# Patient Record
Sex: Female | Born: 1945 | Race: Black or African American | Hispanic: No | Smoking: Never smoker
Health system: Southern US, Community
[De-identification: ages and names within clinical notes are randomized; demographics above are authoritative.]

## PROBLEM LIST (undated history)

## (undated) DIAGNOSIS — J40 Bronchitis, not specified as acute or chronic: Secondary | ICD-10-CM

## (undated) DIAGNOSIS — J329 Chronic sinusitis, unspecified: Secondary | ICD-10-CM

## (undated) DIAGNOSIS — T7840XA Allergy, unspecified, initial encounter: Secondary | ICD-10-CM

## (undated) DIAGNOSIS — R51 Headache: Secondary | ICD-10-CM

## (undated) DIAGNOSIS — M152 Bouchard's nodes (with arthropathy): Secondary | ICD-10-CM

## (undated) DIAGNOSIS — J449 Chronic obstructive pulmonary disease, unspecified: Secondary | ICD-10-CM

## (undated) DIAGNOSIS — E785 Hyperlipidemia, unspecified: Secondary | ICD-10-CM

## (undated) DIAGNOSIS — M1710 Unilateral primary osteoarthritis, unspecified knee: Secondary | ICD-10-CM

## (undated) DIAGNOSIS — H269 Unspecified cataract: Secondary | ICD-10-CM

## (undated) DIAGNOSIS — H409 Unspecified glaucoma: Secondary | ICD-10-CM

## (undated) DIAGNOSIS — G709 Myoneural disorder, unspecified: Secondary | ICD-10-CM

## (undated) DIAGNOSIS — F419 Anxiety disorder, unspecified: Secondary | ICD-10-CM

## (undated) DIAGNOSIS — K589 Irritable bowel syndrome without diarrhea: Secondary | ICD-10-CM

## (undated) DIAGNOSIS — F32A Depression, unspecified: Secondary | ICD-10-CM

## (undated) DIAGNOSIS — K219 Gastro-esophageal reflux disease without esophagitis: Secondary | ICD-10-CM

## (undated) DIAGNOSIS — F329 Major depressive disorder, single episode, unspecified: Secondary | ICD-10-CM

## (undated) DIAGNOSIS — I10 Essential (primary) hypertension: Secondary | ICD-10-CM

## (undated) DIAGNOSIS — M179 Osteoarthritis of knee, unspecified: Secondary | ICD-10-CM

## (undated) DIAGNOSIS — M47817 Spondylosis without myelopathy or radiculopathy, lumbosacral region: Secondary | ICD-10-CM

## (undated) HISTORY — DX: Spondylosis without myelopathy or radiculopathy, lumbosacral region: M47.817

## (undated) HISTORY — DX: Unspecified cataract: H26.9

## (undated) HISTORY — DX: Osteoarthritis of knee, unspecified: M17.9

## (undated) HISTORY — DX: Major depressive disorder, single episode, unspecified: F32.9

## (undated) HISTORY — DX: Unspecified glaucoma: H40.9

## (undated) HISTORY — DX: Anxiety disorder, unspecified: F41.9

## (undated) HISTORY — DX: Essential (primary) hypertension: I10

## (undated) HISTORY — DX: Irritable bowel syndrome, unspecified: K58.9

## (undated) HISTORY — DX: Myoneural disorder, unspecified: G70.9

## (undated) HISTORY — DX: Depression, unspecified: F32.A

## (undated) HISTORY — DX: Bouchard's nodes (with arthropathy): M15.2

## (undated) HISTORY — PX: TONSILLECTOMY: SHX5217

## (undated) HISTORY — DX: Chronic obstructive pulmonary disease, unspecified: J44.9

## (undated) HISTORY — DX: Chronic sinusitis, unspecified: J32.9

## (undated) HISTORY — PX: CATARACT EXTRACTION: SUR2

## (undated) HISTORY — DX: Allergy, unspecified, initial encounter: T78.40XA

## (undated) HISTORY — DX: Headache: R51

## (undated) HISTORY — DX: Unilateral primary osteoarthritis, unspecified knee: M17.10

## (undated) HISTORY — DX: Gastro-esophageal reflux disease without esophagitis: K21.9

## (undated) HISTORY — DX: Hyperlipidemia, unspecified: E78.5

## (undated) HISTORY — PX: ORIF ANKLE FRACTURE: SHX5408

## (undated) HISTORY — DX: Bronchitis, not specified as acute or chronic: J40

---

## 1975-05-14 HISTORY — PX: TUBAL LIGATION: SHX77

## 1996-05-13 HISTORY — PX: BREAST EXCISIONAL BIOPSY: SUR124

## 1998-05-13 HISTORY — PX: SEPTOPLASTY: SHX2393

## 1999-05-02 ENCOUNTER — Emergency Department (HOSPITAL_COMMUNITY): Admission: EM | Admit: 1999-05-02 | Discharge: 1999-05-02 | Payer: Self-pay | Admitting: Emergency Medicine

## 2000-03-06 ENCOUNTER — Encounter: Payer: Self-pay | Admitting: Internal Medicine

## 2000-03-06 ENCOUNTER — Encounter: Admission: RE | Admit: 2000-03-06 | Discharge: 2000-03-06 | Payer: Self-pay | Admitting: Internal Medicine

## 2000-12-31 ENCOUNTER — Other Ambulatory Visit: Admission: RE | Admit: 2000-12-31 | Discharge: 2000-12-31 | Payer: Self-pay | Admitting: Obstetrics and Gynecology

## 2001-03-09 ENCOUNTER — Encounter: Admission: RE | Admit: 2001-03-09 | Discharge: 2001-03-09 | Payer: Self-pay | Admitting: Internal Medicine

## 2001-03-09 ENCOUNTER — Encounter: Payer: Self-pay | Admitting: Internal Medicine

## 2002-01-01 ENCOUNTER — Other Ambulatory Visit: Admission: RE | Admit: 2002-01-01 | Discharge: 2002-01-01 | Payer: Self-pay | Admitting: Obstetrics and Gynecology

## 2002-03-10 ENCOUNTER — Encounter: Admission: RE | Admit: 2002-03-10 | Discharge: 2002-03-10 | Payer: Self-pay | Admitting: Internal Medicine

## 2002-03-10 ENCOUNTER — Encounter: Payer: Self-pay | Admitting: Internal Medicine

## 2002-05-14 LAB — HM COLONOSCOPY

## 2003-01-05 ENCOUNTER — Other Ambulatory Visit: Admission: RE | Admit: 2003-01-05 | Discharge: 2003-01-05 | Payer: Self-pay | Admitting: Obstetrics and Gynecology

## 2003-03-11 ENCOUNTER — Encounter: Admission: RE | Admit: 2003-03-11 | Discharge: 2003-03-11 | Payer: Self-pay | Admitting: Obstetrics and Gynecology

## 2003-05-14 HISTORY — PX: OTHER SURGICAL HISTORY: SHX169

## 2003-09-12 ENCOUNTER — Ambulatory Visit (HOSPITAL_COMMUNITY): Admission: RE | Admit: 2003-09-12 | Discharge: 2003-09-12 | Payer: Self-pay | Admitting: Allergy

## 2004-02-15 ENCOUNTER — Other Ambulatory Visit: Admission: RE | Admit: 2004-02-15 | Discharge: 2004-02-15 | Payer: Self-pay | Admitting: Obstetrics and Gynecology

## 2004-03-14 ENCOUNTER — Ambulatory Visit: Payer: Self-pay | Admitting: Psychology

## 2004-03-26 ENCOUNTER — Encounter: Admission: RE | Admit: 2004-03-26 | Discharge: 2004-03-26 | Payer: Self-pay | Admitting: Obstetrics and Gynecology

## 2004-03-28 ENCOUNTER — Ambulatory Visit: Payer: Self-pay | Admitting: Psychology

## 2004-04-25 ENCOUNTER — Ambulatory Visit: Payer: Self-pay | Admitting: Psychology

## 2004-05-08 ENCOUNTER — Ambulatory Visit: Payer: Self-pay | Admitting: Internal Medicine

## 2004-05-15 ENCOUNTER — Ambulatory Visit: Payer: Self-pay | Admitting: Internal Medicine

## 2004-05-21 ENCOUNTER — Ambulatory Visit: Payer: Self-pay

## 2004-05-21 ENCOUNTER — Encounter: Payer: Self-pay | Admitting: Internal Medicine

## 2004-05-22 ENCOUNTER — Ambulatory Visit: Payer: Self-pay | Admitting: Internal Medicine

## 2004-05-23 ENCOUNTER — Ambulatory Visit: Payer: Self-pay | Admitting: Psychology

## 2004-06-05 ENCOUNTER — Ambulatory Visit: Payer: Self-pay | Admitting: Internal Medicine

## 2004-07-06 ENCOUNTER — Ambulatory Visit: Payer: Self-pay | Admitting: Internal Medicine

## 2004-11-06 ENCOUNTER — Ambulatory Visit: Payer: Self-pay | Admitting: Psychology

## 2004-11-06 ENCOUNTER — Ambulatory Visit: Payer: Self-pay | Admitting: Internal Medicine

## 2004-12-19 ENCOUNTER — Ambulatory Visit: Payer: Self-pay | Admitting: Psychology

## 2005-05-14 ENCOUNTER — Ambulatory Visit: Payer: Self-pay | Admitting: Internal Medicine

## 2005-05-17 ENCOUNTER — Other Ambulatory Visit: Admission: RE | Admit: 2005-05-17 | Discharge: 2005-05-17 | Payer: Self-pay | Admitting: Internal Medicine

## 2005-05-17 ENCOUNTER — Encounter: Payer: Self-pay | Admitting: Internal Medicine

## 2005-05-17 ENCOUNTER — Ambulatory Visit: Payer: Self-pay | Admitting: Internal Medicine

## 2005-05-27 ENCOUNTER — Ambulatory Visit: Payer: Self-pay | Admitting: Internal Medicine

## 2005-06-04 ENCOUNTER — Ambulatory Visit: Payer: Self-pay | Admitting: Internal Medicine

## 2005-08-23 ENCOUNTER — Encounter: Admission: RE | Admit: 2005-08-23 | Discharge: 2005-08-23 | Payer: Self-pay | Admitting: Internal Medicine

## 2005-10-08 ENCOUNTER — Ambulatory Visit: Payer: Self-pay | Admitting: Internal Medicine

## 2006-01-10 ENCOUNTER — Ambulatory Visit: Payer: Self-pay | Admitting: Internal Medicine

## 2006-02-10 ENCOUNTER — Encounter: Payer: Self-pay | Admitting: Internal Medicine

## 2006-02-10 LAB — CONVERTED CEMR LAB

## 2006-02-26 ENCOUNTER — Other Ambulatory Visit: Admission: RE | Admit: 2006-02-26 | Discharge: 2006-02-26 | Payer: Self-pay | Admitting: Obstetrics and Gynecology

## 2006-05-12 ENCOUNTER — Ambulatory Visit: Payer: Self-pay | Admitting: Internal Medicine

## 2006-05-12 LAB — CONVERTED CEMR LAB
ALT: 20 units/L (ref 0–40)
AST: 20 units/L (ref 0–37)
Albumin: 3.4 g/dL — ABNORMAL LOW (ref 3.5–5.2)
Alkaline Phosphatase: 60 units/L (ref 39–117)
BUN: 8 mg/dL (ref 6–23)
Bacteria, U Microscopic: NEGATIVE /hpf
Basophils Absolute: 0 10*3/uL (ref 0.0–0.1)
Basophils Relative: 0.4 % (ref 0.0–1.0)
Bilirubin Urine: NEGATIVE
CO2: 28 meq/L (ref 19–32)
Calcium: 8.7 mg/dL (ref 8.4–10.5)
Chloride: 109 meq/L (ref 96–112)
Chol/HDL Ratio, serum: 3.9
Cholesterol: 181 mg/dL (ref 0–200)
Creatinine, Ser: 1.2 mg/dL (ref 0.4–1.2)
Crystals: NEGATIVE
Eosinophil percent: 3.5 % (ref 0.0–5.0)
GFR calc non Af Amer: 49 mL/min
Glomerular Filtration Rate, Af Am: 59 mL/min/{1.73_m2}
Glucose, Bld: 96 mg/dL (ref 70–99)
HCT: 38.1 % (ref 36.0–46.0)
HDL: 46 mg/dL (ref >39.0)
Hemoglobin: 12 g/dL (ref 12.0–15.0)
Ketones, ur: NEGATIVE mg/dL
LDL Cholesterol: 124 mg/dL — ABNORMAL HIGH (ref 0–99)
Lymphocytes Relative: 41.1 % (ref 12.0–46.0)
MCHC: 31.6 g/dL (ref 30.0–36.0)
MCV: 75.6 fL — ABNORMAL LOW (ref 78.0–100.0)
Monocytes Absolute: 0.5 10*3/uL (ref 0.2–0.7)
Monocytes Relative: 12.2 % — ABNORMAL HIGH (ref 3.0–11.0)
Mucus, UA: NEGATIVE
Neutro Abs: 1.9 10*3/uL (ref 1.4–7.7)
Neutrophils Relative %: 42.8 % — ABNORMAL LOW (ref 43.0–77.0)
Nitrite: NEGATIVE
Platelets: 201 10*3/uL (ref 150–400)
Potassium: 3.9 meq/L (ref 3.5–5.1)
RBC / HPF: NONE SEEN
RBC: 5.04 M/uL (ref 3.87–5.11)
RDW: 14.8 % — ABNORMAL HIGH (ref 11.5–14.6)
Sodium: 141 meq/L (ref 135–145)
Specific Gravity, Urine: 1.015 (ref 1.000–1.03)
TSH: 2.36 microintl units/mL (ref 0.35–5.50)
Total Bilirubin: 0.6 mg/dL (ref 0.3–1.2)
Total Protein, Urine: NEGATIVE mg/dL
Total Protein: 6.7 g/dL (ref 6.0–8.3)
Triglyceride fasting, serum: 55 mg/dL (ref 0–149)
Urine Glucose: NEGATIVE mg/dL
Urobilinogen, UA: 0.2 (ref 0.0–1.0)
VLDL: 11 mg/dL (ref 0–40)
WBC: 4.4 10*3/uL — ABNORMAL LOW (ref 4.5–10.5)
pH: 6.5 (ref 5.0–8.0)

## 2006-05-19 ENCOUNTER — Ambulatory Visit: Payer: Self-pay | Admitting: Internal Medicine

## 2006-06-30 ENCOUNTER — Ambulatory Visit: Payer: Self-pay | Admitting: Psychology

## 2006-07-23 ENCOUNTER — Ambulatory Visit: Payer: Self-pay | Admitting: Psychology

## 2006-09-18 ENCOUNTER — Ambulatory Visit: Payer: Self-pay | Admitting: Psychology

## 2006-11-03 ENCOUNTER — Ambulatory Visit: Payer: Self-pay | Admitting: Internal Medicine

## 2006-11-03 LAB — CONVERTED CEMR LAB
BUN: 11 mg/dL (ref 6–23)
Basophils Absolute: 0 10*3/uL (ref 0.0–0.1)
Basophils Relative: 0.4 % (ref 0.0–1.0)
CO2: 29 meq/L (ref 19–32)
Calcium: 9.3 mg/dL (ref 8.4–10.5)
Chloride: 111 meq/L (ref 96–112)
Cholesterol: 192 mg/dL (ref 0–200)
Creatinine, Ser: 1.1 mg/dL (ref 0.4–1.2)
Eosinophils Absolute: 0.1 10*3/uL (ref 0.0–0.6)
Eosinophils Relative: 2.2 % (ref 0.0–5.0)
GFR calc Af Amer: 65 mL/min
GFR calc non Af Amer: 54 mL/min
Glucose, Bld: 89 mg/dL (ref 70–99)
HCT: 39.4 % (ref 36.0–46.0)
HDL: 49.4 mg/dL (ref >39.0)
Hemoglobin: 12.6 g/dL (ref 12.0–15.0)
LDL Cholesterol: 126 mg/dL — ABNORMAL HIGH (ref 0–99)
Lymphocytes Relative: 28.9 % (ref 12.0–46.0)
MCHC: 32 g/dL (ref 30.0–36.0)
MCV: 73.4 fL — ABNORMAL LOW (ref 78.0–100.0)
Monocytes Absolute: 0.5 10*3/uL (ref 0.2–0.7)
Monocytes Relative: 9.8 % (ref 3.0–11.0)
Neutro Abs: 3.2 10*3/uL (ref 1.4–7.7)
Neutrophils Relative %: 58.7 % (ref 43.0–77.0)
Platelets: 242 10*3/uL (ref 150–400)
Potassium: 4.1 meq/L (ref 3.5–5.1)
RBC: 5.37 M/uL — ABNORMAL HIGH (ref 3.87–5.11)
RDW: 14.1 % (ref 11.5–14.6)
Sodium: 143 meq/L (ref 135–145)
Total CHOL/HDL Ratio: 3.9
Triglycerides: 83 mg/dL (ref 0–149)
VLDL: 17 mg/dL (ref 0–40)
WBC: 5.3 10*3/uL (ref 4.5–10.5)

## 2006-11-05 ENCOUNTER — Ambulatory Visit: Payer: Self-pay | Admitting: Psychology

## 2006-12-01 ENCOUNTER — Encounter: Payer: Self-pay | Admitting: Internal Medicine

## 2006-12-01 DIAGNOSIS — R519 Headache, unspecified: Secondary | ICD-10-CM | POA: Insufficient documentation

## 2006-12-01 DIAGNOSIS — M19049 Primary osteoarthritis, unspecified hand: Secondary | ICD-10-CM | POA: Insufficient documentation

## 2006-12-01 DIAGNOSIS — J309 Allergic rhinitis, unspecified: Secondary | ICD-10-CM | POA: Insufficient documentation

## 2006-12-01 DIAGNOSIS — M1990 Unspecified osteoarthritis, unspecified site: Secondary | ICD-10-CM | POA: Insufficient documentation

## 2006-12-01 DIAGNOSIS — K589 Irritable bowel syndrome without diarrhea: Secondary | ICD-10-CM | POA: Insufficient documentation

## 2006-12-01 DIAGNOSIS — J329 Chronic sinusitis, unspecified: Secondary | ICD-10-CM | POA: Insufficient documentation

## 2006-12-01 DIAGNOSIS — M19079 Primary osteoarthritis, unspecified ankle and foot: Secondary | ICD-10-CM | POA: Insufficient documentation

## 2006-12-01 DIAGNOSIS — R51 Headache: Secondary | ICD-10-CM | POA: Insufficient documentation

## 2006-12-01 DIAGNOSIS — I10 Essential (primary) hypertension: Secondary | ICD-10-CM | POA: Insufficient documentation

## 2006-12-24 ENCOUNTER — Encounter: Admission: RE | Admit: 2006-12-24 | Discharge: 2006-12-24 | Payer: Self-pay | Admitting: Internal Medicine

## 2007-02-16 ENCOUNTER — Encounter: Payer: Self-pay | Admitting: Internal Medicine

## 2007-02-16 ENCOUNTER — Ambulatory Visit: Payer: Self-pay | Admitting: Internal Medicine

## 2007-04-22 ENCOUNTER — Ambulatory Visit: Payer: Self-pay | Admitting: Internal Medicine

## 2007-04-24 ENCOUNTER — Telehealth: Payer: Self-pay | Admitting: Internal Medicine

## 2007-07-16 ENCOUNTER — Ambulatory Visit: Payer: Self-pay | Admitting: Internal Medicine

## 2007-07-16 LAB — CONVERTED CEMR LAB
ALT: 25 units/L (ref 0–35)
AST: 26 units/L (ref 0–37)
Albumin: 3.7 g/dL (ref 3.5–5.2)
Alkaline Phosphatase: 72 units/L (ref 39–117)
BUN: 14 mg/dL (ref 6–23)
Basophils Absolute: 0 10*3/uL (ref 0.0–0.1)
Basophils Relative: 0.8 % (ref 0.0–1.0)
Bilirubin Urine: NEGATIVE
Bilirubin, Direct: 0.1 mg/dL (ref 0.0–0.3)
CO2: 27 meq/L (ref 19–32)
Calcium: 9 mg/dL (ref 8.4–10.5)
Chloride: 108 meq/L (ref 96–112)
Cholesterol: 182 mg/dL (ref 0–200)
Creatinine, Ser: 1.3 mg/dL — ABNORMAL HIGH (ref 0.4–1.2)
Eosinophils Absolute: 0.2 10*3/uL (ref 0.0–0.6)
Eosinophils Relative: 4 % (ref 0.0–5.0)
GFR calc Af Amer: 54 mL/min
GFR calc non Af Amer: 44 mL/min
Glucose, Bld: 98 mg/dL (ref 70–99)
HCT: 40.6 % (ref 36.0–46.0)
HDL: 57.7 mg/dL (ref >39.0)
Hemoglobin, Urine: NEGATIVE
Hemoglobin: 12.6 g/dL (ref 12.0–15.0)
Ketones, ur: NEGATIVE mg/dL
LDL Cholesterol: 110 mg/dL — ABNORMAL HIGH (ref 0–99)
Leukocytes, UA: NEGATIVE
Lymphocytes Relative: 28.4 % (ref 12.0–46.0)
MCHC: 31 g/dL (ref 30.0–36.0)
MCV: 73.6 fL — ABNORMAL LOW (ref 78.0–100.0)
Monocytes Absolute: 0.6 10*3/uL (ref 0.2–0.7)
Monocytes Relative: 11.7 % — ABNORMAL HIGH (ref 3.0–11.0)
Neutro Abs: 2.6 10*3/uL (ref 1.4–7.7)
Neutrophils Relative %: 55.1 % (ref 43.0–77.0)
Nitrite: NEGATIVE
Platelets: 217 10*3/uL (ref 150–400)
Potassium: 3.9 meq/L (ref 3.5–5.1)
RBC: 5.52 M/uL — ABNORMAL HIGH (ref 3.87–5.11)
RDW: 14.6 % (ref 11.5–14.6)
Sodium: 142 meq/L (ref 135–145)
Specific Gravity, Urine: 1.01 (ref 1.000–1.03)
TSH: 1.8 microintl units/mL (ref 0.35–5.50)
Total Bilirubin: 0.7 mg/dL (ref 0.3–1.2)
Total CHOL/HDL Ratio: 3.2
Total Protein, Urine: NEGATIVE mg/dL
Total Protein: 6.9 g/dL (ref 6.0–8.3)
Triglycerides: 72 mg/dL (ref 0–149)
Urine Glucose: NEGATIVE mg/dL
Urobilinogen, UA: 0.2 (ref 0.0–1.0)
VLDL: 14 mg/dL (ref 0–40)
WBC: 4.8 10*3/uL (ref 4.5–10.5)
pH: 6 (ref 5.0–8.0)

## 2007-07-23 ENCOUNTER — Encounter: Payer: Self-pay | Admitting: Internal Medicine

## 2007-07-23 ENCOUNTER — Other Ambulatory Visit: Admission: RE | Admit: 2007-07-23 | Discharge: 2007-07-23 | Payer: Self-pay | Admitting: Internal Medicine

## 2007-07-23 ENCOUNTER — Ambulatory Visit: Payer: Self-pay | Admitting: Internal Medicine

## 2007-07-29 ENCOUNTER — Encounter: Payer: Self-pay | Admitting: Internal Medicine

## 2007-10-26 ENCOUNTER — Telehealth: Payer: Self-pay | Admitting: Internal Medicine

## 2007-10-27 ENCOUNTER — Ambulatory Visit: Payer: Self-pay | Admitting: Internal Medicine

## 2007-10-27 DIAGNOSIS — J209 Acute bronchitis, unspecified: Secondary | ICD-10-CM | POA: Insufficient documentation

## 2007-10-27 DIAGNOSIS — J45909 Unspecified asthma, uncomplicated: Secondary | ICD-10-CM | POA: Insufficient documentation

## 2007-12-03 ENCOUNTER — Ambulatory Visit: Payer: Self-pay | Admitting: Internal Medicine

## 2007-12-04 DIAGNOSIS — F411 Generalized anxiety disorder: Secondary | ICD-10-CM | POA: Insufficient documentation

## 2007-12-10 ENCOUNTER — Ambulatory Visit: Payer: Self-pay | Admitting: Pulmonary Disease

## 2007-12-10 DIAGNOSIS — K219 Gastro-esophageal reflux disease without esophagitis: Secondary | ICD-10-CM | POA: Insufficient documentation

## 2007-12-25 ENCOUNTER — Encounter: Admission: RE | Admit: 2007-12-25 | Discharge: 2007-12-25 | Payer: Self-pay | Admitting: Internal Medicine

## 2007-12-29 ENCOUNTER — Encounter: Payer: Self-pay | Admitting: Internal Medicine

## 2008-01-04 ENCOUNTER — Encounter: Admission: RE | Admit: 2008-01-04 | Discharge: 2008-01-04 | Payer: Self-pay | Admitting: Gastroenterology

## 2008-01-04 ENCOUNTER — Encounter: Payer: Self-pay | Admitting: Internal Medicine

## 2008-01-27 ENCOUNTER — Encounter: Payer: Self-pay | Admitting: Internal Medicine

## 2008-02-23 ENCOUNTER — Ambulatory Visit: Payer: Self-pay | Admitting: Internal Medicine

## 2008-04-14 ENCOUNTER — Telehealth: Payer: Self-pay | Admitting: Internal Medicine

## 2008-07-18 ENCOUNTER — Ambulatory Visit: Payer: Self-pay | Admitting: Internal Medicine

## 2008-07-18 LAB — CONVERTED CEMR LAB
ALT: 23 units/L (ref 0–35)
AST: 30 units/L (ref 0–37)
Albumin: 3.8 g/dL (ref 3.5–5.2)
Alkaline Phosphatase: 68 units/L (ref 39–117)
BUN: 10 mg/dL (ref 6–23)
Bacteria, UA: NEGATIVE
Basophils Absolute: 0 10*3/uL (ref 0.0–0.1)
Basophils Relative: 0.9 % (ref 0.0–3.0)
Bilirubin Urine: NEGATIVE
Bilirubin, Direct: 0.1 mg/dL (ref 0.0–0.3)
CO2: 29 meq/L (ref 19–32)
Calcium: 9 mg/dL (ref 8.4–10.5)
Chloride: 107 meq/L (ref 96–112)
Cholesterol: 191 mg/dL (ref 0–200)
Creatinine, Ser: 1.1 mg/dL (ref 0.4–1.2)
Crystals: NEGATIVE
Eosinophils Absolute: 0.1 10*3/uL (ref 0.0–0.7)
Eosinophils Relative: 3.2 % (ref 0.0–5.0)
GFR calc Af Amer: 65 mL/min
GFR calc non Af Amer: 53 mL/min
Glucose, Bld: 84 mg/dL (ref 70–99)
HCT: 38.8 % (ref 36.0–46.0)
HDL: 49 mg/dL (ref >39.0)
Hemoglobin, Urine: NEGATIVE
Hemoglobin: 12.7 g/dL (ref 12.0–15.0)
Ketones, ur: NEGATIVE mg/dL
LDL Cholesterol: 130 mg/dL — ABNORMAL HIGH (ref 0–99)
Lymphocytes Relative: 43.5 % (ref 12.0–46.0)
MCHC: 32.7 g/dL (ref 30.0–36.0)
MCV: 73.1 fL — ABNORMAL LOW (ref 78.0–100.0)
Monocytes Absolute: 0.5 10*3/uL (ref 0.1–1.0)
Monocytes Relative: 11.5 % (ref 3.0–12.0)
Mucus, UA: NEGATIVE
Neutro Abs: 1.8 10*3/uL (ref 1.4–7.7)
Neutrophils Relative %: 40.9 % — ABNORMAL LOW (ref 43.0–77.0)
Nitrite: NEGATIVE
Platelets: 203 10*3/uL (ref 150–400)
Potassium: 4 meq/L (ref 3.5–5.1)
RBC: 5.31 M/uL — ABNORMAL HIGH (ref 3.87–5.11)
RDW: 14 % (ref 11.5–14.6)
Sodium: 143 meq/L (ref 135–145)
Specific Gravity, Urine: 1.02 (ref 1.000–1.035)
TSH: 1.54 microintl units/mL (ref 0.35–5.50)
Total Bilirubin: 0.8 mg/dL (ref 0.3–1.2)
Total CHOL/HDL Ratio: 3.9
Total Protein, Urine: NEGATIVE mg/dL
Total Protein: 7 g/dL (ref 6.0–8.3)
Triglycerides: 59 mg/dL (ref 0–149)
Urine Glucose: NEGATIVE mg/dL
Urobilinogen, UA: 0.2 (ref 0.0–1.0)
VLDL: 12 mg/dL (ref 0–40)
WBC: 4.3 10*3/uL — ABNORMAL LOW (ref 4.5–10.5)
pH: 6 (ref 5.0–8.0)

## 2008-07-25 ENCOUNTER — Ambulatory Visit: Payer: Self-pay | Admitting: Internal Medicine

## 2008-07-25 ENCOUNTER — Other Ambulatory Visit: Admission: RE | Admit: 2008-07-25 | Discharge: 2008-07-25 | Payer: Self-pay | Admitting: Internal Medicine

## 2008-07-25 ENCOUNTER — Encounter: Payer: Self-pay | Admitting: Internal Medicine

## 2008-07-25 LAB — HM PAP SMEAR

## 2008-08-01 ENCOUNTER — Telehealth: Payer: Self-pay | Admitting: Internal Medicine

## 2008-09-26 ENCOUNTER — Encounter: Payer: Self-pay | Admitting: Internal Medicine

## 2008-10-14 ENCOUNTER — Telehealth: Payer: Self-pay | Admitting: Internal Medicine

## 2008-11-03 ENCOUNTER — Ambulatory Visit: Payer: Self-pay | Admitting: Internal Medicine

## 2008-11-24 ENCOUNTER — Encounter: Admission: RE | Admit: 2008-11-24 | Discharge: 2008-11-24 | Payer: Self-pay | Admitting: Internal Medicine

## 2008-11-28 ENCOUNTER — Telehealth: Payer: Self-pay | Admitting: Internal Medicine

## 2008-11-30 ENCOUNTER — Ambulatory Visit: Payer: Self-pay | Admitting: Internal Medicine

## 2008-11-30 DIAGNOSIS — T887XXA Unspecified adverse effect of drug or medicament, initial encounter: Secondary | ICD-10-CM | POA: Insufficient documentation

## 2008-12-01 ENCOUNTER — Telehealth: Payer: Self-pay | Admitting: Internal Medicine

## 2009-01-04 ENCOUNTER — Telehealth: Payer: Self-pay | Admitting: Internal Medicine

## 2009-01-10 ENCOUNTER — Ambulatory Visit: Payer: Self-pay | Admitting: Internal Medicine

## 2009-01-26 ENCOUNTER — Ambulatory Visit: Payer: Self-pay | Admitting: Internal Medicine

## 2009-03-06 ENCOUNTER — Ambulatory Visit: Payer: Self-pay | Admitting: Internal Medicine

## 2009-04-12 ENCOUNTER — Encounter: Payer: Self-pay | Admitting: Internal Medicine

## 2009-04-24 ENCOUNTER — Telehealth: Payer: Self-pay | Admitting: Internal Medicine

## 2009-04-26 ENCOUNTER — Telehealth: Payer: Self-pay | Admitting: Internal Medicine

## 2009-07-19 ENCOUNTER — Ambulatory Visit: Payer: Self-pay | Admitting: Psychology

## 2009-07-19 ENCOUNTER — Ambulatory Visit: Payer: Self-pay | Admitting: Internal Medicine

## 2009-07-23 LAB — CONVERTED CEMR LAB
ALT: 19 units/L (ref 0–35)
AST: 25 units/L (ref 0–37)
Albumin: 3.9 g/dL (ref 3.5–5.2)
Alkaline Phosphatase: 71 units/L (ref 39–117)
BUN: 9 mg/dL (ref 6–23)
Basophils Absolute: 0 10*3/uL (ref 0.0–0.1)
Basophils Relative: 0.6 % (ref 0.0–3.0)
Bilirubin Urine: NEGATIVE
Bilirubin, Direct: 0.1 mg/dL (ref 0.0–0.3)
CO2: 29 meq/L (ref 19–32)
Calcium: 9.2 mg/dL (ref 8.4–10.5)
Chloride: 110 meq/L (ref 96–112)
Cholesterol: 226 mg/dL — ABNORMAL HIGH (ref 0–200)
Creatinine, Ser: 1.1 mg/dL (ref 0.4–1.2)
Direct LDL: 150.8 mg/dL
Eosinophils Absolute: 0.1 10*3/uL (ref 0.0–0.7)
Eosinophils Relative: 3.1 % (ref 0.0–5.0)
GFR calc non Af Amer: 64.4 mL/min (ref >60)
Glucose, Bld: 92 mg/dL (ref 70–99)
HCT: 39.2 % (ref 36.0–46.0)
HDL: 59.5 mg/dL (ref >39.00)
Hemoglobin, Urine: NEGATIVE
Hemoglobin: 12.1 g/dL (ref 12.0–15.0)
Ketones, ur: NEGATIVE mg/dL
Lymphocytes Relative: 37.7 % (ref 12.0–46.0)
Lymphs Abs: 1.5 10*3/uL (ref 0.7–4.0)
MCHC: 30.9 g/dL (ref 30.0–36.0)
MCV: 75.3 fL — ABNORMAL LOW (ref 78.0–100.0)
Monocytes Absolute: 0.5 10*3/uL (ref 0.1–1.0)
Monocytes Relative: 11.9 % (ref 3.0–12.0)
Neutro Abs: 1.9 10*3/uL (ref 1.4–7.7)
Neutrophils Relative %: 46.7 % (ref 43.0–77.0)
Nitrite: NEGATIVE
Platelets: 207 10*3/uL (ref 150.0–400.0)
Potassium: 3.8 meq/L (ref 3.5–5.1)
RBC: 5.2 M/uL — ABNORMAL HIGH (ref 3.87–5.11)
RDW: 14.9 % — ABNORMAL HIGH (ref 11.5–14.6)
Sodium: 143 meq/L (ref 135–145)
Specific Gravity, Urine: 1.015 (ref 1.000–1.030)
TSH: 2.49 microintl units/mL (ref 0.35–5.50)
Total Bilirubin: 0.5 mg/dL (ref 0.3–1.2)
Total CHOL/HDL Ratio: 4
Total Protein, Urine: NEGATIVE mg/dL
Total Protein: 7.6 g/dL (ref 6.0–8.3)
Triglycerides: 94 mg/dL (ref 0.0–149.0)
Urine Glucose: NEGATIVE mg/dL
Urobilinogen, UA: 0.2 (ref 0.0–1.0)
VLDL: 18.8 mg/dL (ref 0.0–40.0)
WBC: 4 10*3/uL — ABNORMAL LOW (ref 4.5–10.5)
pH: 6 (ref 5.0–8.0)

## 2009-07-26 ENCOUNTER — Ambulatory Visit: Payer: Self-pay | Admitting: Internal Medicine

## 2009-07-27 ENCOUNTER — Telehealth (INDEPENDENT_AMBULATORY_CARE_PROVIDER_SITE_OTHER): Payer: Self-pay | Admitting: *Deleted

## 2009-07-28 ENCOUNTER — Telehealth: Payer: Self-pay | Admitting: Internal Medicine

## 2009-08-14 ENCOUNTER — Telehealth: Payer: Self-pay | Admitting: Internal Medicine

## 2009-08-15 ENCOUNTER — Encounter: Payer: Self-pay | Admitting: Internal Medicine

## 2009-08-15 ENCOUNTER — Ambulatory Visit: Payer: Self-pay | Admitting: Internal Medicine

## 2009-09-25 ENCOUNTER — Encounter: Payer: Self-pay | Admitting: Internal Medicine

## 2009-09-27 ENCOUNTER — Encounter: Payer: Self-pay | Admitting: Internal Medicine

## 2009-09-27 ENCOUNTER — Telehealth: Payer: Self-pay | Admitting: Internal Medicine

## 2009-10-11 ENCOUNTER — Encounter: Payer: Self-pay | Admitting: Internal Medicine

## 2009-11-27 ENCOUNTER — Encounter: Admission: RE | Admit: 2009-11-27 | Discharge: 2009-11-27 | Payer: Self-pay | Admitting: Internal Medicine

## 2009-11-27 LAB — HM MAMMOGRAPHY: HM Mammogram: NEGATIVE

## 2010-01-02 ENCOUNTER — Telehealth: Payer: Self-pay | Admitting: Internal Medicine

## 2010-01-16 ENCOUNTER — Ambulatory Visit: Payer: Self-pay | Admitting: Internal Medicine

## 2010-01-16 LAB — CONVERTED CEMR LAB
Cholesterol: 206 mg/dL — ABNORMAL HIGH (ref 0–200)
Direct LDL: 145.7 mg/dL
HDL: 50.1 mg/dL (ref >39.00)
Total CHOL/HDL Ratio: 4
Triglycerides: 57 mg/dL (ref 0.0–149.0)
VLDL: 11.4 mg/dL (ref 0.0–40.0)

## 2010-01-19 ENCOUNTER — Encounter (INDEPENDENT_AMBULATORY_CARE_PROVIDER_SITE_OTHER): Payer: Self-pay | Admitting: *Deleted

## 2010-01-19 ENCOUNTER — Ambulatory Visit: Payer: Self-pay | Admitting: Internal Medicine

## 2010-01-19 DIAGNOSIS — E789 Disorder of lipoprotein metabolism, unspecified: Secondary | ICD-10-CM | POA: Insufficient documentation

## 2010-01-25 ENCOUNTER — Ambulatory Visit: Payer: Self-pay | Admitting: Internal Medicine

## 2010-01-30 ENCOUNTER — Telehealth: Payer: Self-pay | Admitting: Internal Medicine

## 2010-02-08 ENCOUNTER — Ambulatory Visit: Payer: Self-pay | Admitting: Internal Medicine

## 2010-02-09 ENCOUNTER — Telehealth: Payer: Self-pay | Admitting: Internal Medicine

## 2010-02-22 ENCOUNTER — Encounter (INDEPENDENT_AMBULATORY_CARE_PROVIDER_SITE_OTHER): Payer: Self-pay | Admitting: *Deleted

## 2010-02-23 ENCOUNTER — Ambulatory Visit: Payer: Self-pay | Admitting: Internal Medicine

## 2010-02-28 ENCOUNTER — Ambulatory Visit: Payer: Self-pay | Admitting: Internal Medicine

## 2010-05-21 ENCOUNTER — Telehealth: Payer: Self-pay | Admitting: Internal Medicine

## 2010-06-10 LAB — CONVERTED CEMR LAB

## 2010-06-12 NOTE — Assessment & Plan Note (Signed)
Summary: bp check per md-lb  Nurse Visit   Vital Signs:  Patient profile:   65 year old female BP sitting:   148 / 80  (left arm) Cuff size:   regular CC: Nurse visit for BP check/ ab   Allergies: 1)  ! Bactrim 2)  ! Enalapril Maleate

## 2010-06-12 NOTE — Progress Notes (Signed)
  Phone Note Refill Request Message from:  Fax from Pharmacy on February 09, 2010 10:37 AM  Refills Requested: Medication #1:  ALPRAZOLAM 0.5 MG  TABS 1 by mouth three times a day prn   Last Refilled: 12/01/2008 Fax from [facility] [address], Please Advise refill  Initial call taken by: Ami Bullins CMA,  February 09, 2010 10:38 AM  Follow-up for Phone Call        ok to refill with 5 add'l Follow-up by: Jacques Navy MD,  February 09, 2010 1:01 PM    Prescriptions: ALPRAZOLAM 0.5 MG  TABS (ALPRAZOLAM) 1 by mouth three times a day prn  #90 x 5   Entered by:   Ami Bullins CMA   Authorized by:   Jacques Navy MD   Signed by:   Bill Salinas CMA on 02/09/2010   Method used:   Telephoned to ...       [facility] (retail)       [address]       [place], [place]  [address]       Ph: [phone]       Fax: [phone]   RxID:   [id number]

## 2010-06-12 NOTE — Progress Notes (Signed)
Summary: PA--Omeprazole  Phone Note From Pharmacy   Summary of Call: PA request--Omeprazole. Done via phone and approved x1 year. Initial call taken by: Lucious Groves,  Sep 27, 2009 3:24 PM

## 2010-06-12 NOTE — Assessment & Plan Note (Signed)
Summary: CPX//  JT  $50   Vital Signs:  Patient Profile:   65 Years Old Female Weight:      190 pounds Temp:     98.6 degrees F oral Pulse rate:   68 / minute BP sitting:   146 / 82  (left arm) Cuff size:   large  Vitals Entered By: Zackery Barefoot CMA (July 23, 2007 10:37 AM)                 PCP:  Norins  Chief Complaint:  CPX/PAP.  History of Present Illness: having a seasonal allergy flare. Thought she had a sinus infection, went to [facility] and was tx'd with amoxicillin. She has seen Dr. Corinda Gubler recently for bronchitis treated with Avelox for 10 days. About a week later went to see Dr.Sharma, had a neb treated, solumedrol and 1 week of prednisone and 14 days of Avelox.    Updated Prior Medication List: SYMBICORT 160-4.5 MCG/ACT  AERO (BUDESONIDE-FORMOTEROL FUMARATE) 2 puffs two times a day PULMICORT 0.25 MG/2ML  SUSP (BUDESONIDE (INHALATION)) nebulizer as needed XOPENEX 1.25 MG/3ML  NEBU (LEVALBUTEROL HCL) nebulizer as needed DIOVAN 320 MG  TABS (VALSARTAN) 1 once daily ASTEPRO 137 MCG/SPRAY  SOLN (AZELASTINE HCL) AS DIRECTED ZYRTEC ALLERGY 10 MG  TABS (CETIRIZINE HCL) 1  once daily ACCOLATE 20 MG  TABS (ZAFIRLUKAST) 1 once daily NEXIUM 40 MG  CPDR (ESOMEPRAZOLE MAGNESIUM) 1 two times a day FUROSEMIDE 40 MG  TABS (FUROSEMIDE) 1 once daily ZOLOFT 100 MG  TABS (SERTRALINE HCL) 1 1/2 once daily AEROHIST 8-2.5 MG  TB12 (CHLORPHENIRAMINE-METHSCOP) 1 two times a day NASAREL 29 MCG/ACT  SOLN (FLUNISOLIDE) once daily MOTRIN IB 200 MG  TABS (IBUPROFEN) 800 mg as needed XANAX 0.25 MG  TABS (ALPRAZOLAM) 1 as needed LIBRAX 2.5-5 MG  CAPS (CLIDINIUM-CHLORDIAZEPOXIDE) 1 three times a day * IMITREX as needed PROVENTIL HFA 108 (90 BASE) MCG/ACT  AERS (ALBUTEROL SULFATE) as needed * MULTIVITAMIN once daily * IRON daily * VITAMIN C daily * B12 daily MUCINEX 600 MG  TB12 (GUAIFENESIN) Take 1 tablet by mouth once a day KLOR-CON 8 MEQ TBCR (POTASSIUM CHLORIDE) Take 1 tablet by  mouth once a day SPIRIVA HANDIHALER 18 MCG  CAPS (TIOTROPIUM BROMIDE MONOHYDRATE) every morning  Current Allergies: ! BACTRIM  Past Medical History:    Reviewed history from 12/01/2006 and no changes required:       Allergic rhinitis       Headache       Hypertension  Past Surgical History:    Reviewed history from 12/01/2006 and no changes required:       Lumpectomy       Tubal ligation       Tonsillectomy       septoplasty       transnasal laryngoscopy   Family History:    father - deceased @ 89: CAD/MI fatal, HTN    mother- 49; good health    Neg- beast or colon cancer, DM  Social History:    [name] -BS, has had additional course work    married '71- after 29 years husband left, divorce final in '06    1 son- '74, 1 daughter '76; 1 grandchild    work: Technical sales engineer of [place]    Lives alone    Risk Factors:  Alcohol use:  no Exercise:  no Seatbelt use:  100 %  Mammogram History:     Date of Last Mammogram:  12/24/2006    Results:  Normal Bilateral  Review of Systems       The patient complains of fever, hoarseness, dyspnea on exhertion, and prolonged cough.  The patient denies anorexia, weight loss, decreased hearing, chest pain, syncope, peripheral edema, hemoptysis, abdominal pain, severe indigestion/heartburn, muscle weakness, depression, and breast masses.     Physical Exam  General:     alert, well-developed, well-nourished, well-hydrated, appropriate dress, normal appearance, and overweight-appearing.   Head:     normocephalic, atraumatic, no abnormalities observed, and no abnormalities palpated.   Eyes:     vision grossly intact, pupils equal, pupils round, pupils reactive to light, corneas and lenses clear, and no injection.   Ears:     External ear exam shows no significant lesions or deformities.  Otoscopic examination reveals clear canals, tympanic membranes are intact bilaterally without bulging, retraction, inflammation or discharge. Hearing is  grossly normal bilaterally. Mouth:     Oral mucosa and oropharynx without lesions or exudates.  Teeth in good repair. Neck:     supple, full ROM, no masses, no thyromegaly, no JVD, and no HJR.   Breasts:     skin/areolae normal, no masses, no abnormal thickening, no nipple discharge, and no tenderness.   Lungs:     decreased breath sounds, tight, exp wheeze, no intercostal retractions and no accessory muscle use.   Heart:     normal rate, regular rhythm, no murmur, no gallop, no JVD, no HJR, and no lifts.   Abdomen:     Bowel sounds positive,abdomen soft and non-tender without masses, organomegaly or hernias noted. Genitalia:     Pelvic Exam:        External: normal female genitalia without lesions or masses        Vagina: normal without lesions or masses        Cervix: normal without lesions or masses        Adnexa: normal bimanual exam without masses or fullness        Uterus: normal by palpation        Pap smear: performed Msk:     normal ROM, no joint tenderness, no joint swelling, no joint warmth, no redness over joints, no joint deformities, no joint instability, and no crepitation.   Pulses:     R and L carotid,radial,femoral,dorsalis pedis and posterior tibial pulses are full and equal bilaterally Extremities:     No clubbing, cyanosis, edema, or deformity noted with normal full range of motion of all joints.   Neurologic:     alert & oriented X3, cranial nerves II-XII intact, strength normal in all extremities, sensation intact to light touch, sensation intact to pinprick, gait normal, DTRs symmetrical and normal, and toes down bilaterally on Babinski.   Skin:     Intact without suspicious lesions or rashes Cervical Nodes:     No lymphadenopathy noted Axillary Nodes:     No palpable lymphadenopathy Psych:     Cognition and judgment appear intact. Alert and cooperative with normal attention span and concentration. 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 Refill provided.  Problem # 3:  HYPERTENSION (ICD-401.9)  Her updated medication list for this problem includes:    Diovan 320 Mg Tabs (Valsartan) .Marland KitchenMarland KitchenMarland KitchenMarland Kitchen  1 once daily    Furosemide 40 Mg Tabs (Furosemide) .Marland Kitchen... 1 once daily  BP today: 146/82 Prior BP: 155/85 (04/22/2007)  Labs Reviewed: Creat: 1.3 (07/16/2007) Chol: 182 (07/16/2007)   HDL: 57.7 (07/16/2007)   LDL: 110 (07/16/2007)   TG: 72 (07/16/2007)  BP running a little high. Plan: check BP at home. IF continue to be 140 or great will need to adjust medication.   Problem # 4:  HEADACHE (ICD-784.0) Stable. Refill on imitrex proviced.  Her updated medication list for this problem includes:    Motrin Ib 200 Mg Tabs (Ibuprofen) .Marland KitchenMarland KitchenMarland KitchenMarland Kitchen 800 mg as needed   Problem # 5:  ALLERGIC RHINITIS (ICD-477.9) Seems a little tight today. I have asked that she use her peak flow meter at home today and if in the yellow or red zone to see Dr. [place] Callas.  Her updated medication list for this problem includes:    Astepro 137 Mcg/spray Soln (Azelastine hcl) .Marland Kitchen... As directed    Zyrtec Allergy 10 Mg Tabs (Cetirizine hcl) .Marland Kitchen... 1  once daily    Aerohist 8-2.5 Mg Tb12 (Chlorpheniramine-methscop) .Marland Kitchen... 1 two times a day    Nasarel 29 Mcg/act Soln (Flunisolide) ..... Once daily   Problem # 6:  Screening Cervical Cancer (ICD-V76.2) Normal exam today with lab pending.  Problem # 7:  Screening Breast Cancer (ICD-V76.10) aup to date with mammograms. Normal exam today.  Complete Medication List: 1)  Symbicort 160-4.5 Mcg/act Aero (Budesonide-formoterol fumarate) .... 2 puffs two times a day 2)  Pulmicort 0.25 Mg/37ml Susp  (Budesonide (inhalation)) .... Nebulizer as needed 3)  Xopenex 1.25 Mg/68ml Nebu (Levalbuterol hcl) .... Nebulizer as needed 4)  Diovan 320 Mg Tabs (Valsartan) .Marland Kitchen.. 1 once daily 5)  Astepro 137 Mcg/spray Soln (Azelastine hcl) .... As directed 6)  Zyrtec Allergy 10 Mg Tabs (Cetirizine hcl) .Marland Kitchen.. 1  once daily 7)  Accolate 20 Mg Tabs (Zafirlukast) .Marland Kitchen.. 1 once daily 8)  Nexium 40 Mg Cpdr (Esomeprazole magnesium) .Marland Kitchen.. 1 two times a day 9)  Furosemide 40 Mg Tabs (Furosemide) .Marland Kitchen.. 1 once daily 10)  Zoloft 100 Mg Tabs (Sertraline hcl) .Marland Kitchen.. 1 1/2 once daily 11)  Aerohist 8-2.5 Mg Tb12 (Chlorpheniramine-methscop) .Marland Kitchen.. 1 two times a day 12)  Nasarel 29 Mcg/act Soln (Flunisolide) .... Once daily 13)  Motrin Ib 200 Mg Tabs (Ibuprofen) .... 800 mg as needed 14)  Xanax 0.25 Mg Tabs (Alprazolam) .Marland Kitchen.. 1 as needed 15)  Librax 2.5-5 Mg Caps (Clidinium-chlordiazepoxide) .Marland Kitchen.. 1 three times a day 16)  Imitrex  .... As needed 17)  Proventil Hfa 108 (90 Base) Mcg/act Aers (Albuterol sulfate) .... As needed 18)  Multivitamin  .... Once daily 19)  Iron  .... Daily 20)  Vitamin C  .... Daily 21)  B12  .... Daily 22)  Mucinex 600 Mg Tb12 (Guaifenesin) .... Take 1 tablet by mouth once a day 23)  Klor-con 8 Meq Tbcr (Potassium chloride) .... Take 1 tablet by mouth once a day 24)  Spiriva Handihaler 18 Mcg Caps (Tiotropium bromide monohydrate) .... Every morning     Prescriptions: ACCOLATE 20 MG  TABS (ZAFIRLUKAST) 1 once daily  #30 x 12   Entered and Authorized by:   Jacques Navy MD   Signed by:   Jacques Navy MD on 07/23/2007   Method used:   Print then Give to Patient   RxID:   [id number] XANAX 0.25 MG  TABS (ALPRAZOLAM) 1 as needed  #90 x 5   Entered and Authorized by:  Jacques Navy MD   Signed by:   Jacques Navy MD on 07/23/2007   Method used:   Print then Give to Patient   RxID:   [id number] FAOZHYQ as needed  #6 x 12   Entered and Authorized by:   Jacques Navy MD    Signed by:   Jacques Navy MD on 07/23/2007   Method used:   Print then Give to Patient   RxID:   [id number] LIBRAX 2.5-5 MG  CAPS (CLIDINIUM-CHLORDIAZEPOXIDE) 1 three times a day  #90 x 12   Entered and Authorized by:   Jacques Navy MD   Signed by:   Jacques Navy MD on 07/23/2007   Method used:   Print then Give to Patient   RxID:   [id number]  ]

## 2010-06-12 NOTE — Letter (Signed)
   [facility] [address] [place], [place]  [address] Phone: [phone]      Sep 25, 2009   Jo-Ann Mummert [address] [place], [place] [address]  RE:  LAB RESULTS  Dear  Ms. Schnapp,  The following is an interpretation of your most recent lab tests.  Please take note of any instructions provided or changes to medications that have resulted from your lab work.     Bone density study reveals normal bone density. Repeat study in 5 years   Call or e-mail me if you have questions ([email or web address]).   Sincerely Yours,    Jacques Navy MD

## 2010-06-12 NOTE — Consult Note (Signed)
[facility]   Imported By: Lester [place] 02/12/2008 11:29:44  _____________________________________________________________________  External Attachment:    Type:   Image     Comment:   External Document

## 2010-06-12 NOTE — Progress Notes (Signed)
  Phone Note Outgoing Call   Reason for Call: Discuss lab or test results Summary of Call: please call patient. PAP normal.  Thanks Initial call taken by: Jacques Navy MD,  August 01, 2008 8:37 AM  Follow-up for Phone Call        Pt advised and is requesting copy of OV and labs. Okay to send same?  Follow-up by: Zackery Barefoot CMA,  August 01, 2008 2:24 PM

## 2010-06-12 NOTE — Progress Notes (Signed)
  Phone Note Refill Request Message from:  Fax from Pharmacy on April 26, 2009 9:33 AM  Refills Requested: Medication #1:  IBUPROFEN 800 MG TABS Take 1 tablet by mouth once a day as needed   Last Refilled: 04/14/2008 can pt have refill? Please Advise  Initial call taken by: Ami Bullins CMA,  April 26, 2009 9:33 AM  Follow-up for Phone Call        OK for as needed refills Follow-up by: Jacques Navy MD,  April 26, 2009 1:08 PM    Prescriptions: IBUPROFEN 800 MG TABS (IBUPROFEN) Take 1 tablet by mouth once a day as needed  #90 x 0   Entered by:   Ami Bullins CMA   Authorized by:   Jacques Navy MD   Signed by:   Bill Salinas CMA on 04/26/2009   Method used:   Telephoned to ...       [facility] (retail)       [address]       [place], [place]  [address]       Ph: [phone]       Fax: [phone]   RxID:   [id number]

## 2010-06-12 NOTE — Assessment & Plan Note (Signed)
Summary: DISCUSS MEDS/NWS #   Vital Signs:  Patient profile:   65 year old female Height:      66 inches Weight:      194 pounds BMI:     31.43 O2 Sat:      99 % on Room air Temp:     98.1 degrees F oral Pulse rate:   63 / minute BP sitting:   140 / 94  (left arm) Cuff size:   regular  Vitals Entered By: Alysia Penna (February 08, 2010 4:02 PM)  O2 Flow:  Room air CC: pt here to discuss changing Exforge. Had an allergic reaction. /cp sma Comments Pt has stopped taking Exforge, Medroxyprogesterone, and Estradiol.    Primary Care Provider:  Norins  CC:  pt here to discuss changing Exforge. Had an allergic reaction. /cp sma.  History of Present Illness: Patient returns for BP management. she at the last visit was started on Exforge due to poorly controlled BP. She had multiple side effects: nausea, diarrhea, felt cold. BP dropped to 100/63. She stopped medication and she feels better.   Chart reviewed:              3/16          01/26/09          11/11/08           6/24         3/15         12/09/07                                        142/92        138/84           160/94         160/70     160/110     154/90  Prior to last visit patient was controlled on losartan 100mg  and furosemide 40mg   Current Medications (verified): 1)  Symbicort 160-4.5 Mcg/act  Aero (Budesonide-Formoterol Fumarate) .... 2 Puffs Two Times A Day 2)  Xopenex 1.25 Mg/72ml  Nebu (Levalbuterol Hcl) .... Nebulizer As Needed 3)  Exforge 5-160 Mg Tabs (Amlodipine Besylate-Valsartan) .Marland Kitchen.. 1 By Mouth Once Daily -Hold X 5 Days(01/30/2010) 4)  Astepro 137 Mcg/spray  Soln (Azelastine Hcl) .... As Directed 5)  Xyzal 5 Mg Tabs (Levocetirizine Dihydrochloride) .Marland Kitchen.. 1 Tablet Daily 6)  Accolate 20 Mg  Tabs (Zafirlukast) .... As Needed 7)  Omeprazole 40 Mg Cpdr (Omeprazole) .Marland Kitchen.. 1 By Mouth Once Daily 8)  Zoloft 100 Mg  Tabs (Sertraline Hcl) .Marland Kitchen.. 1 1/2 Once Daily 9)  Fluticasone Propionate 50 Mcg/act Susp (Fluticasone  Propionate) .Marland Kitchen.. 1 Spray/nares Once Daily 10)  Ibuprofen 800 Mg Tabs (Ibuprofen) .... Take 1 Tablet By Mouth Once A Day As Needed 11)  Alprazolam 0.5 Mg  Tabs (Alprazolam) .Marland Kitchen.. 1 By Mouth Three Times A Day Prn 12)  Librax 2.5-5 Mg  Caps (Clidinium-Chlordiazepoxide) .Marland Kitchen.. 1 Three Times A Day 13)  Imitrex .... As Needed 14)  Multivitamin .... Once Daily 15)  Iron .... Daily 16)  Vitamin C .... Daily 17)  B12 .... Daily 18)  Mucinex 600 Mg  Tb12 (Guaifenesin) .... Take 1 Tablet By Mouth Once A Day 19)  Medroxyprogesterone Acetate 2.5 Mg  Tabs (Medroxyprogesterone Acetate) .... Take One By Mouth As Needed 20)  Estradiol 1 Mg  Tabs (Estradiol) .... Take One Tablet By Mouth  Daily. 21)  Xalatan 0.005 % Soln (Latanoprost) .Marland Kitchen.. 1 Drop Both Eyes Daily 22)  Klor-Con 8 Meq Cr-Tabs (Potassium Chloride) .Marland Kitchen.. 1 By Mouth Once Daily 23)  Furosemide 40 Mg Tabs (Furosemide) .Marland Kitchen.. 1 By Mouth Once Daily  Allergies (verified): 1)  ! Bactrim 2)  ! Enalapril Maleate  Past History:  Past Medical History: Last updated: 10/27/2007 Allergic rhinitis Headache Hypertension Asthma Recurrent bronchitis  Past Surgical History: Last updated: 07/26/2009 Lumpectomy Tubal ligation Tonsillectomy septoplasty transnasal laryngoscopy ORIF left ankle (done in [place]) '07 PSH reviewed for relevance, FH reviewed for relevance  Review of Systems  The patient denies anorexia, fever, weight loss, weight gain, decreased hearing, chest pain, dyspnea on exertion, prolonged cough, abdominal pain, muscle weakness, depression, and angioedema.    Physical Exam  General:  overweight AA woman in no distress Head:  normocephalic and no abnormalities observed.   Eyes:  pupils equal and pupils round.   Lungs:  normal respiratory effort, normal breath sounds, and no wheezes.   Heart:  normal rate and regular rhythm.   Neurologic:  alert & oriented X3, cranial nerves II-XII intact, and gait normal.     Impression &  Recommendations:  Problem # 1:  HYPERTENSION (ICD-401.9) Patient intolerant of Exforge. She does well with losartan and furosemide. BP is pretty good today.  Plan - resume losartan 100 and continue furosemide 40mg   Her updated medication list for this problem includes:    Losartan Potassium 100 Mg Tabs (Losartan potassium) .Marland Kitchen... 1 by mouth once daily    Furosemide 40 Mg Tabs (Furosemide) .Marland Kitchen... 1 by mouth once daily  Complete Medication List: 1)  Symbicort 160-4.5 Mcg/act Aero (Budesonide-formoterol fumarate) .... 2 puffs two times a day 2)  Xopenex 1.25 Mg/37ml Nebu (Levalbuterol hcl) .... Nebulizer as needed 3)  Losartan Potassium 100 Mg Tabs (Losartan potassium) .Marland Kitchen.. 1 by mouth once daily 4)  Astepro 137 Mcg/spray Soln (Azelastine hcl) .... As directed 5)  Xyzal 5 Mg Tabs (Levocetirizine dihydrochloride) .Marland Kitchen.. 1 tablet daily 6)  Accolate 20 Mg Tabs (Zafirlukast) .... As needed 7)  Omeprazole 40 Mg Cpdr (Omeprazole) .Marland Kitchen.. 1 by mouth once daily 8)  Zoloft 100 Mg Tabs (Sertraline hcl) .Marland Kitchen.. 1 1/2 once daily 9)  Fluticasone Propionate 50 Mcg/act Susp (Fluticasone propionate) .Marland Kitchen.. 1 spray/nares once daily 10)  Ibuprofen 800 Mg Tabs (Ibuprofen) .... Take 1 tablet by mouth once a day as needed 11)  Alprazolam 0.5 Mg Tabs (Alprazolam) .Marland Kitchen.. 1 by mouth three times a day prn 12)  Librax 2.5-5 Mg Caps (Clidinium-chlordiazepoxide) .Marland Kitchen.. 1 three times a day 13)  Imitrex  .... As needed 14)  Multivitamin  .... Once daily 15)  Iron  .... Daily 16)  Vitamin C  .... Daily 17)  B12  .... Daily 18)  Mucinex 600 Mg Tb12 (Guaifenesin) .... Take 1 tablet by mouth once a day 19)  Medroxyprogesterone Acetate 2.5 Mg Tabs (Medroxyprogesterone acetate) .... Take one by mouth as needed 20)  Estradiol 1 Mg Tabs (Estradiol) .... Take one tablet by mouth daily. 21)  Xalatan 0.005 % Soln (Latanoprost) .Marland Kitchen.. 1 drop both eyes daily 22)  Klor-con 8 Meq Cr-tabs (Potassium chloride) .Marland Kitchen.. 1 by mouth once daily 23)   Furosemide 40 Mg Tabs (Furosemide) .Marland Kitchen.. 1 by mouth once daily

## 2010-06-12 NOTE — Assessment & Plan Note (Signed)
Summary: men/flu/cd  Nurse Visit   Allergies: 1)  ! Bactrim 2)  ! Enalapril Maleate  Orders Added: 1)  Admin 1st Vaccine [90471] 2)  Flu Vaccine 59yrs + [[address]56] Flu Vaccine Consent Questions     Do you have a history of severe allergic reactions to this vaccine? no    Any prior history of allergic reactions to egg and/or gelatin? no    Do you have a sensitivity to the preservative Thimersol? no    Do you have a past history of Guillan-Barre Syndrome? no    Do you currently have an acute febrile illness? no    Have you ever had a severe reaction to latex? no    Vaccine information given and explained to patient? yes    Are you currently pregnant? no    Lot Number:[id number]   Exp Date:11/09/2009   Site Given  Left Deltoid IMu

## 2010-06-12 NOTE — Miscellaneous (Signed)
Summary: [facility] Previsit/prep  Clinical Lists Changes  Medications: Added new medication of DULCOLAX 5 MG  TBEC (BISACODYL) Day before procedure take 2 at 3pm and 2 at 8pm. - Signed Added new medication of METOCLOPRAMIDE HCL 10 MG  TABS (METOCLOPRAMIDE HCL) As per prep instructions. - Signed Added new medication of MIRALAX   POWD (POLYETHYLENE GLYCOL 3350) As per prep  instructions. - Signed Rx of DULCOLAX 5 MG  TBEC (BISACODYL) Day before procedure take 2 at 3pm and 2 at 8pm.;  #4 x 0;  Signed;  Entered by: Wyona Almas RN;  Authorized by: Hart Carwin MD;  Method used: Electronically to [facility] [address], [place], [place], [place]  [address], Ph: [phone], Fax: [phone] Rx of METOCLOPRAMIDE HCL 10 MG  TABS (METOCLOPRAMIDE HCL) As per prep instructions.;  #2 x 0;  Signed;  Entered by: Wyona Almas RN;  Authorized by: Hart Carwin MD;  Method used: Electronically to [facility] [address] [place], [place], [place]  [address], Ph: [phone], Fax: [phone] Rx of MIRALAX   POWD (POLYETHYLENE GLYCOL 3350) As per prep  instructions.;  #255gm x 0;  Signed;  Entered by: Wyona Almas RN;  Authorized by: Hart Carwin MD;  Method used: Electronically to [facility] [address] [place], [place], [place]  [address], Ph: [phone], Fax: [phone] Observations: Added new observation of ALLERGY REV: Done (02/23/2010 13:00)    Prescriptions: MIRALAX   POWD (POLYETHYLENE GLYCOL 3350) As per prep  instructions.  #255gm x 0   Entered by:   Wyona Almas RN   Authorized by:   Hart Carwin MD   Signed by:   Wyona Almas RN on 02/23/2010   Method used:   Electronically to        [facility] (retail)       [address]       [place], [place]  [address]       Ph: [phone]       Fax: [phone]   RxID:   [id number] METOCLOPRAMIDE HCL 10 MG  TABS  (METOCLOPRAMIDE HCL) As per prep instructions.  #2 x 0   Entered by:   Wyona Almas RN   Authorized by:   Hart Carwin MD   Signed by:   Wyona Almas RN on 02/23/2010   Method used:   Electronically to        [facility] (retail)       [address]       [place], [place]  [address]       Ph: [phone]       Fax: [phone]   RxID:   [id number] DULCOLAX 5 MG  TBEC (BISACODYL) Day before procedure take 2 at 3pm and 2 at 8pm.  #4 x 0   Entered by:   Wyona Almas RN   Authorized by:   Hart Carwin MD   Signed by:   Wyona Almas RN on 02/23/2010   Method used:   Electronically to        [facility] (retail)       [address]       [place], [place]  [address]       Ph: [phone]       Fax: [phone]   RxID:  [id number]  

## 2010-06-12 NOTE — Progress Notes (Signed)
Summary: RFs  Phone Note Call from Patient   Caller: Patient Summary of Call: Dr. Debby Bud received an email requesting med refills. Initial call taken by: Zackery Barefoot CMA,  April 14, 2008 6:26 PM  Follow-up for Phone Call        Medications needed refills [facility] to [facility] have been sent. Pt also requesting refills on Zoloft and Alprazolam to [facility] but pt did not advised which [facility]. Left vmail for pt to call back with that information. Follow-up by: Zackery Barefoot CMA,  April 14, 2008 6:36 PM  Additional Follow-up for Phone Call Additional follow up Details #1::        Patient returned call, she wants supply of xanax & zoloft to go to [facility]. OK? Additional Follow-up by: Lamar Sprinkles,  April 20, 2008 8:44 AM    Additional Follow-up for Phone Call Additional follow up Details #2::    OK, loosing insurance coverage Follow-up by: Jacques Navy MD,  April 20, 2008 10:07 AM  New/Updated Medications: IBUPROFEN 800 MG TABS (IBUPROFEN) Take 1 tablet by mouth once a day as needed   Prescriptions: ZOLOFT 100 MG  TABS (SERTRALINE HCL) 1 1/2 once daily  #135 x 3   Entered by:   Lamar Sprinkles   Authorized by:   Jacques Navy MD   Signed by:   Lamar Sprinkles on 04/20/2008   Method used:   Telephoned to ...       [facility] [address]* (retail)       [address]       [place], [place]  [address]       Ph: [phone]       Fax: [phone]   RxID:   [id number] ALPRAZOLAM 0.5 MG  TABS (ALPRAZOLAM) 1 by mouth three times a day prn  #270 x 1   Entered by:   Lamar Sprinkles   Authorized by:   Jacques Navy MD   Signed by:   Lamar Sprinkles on 04/20/2008   Method used:   Telephoned to ...       [facility] [address] (retail)       [address]       [place], [place]  [address]       Ph: [phone]       Fax: [phone]   RxID:   [id number] MEDROXYPROGESTERONE ACETATE 2.5 MG  TABS  (MEDROXYPROGESTERONE ACETATE) Take one by mouth as needed  #90 x 3   Entered by:   Zackery Barefoot CMA   Authorized by:   Jacques Navy MD   Signed by:   Zackery Barefoot CMA on 04/14/2008   Method used:   Electronically to        [facility] (retail)       [address]       [place], [place]  [address]       Ph: [phone]       Fax: [phone]   RxID:   [id number] IBUPROFEN 800 MG TABS (IBUPROFEN) Take 1 tablet by mouth once a day as needed  #90 x 3   Entered by:   Zackery Barefoot CMA   Authorized by:   Jacques Navy MD   Signed by:   Zackery Barefoot CMA on 04/14/2008   Method  used:   Electronically to        [facility] (retail)       [address]       [place], [place]  [address]       Ph: [phone]       Fax: [phone]   RxID:   [id number] ESTRADIOL 1 MG  TABS (ESTRADIOL) Take one tablet by mouth daily.  #90 x 3   Entered by:   Zackery Barefoot CMA   Authorized by:   Jacques Navy MD   Signed by:   Zackery Barefoot CMA on 04/14/2008   Method used:   Electronically to        [facility] (retail)       [address]       [place], [place]  [address]       Ph: [phone]       Fax: [phone]   RxID:   [id number] FUROSEMIDE 40 MG  TABS (FUROSEMIDE) 1 once daily  #90 x 3   Entered by:   Zackery Barefoot CMA   Authorized by:   Jacques Navy MD   Signed by:   Zackery Barefoot CMA on 04/14/2008   Method used:   Electronically to        [facility] (retail)       [address]       [place], [place]  [address]       Ph: [phone]       Fax: [phone]   RxID:   [id number] KLOR-CON 8 MEQ TBCR (POTASSIUM CHLORIDE) Take 1 tablet by mouth once a day  #90 x 3   Entered by:   Zackery Barefoot CMA   Authorized by:   Jacques Navy MD   Signed by:   Zackery Barefoot CMA on  04/14/2008   Method used:   Electronically to        [facility] (retail)       [address]       [place], [place]  [address]       Ph: [phone]       Fax: [phone]   RxID:   [id number]

## 2010-06-12 NOTE — Miscellaneous (Signed)
Summary: BONE DENSITY  Clinical Lists Changes  Orders: Added new Test order of T-Bone Densitometry ([address]) - Signed Added new Test order of T-Lumbar Vertebral Assessment (770[address]) - Signed 

## 2010-06-12 NOTE — Progress Notes (Signed)
Summary: rx request  Phone Note Refill Request Message from:  Pharmacy on January 04, 2009 2:26 PM  Refills Requested: Medication #1:  ZOLOFT 100 MG  TABS 1 1/2 once daily   Last Refilled: 12/05/2008   Notes: rx witten 04-20-2008 #135 Pharmacy requesting refill. Last Office Visit w/ Dr. Debby Bud: 11-30-2008. Okay for refills?  [facility] [id number]  Next Appointment Scheduled: No upcomming Initial call taken by: Beola Cord, CMA,  January 04, 2009 2:30 PM  Follow-up for Phone Call        ok to refill as needed  Follow-up by: Jacques Navy MD,  January 04, 2009 2:57 PM    Prescriptions: ZOLOFT 100 MG  TABS (SERTRALINE HCL) 1 1/2 once daily  #135 x 3   Entered by:   Orlan Leavens   Authorized by:   Jacques Navy MD   Signed by:   Orlan Leavens on 01/04/2009   Method used:   Electronically to        [facility] (retail)       [address]       [place], [place]  [address]       Ph: [phone]       Fax: [phone]   RxID:   [id number]

## 2010-06-12 NOTE — Progress Notes (Signed)
Summary: Appt sched for Bone Density 4/5, labs in [facility] for Sept 2011  ---- Converted from flag ---- ---- 07/26/2009 1:51 PM, Jacques Navy MD wrote: Please: 1) schedule for bone density study 2) lipid panel in 6 months 790.6 Thanks ------------------------------

## 2010-06-12 NOTE — Progress Notes (Signed)
Summary: Furosemide Refill  Phone Note From Pharmacy   Caller: [facility] Summary of Call: Rec'd fax refill request for Furosemide/Klor-Con. ?Ok to refill and how many times? Initial call taken by: Lucious Groves,  April 24, 2009 10:20 AM  Follow-up for Phone Call        OK to refill as needed on both furosemide and potassium Follow-up by: Jacques Navy MD,  April 24, 2009 11:26 AM  Additional Follow-up for Phone Call Additional follow up Details #1::        sent to pharmacy. Additional Follow-up by: Lucious Groves,  April 24, 2009 4:28 PM    New/Updated Medications: FUROSEMIDE 40 MG  TABS (FUROSEMIDE) 1 once daily KLOR-CON 8 MEQ TBCR (POTASSIUM CHLORIDE) Take 1 tablet by mouth once a day Prescriptions: KLOR-CON 8 MEQ TBCR (POTASSIUM CHLORIDE) Take 1 tablet by mouth once a day  #30 x 12   Entered by:   Lucious Groves   Authorized by:   Jacques Navy MD   Signed by:   Lucious Groves on 04/24/2009   Method used:   Electronically to        [facility] (retail)       [address]       [place], [place]  [address]       Ph: [phone]       Fax: [phone]   RxID:   [id number] FUROSEMIDE 40 MG  TABS (FUROSEMIDE) 1 once daily  #30 x 12   Entered by:   Lucious Groves   Authorized by:   Jacques Navy MD   Signed by:   Lucious Groves on 04/24/2009   Method used:   Electronically to        [facility] (retail)       [address]       [place], [place]  [address]       Ph: [phone]       Fax: [phone]   RxID:   [id number]

## 2010-06-12 NOTE — Letter (Signed)
Summary: [facility]  [facility]   Imported By: Lester Lititz 10/24/2009 11:49:53  _____________________________________________________________________  External Attachment:    Type:   Image     Comment:   External Document

## 2010-06-12 NOTE — Assessment & Plan Note (Signed)
Summary: CONGESTION-STC  Medications Added MUCINEX 600 MG  TB12 (GUAIFENESIN) Take 1 tablet by mouth once a day        Vital Signs:  Patient Profile:   65 Years Old Female Weight:      185 pounds Temp:     98.2 degrees F oral Pulse rate:   65 / minute BP sitting:   155 / 85  (left arm) Cuff size:   large  Vitals Entered By: Zackery Barefoot CMA (April 22, 2007 4:25 PM)                 PCP:  Halana Deisher  Chief Complaint:  Flu shot/depression/Paperwork.  History of Present Illness: Patient presents for multiple issues. Being asked to redo FMLA.  Current Allergies: ! BACTRIM        Impression & Recommendations:  Problem # 1:  ENCOUNTERS UNSPECIFIED ADMINISTRATIVE PURPOSE (ICD-V68.9) Completed FMLA forms.  Complete Medication List: 1)  Symbicort 160-4.5 Mcg/act Aero (Budesonide-formoterol fumarate) .... 2 puffs two times a day 2)  Pulmicort 0.25 Mg/105ml Susp (Budesonide (inhalation)) .... Nebulizer as needed 3)  Xopenex 1.25 Mg/30ml Nebu (Levalbuterol hcl) .... Nebulizer as needed 4)  Diovan 320 Mg Tabs (Valsartan) .Marland Kitchen.. 1 once daily 5)  Astelin 137 Mcg/spray Soln (Azelastine hcl) .... Once daily 6)  Zyrtec Allergy 10 Mg Tabs (Cetirizine hcl) .Marland Kitchen.. 1 1/2 once daily 7)  Accolate 20 Mg Tabs (Zafirlukast) .Marland Kitchen.. 1 once daily 8)  Nexium 40 Mg Cpdr (Esomeprazole magnesium) .Marland Kitchen.. 1 two times a day 9)  Furosemide 40 Mg Tabs (Furosemide) .Marland Kitchen.. 1 once daily 10)  Zoloft 100 Mg Tabs (Sertraline hcl) .Marland Kitchen.. 1 1/2 once daily 11)  Aerohist 8-2.5 Mg Tb12 (Chlorpheniramine-methscop) .Marland Kitchen.. 1 two times a day 12)  Nasarel 29 Mcg/act Soln (Flunisolide) .... Once daily 13)  Motrin Ib 200 Mg Tabs (Ibuprofen) .... 800 mg as needed 14)  Xanax 0.25 Mg Tabs (Alprazolam) .Marland Kitchen.. 1 as needed 15)  Librax 2.5-5 Mg Caps (Clidinium-chlordiazepoxide) .Marland Kitchen.. 1 three times a day 16)  Imitrex  .... As needed 17)  Femhrt 1/5 1-5 Mg-mcg Tabs (Norethindrone-eth estradiol) .Marland Kitchen.. 1 once daily 18)  Proventil Hfa 108  (90 Base) Mcg/act Aers (Albuterol sulfate) .... As needed 19)  Multivitamin  .... Once daily 20)  Iron  .... Daily 21)  Vitamin C  .... Daily 22)  B12  .... Daily 23)  Potassium  .... Daily 24)  Mucinex 600 Mg Tb12 (Guaifenesin) .... Take 1 tablet by mouth once a day     ]  Appended Document: Flu shot     Clinical Lists Changes  Orders: Added new Service order of Influenza Vaccine NON MCR (13086) - Signed Observations: Added new observation of FLU VAX#1VIS: 12/04/06 version given April 22, 2007. (04/22/2007 17:17) Added new observation of FLU VAXLOT: [id number] (04/22/2007 17:17) Added new observation of FLU VAX EXP: 11/10/2007 (04/22/2007 17:17) Added new observation of FLU VAXBY: Zackery Barefoot CMA (04/22/2007 17:17) Added new observation of FLU VAXRTE: IM (04/22/2007 17:17) Added new observation of FLU VAX DSE: 0.5 ml (04/22/2007 17:17) Added new observation of FLU VAXMFR: Sanofi Pasteur (04/22/2007 17:17) Added new observation of FLU VAX SITE: left deltoid (04/22/2007 17:17) Added new observation of FLU VAX: Fluvax Non-MCR (04/22/2007 17:17)        Influenza Vaccine    Vaccine Type: Fluvax Non-MCR    Site: left deltoid    Mfr: Sanofi Pasteur    Dose: 0.5 ml    Route: IM    Given by: Shanda Bumps  Roxan Hockey CMA    Exp. Date: 11/10/2007    Lot #: [id number]    VIS given: 12/04/06 version given April 22, 2007.  Flu Vaccine Consent Questions    Do you have a history of severe allergic reactions to this vaccine? no    Any prior history of allergic reactions to egg and/or gelatin? no    Do you have a sensitivity to the preservative Thimersol? no    Do you have a past history of Guillan-Barre Syndrome? no    Do you currently have an acute febrile illness? no    Have you ever had a severe reaction to latex? no    Vaccine information given and explained to patient? yes    Are you currently pregnant? no

## 2010-06-12 NOTE — Assessment & Plan Note (Signed)
Summary: BP CHECK/MEN/CD  Nurse Visit   Vital Signs:  Patient profile:   65 year old female BP sitting:   150 / 82  (left arm)  Vitals Entered By: Lamar Sprinkles, CMA (January 10, 2009 2:43 PM)  Allergies: 1)  ! Bactrim 2)  ! Enalapril Maleate  Orders Added: 1)  Est. Patient Level I [[address]] Prescriptions: COZAAR 100 MG TABS (LOSARTAN POTASSIUM) 1 once daily  #30 x 0   Entered by:   Lamar Sprinkles, CMA   Authorized by:   Jacques Navy MD   Signed by:   Lamar Sprinkles, CMA on 01/10/2009   Method used:   Electronically to        [facility] (retail)       [address]       [place], [place]  [address]       Ph: [phone]       Fax: [phone]   RxID:   [id number]   Dr Debby Bud aware of bp. Per Dr, increase cozaar from 50mg  to 100mg , & follow up in 2 wks. Patient informed, rx sent in.

## 2010-06-12 NOTE — Progress Notes (Signed)
Summary: BP MED PROBLEM?   Phone Note Call from Patient Call back at [facility] Phone [phone]   Summary of Call: Patient is requesting a call back regarding BP medication. She c/o h/a, ear pain, belching & feels "super cold". She feels that this is due to bp med. Does pt need office visit for re-eval? BP at home was 103/66 Initial call taken by: Lamar Sprinkles, CMA,  January 30, 2010 9:57 AM  Follow-up for Phone Call        not necessarily medication. Plan - drug holiday from exforge for 5 days to see if symptoms resolve. iF yes - ov to make med change, if no - resume exforge.  Follow-up by: Jacques Navy MD,  January 30, 2010 12:48 PM  Additional Follow-up for Phone Call Additional follow up Details #1::        Pt informed, she will call office back w/update  Additional Follow-up by: Lamar Sprinkles, CMA,  January 30, 2010 5:43 PM    New/Updated Medications: EXFORGE 5-160 MG TABS (AMLODIPINE BESYLATE-VALSARTAN) 1 by mouth once daily -HOLD X 5 days(01/30/2010)

## 2010-06-12 NOTE — Miscellaneous (Signed)
Summary: PAP  Clinical Lists Changes  Observations: Added new observation of PAP SMEAR: Done (07/23/2007 11:12)       Preventive Care Screening  Pap Smear:    Date:  07/23/2007    Results:  Done

## 2010-06-12 NOTE — Letter (Signed)
    [facility] [address] [place], [place]  [address] Phone: [phone]      July 29, 2007   Halli Rothbauer [address] [place], [place] [address]  RE:  LAB RESULTS  Dear  Ms. Juste,  The following is an interpretation of your most recent lab tests.  Please take note of any instructions provided or changes to medications that have resulted from your lab work.  Pap Smear: normal      Call or e-mail me if you have questions ([email or web address])    Sincerely Yours,    Jacques Navy MD

## 2010-06-12 NOTE — Assessment & Plan Note (Signed)
Summary: chills, cough SD   Vital Signs:  Patient Profile:   65 Years Old Female Weight:      194 pounds Temp:     98.5 degrees F oral Pulse rate:   65 / minute BP sitting:   167 / 86  (left arm) Cuff size:   large  Vitals Entered By: Zackery Barefoot CMA (October 27, 2007 9:14 AM)                 PCP:  Debby Bud  Chief Complaint:  Shakes/productive cough/congestion x 2 weeks/taking Avelox qd from Dr. [place] Callas.  History of Present Illness: patient had a URI in February and she had two courses, 7 days and then 14 days of avelox, prescribed by Dr.  Callas. She was also treated for one month with Spiriva. She did respond. This June 7th, sunday, she had recurrent symptoms congestion, low grade fever, cough productive of purulent sputum, shakes, dizziness. Because of her symptoms she had filled another course of Avelox for 14 days, starting June 7th. She did inform Dr. Lyla Son office of her recurrent episode and that she had started Avelox.   Today she is still short of breath, still coughing with gray sputum, light-headed. She reports using albuterol  once a day, symbicort two times a day, asta-pro (an astelin type drug), flucinolone nasal spray. Reports low grade fever at home.    Updated Prior Medication List: SYMBICORT 160-4.5 MCG/ACT  AERO (BUDESONIDE-FORMOTEROL FUMARATE) 2 puffs two times a day PULMICORT 0.25 MG/2ML  SUSP (BUDESONIDE (INHALATION)) nebulizer as needed XOPENEX 1.25 MG/3ML  NEBU (LEVALBUTEROL HCL) nebulizer as needed DIOVAN 320 MG  TABS (VALSARTAN) 1 once daily ASTEPRO 137 MCG/SPRAY  SOLN (AZELASTINE HCL) AS DIRECTED ZYRTEC ALLERGY 10 MG  TABS (CETIRIZINE HCL) 1  once daily ACCOLATE 20 MG  TABS (ZAFIRLUKAST) 1 once daily NEXIUM 40 MG  CPDR (ESOMEPRAZOLE MAGNESIUM) 1 two times a day FUROSEMIDE 40 MG  TABS (FUROSEMIDE) 1 once daily ZOLOFT 100 MG  TABS (SERTRALINE HCL) 1 1/2 once daily FLONASE 50 MCG/ACT  SUSP (FLUTICASONE PROPIONATE) as directed MOTRIN IB 200 MG  TABS  (IBUPROFEN) 800 mg as needed XANAX 0.25 MG  TABS (ALPRAZOLAM) 1 as needed LIBRAX 2.5-5 MG  CAPS (CLIDINIUM-CHLORDIAZEPOXIDE) 1 three times a day * IMITREX as needed PROVENTIL HFA 108 (90 BASE) MCG/ACT  AERS (ALBUTEROL SULFATE) as needed * MULTIVITAMIN once daily * IRON daily * VITAMIN C daily * B12 daily MUCINEX 600 MG  TB12 (GUAIFENESIN) Take 1 tablet by mouth once a day KLOR-CON 8 MEQ TBCR (POTASSIUM CHLORIDE) Take 1 tablet by mouth once a day  Current Allergies: ! BACTRIM  Past Medical History:    Reviewed history from 12/01/2006 and no changes required:       Allergic rhinitis       Headache       Hypertension       Asthma       Recurrent bronchitis  Past Surgical History:    Reviewed history from 12/01/2006 and no changes required:       Lumpectomy       Tubal ligation       Tonsillectomy       septoplasty       transnasal laryngoscopy   Family History:    Reviewed history from 07/23/2007 and no changes required:       father - deceased @ 39: CAD/MI fatal, HTN       mother- 66; good health       Neg-  beast or colon cancer, DM  Social History:    Reviewed history from 07/23/2007 and no changes required:       [name] -BS, has had additional course work       married '71- after 29 years husband left, divorce final in '06       1 son- '74, 1 daughter '76; 1 grandchild       work: Technical sales engineer of [place]       Lives alone     Review of Systems       The patient complains of fever, hoarseness, chest pain, dyspnea on exertion, prolonged cough, headaches, and severe indigestion/heartburn.  The patient denies anorexia, weight loss, weight gain, peripheral edema, abdominal pain, muscle weakness, difficulty walking, abnormal bleeding, and angioedema.     Physical Exam  General:     alert, well-developed, well-nourished, and well-hydrated.   Head:     normocephalic and atraumatic.   Eyes:     pupils equal, pupils round, pupils reactive to light, corneas and lenses  clear, and no injection.   Neck:     No deformities, masses, or tenderness noted. Chest Wall:     no deformities and no tenderness.   Lungs:     forced respirations against her glotis giving upper airway coarse breath sounds. No accessory muscles fo respiration in use, no wheezing, no rhonchi or sounds of consolidation. O2 sat 99% on room air. Heart:     normal rate and regular rhythm.   Extremities:     No clubbing, cyanosis, edema, or deformity noted with normal full range of motion of all joints.   Neurologic:     alert & oriented X3, cranial nerves II-XII intact, strength normal in all extremities, and sensation intact to light touch.      Impression & Recommendations:  Problem # 1:  ACUTE BRONCHITIS (ICD-466.0) Patient with what appears to be partially treated acute bronchitis. O2 sat is great. Exam shows only upper airway sounds with clear lungs. Chest x-ray is negative for infiltrates or effusions, question of bronchitic changes.  Plan: finish avelox, continue present meds, short course of prednisone 10mg  once daily x 5 for upper airway edema. Call for increasing problems.  The following medications were removed from the medication list:    Spiriva Handihaler 18 Mcg Caps (Tiotropium bromide monohydrate) ..... Every morning  Her updated medication list for this problem includes:    Symbicort 160-4.5 Mcg/act Aero (Budesonide-formoterol fumarate) .Marland Kitchen... 2 puffs two times a day    Pulmicort 0.25 Mg/69ml Susp (Budesonide (inhalation)) ..... Nebulizer as needed    Xopenex 1.25 Mg/18ml Nebu (Levalbuterol hcl) ..... Nebulizer as needed    Accolate 20 Mg Tabs (Zafirlukast) .Marland Kitchen... 1 once daily    Proventil Hfa 108 (90 Base) Mcg/act Aers (Albuterol sulfate) .Marland Kitchen... As needed    Mucinex 600 Mg Tb12 (Guaifenesin) .Marland Kitchen... Take 1 tablet by mouth once a day  Orders: T-2 View CXR, Same Day (71020.5TC)   Complete Medication List: 1)  Symbicort 160-4.5 Mcg/act Aero (Budesonide-formoterol fumarate)  .... 2 puffs two times a day 2)  Pulmicort 0.25 Mg/40ml Susp (Budesonide (inhalation)) .... Nebulizer as needed 3)  Xopenex 1.25 Mg/1ml Nebu (Levalbuterol hcl) .... Nebulizer as needed 4)  Diovan 320 Mg Tabs (Valsartan) .Marland Kitchen.. 1 once daily 5)  Astepro 137 Mcg/spray Soln (Azelastine hcl) .... As directed 6)  Zyrtec Allergy 10 Mg Tabs (Cetirizine hcl) .Marland Kitchen.. 1  once daily 7)  Accolate 20 Mg Tabs (Zafirlukast) .Marland Kitchen.. 1 once daily 8)  Nexium 40 Mg Cpdr (Esomeprazole magnesium) .Marland Kitchen.. 1 two times a day 9)  Furosemide 40 Mg Tabs (Furosemide) .Marland Kitchen.. 1 once daily 10)  Zoloft 100 Mg Tabs (Sertraline hcl) .Marland Kitchen.. 1 1/2 once daily 11)  Flonase 50 Mcg/act Susp (Fluticasone propionate) .... As directed 12)  Motrin Ib 200 Mg Tabs (Ibuprofen) .... 800 mg as needed 13)  Xanax 0.25 Mg Tabs (Alprazolam) .Marland Kitchen.. 1 as needed 14)  Librax 2.5-5 Mg Caps (Clidinium-chlordiazepoxide) .Marland Kitchen.. 1 three times a day 15)  Imitrex  .... As needed 16)  Proventil Hfa 108 (90 Base) Mcg/act Aers (Albuterol sulfate) .... As needed 17)  Multivitamin  .... Once daily 18)  Iron  .... Daily 19)  Vitamin C  .... Daily 20)  B12  .... Daily 21)  Mucinex 600 Mg Tb12 (Guaifenesin) .... Take 1 tablet by mouth once a day 22)  Klor-con 8 Meq Tbcr (Potassium chloride) .... Take 1 tablet by mouth once a day 23)  Prednisone 10 Mg Tabs (Prednisone) .Marland Kitchen.. 1 by mouth once daily    Prescriptions: PREDNISONE 10 MG  TABS (PREDNISONE) 1 by mouth once daily  #5 x 0   Entered and Authorized by:   Jacques Navy MD   Signed by:   Jacques Navy MD on 10/27/2007   Method used:   Electronically sent to ...       [facility]       [address]       [place], [place]  [address]       Ph: [phone]       Fax: [phone]   RxID:   [id number]  ]

## 2010-06-12 NOTE — Assessment & Plan Note (Signed)
Summary: PER PT PHYSICAL---$50---STC   Vital Signs:  Patient profile:   65 year old female Height:      67 inches Weight:      202 pounds BMI:     31.75 Temp:     98.8 degrees F oral Pulse rate:   66 / minute BP sitting:   160 / 110  (left arm) Cuff size:   large  Vitals Entered By: Zackery Barefoot CMA (July 25, 2008 1:59 PM)   Primary Care Provider:  Salina Stanfield   History of Present Illness: Interval history: No real changes in medical conditions. Did have a URI several weeks ago and did go to urgent care and was treated with Augmentin, finishing antibiotics about 10 days ago.   Presents today for routine care.   Asthma History    Initial Asthma Severity Rating:    Age range: 12+ years    Symptoms: 0-2 days/week    Nighttime Awakenings: 0-2/month    Interferes w/ normal activity: some limitations    SABA use (not for EIB): >2 days/week but not >1X/day    Exacerbations requiring oral systemic steroids: 0-1/year    Asthma Severity Assessment: Moderate Persistent  Hypertension History:      She denies headache, chest pain, palpitations, dyspnea with exertion, orthopnea, PND, peripheral edema, neurologic problems, and syncope.        Positive major cardiovascular risk factors include female age 34 years old or older and hypertension.  Negative major cardiovascular risk factors include non-tobacco-user status.      Current Medications (verified): 1)  Symbicort 160-4.5 Mcg/act  Aero (Budesonide-Formoterol Fumarate) .... 2 Puffs Two Times A Day 2)  Pulmicort 0.25 Mg/38ml  Susp (Budesonide (Inhalation)) .... Nebulizer As Needed 3)  Xopenex 1.25 Mg/61ml  Nebu (Levalbuterol Hcl) .... Nebulizer As Needed 4)  Diovan 320 Mg  Tabs (Valsartan) .Marland Kitchen.. 1 Once Daily 5)  Astepro 137 Mcg/spray  Soln (Azelastine Hcl) .... As Directed 6)  Zyrtec Allergy 10 Mg  Tabs (Cetirizine Hcl) .Marland Kitchen.. 1  Once Daily 7)  Accolate 20 Mg  Tabs (Zafirlukast) .Marland Kitchen.. 1 Once Daily 8)  Nexium 40 Mg  Cpdr (Esomeprazole  Magnesium) .Marland Kitchen.. 1 Two Times A Day 9)  Furosemide 40 Mg  Tabs (Furosemide) .Marland Kitchen.. 1 Once Daily 10)  Zoloft 100 Mg  Tabs (Sertraline Hcl) .Marland Kitchen.. 1 1/2 Once Daily 11)  Flonase 50 Mcg/act  Susp (Fluticasone Propionate) .... As Directed 12)  Ibuprofen 800 Mg Tabs (Ibuprofen) .... Take 1 Tablet By Mouth Once A Day As Needed 13)  Alprazolam 0.5 Mg  Tabs (Alprazolam) .Marland Kitchen.. 1 By Mouth Three Times A Day Prn 14)  Librax 2.5-5 Mg  Caps (Clidinium-Chlordiazepoxide) .Marland Kitchen.. 1 Three Times A Day 15)  Imitrex .... As Needed 16)  Proventil Hfa 108 (90 Base) Mcg/act  Aers (Albuterol Sulfate) .... As Needed 17)  Multivitamin .... Once Daily 18)  Iron .... Daily 19)  Vitamin C .... Daily 20)  B12 .... Daily 21)  Mucinex 600 Mg  Tb12 (Guaifenesin) .... Take 1 Tablet By Mouth Once A Day 22)  Klor-Con 8 Meq Tbcr (Potassium Chloride) .... Take 1 Tablet By Mouth Once A Day 23)  Medroxyprogesterone Acetate 2.5 Mg  Tabs (Medroxyprogesterone Acetate) .... Take One By Mouth As Needed 24)  Estradiol 1 Mg  Tabs (Estradiol) .... Take One Tablet By Mouth Daily.  Allergies (verified): 1)  ! 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    Lives alone   Review of Systems       The patient complains of fever and dyspnea on exertion.  The patient denies anorexia, weight loss, weight gain, vision loss, decreased hearing, hoarseness, chest pain, peripheral edema, prolonged cough, hemoptysis, abdominal pain, severe indigestion/heartburn, incontinence, muscle weakness, difficulty walking, abnormal bleeding, and breast masses.    Physical Exam  General:  overwight AA female NAD Head:  Normocephalic and atraumatic without obvious abnormalities. No apparent alopecia or balding. Eyes:  No corneal or conjunctival inflammation noted. EOMI. Perrla. Funduscopic exam benign, without hemorrhages, exudates or papilledema. Vision grossly normal. Ears:  External ear exam shows no significant lesions or deformities.  Otoscopic examination reveals clear canals, tympanic membranes are intact bilaterally without bulging, retraction, inflammation or discharge. Hearing is grossly normal bilaterally. Nose:  no external deformity and no external erythema.   Mouth:  Oral mucosa and oropharynx without lesions or exudates.  Teeth in good repair. Neck:  No deformities, masses, or tenderness noted. Chest Wall:  No deformities, masses, or tenderness noted. Breasts:  No mass, nodules, thickening, tenderness, bulging, retraction, inflamation, nipple discharge or skin changes noted.   Lungs:  Normal respiratory effort, chest expands symmetrically. Lungs are clear to auscultation, no crackles or wheezes. Heart:   Normal rate and regular rhythm. S1 and S2 normal without gallop, murmur, click, rub or other extra sounds. Abdomen:  soft, non-tender, normal bowel sounds, no guarding, and no rigidity.   Genitalia:  Pelvic Exam:        External: normal female genitalia without lesions or masses        Vagina: normal without lesions or masses        Cervix: normal without lesions or masses        Adnexa: normal bimanual exam without masses or fullness        Uterus: normal by palpation        Pap smear: performed Msk:  No deformity or scoliosis noted of thoracic or lumbar spine.   Pulses:  R and L carotid,radial,femoral,dorsalis pedis and posterior tibial pulses are full and equal bilaterally Extremities:  No clubbing, cyanosis, edema, or deformity noted with normal full range of motion of all joints.   Neurologic:  alert & oriented X3, cranial nerves II-XII intact, strength normal in all extremities, sensation intact to light touch, sensation intact to pinprick, gait normal, and DTRs symmetrical and normal.   Skin:  Intact without suspicious lesions or rashes Cervical Nodes:  no anterior cervical adenopathy and no posterior cervical adenopathy.   Psych:  Oriented X3, memory intact for recent and remote, normally interactive, and good eye contact.     Impression & Recommendations:  Problem # 1:  G E REFLUX (ICD-530.81) Stable on PPI therapy and occasional librax.  Plan- continue present medications.  Her updated medication list for this problem includes:    Nexium 40 Mg Cpdr (Esomeprazole magnesium) .Marland Kitchen... 1 two times a day    Librax 2.5-5 Mg Caps (Clidinium-chlordiazepoxide) .Marland Kitchen... 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 She does not appear to be suffering a relapse nor need any additional treatment.  Problem # 3:  ASTHMA (ICD-493.90) Presently stable  having made a good recovery from a bout of bronchitis. She is headed into allergy season which usually will lead to an exacerbation of her symptoms.   Plan - she will continue her present medications.  Her updated medication list for this problem includes:    Symbicort 160-4.5 Mcg/act Aero (Budesonide-formoterol fumarate) .Marland Kitchen... 2 puffs two times a day    Pulmicort 0.25 Mg/29ml Susp (Budesonide (inhalation)) ..... Nebulizer as needed    Xopenex 1.25 Mg/75ml Nebu (Levalbuterol hcl) ..... Nebulizer as needed    Accolate 20 Mg Tabs (Zafirlukast) .Marland Kitchen... 1 once daily    Proventil Hfa 108 (90 Base) Mcg/act Aers (Albuterol sulfate) .Marland Kitchen... As needed  Problem # 4:  HYPERTENSION (ICD-401.9)  Her updated medication list for this problem includes:    Diovan 320 Mg Tabs (Valsartan) .Marland Kitchen... 1 once daily    Furosemide 40 Mg Tabs (Furosemide) .Marland Kitchen... 1 once daily  BP today: 160/110 Prior BP: 146/96 (12/10/2007)  10 Yr Risk Heart Disease: 17 %  Labs Reviewed: Creat: 1.1 (07/18/2008) Chol: 191 (07/18/2008)   HDL: 49.0 (07/18/2008)   LDL: 130 (07/18/2008)   TG: 59 (07/18/2008)  Her blood pressure is poorly controlled at today's visit. She is asymptomatic  Plan: she will monitor BP at home and if she is running SBP great than 140 or DBP greater than 90 on a regular basis she will notify me and medications will be adjusted.   Problem # 5:  ALLERGIC RHINITIS (ICD-477.9) She reports that she has stopped allergy shots. She is eating local honey on a regular basis which does seem to help her symptoms. However, with allergy season approaching she does plan to see her allergist for follow-up.  Her updated medication list for this problem includes:    Astepro 137 Mcg/spray Soln (Azelastine hcl) .Marland Kitchen... As directed    Zyrtec Allergy 10 Mg Tabs (Cetirizine hcl) .Marland Kitchen... 1  once daily    Flonase 50 Mcg/act Susp (Fluticasone propionate) .Marland Kitchen... As directed  Problem # 6:  DEGENERATIVE JOINT DISEASE, KNEES, BILATERAL  (ICD-715.96) Diffuse DJD has been stable and not limiting her activities. No medication side effects  from the ibuprofen. Normal renal function.  Her updated medication list for this problem includes:    Ibuprofen 800 Mg Tabs (Ibuprofen) .Marland Kitchen... Take 1 tablet by mouth once a day as needed  Problem # 7:  Preventive Health Care (ICD-V70.0) General physical exam OK except for weight: plan is life-style managment with smart food choices within her food preferences, portion size restriction and an effort to get regular aerobic exercise - if only walking 3-4 days a week. Goal is to loose 1 lb/month consistently for a target of 50lbs of weight loss over 5 years.   Up-to-date with colorectal cancer screening. Normal well woman exam today, Up-to-date with mammography.  In summary: multiple medical problems with suboptimal control of blood pressure and weight both problems to be addressed with life-style and monitoring. she will need to report back in regard to her blood pressure levels at home so that medications may be adjusted if needed.   Complete Medication List: 1)  Symbicort 160-4.5 Mcg/act Aero (Budesonide-formoterol fumarate) .... 2 puffs two times a day 2)  Pulmicort 0.25 Mg/13ml Susp (Budesonide (inhalation)) .... Nebulizer as needed 3)  Xopenex 1.25 Mg/49ml Nebu (Levalbuterol hcl) .... Nebulizer as needed 4)  Diovan 320 Mg Tabs (Valsartan) .Marland Kitchen.. 1 once daily 5)  Astepro 137 Mcg/spray Soln (Azelastine hcl) .... As directed 6)  Zyrtec Allergy 10 Mg Tabs (Cetirizine hcl) .Marland Kitchen.. 1  once daily 7)  Accolate 20 Mg Tabs (Zafirlukast) .Marland Kitchen.. 1 once daily 8)  Nexium 40 Mg Cpdr (Esomeprazole magnesium) .Marland Kitchen.. 1 two times a day 9)  Furosemide 40 Mg Tabs (Furosemide) .Marland Kitchen.. 1 once daily 10)  Zoloft 100 Mg Tabs (Sertraline hcl) .Marland Kitchen.. 1 1/2 once daily 11)  Flonase 50 Mcg/act Susp (Fluticasone propionate) .... As directed 12)  Ibuprofen 800 Mg Tabs (Ibuprofen) .... Take 1 tablet by mouth once a day as needed 13)   Alprazolam 0.5 Mg Tabs (Alprazolam) .Marland Kitchen.. 1 by mouth three times a day prn 14)  Librax 2.5-5 Mg Caps (Clidinium-chlordiazepoxide) .Marland Kitchen.. 1 three times a day 15)  Imitrex  .... As needed 16)  Proventil Hfa 108 (90 Base) Mcg/act Aers (Albuterol sulfate) .... As needed 17)  Multivitamin  .... Once daily 18)  Iron  .... Daily 19)  Vitamin C  .... Daily 20)  B12  .... Daily 21)  Mucinex 600 Mg Tb12 (Guaifenesin) .... Take 1 tablet by mouth once a day 22)  Klor-con 8 Meq Tbcr (Potassium chloride) .... Take 1 tablet by mouth once a day 23)  Medroxyprogesterone Acetate 2.5 Mg Tabs (Medroxyprogesterone acetate) .... Take one by mouth as needed 24)  Estradiol 1 Mg Tabs (Estradiol) .... Take one tablet by mouth daily.  Hypertension Assessment/Plan:      The patient's hypertensive risk group is category B: At least one risk factor (excluding diabetes) with no target organ damage.  Her calculated 10 year risk of coronary heart disease is 17 %.  Today's blood pressure is 160/110.    Patient: Dominique Johnston Note: All result statuses are Final unless otherwise noted.  Tests: (1) LIPID PROFILE (LIPID)   CHOLESTEROL               191 mg/dL                   3-235       ATP III Classification:             < 200       mg/dL      Desireable  200 - 239    mg/dL      Borderline High            > = 240      mg/dL      High   TRIGLYCERIDES             59 mg/dL                    0-454        Normal:  < 150 mg/dL        Borderline High:  150 - 199 mg/dL   HDL                       09.8 mg/dL                  >11.9   VLDL CHOLESTEROL          12 mg/dl                    1-47   LDL CHOLESTEROL      [H]  829 mg/dl                   5-62  CHOL/HDL Ratio: CHD Risk                             3.9 CALC  Tests: (2) URINE DIPTICK (UDIP)   COLOR                     LT YELLOW                   YELLOW   CLARITY                   Clear                       CLEAR   SP.GRAVITY                1.020                        1.000-1.035   URINE pH                  6.0                         5.0 - 8.0   PROTEIN                   Negative                    NEGATIVE   URINE GLUCOSE             NEGATIVE                    NEGATIVE   KETONES                   NEGATIVE                    NEGATIVE   URINE BILIRUBIN           NEGATIVE                    NEGATIVE   BLOOD  NEGATIVE                    NEGATIVE   UROBILINOGEN              0.2 mg/dL                   1.6-1.0   LEUKOCYTE ESTERACE   [A]  Trace                       NEGATIVE     TRANS EPITH RARE ON MICRO   NITRITE                   Negative                    NEGATIVE  Tests: (3) CBC WITH DIFF (CBCD)   WHITE CELL COUNT     [L]  4.3 K/uL                    4.5-10.5   RED CELL COUNT       [H]  5.31 Mil/uL                 3.87-5.11   HEMOGLOBIN                12.7 g/dL                   96.0-45.4   HEMATOCRIT                38.8 %                      36.0-46.0   MCV                  [L]  73.1 fl                     78.0-100.0   MCHC                      32.7 g/dL                   09.8-11.9   RDW                       14.0 %                      11.5-14.6   PLATELET COUNT            203 K/uL                    150-400   NEUTROPHIL %         [L]  40.9 %                      43.0-77.0   LYMPHOCYTE %              43.5 %                      12.0-46.0   MONOCYTE %                11.5 %                      3.0-12.0   EOSINOPHILS %  3.2 %                       0.0-5.0   BASOPHILS %               0.9 %                       0.0-3.0  NEUTROPHILS, ABSOLUTE                             1.8 K/uL                    1.4-7.7   MONOCYTES, ABSOLUTE       0.5 K/uL                    0.1-1.0  EOSINOPHILS, ABSOLUTE                             0.1 K/uL                    0.0-0.7   BASOPHILS, ABSOLUTE       0.0 K/uL                    0.0-0.1  Tests: (4) URINALYSIS (URINE)   URINE WBC                 0-2                          <3/HPF   URINE RBC                 0-2                         <3/HPF   URINE MUCUS               NEGATIVE   URINE EPITH               RARE   RENAL EPITHELIAL          RARE (C)   URINE BACTERIA            NEGATIVE   HYALINE CASTS             NONE   GRANULAR CASTS            NONE   URINE CRYSTALS            NEGATIVE   URINE TRICHOMONAS         NONE   URINE YEAST               NONE  Tests: (5) BASIC METABOLIC PANEL (METABOL)   SODIUM                    143 mEq/L                   135-145   POTASSIUM                 4.0 mEq/L                   3.5-5.1   CHLORIDE                  107 mEq/L  96-112   CARBON DIOXIDE            29 mEq/L                    19-32   GLUCOSE                   84 mg/dL                    16-10   BUN                       10 mg/dL                    9-60   CREATININE                1.1 mg/dL                   4.5-4.0   CALCIUM                   9.0 mg/dL                   9.8-11.9  GFR (AFRICAN AMERICAN)                             65 mL/min  GFR (NON-AFRICAN AMERICAN)                             53 mL/min  Tests: (6) HEPATIC FUNCTION PANEL (HEPATIC)   TOTAL BILIRUBIN           0.8 mg/dL                   1.4-7.8   DIRECT BILIRUBIN          0.1 mg/dL                   2.9-5.6   ALKALINE PHOSPHATASE      68 U/L                      39-117   SGOT (AST)                30 U/L                      0-37   SGPT (ALT)                23 U/L                      0-35   TOTAL PROTEIN             7.0 g/dL                    2.1-3.0   ALBUMIN                   3.8 g/dL                    8.6-5.7  Tests: (7) FastTSH (TSH)   FastTSH                   1.54 uIU/mL                 0.35-5.50

## 2010-06-12 NOTE — Medication Information (Signed)
Summary: Prilosec Approved/[facility]  Prilosec Approved/[facility]   Imported By: Sherian Rein 10/02/2009 08:51:26  _____________________________________________________________________  External Attachment:    Type:   Image     Comment:   External Document

## 2010-06-12 NOTE — Assessment & Plan Note (Signed)
Summary: flu shot/men/cd  Nurse Visit   Allergies: 1)  ! Bactrim 2)  ! Enalapril Maleate  Orders Added: 1)  Admin 1st Vaccine [90471] 2)  Flu Vaccine 29yrs + [[address]] .lbflu   Flu Vaccine Consent Questions     Do you have a history of severe allergic reactions to this vaccine? no    Any prior history of allergic reactions to egg and/or gelatin? no    Do you have a sensitivity to the preservative Thimersol? no    Do you have a past history of Guillan-Barre Syndrome? no    Do you currently have an acute febrile illness? no    Have you ever had a severe reaction to latex? no    Vaccine information given and explained to patient? yes    Are you currently pregnant? no    Lot Number:[id number]   Exp Date:11/10/2010   Site Given  Right Deltoid IM Lanier Prude, [facility])  February 28, 2010 2:38 PM

## 2010-06-12 NOTE — Progress Notes (Signed)
Summary: Zostavax reaction  Phone Note Call from Patient Call back at [facility] Phone [phone]   Summary of Call: Pt had shingles vacc at office visit. C/o raised, itchy, red area, approx 50 cent piece size. She is using hydrocotisone cream and taking benadryl at bedtime. Any further suggestions?  Initial call taken by: Lamar Sprinkles, CMA,  July 28, 2009 11:48 AM  Follow-up for Phone Call        no further suggestions Follow-up by: Jacques Navy MD,  July 28, 2009 1:11 PM  Additional Follow-up for Phone Call Additional follow up Details #1::        Pt informed and advised to call office with any new concerns.  Additional Follow-up by: Lamar Sprinkles, CMA,  July 28, 2009 1:44 PM

## 2010-06-12 NOTE — Consult Note (Signed)
Summary: [facility]  [facility]   Imported By: Maryln Gottron 01/12/2008 15:53:09  _____________________________________________________________________  External Attachment:    Type:   Image     Comment:   External Document

## 2010-06-12 NOTE — Progress Notes (Signed)
Summary: BP MED SIDE EFFECT  Phone Note Call from Patient   Summary of Call: At last office visit pt req generic for Diovan. Pt has been on med x 3 or 4 day. Pt states she has had "every side effect". She says she almost fainted yesterday. Also c/o back ache, scratchy throat  & stomach upset. She will not take anymore of med. Alternative that is covered and generic is cozaar.  Initial call taken by: Lamar Sprinkles,  November 28, 2008 10:55 AM  Follow-up for Phone Call        OK for generic cozaar 50mg  once daily, #30, generic, as needed refils Follow-up by: Jacques Navy MD,  November 28, 2008 1:14 PM  Additional Follow-up for Phone Call Additional follow up Details #1::        left mess to call office back .....................Marland KitchenLamar Sprinkles  November 28, 2008 3:44 PM   Pt informed  Additional Follow-up by: Lamar Sprinkles,  November 29, 2008 8:27 AM    New/Updated Medications: COZAAR 50 MG TABS (LOSARTAN POTASSIUM) 1 p once daily Prescriptions: COZAAR 50 MG TABS (LOSARTAN POTASSIUM) 1 p once daily  #30 x 12   Entered by:   Lamar Sprinkles   Authorized by:   Jacques Navy MD   Signed by:   Lamar Sprinkles on 11/29/2008   Method used:   Electronically to        [facility] (retail)       [address]       [place], [place]  [address]       Ph: [phone]       Fax: [phone]   RxID:   [id number]

## 2010-06-12 NOTE — Letter (Signed)
Summary: [facility] Instructions  [facility]  [address] [place], [place] [address]   Phone: [phone]  Fax: [phone]       Dominique Johnston    11/25/1945    MRN: [id number]       Procedure Day Dorna Bloom:  [name]  03/09/10     Arrival Time: 10:00AM     Procedure Time:  11:00AM     Location of Procedure:                    _ X_  [facility] (4th Floor)  PREPARATION FOR COLONOSCOPY WITH MIRALAX  Starting 5 days prior to your procedure 03/04/10 do not eat nuts, seeds, popcorn, corn, beans, peas,  salads, or any raw vegetables.  Do not take any fiber supplements (e.g. Metamucil, Citrucel, and Benefiber). ____________________________________________________________________________________________________   THE DAY BEFORE YOUR PROCEDURE         DATE: 03/08/10  DAY:  THURSDAY  1   Drink clear liquids the entire day-NO SOLID FOOD  2   Do not drink anything colored red or purple.  Avoid juices with pulp.  No orange juice.  3   Drink at least 64 oz. (8 glasses) of fluid/clear liquids during the day to prevent dehydration and help the prep work efficiently.  CLEAR LIQUIDS INCLUDE: Water Jello Ice Popsicles Tea (sugar ok, no milk/cream) Powdered fruit flavored drinks Coffee (sugar ok, no milk/cream) Gatorade Juice: apple, white grape, white cranberry  Lemonade Clear bullion, consomm, broth Carbonated beverages (any kind) Strained chicken noodle soup Hard Candy  4   Mix the entire bottle of Miralax with 64 oz. of Gatorade/Powerade in the morning and put in the refrigerator to chill.  5   At 3:00 pm take 2 Dulcolax/Bisacodyl tablets.  6   At 4:30 pm take one Reglan/Metoclopramide tablet.  7  Starting at 5:00 pm drink one 8 oz glass of the Miralax mixture every 15-20 minutes until you have finished drinking the entire 64 oz.  You should finish drinking prep around 7:30 or 8:00 pm.  8   If you are nauseated, you may take the 2nd  Reglan/Metoclopramide tablet at 6:30 pm.        9    At 8:00 pm take 2 more DULCOLAX/Bisacodyl tablets.    THE DAY OF YOUR PROCEDURE      DATE:  03/09/10  DAY: [name]  You may drink clear liquids until 9:00AM  (2 HOURS BEFORE PROCEDURE).   MEDICATION INSTRUCTIONS  Unless otherwise instructed, you should take regular prescription medications with a small sip of water as early as possible the morning of your procedure.        OTHER INSTRUCTIONS  You will need a responsible adult at least 65 years of age to accompany you and drive you home.   This person must remain in the waiting room during your procedure.  Wear loose fitting clothing that is easily removed.  Leave jewelry and other valuables at home.  However, you may wish to bring a book to read or an iPod/MP3 player to listen to music as you wait for your procedure to start.  Remove all body piercing jewelry and leave at home.  Total time from sign-in until discharge is approximately 2-3 hours.  You should go home directly after your procedure and rest.  You can resume normal activities the day after your procedure.  The day of your procedure you should not:   Drive   Make legal  decisions   Operate machinery   Drink alcohol   Return to work  You will receive specific instructions about eating, activities and medications before you leave.   The above instructions have been reviewed and explained to me by   Wyona Almas RN  February 23, 2010 1:28 PM     I fully understand and can verbalize these instructions _____________________________ Date _______

## 2010-06-12 NOTE — Progress Notes (Signed)
Summary: Refill request  Phone Note Refill Request Call back at [phone] Message from:  Pharmacy on December 01, 2008 1:58 PM  Refills Requested: Medication #1:  ALPRAZOLAM 0.5 MG  TABS 1 by mouth three times a day prn   Last Refilled: 11/29/2008   Notes: Rx written 04-20-08 [id number] requesting refill. Last Office Visit 11-30-2008. Okay to refill?    Initial call taken by: Beola Cord, CMA,  December 01, 2008 2:00 PM     Appended Document: Refill request Signed by mistake. Will send new request for Doctor.

## 2010-06-12 NOTE — Progress Notes (Signed)
Summary: Refill request  Phone Note Refill Request Message from:  Pharmacy on December 01, 2008 2:04 PM  Refills Requested: Medication #1:  ALPRAZOLAM 0.5 MG  TABS 1 by mouth three times a day prn   Last Refilled: 11/29/2008   Notes: Rx written 04-20-08 [facility] requesting refill. Last Office Visit 11-30-2008. Okay to refill?  Initial call taken by: Beola Cord, CMA,  December 01, 2008 2:05 PM  Follow-up for Phone Call        msg says last refill July 20th. Why new Rx today? if this is a retroactive approval ../. OK Follow-up by: Jacques Navy MD,  December 01, 2008 4:37 PM  Additional Follow-up for Phone Call Additional follow up Details #1::        Spoke with pharm, last filled 07/2008. Gave verbal ok to pharm Additional Follow-up by: Lamar Sprinkles,  December 01, 2008 6:12 PM    Prescriptions: ALPRAZOLAM 0.5 MG  TABS (ALPRAZOLAM) 1 by mouth three times a day prn  #90 x 3   Entered by:   Lamar Sprinkles   Authorized by:   Jacques Navy MD   Signed by:   Lamar Sprinkles on 12/01/2008   Method used:   Telephoned to ...       [facility] (retail)       [address]       [place], [place]  [address]       Ph: [phone]       Fax: [phone]   RxID:   [id number]

## 2010-06-12 NOTE — Assessment & Plan Note (Signed)
Summary: Body aches,headache,asthma/stress/$50/CD   Vital Signs:  Patient Profile:   65 Years Old Female Height:     67 inches Weight:      197 pounds BMI:     30.97 Temp:     97.4 degrees F oral Pulse rate:   73 / minute BP sitting:   164 / 90  (left arm) Cuff size:   regular  Vitals Entered By: Zackery Barefoot CMA (December 03, 2007 4:55 PM)                 PCP:  [facility]  Chief Complaint:  Headaches/anxiety/increased stress/cough.  History of Present Illness: Stressed and depressed. Has been given notice in November her job goes away to [employer]: She is tearful, not sleeping well, frustrated with Headaches and other medical problems. She is taking zoloft and xanax.  She is having more trouble with cough, asthma. She needs a referral to pulmonary.  She has had swallowing difficulty. she has an appointment with Dr. Matthias Hughs.    Updated Prior Medication List: SYMBICORT 160-4.5 MCG/ACT  AERO (BUDESONIDE-FORMOTEROL FUMARATE) 2 puffs two times a day PULMICORT 0.25 MG/2ML  SUSP (BUDESONIDE (INHALATION)) nebulizer as needed XOPENEX 1.25 MG/3ML  NEBU (LEVALBUTEROL HCL) nebulizer as needed DIOVAN 320 MG  TABS (VALSARTAN) 1 once daily ASTEPRO 137 MCG/SPRAY  SOLN (AZELASTINE HCL) AS DIRECTED ZYRTEC ALLERGY 10 MG  TABS (CETIRIZINE HCL) 1  once daily ACCOLATE 20 MG  TABS (ZAFIRLUKAST) 1 once daily NEXIUM 40 MG  CPDR (ESOMEPRAZOLE MAGNESIUM) 1 two times a day FUROSEMIDE 40 MG  TABS (FUROSEMIDE) 1 once daily ZOLOFT 100 MG  TABS (SERTRALINE HCL) 1 1/2 once daily FLONASE 50 MCG/ACT  SUSP (FLUTICASONE PROPIONATE) as directed MOTRIN IB 200 MG  TABS (IBUPROFEN) 800 mg as needed XANAX 0.25 MG  TABS (ALPRAZOLAM) 1 as needed LIBRAX 2.5-5 MG  CAPS (CLIDINIUM-CHLORDIAZEPOXIDE) 1 three times a day * IMITREX as needed PROVENTIL HFA 108 (90 BASE) MCG/ACT  AERS (ALBUTEROL SULFATE) as needed * MULTIVITAMIN once daily * IRON daily * VITAMIN C daily * B12 daily MUCINEX 600 MG  TB12  (GUAIFENESIN) Take 1 tablet by mouth once a day KLOR-CON 8 MEQ TBCR (POTASSIUM CHLORIDE) Take 1 tablet by mouth once a day  Current Allergies: ! BACTRIM     Review of Systems       The patient complains of weight gain, dyspnea on exertion, peripheral edema, prolonged cough, headaches, difficulty walking, and depression.  The patient denies anorexia, fever, decreased hearing, chest pain, syncope, abdominal pain, hematochezia, severe indigestion/heartburn, incontinence, and muscle weakness.     Physical Exam  General:     overweight AA female, emotionally distraught and tearful. Head:     normocephalic and atraumatic.   Lungs:     normal respiratory effort, no intercostal retractions, and no accessory muscle use.  Feint end-expiratory wheeze  in the upper fields. Heart:     normal rate.   Psych:     depressed, tearful, hopeless feeling and frustrated.    Impression & Recommendations:  Problem # 1:  ASTHMA (ICD-493.90) Pat[place] is requesting a pulmonary consult to reassess her condition and medical regimen.  Plan: refer to first available pulmonologist.  Her updated medication list for this problem includes:    Symbicort 160-4.5 Mcg/act Aero (Budesonide-formoterol fumarate) .Marland Kitchen... 2 puffs two times a day    Pulmicort 0.25 Mg/47ml Susp (Budesonide (inhalation)) ..... Nebulizer as needed    Xopenex 1.25 Mg/62ml Nebu (Levalbuterol hcl) ..... Nebulizer as needed  Accolate 20 Mg Tabs (Zafirlukast) .Marland Kitchen... 1 once daily    Proventil Hfa 108 (90 Base) Mcg/act Aers (Albuterol sulfate) .Marland Kitchen... 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  Plan: continue zoloft at present dose. Increase Alprazolam to 0.5 mg three times a day as needed.  Complete Medication List: 1)  Symbicort 160-4.5 Mcg/act  Aero (Budesonide-formoterol fumarate) .... 2 puffs two times a day 2)  Pulmicort 0.25 Mg/19ml Susp (Budesonide (inhalation)) .... Nebulizer as needed 3)  Xopenex 1.25 Mg/58ml Nebu (Levalbuterol hcl) .... Nebulizer as needed 4)  Diovan 320 Mg Tabs (Valsartan) .Marland Kitchen.. 1 once daily 5)  Astepro 137 Mcg/spray Soln (Azelastine hcl) .... As directed 6)  Zyrtec Allergy 10 Mg Tabs (Cetirizine hcl) .Marland Kitchen.. 1  once daily 7)  Accolate 20 Mg Tabs (Zafirlukast) .Marland Kitchen.. 1 once daily 8)  Nexium 40 Mg Cpdr (Esomeprazole magnesium) .Marland Kitchen.. 1 two times a day 9)  Furosemide 40 Mg Tabs (Furosemide) .Marland Kitchen.. 1 once daily 10)  Zoloft 100 Mg Tabs (Sertraline hcl) .Marland Kitchen.. 1 1/2 once daily 11)  Flonase 50 Mcg/act Susp (Fluticasone propionate) .... As directed 12)  Motrin Ib 200 Mg Tabs (Ibuprofen) .... 800 mg as needed 13)  Alprazolam 0.5 Mg Tabs (Alprazolam) .Marland Kitchen.. 1 by mouth three times a day prn 14)  Librax 2.5-5 Mg Caps (Clidinium-chlordiazepoxide) .Marland Kitchen.. 1 three times a day 15)  Imitrex  .... As needed 16)  Proventil Hfa 108 (90 Base) Mcg/act Aers (Albuterol sulfate) .... As needed 17)  Multivitamin  .... Once daily 18)  Iron  .... Daily 19)  Vitamin C  .... Daily 20)  B12  .... Daily 21)  Mucinex 600 Mg Tb12 (Guaifenesin) .... Take 1 tablet by mouth once a day 22)  Klor-con 8 Meq Tbcr (Potassium chloride) .... Take 1 tablet by mouth once a day    Prescriptions: ALPRAZOLAM 0.5 MG  TABS (ALPRAZOLAM) 1 by mouth three times a day prn  #90 x 5   Entered and Authorized by:   Jacques Navy MD   Signed by:   Jacques Navy MD on 12/03/2007   Method used:   Print then Give to Patient   RxID:   [id number]  ]

## 2010-06-12 NOTE — Letter (Signed)
Summary: [facility]  [facility]   Imported By: Lester [place] 11/08/2008 08:49:55  _____________________________________________________________________  External Attachment:    Type:   Image     Comment:   External Document

## 2010-06-12 NOTE — Letter (Signed)
Summary: Pre Visit Letter Revised  [facility]  [address] [place], [place] [address]   Phone: [phone]  Fax: [phone]        01/19/2010 MRN: [id number] Dominique Johnston [address] [place]  [address]             Procedure Date:  Oct 28 at 11am   Welcome to the Gastroenterology Division at [facility].    You are scheduled to see a nurse for your pre-procedure visit on [id number] at 1pm on the 3rd floor at [facility], [address]  We ask that you try to arrive at our office 15 minutes prior to your appointment time to allow for check-in.  Please take a minute to review the attached form.  If you answer "Yes" to one or more of the questions on the first page, we ask that you call the person listed at your earliest opportunity.  If you answer "No" to all of the questions, please complete the rest of the form and bring it to your appointment.    Your nurse visit will consist of discussing your medical and surgical history, your immediate family medical history, and your medications.   If you are unable to list all of your medications on the form, please bring the medication bottles to your appointment and we will list them.  We will need to be aware of both prescribed and over the counter drugs.  We will need to know exact dosage information as well.    Please be prepared to read and sign documents such as consent forms, a financial agreement, and acknowledgement forms.  If necessary, and with your consent, a friend or relative is welcome to sit-in on the nurse visit with you.  Please bring your insurance card so that we may make a copy of it.  If your insurance requires a referral to see a specialist, please bring your referral form from your primary care physician.  No co-pay is required for this nurse visit.     If you cannot keep your appointment, please call [phone] to cancel or reschedule prior to your appointment  date.  This allows [place] the opportunity to schedule an appointment for another patient in need of care.    Thank you for choosing [facility] for your medical needs.  We appreciate the opportunity to care for you.  Please visit [place] at our website  to learn more about our practice.  Sincerely, The Gastroenterology Division

## 2010-06-12 NOTE — Assessment & Plan Note (Signed)
Summary: PHYSICAL  STC   Vital Signs:  Patient profile:   65 year old female Height:      66 inches Weight:      204 pounds BMI:     33.05 O2 Sat:      99 % on Room air Temp:     97.8 degrees F oral Pulse rate:   71 / minute BP sitting:   142 / 92  (left arm) Cuff size:   large  Vitals Entered By: Bill Salinas CMA (July 26, 2009 10:18 AM)  O2 Flow:  Room air CC: pt here for physical, she questions her last tetanus (chart being ordered), pt has never had zostavax (will check insurance for coverage),  she sees Dr Genice Rouge for gyn/ ab  Vision Screening:      Vision Comments: last eye exam was Feb 2011, 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 Dr Eulah Pont  Vision Entered By: Bill Salinas CMA (July 26, 2009 10:22 AM)   Primary Care Provider:  Cherrill Scrima  CC:  pt here for physical, she questions her last tetanus (chart being ordered), pt has never had zostavax (will check insurance for coverage), and she sees Dr Genice Rouge for gyn/ ab.  History of Present Illness: Patient presents for routine medical follow-up. She has recently seen Dr. [place] Callas - she is doing OK. She has been switched Xyzal. She has had continued "migraines." She reports that she has been tired a lot and stays cold a lot.  she has no other c/o other than minor arthiritic pains.   Current Medications (verified): 1)  Symbicort 160-4.5 Mcg/act  Aero (Budesonide-Formoterol Fumarate) .... 2 Puffs Two Times A Day 2)  Xopenex 1.25 Mg/79ml  Nebu (Levalbuterol Hcl) .... Nebulizer As Needed 3)  Cozaar 100 Mg Tabs (Losartan Potassium) .Marland Kitchen.. 1 Once Daily 4)  Astepro 137 Mcg/spray  Soln (Azelastine Hcl) .... As Directed 5)  Xyzal 5 Mg Tabs (Levocetirizine Dihydrochloride) .... As Needed 6)  Accolate 20 Mg  Tabs (Zafirlukast) .Marland Kitchen.. 1 Once Daily 7)  Omeprazole 40 Mg Cpdr (Omeprazole) .Marland Kitchen.. 1 By Mouth Once Daily 8)  Zoloft 100 Mg  Tabs (Sertraline Hcl) .Marland Kitchen.. 1 1/2 Once Daily 9)  Fluticasone Propionate 50 Mcg/act Susp (Fluticasone Propionate) .Marland Kitchen.. 1  Spray/nares Once Daily 10)  Ibuprofen 800 Mg Tabs (Ibuprofen) .... Take 1 Tablet By Mouth Once A Day As Needed 11)  Alprazolam 0.5 Mg  Tabs (Alprazolam) .Marland Kitchen.. 1 By Mouth Three Times A Day Prn 12)  Librax 2.5-5 Mg  Caps (Clidinium-Chlordiazepoxide) .Marland Kitchen.. 1 Three Times A Day 13)  Imitrex .... As Needed 14)  Multivitamin .... Once Daily 15)  Iron .... Daily 16)  Vitamin C .... Daily 17)  B12 .... Daily 18)  Mucinex 600 Mg  Tb12 (Guaifenesin) .... Take 1 Tablet By Mouth Once A Day 19)  Medroxyprogesterone Acetate 2.5 Mg  Tabs (Medroxyprogesterone Acetate) .... Take One By Mouth As Needed 20)  Estradiol 1 Mg  Tabs (Estradiol) .... Take One Tablet By Mouth Daily. 21)  Xalatan 0.005 % Soln (Latanoprost) .Marland Kitchen.. 1 Drop Both Eyes Daily  Allergies (verified): 1)  ! Bactrim 2)  ! 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 Lives alone   Past Surgical History: Lumpectomy Tubal ligation Tonsillectomy septoplasty transnasal laryngoscopy ORIF left ankle (done in [place]) '07  Review of Systems  The patient denies anorexia, fever, weight loss, decreased hearing, hoarseness, chest pain, peripheral edema, prolonged cough, hemoptysis, abdominal pain, melena, hematochezia, muscle weakness, transient blindness, difficulty walking, abnormal bleeding, angioedema, and breast masses.    Physical Exam  General:  Well groomed AA female looking younger than her stated age,  overweight. Head:  normocephalic, atraumatic, and no abnormalities palpated.   Eyes:  vision grossly intact, pupils equal, pupils round, corneas and lenses clear, and no injection.   Ears:  R ear normal, L ear normal, and no external deformities.   Nose:  no external deformity and no external erythema.   Mouth:  extensive partial upper, native dentition lower, no gingival disease, no palatal or buccal lesions, posterior pharynx clear Neck:  supple, full ROM, no thyromegaly, no thyroid nodules or tenderness, and no carotid bruits.   Chest Wall:  no deformities and no tenderness.   Breasts:  No mass, nodules, thickening, tenderness, bulging, retraction, inflamation, nipple discharge or skin changes noted.   Lungs:  Normal respiratory effort, chest expands symmetrically. Lungs are clear to auscultation, no crackles or wheezes. Heart:  Normal rate and regular rhythm. S1 and S2 normal without gallop, murmur, click, rub or other extra sounds. Abdomen:  soft, non-tender, normal bowel sounds, no guarding, no rigidity, and no hepatomegaly.   Genitalia:  deferred Msk:  normal ROM, no joint tenderness, no joint swelling, no joint warmth, and no redness over joints.   Pulses:  2+ radial pulses Extremities:  No clubbing, cyanosis, edema, or deformity noted with normal full range of motion of all joints.   Neurologic:  No cranial nerve deficits noted. Station and gait are normal. Plantar reflexes are down-going bilaterally. DTRs are symmetrical throughout. Sensory, motor and coordinative functions appear intact. Skin:  turgor normal, color normal, no rashes, and no suspicious lesions.   Cervical Nodes:  no anterior cervical adenopathy and no posterior cervical adenopathy.   Axillary Nodes:  no R axillary adenopathy and no L axillary adenopathy.   Psych:  Oriented X3, memory intact for recent and remote, normally interactive, good eye contact, and not anxious appearing.     Impression &  Recommendations:  Problem # 1:  G E REFLUX (ICD-530.81) Stable on her present medications  Her updated medication list for this problem includes:    Omeprazole 40 Mg Cpdr (Omeprazole) .Marland Kitchen... 1 by mouth once daily    Librax 2.5-5 Mg Caps (Clidinium-chlordiazepoxide) .Marland Kitchen... 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 Dellia Cloud. She reports that she is doing OK. Her daughter remains at home but it is going OK   Problem # 3:  ASTHMA (ICD-493.90) Stable on her present medications. She follow routinely with Dr. Carmi Callas  Her updated medication list for this problem includes:    Symbicort 160-4.5 Mcg/act Aero (Budesonide-formoterol fumarate) .Marland Kitchen... 2 puffs two times a day    Xopenex 1.25 Mg/37ml Nebu (Levalbuterol hcl) ..... Nebulizer as needed    Accolate 20 Mg Tabs (Zafirlukast) .Marland Kitchen... 1 once daily  Problem # 4:  HYPERTENSION (ICD-401.9)  Her updated medication list for this problem includes:    Cozaar 100  Mg Tabs (Losartan potassium) .Marland Kitchen... 1 once daily  BP today: 142/92 Prior BP: 138/84 (01/26/2009)    Labs Reviewed: K+: 3.8 (07/19/2009)  Adequate control. she is encouraged to exercise on a regular basis.   Problem # 5:  HEADACHE (ICD-784.0) Patient with HA pain over frontal and maxillary sinus c/w sinus congestion headache.  Plan - OK to take pseudoephedrine 30mg  two times a day for up to three days as needed for sinus congestion HA  Her updated medication list for this problem includes:    Ibuprofen 800 Mg Tabs (Ibuprofen) .Marland Kitchen... Take 1 tablet by mouth once a day as needed  Problem # 6:  Preventive Health Care (ICD-V70.0) Lat PAP '10 -normal history. Recommended q 3 yr exam. Normal breast exam today with normal mammogram in July '10. Last colonoscopy '04-due for follow-up '14. Tetnus and Zostavax today. She had pneumovax in '08.   Lipids reviewed: cholesterol 226, HDL 58.9 and LDL  150.8. Did Framingham cardiac risk calculation - 4% 10 year risk with treatment threshold of LDL > 160. She is referred to [email or web address] gov for information on Low fat diet and she is encouraged to resume an active exercise program. Will have repeat lipid panel in 6 months.  In summary: a very nice woman with multiple medical problems who appears to be stable at this time. she will return as needed.   Complete Medication List: 1)  Symbicort 160-4.5 Mcg/act Aero (Budesonide-formoterol fumarate) .... 2 puffs two times a day 2)  Xopenex 1.25 Mg/75ml Nebu (Levalbuterol hcl) .... Nebulizer as needed 3)  Cozaar 100 Mg Tabs (Losartan potassium) .Marland Kitchen.. 1 once daily 4)  Astepro 137 Mcg/spray Soln (Azelastine hcl) .... As directed 5)  Xyzal 5 Mg Tabs (Levocetirizine dihydrochloride) .... As needed 6)  Accolate 20 Mg Tabs (Zafirlukast) .Marland Kitchen.. 1 once daily 7)  Omeprazole 40 Mg Cpdr (Omeprazole) .Marland Kitchen.. 1 by mouth once daily 8)  Zoloft 100 Mg Tabs (Sertraline hcl) .Marland Kitchen.. 1 1/2 once daily 9)  Fluticasone Propionate 50 Mcg/act Susp (Fluticasone propionate) .Marland Kitchen.. 1 spray/nares once daily 10)  Ibuprofen 800 Mg Tabs (Ibuprofen) .... Take 1 tablet by mouth once a day as needed 11)  Alprazolam 0.5 Mg Tabs (Alprazolam) .Marland Kitchen.. 1 by mouth three times a day prn 12)  Librax 2.5-5 Mg Caps (Clidinium-chlordiazepoxide) .Marland Kitchen.. 1 three times a day 13)  Imitrex  .... As needed 14)  Multivitamin  .... Once daily 15)  Iron  .... Daily 16)  Vitamin C  .... Daily 17)  B12  .... Daily 18)  Mucinex 600 Mg Tb12 (Guaifenesin) .... Take 1 tablet by mouth once a day 19)  Medroxyprogesterone Acetate 2.5 Mg Tabs (Medroxyprogesterone acetate) .... Take one by mouth as needed 20)  Estradiol 1 Mg Tabs (Estradiol) .... Take one tablet by mouth daily. 21)  Xalatan 0.005 % Soln (Latanoprost) .Marland Kitchen.. 1 drop both eyes daily  Other Orders: Zoster (Shingles) Vaccine Live [id number]) Admin 1st Vaccine (60454)   Patient: Dominique Johnston Note: All result  statuses are Final unless otherwise noted.  Tests: (1) Lipid Panel (LIPID)   Cholesterol          [H]  226 mg/dL                   0-981     ATP III Classification            Desirable:  < 200 mg/dL  Borderline High:  200 - 239 mg/dL               High:  > = 240 mg/dL   Triglycerides             94.0 mg/dL                  4.7-829.5     Normal:  <150 mg/dL     Borderline High:  621 - 199 mg/dL   HDL                       30.86 mg/dL                 >57.84   VLDL Cholesterol          18.8 mg/dL                  6.9-62.9  CHO/HDL Ratio:  CHD Risk                             4                    Men          Women     1/2 Average Risk     3.4          3.3     Average Risk          5.0          4.4     2X Average Risk          9.6          7.1     3X Average Risk          15.0          11.0                           Tests: (2) BMP (METABOL)   Sodium                    143 mEq/L                   135-145   Potassium                 3.8 mEq/L                   3.5-5.1   Chloride                  110 mEq/L                   96-112   Carbon Dioxide            29 mEq/L                    19-32   Glucose                   92 mg/dL                    52-84   BUN                       9 mg/dL  6-23   Creatinine                1.1 mg/dL                   1.6-1.0   Calcium                   9.2 mg/dL                   9.6-04.5   GFR                       64.40 mL/min                >60  Tests: (3) CBC Platelet w/Diff (CBCD)   White Cell Count     [L]  4.0 K/uL                    4.5-10.5   Red Cell Count       [H]  5.20 Mil/uL                 3.87-5.11   Hemoglobin                12.1 g/dL                   40.9-81.1   Hematocrit                39.2 %                      36.0-46.0   MCV                  [L]  75.3 fl                     78.0-100.0   MCHC                      30.9 L g/dL                 91.4-78.2   RDW                  [H]  14.9 %                       11.5-14.6   Platelet Count            207.0 K/uL                  150.0-400.0   Neutrophil %              46.7 %                      43.0-77.0   Lymphocyte %              37.7 %                      12.0-46.0   Monocyte %                11.9 %                      3.0-12.0   Eosinophils%              3.1 %  0.0-5.0   Basophils %               0.6 %                       0.0-3.0   Neutrophill Absolute      1.9 K/uL                    1.4-7.7   Lymphocyte Absolute       1.5 K/uL                    0.7-4.0   Monocyte Absolute         0.5 K/uL                    0.1-1.0  Eosinophils, Absolute                             0.1 K/uL                    0.0-0.7   Basophils Absolute        0.0 K/uL                    0.0-0.1  Tests: (4) Hepatic/Liver Function Panel (HEPATIC)   Total Bilirubin           0.5 mg/dL                   0.4-5.4   Direct Bilirubin          0.1 mg/dL                   0.9-8.1   Alkaline Phosphatase      71 U/L                      39-117   AST                       25 U/L                      0-37   ALT                       19 U/L                      0-35   Total Protein             7.6 g/dL                    1.9-1.4   Albumin                   3.9 g/dL                    7.8-2.9  Tests: (5) TSH (TSH)   FastTSH                   2.49 uIU/mL                 0.35-5.50  Tests: (6) UDip w/Micro (URINE)   Color                     LT. YELLOW       RANGE:  Yellow;Lt.  Yellow   Clarity                   CLEAR                       Clear   Specific Gravity          1.015                       1.000 - 1.030   Urine Ph                  6.0                         5.0-8.0   Protein                   NEGATIVE                    Negative   Urine Glucose             NEGATIVE                    Negative   Ketones                   NEGATIVE                    Negative   Urine Bilirubin           NEGATIVE                    Negative   Blood                      NEGATIVE                    Negative   Urobilinogen              0.2                         0.0 - 1.0   Leukocyte Esterace        MODERATE                    Negative   Nitrite                   NEGATIVE                    Negative   Urine WBC                 7-10/hpf                    0-2/hpf   Urine Epith               Rare(0-4/hpf)               Rare(0-4/hpf)   Urine Bacteria            Few(10-50/hpf)              None  Tests: (7) Cholesterol LDL - Direct (DIRLDL)  Cholesterol LDL - Direct                             150.8 mg/dL  Optimal:  <100 mg/dL     Near or Above Optimal:  100-129 mg/dL     Borderline High:  161-096 mg/dL     High:  045-409 mg/dL     Very High:  >811 mg/dL   Preventive Care Screening  Last Pneumovax:    Date:  05/29/2005    Results:  given    Immunizations Administered:  Zostavax # 1:    Vaccine Type: Zostavax    Site: left arm    Mfr: Merck    Dose: 0.5 ml    Route: [place]    Given by: Ami Bullins CMA    Exp. Date: 06/09/2010    Lot #: [id number]    VIS given: 02/22/05 given July 26, 2009.

## 2010-06-12 NOTE — Progress Notes (Signed)
Summary: Directions  Phone Note From Pharmacy   Summary of Call: Pharm is req rx details for nasarel. Found generic in EMR, please give directions. Thanks Initial call taken by: Lamar Sprinkles,  October 14, 2008 11:08 AM  Follow-up for Phone Call        2 sprays per nostril two times a day. Follow-up by: Jacques Navy MD,  October 14, 2008 6:13 PM    New/Updated Medications: FLUNISOLIDE 29 MCG/ACT SOLN (FLUNISOLIDE)  FLUNISOLIDE 29 MCG/ACT SOLN (FLUNISOLIDE) 2 sprays per nostril two times a day   Prescriptions: FLUNISOLIDE 29 MCG/ACT SOLN (FLUNISOLIDE) 2 sprays per nostril two times a day  #1 mth x 6   Entered by:   Lamar Sprinkles   Authorized by:   Jacques Navy MD   Signed by:   Lamar Sprinkles on 10/18/2008   Method used:   Faxed to ...       [facility] (retail)       [address]       [place], [place]  [address]       Ph: [phone]       Fax: [phone]   RxID:   [id number]

## 2010-06-12 NOTE — Assessment & Plan Note (Signed)
   Vital Signs:  Patient Profile:   65 Years Old Female Weight:      189 pounds Temp:     98.7 degrees F oral Pulse rate:   68 / minute BP sitting:   156 / 83  (left arm)                 Current Allergies: ! BACTRIM        Complete Medication List: 1)  Symbicort 160-4.5 Mcg/act Aero (Budesonide-formoterol fumarate) .... 2 puffs two times a day 2)  Pulmicort 0.25 Mg/69ml Susp (Budesonide (inhalation)) .... Nebulizer as needed 3)  Xopenex 1.25 Mg/11ml Nebu (Levalbuterol hcl) .... Nebulizer as needed 4)  Diovan 320 Mg Tabs (Valsartan) .Marland Kitchen.. 1 once daily 5)  Astelin 137 Mcg/spray Soln (Azelastine hcl) .... Once daily 6)  Zyrtec Allergy 10 Mg Tabs (Cetirizine hcl) .Marland Kitchen.. 1 1/2 once daily 7)  Accolate 20 Mg Tabs (Zafirlukast) .Marland Kitchen.. 1 once daily 8)  Nexium 40 Mg Cpdr (Esomeprazole magnesium) .Marland Kitchen.. 1 two times a day 9)  Furosemide 40 Mg Tabs (Furosemide) .Marland Kitchen.. 1 once daily 10)  Zoloft 100 Mg Tabs (Sertraline hcl) .Marland Kitchen.. 1 1/2 once daily 11)  Aerohist 8-2.5 Mg Tb12 (Chlorpheniramine-methscop) .Marland Kitchen.. 1 two times a day 12)  Nasarel 29 Mcg/act Soln (Flunisolide) .... Once daily 13)  Motrin Ib 200 Mg Tabs (Ibuprofen) .... 800 mg as needed 14)  Xanax 0.25 Mg Tabs (Alprazolam) .Marland Kitchen.. 1 as needed 15)  Librax 2.5-5 Mg Caps (Clidinium-chlordiazepoxide) .Marland Kitchen.. 1 three times a day 16)  Imitrex  .... As needed 17)  Femhrt 1/5 1-5 Mg-mcg Tabs (Norethindrone-eth estradiol) .Marland Kitchen.. 1 once daily 18)  Proventil Hfa 108 (90 Base) Mcg/act Aers (Albuterol sulfate) .... As needed 19)  Multivitamin  .... Once daily 20)  Iron  .... Daily 21)  Vitamin C  .... Daily 22)  B12  .... Daily 23)  Potassium  .... Daily     ]

## 2010-06-12 NOTE — Progress Notes (Signed)
  Phone Note Refill Request Message from:  Fax from Pharmacy on August 14, 2009 11:14 AM  Refills Requested: Medication #1:  ALPRAZOLAM 0.5 MG  TABS 1 by mouth three times a day prn   Last Refilled: 12/01/2008 received fax from [facility], please advise refill.  Initial call taken by: Ami Bullins CMA,  August 14, 2009 11:15 AM  Follow-up for Phone Call        ok x 5 Follow-up by: Jacques Navy MD,  August 14, 2009 12:22 PM    Prescriptions: ALPRAZOLAM 0.5 MG  TABS (ALPRAZOLAM) 1 by mouth three times a day prn  #90 x 5   Entered by:   Ami Bullins CMA   Authorized by:   Jacques Navy MD   Signed by:   Bill Salinas CMA on 08/14/2009   Method used:   Telephoned to ...       [facility] (retail)       [address]       [place], [place]  [address]       Ph: [phone]       Fax: [phone]   RxID:   [id number]

## 2010-06-12 NOTE — Assessment & Plan Note (Signed)
Summary: FLU SHOT-MEN--STC  Nurse Visit   Vital Signs:  Patient Profile:   65 Years Old Female Height:     67 inches Temp:     97.8 degrees F oral  Vitals Entered By: Windell Norfolk (February 23, 2008 1:24 PM)                 Prior Medications: SYMBICORT 160-4.5 MCG/ACT  AERO (BUDESONIDE-FORMOTEROL FUMARATE) 2 puffs two times a day PULMICORT 0.25 MG/2ML  SUSP (BUDESONIDE (INHALATION)) nebulizer as needed XOPENEX 1.25 MG/3ML  NEBU (LEVALBUTEROL HCL) nebulizer as needed DIOVAN 320 MG  TABS (VALSARTAN) 1 once daily ASTEPRO 137 MCG/SPRAY  SOLN (AZELASTINE HCL) AS DIRECTED ZYRTEC ALLERGY 10 MG  TABS (CETIRIZINE HCL) 1  once daily ACCOLATE 20 MG  TABS (ZAFIRLUKAST) 1 once daily NEXIUM 40 MG  CPDR (ESOMEPRAZOLE MAGNESIUM) 1 two times a day FUROSEMIDE 40 MG  TABS (FUROSEMIDE) 1 once daily ZOLOFT 100 MG  TABS (SERTRALINE HCL) 1 1/2 once daily FLONASE 50 MCG/ACT  SUSP (FLUTICASONE PROPIONATE) as directed MOTRIN IB 200 MG  TABS (IBUPROFEN) 800 mg as needed ALPRAZOLAM 0.5 MG  TABS (ALPRAZOLAM) 1 by mouth three times a day prn LIBRAX 2.5-5 MG  CAPS (CLIDINIUM-CHLORDIAZEPOXIDE) 1 three times a day IMITREX () as needed PROVENTIL HFA 108 (90 BASE) MCG/ACT  AERS (ALBUTEROL SULFATE) as needed MULTIVITAMIN () once daily IRON () daily VITAMIN C () daily B12 () daily MUCINEX 600 MG  TB12 (GUAIFENESIN) Take 1 tablet by mouth once a day KLOR-CON 8 MEQ TBCR (POTASSIUM CHLORIDE) Take 1 tablet by mouth once a day MEDROXYPROGESTERONE ACETATE 2.5 MG  TABS (MEDROXYPROGESTERONE ACETATE) Take one by mouth as needed ESTRADIOL 1 MG  TABS (ESTRADIOL) Take one tablet by mouth daily. Current Allergies: ! BACTRIM    Orders Added: 1)  Admin 1st Vaccine [90471] 2)  Flu Vaccine 92yrs + [[address]]   Flu Vaccine Consent Questions     Do you have a history of severe allergic reactions to this vaccine? no    Any prior history of allergic reactions to egg and/or gelatin? no    Do you have a sensitivity to the  preservative Thimersol? no    Do you have a past history of Guillan-Barre Syndrome? no    Do you currently have an acute febrile illness? no    Have you ever had a severe reaction to latex? no    Vaccine information given and explained to patient? yes    Are you currently pregnant? no    Lot Number:[id number]   Site Given  Left Deltoid IM] .opcflu

## 2010-06-12 NOTE — Letter (Signed)
Summary: FMLA  FMLA   Imported By: Maryln Gottron 05/12/2007 14:36:42  _____________________________________________________________________  External Attachment:    Type:   Image     Comment:   External Document

## 2010-06-12 NOTE — Letter (Signed)
Summary: OV/[facility]  OV/[facility]   Imported By: Sherian Rein 05/01/2009 08:20:34  _____________________________________________________________________  External Attachment:    Type:   Image     Comment:   External Document

## 2010-06-12 NOTE — Assessment & Plan Note (Signed)
Summary: f/u appt / #/ cd   Vital Signs:  Patient profile:   65 year old female Height:      66 inches Weight:      196 pounds BMI:     31.75 O2 Sat:      98 % on Room air Temp:     97.9 degrees F oral Pulse rate:   74 / minute BP sitting:   160 / 104  (left arm) Cuff size:   regular  Vitals Entered By: Alysia Penna (January 19, 2010 1:15 PM)  O2 Flow:  Room air CC: pt in for follow-up.  Comments pt wants to schedule colonoscopy. brother has had 5 polyps removed recently.    Primary Care Provider:  Norins  CC:  pt in for follow-up. Marland Kitchen  History of Present Illness: Patinet presents to reveiw recent labs. She has been on a diet and has lost 12lb since 2022-08-12!!  She does report that she has been having headaches, a little dizziness. Her BP was 140/92 last visit and today is 160/104  Current Medications (verified): 1)  Symbicort 160-4.5 Mcg/act  Aero (Budesonide-Formoterol Fumarate) .... 2 Puffs Two Times A Day 2)  Xopenex 1.25 Mg/26ml  Nebu (Levalbuterol Hcl) .... Nebulizer As Needed 3)  Cozaar 100 Mg Tabs (Losartan Potassium) .Marland Kitchen.. 1 Once Daily 4)  Astepro 137 Mcg/spray  Soln (Azelastine Hcl) .... As Directed 5)  Xyzal 5 Mg Tabs (Levocetirizine Dihydrochloride) .... As Needed 6)  Accolate 20 Mg  Tabs (Zafirlukast) .Marland Kitchen.. 1 Once Daily 7)  Omeprazole 40 Mg Cpdr (Omeprazole) .Marland Kitchen.. 1 By Mouth Once Daily 8)  Zoloft 100 Mg  Tabs (Sertraline Hcl) .Marland Kitchen.. 1 1/2 Once Daily 9)  Fluticasone Propionate 50 Mcg/act Susp (Fluticasone Propionate) .Marland Kitchen.. 1 Spray/nares Once Daily 10)  Ibuprofen 800 Mg Tabs (Ibuprofen) .... Take 1 Tablet By Mouth Once A Day As Needed 11)  Alprazolam 0.5 Mg  Tabs (Alprazolam) .Marland Kitchen.. 1 By Mouth Three Times A Day Prn 12)  Librax 2.5-5 Mg  Caps (Clidinium-Chlordiazepoxide) .Marland Kitchen.. 1 Three Times A Day 13)  Imitrex .... As Needed 14)  Multivitamin .... Once Daily 15)  Iron .... Daily 16)  Vitamin C .... Daily 17)  B12 .... Daily 18)  Mucinex 600 Mg  Tb12 (Guaifenesin) ....  Take 1 Tablet By Mouth Once A Day 19)  Medroxyprogesterone Acetate 2.5 Mg  Tabs (Medroxyprogesterone Acetate) .... Take One By Mouth As Needed 20)  Estradiol 1 Mg  Tabs (Estradiol) .... Take One Tablet By Mouth Daily. 21)  Xalatan 0.005 % Soln (Latanoprost) .Marland Kitchen.. 1 Drop Both Eyes Daily  Allergies (verified): 1)  ! Bactrim 2)  ! Enalapril Maleate  Past History:  Past Medical History: Last updated: 10/27/2007 Allergic rhinitis Headache Hypertension Asthma Recurrent bronchitis  Past Surgical History: Last updated: 07/26/2009 Lumpectomy Tubal ligation Tonsillectomy septoplasty transnasal laryngoscopy ORIF left ankle (done in [place]) '07  Family History: Last updated: 08/12/07 father - deceased @ 4: CAD/MI fatal, HTN mother- 71; good health Neg- beast or colon cancer, DM  Social History: Last updated: 07/25/2008 [name] [facility], has had additional course work married '71- after 29 years husband left, divorce final in '06 1 son- '74, 1 daughter '76; 1 grandchild 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 Lives alone   Review of Systems       The patient complains of weight gain.  The patient denies anorexia, fever, chest pain, syncope, dyspnea on exertion, prolonged cough, abdominal pain,  severe indigestion/heartburn, incontinence, muscle weakness, difficulty walking, depression, and enlarged lymph nodes.    Physical Exam  General:  overweight AA female well groomed and looking younger than her stated age.  Head:  normocephalic and atraumatic.   Eyes:  pupils equal and pupils round.  C&S clear Neck:  supple.   Lungs:  normal respiratory effort and normal breath sounds.   Heart:  normal rate and regular rhythm.   Neurologic:  alert & oriented X3, cranial nerves II-XII intact, and gait normal.   Skin:  turgor normal and color normal.   Cervical Nodes:  no anterior cervical adenopathy and no posterior cervical adenopathy.     Psych:  Oriented X3, memory intact for recent and remote, normally interactive, and good eye contact.     Impression & Recommendations:  Problem # 1:  ASTHMA (ICD-493.90) Very stable at this time. She will continue to follow with her asthma/allergy specialist  Her updated medication list for this problem includes:    Symbicort 160-4.5 Mcg/act Aero (Budesonide-formoterol fumarate) .Marland Kitchen... 2 puffs two times a day    Xopenex 1.25 Mg/52ml Nebu (Levalbuterol hcl) ..... Nebulizer as needed    Accolate 20 Mg Tabs (Zafirlukast) .Marland Kitchen... 1 once daily  Problem # 2:  HYPERTENSION (ICD-401.9)  Her updated medication list for this problem includes:    Exforge 5-160 Mg Tabs (Amlodipine besylate-valsartan) .Marland Kitchen... 1 by mouth once daily    Furosemide 40 Mg Tabs (Furosemide) .Marland Kitchen... 1 by mouth once daily  BP today: 160/104 Prior BP: 142/92 (07/26/2009)  Prior 10 Yr Risk Heart Disease: 17 % (07/25/2008)  Labs Reviewed: K+: 3.8 (07/19/2009) Creat: : 1.1 (07/19/2009)   Soboptimal control in a setting of multiple cardiac risk factors.  Plan - add amlodipine to her regimen by changing for losartan to Exforge (valsartan + amlodipine)           f/u BP check  Problem # 3:  HYPERLIPIDEMIA (ICD-272.4) Lab with good HDL but elevated LDL at 145+  Plan - life-stle management with no medication at this time.            She will need follow-up lab in 6 months.   Problem # 4:  Preventive Health Care (ICD-V70.0) will refer for routine colonoscopy.   Complete Medication List: 1)  Symbicort 160-4.5 Mcg/act Aero (Budesonide-formoterol fumarate) .... 2 puffs two times a day 2)  Xopenex 1.25 Mg/36ml Nebu (Levalbuterol hcl) .... Nebulizer as needed 3)  Exforge 5-160 Mg Tabs (Amlodipine besylate-valsartan) .Marland Kitchen.. 1 by mouth once daily 4)  Astepro 137 Mcg/spray Soln (Azelastine hcl) .... As directed 5)  Xyzal 5 Mg Tabs (Levocetirizine dihydrochloride) .... As needed 6)  Accolate 20 Mg Tabs (Zafirlukast) .Marland Kitchen.. 1 once  daily 7)  Omeprazole 40 Mg Cpdr (Omeprazole) .Marland Kitchen.. 1 by mouth once daily 8)  Zoloft 100 Mg Tabs (Sertraline hcl) .Marland Kitchen.. 1 1/2 once daily 9)  Fluticasone Propionate 50 Mcg/act Susp (Fluticasone propionate) .Marland Kitchen.. 1 spray/nares once daily 10)  Ibuprofen 800 Mg Tabs (Ibuprofen) .... Take 1 tablet by mouth once a day as needed 11)  Alprazolam 0.5 Mg Tabs (Alprazolam) .Marland Kitchen.. 1 by mouth three times a day prn 12)  Librax 2.5-5 Mg Caps (Clidinium-chlordiazepoxide) .Marland Kitchen.. 1 three times a day 13)  Imitrex  .... As needed 14)  Multivitamin  .... Once daily 15)  Iron  .... Daily 16)  Vitamin C  .... Daily 17)  B12  .... Daily 18)  Mucinex 600 Mg Tb12 (Guaifenesin) .... Take 1 tablet by mouth once a  day 19)  Medroxyprogesterone Acetate 2.5 Mg Tabs (Medroxyprogesterone acetate) .... Take one by mouth as needed 20)  Estradiol 1 Mg Tabs (Estradiol) .... Take one tablet by mouth daily. 21)  Xalatan 0.005 % Soln (Latanoprost) .Marland Kitchen.. 1 drop both eyes daily 22)  Klor-con 8 Meq Cr-tabs (Potassium chloride) .Marland Kitchen.. 1 by mouth once daily 23)  Furosemide 40 Mg Tabs (Furosemide) .Marland Kitchen.. 1 by mouth once daily  Other Orders: Gastroenterology Referral (GI)

## 2010-06-12 NOTE — Assessment & Plan Note (Signed)
Summary: men/flu/cd----rs'd/cd  Nurse Visit   Vital Signs:  Patient profile:   65 year old female Temp:     98.4 degrees F (36.89 degrees C) oral  Vitals Entered By: Orlan Leavens (March 06, 2009 11:25 AM)  Allergies: 1)  ! Bactrim 2)  ! Enalapril Maleate  Orders Added: 1)  Admin 1st Vaccine [90471] 2)  Flu Vaccine 36yrs + [16109] Flu Vaccine Consent Questions     Do you have a history of severe allergic reactions to this vaccine? no    Any prior history of allergic reactions to egg and/or gelatin? no    Do you have a sensitivity to the preservative Thimersol? no    Do you have a past history of Guillan-Barre Syndrome? no    Do you currently have an acute febrile illness? no    Have you ever had a severe reaction to latex? no    Vaccine information given and explained to patient? yes    Are you currently pregnant? no    Lot Number:[id number]   Exp Date:11/09/2009   Site Given  Left Deltoid IM .lbflu

## 2010-06-12 NOTE — Assessment & Plan Note (Signed)
Summary: asthma///kp   Visit Type:  Initial Consult Referred by:  Dr. Debby Bud PCP:  Norins  Chief Complaint:  fatigue, throat swells, hoarse, coughing spells, and (borderline glaucoma).  History of Present Illness: Referred for evaluation of asthma. 65/f  has a long-standing history of severe allergies with frequent URIs & rhino-sinusitis. Trigers include weather changes, esp muggy weather, stress, smoke , infections & exercise.  She has had multiple visits to Dr. Sidney Ace & takes allergy shots.She had been seen at [facility], Department of Otolaryngology, because of her sinus problems.  She did undergo transnasal fiberoptic laryngoscopy with dysphonia.  It was felt the patient had significant reflux and irritation, and her proton pump inhibitor was doubled to Nexium 40 mg b.i.d.   She sounds nasally congested -all the time. Medication history - symbicort seems to work better than advair - addition of pulmicort when symptoms worse has helped. Last oral steroids 6/09 - short course of 10 mg x  5ds for poorly resolving bronchitis. singulair caused all posssible side effects including insomnia & migraines - accolate works much better. Uses xopenex as needed only- about once/ month     Updated Prior Medication List: SYMBICORT 160-4.5 MCG/ACT  AERO (BUDESONIDE-FORMOTEROL FUMARATE) 2 puffs two times a day PULMICORT 0.25 MG/2ML  SUSP (BUDESONIDE (INHALATION)) nebulizer as needed XOPENEX 1.25 MG/3ML  NEBU (LEVALBUTEROL HCL) nebulizer as needed DIOVAN 320 MG  TABS (VALSARTAN) 1 once daily ASTEPRO 137 MCG/SPRAY  SOLN (AZELASTINE HCL) AS DIRECTED ZYRTEC ALLERGY 10 MG  TABS (CETIRIZINE HCL) 1  once daily ACCOLATE 20 MG  TABS (ZAFIRLUKAST) 1 once daily NEXIUM 40 MG  CPDR (ESOMEPRAZOLE MAGNESIUM) 1 two times a day FUROSEMIDE 40 MG  TABS (FUROSEMIDE) 1 once daily ZOLOFT 100 MG  TABS (SERTRALINE HCL) 1 1/2 once daily FLONASE 50 MCG/ACT  SUSP (FLUTICASONE PROPIONATE) as  directed MOTRIN IB 200 MG  TABS (IBUPROFEN) 800 mg as needed ALPRAZOLAM 0.5 MG  TABS (ALPRAZOLAM) 1 by mouth three times a day prn LIBRAX 2.5-5 MG  CAPS (CLIDINIUM-CHLORDIAZEPOXIDE) 1 three times a day * IMITREX as needed PROVENTIL HFA 108 (90 BASE) MCG/ACT  AERS (ALBUTEROL SULFATE) as needed * MULTIVITAMIN once daily * IRON daily * VITAMIN C daily * B12 daily MUCINEX 600 MG  TB12 (GUAIFENESIN) Take 1 tablet by mouth once a day KLOR-CON 8 MEQ TBCR (POTASSIUM CHLORIDE) Take 1 tablet by mouth once a day MEDROXYPROGESTERONE ACETATE 2.5 MG  TABS (MEDROXYPROGESTERONE ACETATE) Take one by mouth as needed ESTRADIOL 1 MG  TABS (ESTRADIOL) Take one tablet by mouth daily.  Current Allergies: ! BACTRIM  Past Medical History:    Reviewed history from 10/27/2007 and no changes required:       Allergic rhinitis       Headache       Hypertension       Asthma       Recurrent bronchitis  Past Surgical History:    Reviewed history from 12/01/2006 and no changes required:       Lumpectomy       Tubal ligation       Tonsillectomy       septoplasty       transnasal laryngoscopy   Family History:    Reviewed history from 07/23/2007 and no changes required:       father - deceased @ 38: CAD/MI fatal, HTN       mother- 63; good health       Neg- beast or colon cancer, DM  Social History:    Reviewed history from 07/23/2007 and no changes required:       [name] -[employer], has had additional course work       married '71- after 29 years husband left, divorce final in '06       1 son- '74, 1 daughter '76; 1 grandchild       work: Technical sales engineer of [place]       Lives alone     Review of Systems  The patient denies anorexia, fever, weight loss, weight gain, vision loss, decreased hearing, hoarseness, chest pain, syncope, dyspnea on exertion, peripheral edema, prolonged cough, headaches, hemoptysis, abdominal pain, melena, hematochezia, severe indigestion/heartburn, hematuria, incontinence, genital sores,  muscle weakness, suspicious skin lesions, transient blindness, difficulty walking, depression, unusual weight change, abnormal bleeding, enlarged lymph nodes, angioedema, breast masses, and testicular masses.     Vital Signs:  Patient Profile:   65 Years Old Female Height:     67 inches Weight:      196.50 pounds O2 Sat:      100 % O2 treatment:    Room Air Temp:     97.9 degrees F oral Pulse rate:   78 / minute BP sitting:   146 / 96  (left arm) Cuff size:   regular  Vitals Entered By: Marinus Maw (December 10, 2007 3:00 PM)             Comments Medications reviewed with patient Marinus Maw  December 10, 2007 3:02 PM  .       Pulmonary Function Test Date: 12/10/2007 Height (in.): 50 Gender: Female  Pre-Spirometry FVC    Value: 2.71 L/min   Pred: 3.43 L/min     % Pred: 79 % FEV1    Value: 2.15 L     Pred: 2.66 L     % Pred: 81 % FEV1/FVC  Value: 0.79 %     Pred: 0.77 %    FEF 25-75  Value: 1.94 L/min   Pred: 2.46 L/min       Problem # 1:  ASTHMA (ICD-493.90) Spirometry normal today . I do not think we need to step up asthma therapy. Her triggers seem to be predominantly rhino-sinusitis & GERD.  I am not sure we can eliminate any of her meds at the present time. I discussed a plan with her , should she develop bronchospasm any time in the future - I think she may very well require orl steroids 1-2 times/ yr - if we pick up symptoms early, hopefully these would be very short courses 5-7 days. I do not think Geoffry Paradise is necessary here. Her updated medication list for this problem includes:    Symbicort 160-4.5 Mcg/act Aero (Budesonide-formoterol fumarate) .Marland Kitchen... 2 puffs two times a day    Pulmicort 0.25 Mg/17ml Susp (Budesonide (inhalation)) ..... Nebulizer as needed    Xopenex 1.25 Mg/74ml Nebu (Levalbuterol hcl) ..... Nebulizer as needed    Accolate 20 Mg Tabs (Zafirlukast) .Marland Kitchen... 1 once daily    Proventil Hfa 108 (90 Base) Mcg/act Aers (Albuterol sulfate) .Marland Kitchen... 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 Her updated medication list for this problem includes:    Astepro 137 Mcg/spray Soln (Azelastine hcl) .Marland Kitchen... As directed    Zyrtec Allergy 10 Mg Tabs (Cetirizine hcl) .Marland Kitchen... 1  once daily    Flonase 50 Mcg/act Susp (Fluticasone  propionate) .Marland Kitchen... As directed    Mucinex 600 Mg Tb12 (Guaifenesin) .Marland Kitchen... Take 1 tablet by mouth once a day   Problem # 3:  G E REFLUX (ICD-530.81)  Her updated medication list for this problem includes:    Nexium 40 Mg Cpdr (Esomeprazole magnesium) .Marland Kitchen... 1 two times a day    Librax 2.5-5 Mg Caps (Clidinium-chlordiazepoxide) .Marland Kitchen... 1 three times a day   Medications Added to Medication List This Visit: 1)  Medroxyprogesterone Acetate 2.5 Mg Tabs (Medroxyprogesterone acetate) .... Take one by mouth as needed 2)  Estradiol 1 Mg Tabs (Estradiol) .... Take one tablet by mouth daily.   Patient Instructions: 1)  Please schedule a follow-up appointment in 4 months. 2)  Call us sooner if you need [place] 3)  Your lung function studies were good today.   ]  SPIROMETRY RESULTS  Date:12/10/2007 height inches: 67 Gender: Female  Parameter  Measured Predicted %Predicted  FVC      2.71        3.43      79  FEV1      2.15        2.66      81  FEV1/FVC      0.79        0.77      103  FEF25-75      1.94        2.46      79  PEF      7.76        402      2  Post-Bronchodilator  Parameter Measured Change from Baseline  FVC               FEV1               FEV1/FVC                FEF25-75                PEF               Pulse Oximetry Pulse: 78 O2 Saturation %: 100

## 2010-06-12 NOTE — Assessment & Plan Note (Signed)
Summary: FU ON MEDS/NWS $50   Vital Signs:  Patient profile:   65 year old female Height:      67 inches Weight:      203 pounds BMI:     31.91 O2 Sat:      99 % on Room air Temp:     99.1 degrees F oral Pulse rate:   74 / minute BP sitting:   160 / 70  (left arm) Cuff size:   large  Vitals Entered By: Beola Cord, CMA (November 03, 2008 1:13 PM)  O2 Flow:  Room air  Primary Care Provider:  [facility]   History of Present Illness: Dominique Johnston yesterday and has a sore neck and back. No LOC. She slipped on a wet floor. NO severe pain. No decreased ROM.  Needs to switch to generic meds.   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 2 Puffs Two Times A Day 2)  Pulmicort 0.25 Mg/63ml  Susp (Budesonide (Inhalation)) .... Nebulizer As Needed 3)  Xopenex 1.25 Mg/5ml  Nebu (Levalbuterol Hcl) .... Nebulizer As Needed 4)  Diovan 320 Mg  Tabs (Valsartan) .Marland Kitchen.. 1 Once Daily 5)  Astepro 137 Mcg/spray  Soln (Azelastine Hcl) .... As Directed 6)  Zyrtec Allergy 10 Mg  Tabs (Cetirizine Hcl) .Marland Kitchen.. 1  Once Daily 7)  Accolate 20 Mg  Tabs (Zafirlukast) .Marland Kitchen.. 1 Once Daily 8)  Nexium 40 Mg  Cpdr (Esomeprazole Magnesium) .Marland Kitchen.. 1 Two Times A Day 9)  Furosemide 40 Mg  Tabs  (Furosemide) .Marland Kitchen.. 1 Once Daily 10)  Zoloft 100 Mg  Tabs (Sertraline Hcl) .Marland Kitchen.. 1 1/2 Once Daily 11)  Flonase 50 Mcg/act  Susp (Fluticasone Propionate) .... As Directed 12)  Ibuprofen 800 Mg Tabs (Ibuprofen) .... Take 1 Tablet By Mouth Once A Day As Needed 13)  Alprazolam 0.5 Mg  Tabs (Alprazolam) .Marland Kitchen.. 1 By Mouth Three Times A Day Prn 14)  Librax 2.5-5 Mg  Caps (Clidinium-Chlordiazepoxide) .Marland Kitchen.. 1 Three Times A Day 15)  Imitrex .... As Needed 16)  Proventil Hfa 108 (90 Base) Mcg/act  Aers (Albuterol Sulfate) .... As Needed 17)  Multivitamin .... Once Daily 18)  Iron .... Daily 19)  Vitamin C .... Daily 20)  B12 .... Daily 21)  Mucinex 600 Mg  Tb12 (Guaifenesin) .... Take 1 Tablet By Mouth Once A Day 22)  Klor-Con 8 Meq Tbcr (Potassium Chloride) .... Take 1 Tablet By Mouth Once A Day 23)  Medroxyprogesterone Acetate 2.5 Mg  Tabs (Medroxyprogesterone Acetate) .... Take One By Mouth As Needed 24)  Estradiol 1 Mg  Tabs (Estradiol) .... Take One Tablet By Mouth Daily. 25)  Flunisolide 29 Mcg/act Soln (Flunisolide) .... 2 Sprays Per Nostril Two Times A Day  Allergies (verified): 1)  ! Bactrim  Family History: Reviewed history from 07/23/2007 and no changes required. father -  deceased @ 54: CAD/MI fatal, HTN mother- 64; good health Neg- beast or colon cancer, DM  Social History: Reviewed history from 07/25/2008 and no changes required. [name] -BS, has had additional course work married '71- after 29 years husband left, divorce final in '06 1 son- '74, 1 daughter '76; 1 grandchild work: [employer]-laid off '10 - currently on benefits and will look for part-time. Lives alone   Review of Systems  The patient denies anorexia, fever, weight loss, weight gain, vision loss, decreased hearing, chest pain, syncope, peripheral edema, headaches, abdominal pain, incontinence, muscle weakness, unusual weight change, and angioedema.    Physical Exam  General:  alert, well-developed, and  well-nourished.   Head:  normocephalic and atraumatic.   Lungs:  no intercostal retractions, normal breath sounds, no crackles, and no wheezes.   Heart:  normal rate, regular rhythm, no murmur, and no HJR.   Msk:  normal ROM, no joint tenderness, and no joint swelling.  No CVAT. MAE to command. Nl SL raise.   Impression & Recommendations:  Problem # 1:  ACUTE BRONCHITIS (ICD-466.0) Stable  Her updated medication list for this problem includes:    Symbicort 160-4.5 Mcg/act Aero (Budesonide-formoterol fumarate) .Marland Kitchen... 2 puffs two times a day    Pulmicort 0.25 Mg/36ml Susp (Budesonide (inhalation)) ..... Nebulizer as needed    Xopenex 1.25 Mg/40ml Nebu (Levalbuterol hcl) ..... Nebulizer as needed    Accolate 20 Mg Tabs (Zafirlukast) .Marland Kitchen... 1 once daily    Proventil Hfa 108 (90 Base) Mcg/act Aers (Albuterol sulfate) .Marland Kitchen... As needed    Mucinex 600 Mg Tb12 (Guaifenesin) .Marland Kitchen... Take 1 tablet by mouth once a day  Problem # 2:  NECK PAIN, ACUTE (ICD-723.1) Normal exam without any evidence of major injury.  Her updated medication list for this problem includes:    Ibuprofen 800 Mg Tabs (Ibuprofen) .Marland Kitchen... Take 1 tablet by mouth once a day as needed  Complete Medication List: 1)  Symbicort 160-4.5 Mcg/act Aero (Budesonide-formoterol fumarate) .... 2 puffs two times a day 2)  Pulmicort 0.25 Mg/11ml Susp (Budesonide (inhalation)) .... Nebulizer as needed 3)  Xopenex 1.25 Mg/70ml Nebu (Levalbuterol hcl) .... Nebulizer as needed 4)  Enalapril Maleate 10 Mg Tabs (Enalapril maleate) .Marland Kitchen.. 1 by mouth once daily 5)  Astepro 137 Mcg/spray Soln (Azelastine hcl) .... As directed 6)  Zyrtec Allergy 10 Mg Tabs (Cetirizine hcl) .Marland Kitchen.. 1  once daily 7)  Accolate 20 Mg Tabs (Zafirlukast) .Marland Kitchen.. 1 once daily 8)  Ranitidine Hcl 150 Mg Tabs (Ranitidine hcl) .Marland Kitchen.. 1 by mouth two times a day 9)  Furosemide 40 Mg Tabs (Furosemide) .Marland Kitchen.. 1 once daily 10)  Zoloft 100 Mg Tabs (Sertraline hcl) .Marland Kitchen.. 1 1/2 once daily 11)  Fluticasone  Propionate 50 Mcg/act Susp (Fluticasone propionate) .Marland Kitchen.. 1 spray/nares once daily 12)  Ibuprofen 800 Mg Tabs (Ibuprofen) .... Take 1 tablet by mouth once a day as needed 13)  Alprazolam 0.5 Mg Tabs (Alprazolam) .Marland Kitchen.. 1 by mouth three times a day prn 14)  Librax 2.5-5 Mg Caps (Clidinium-chlordiazepoxide) .Marland Kitchen.. 1 three times a day 15)  Imitrex  .... As needed 16)  Proventil Hfa 108 (90 Base) Mcg/act Aers (Albuterol sulfate) .... As needed 17)  Multivitamin  .... Once daily 18)  Iron  .... Daily 19)  Vitamin C  .... Daily 20)  B12  .... Daily 21)  Mucinex 600 Mg Tb12 (Guaifenesin) .... Take 1 tablet by mouth once a day 22)  Klor-con 8 Meq Tbcr (Potassium chloride) .... Take 1  tablet by mouth once a day 23)  Medroxyprogesterone Acetate 2.5 Mg Tabs (Medroxyprogesterone acetate) .... Take one by mouth as needed 24)  Estradiol 1 Mg Tabs (Estradiol) .... Take one tablet by mouth daily. Prescriptions: RANITIDINE HCL 150 MG TABS (RANITIDINE HCL) 1 by mouth two times a day  #90 x 3   Entered and Authorized by:   Jacques Navy MD   Signed by:   Jacques Navy MD on 11/03/2008   Method used:   Electronically to        [facility] (retail)       [address]       [place], [place]  [address]       Ph: [phone]       Fax: [phone]   RxID:   [id number] LIBRAX 2.5-5 MG  CAPS (CLIDINIUM-CHLORDIAZEPOXIDE) 1 three times a day  #90 Capsule x 4   Entered and Authorized by:   Jacques Navy MD   Signed by:   Jacques Navy MD on 11/03/2008   Method used:   Electronically to        [facility] (retail)       [address]       [place], [place]  [address]       Ph: [phone]       Fax: [phone]   RxID:   [id number] FLUTICASONE PROPIONATE 50 MCG/ACT SUSP (FLUTICASONE PROPIONATE) 1 spray/nares once daily  #1 bottle x 12   Entered and Authorized by:   Jacques Navy MD   Signed by:   Jacques Navy MD on 11/03/2008   Method used:   Electronically to        [facility] (retail)       [address]       [place], [place]  [address]       Ph: [phone]       Fax: [phone]   RxID:   [id number] ENALAPRIL MALEATE 10 MG TABS (ENALAPRIL MALEATE) 1 by mouth once daily  #90 x 3   Entered and Authorized by:   Jacques Navy MD   Signed by:   Jacques Navy MD on 11/03/2008   Method used:   Electronically to        [facility] (retail)       [address]       [place], [place]  [address]       Ph: [phone]       Fax: [phone]   RxID:   [id number]

## 2010-06-12 NOTE — Progress Notes (Signed)
Summary: Needs apt   Phone Note Call from Patient   Summary of Call: Patient is requesting an apt to see Dr Debby Bud, c/o cough, thinks bronchitis, chills. Pt needs apt soon, possibly tomorrow early am if available. Will wait to hear about am schedule Initial call taken by: Lamar Sprinkles,  October 26, 2007 3:17 PM  Follow-up for Phone Call        coming in tomorrow am Follow-up by: Lamar Sprinkles,  October 26, 2007 3:51 PM

## 2010-06-12 NOTE — Progress Notes (Signed)
Summary: Refill--Librax  Phone Note Refill Request Message from:  Fax from Pharmacy on January 02, 2010 8:32 AM  Refills Requested: Medication #1:  LIBRAX 2.5-5 MG  CAPS 1 three times a day Please advise.  Initial call taken by: Lucious Groves CMA,  January 02, 2010 8:32 AM  Follow-up for Phone Call        ok for refill as needed  Follow-up by: Jacques Navy MD,  January 02, 2010 1:15 PM    Prescriptions: LIBRAX 2.5-5 MG  CAPS (CLIDINIUM-CHLORDIAZEPOXIDE) 1 three times a day  #90 Capsule x 1   Entered by:   Ami Bullins CMA   Authorized by:   Jacques Navy MD   Signed by:   Bill Salinas CMA on 01/03/2010   Method used:   Electronically to        [facility] (retail)       [address]       [place], [place]  [address]       Ph: [phone]       Fax: [phone]   RxID:   [id number]

## 2010-06-12 NOTE — Progress Notes (Signed)
Summary: FMLA update  Medications Added KLOR-CON 8 MEQ TBCR (POTASSIUM CHLORIDE)        Phone Note From Other Clinic   Caller: [employer] [phone] Summary of Call: Please update for pt's FMLA. They need approx number of days per month and duration (max ) Initial call taken by: Lamar Sprinkles,  April 24, 2007 1:46 PM  Follow-up for Phone Call        completed FMLA in detail at Ms. Mccadden's office visit this week. Have the received that form? If it needs further revision then Ms Pester needs to bring the form in and go over it with me. Follow-up by: Jacques Navy MD,  April 24, 2007 5:56 PM  Additional Follow-up for Phone Call Additional follow up Details #1::        Returned call to [facility] and advised per MD. They did receive the FMLA form but have to have a duration. They would like to know if ok to say visits as needed in a 30day period x or a year  Additional Follow-up by: Rock Nephew CMA,  April 28, 2007 10:54 AM    Additional Follow-up for Phone Call Additional follow up Details #2::    I called [employer] today.  Additional Follow-up for Phone Call Additional follow up Details #3:: Details for Additional Follow-up Action Taken: Lf mess for pt Additional Follow-up by: Lamar Sprinkles,  May 11, 2007 10:22 AM  New/Updated Medications: KLOR-CON 8 MEQ TBCR (POTASSIUM CHLORIDE)

## 2010-06-12 NOTE — Assessment & Plan Note (Signed)
Summary: 2 wk f/u from BP check/cd   Vital Signs:  Patient profile:   65 year old female Height:      66 inches Weight:      206.50 pounds O2 Sat:      98 % Temp:     97.1 degrees F oral Pulse rate:   78 / minute BP sitting:   138 / 84  (left arm) Cuff size:   regular CC: 2 wk F/U of BP   Primary Care Provider:  Lakeia Bradshaw  CC:  2 wk F/U of BP.  History of Present Illness: Patient returns for follow-up:   she had been tried on ranitidine which did not control her reflux. She  is doing better on omeprazole 40mg .  Patient also for f/u on BP: Her generic cozaar was increased to 100mg  and she continued on furosemide. Her BP is better.   Allergy flare.  Allergies (verified): 1)  ! Bactrim 2)  ! Enalapril Maleate PMH-FH-SH reviewed for relevance  Review of Systems  The patient denies anorexia, fever, weight loss, weight gain, decreased hearing, chest pain, dyspnea on exertion, peripheral edema, headaches, hemoptysis, melena, hematochezia, muscle weakness, difficulty walking, and abnormal bleeding.    Physical Exam  General:  Well-developed,well-nourished,in no acute distress; alert,appropriate and cooperative throughout examination Head:  normocephalic, atraumatic, and no abnormalities observed.   Ears:  External ear exam shows no significant lesions or deformities.  Otoscopic examination reveals clear canals, tympanic membranes are intact bilaterally without bulging, retraction, inflammation or discharge. Hearing is grossly normal bilaterally. Mouth:  posterior pharynx clear Neck:  supple and full ROM.   Lungs:  normal respiratory effort, no intercostal retractions, no accessory muscle use, normal breath sounds, no crackles, and no wheezes.   Heart:  normal rate and regular rhythm.     Impression & Recommendations:  Problem # 1:  HYPERTENSION (ICD-401.9) Better control on increased Cozaar.  Plan - continue present meds.   Her updated medication list for this problem  includes:    Cozaar 100 Mg Tabs (Losartan potassium) .Marland Kitchen... 1 once daily    Furosemide 40 Mg Tabs (Furosemide) .Marland Kitchen... 1 once daily  Problem # 2:  ALLERGIC RHINITIS (ICD-477.9) Having a flare of allergy symptoms including fatigue.  Plan - per Dr. De Kalb Callas  Her updated medication list for this problem includes:    Astepro 137 Mcg/spray Soln (Azelastine hcl) .Marland Kitchen... As directed    Zyrtec Allergy 10 Mg Tabs (Cetirizine hcl) .Marland Kitchen... 1  once daily    Fluticasone Propionate 50 Mcg/act Susp (Fluticasone propionate) .Marland Kitchen... 1 spray/nares once daily  Complete Medication List: 1)  Symbicort 160-4.5 Mcg/act Aero (Budesonide-formoterol fumarate) .... 2 puffs two times a day 2)  Xopenex 1.25 Mg/24ml Nebu (Levalbuterol hcl) .... Nebulizer as needed 3)  Cozaar 100 Mg Tabs (Losartan potassium) .Marland Kitchen.. 1 once daily 4)  Astepro 137 Mcg/spray Soln (Azelastine hcl) .... As directed 5)  Zyrtec Allergy 10 Mg Tabs (Cetirizine hcl) .Marland Kitchen.. 1  once daily 6)  Accolate 20 Mg Tabs (Zafirlukast) .Marland Kitchen.. 1 once daily 7)  Omeprazole 40 Mg Cpdr (Omeprazole) .Marland Kitchen.. 1 by mouth once daily 8)  Furosemide 40 Mg Tabs (Furosemide) .Marland Kitchen.. 1 once daily 9)  Zoloft 100 Mg Tabs (Sertraline hcl) .Marland Kitchen.. 1 1/2 once daily 10)  Fluticasone Propionate 50 Mcg/act Susp (Fluticasone propionate) .Marland Kitchen.. 1 spray/nares once daily 11)  Ibuprofen 800 Mg Tabs (Ibuprofen) .... Take 1 tablet by mouth once a day as needed 12)  Alprazolam 0.5 Mg Tabs (Alprazolam) .Marland Kitchen.. 1 by mouth  three times a day prn 13)  Librax 2.5-5 Mg Caps (Clidinium-chlordiazepoxide) .Marland Kitchen.. 1 three times a day 14)  Imitrex  .... As needed 15)  Multivitamin  .... Once daily 16)  Iron  .... Daily 17)  Vitamin C  .... Daily 18)  B12  .... Daily 19)  Mucinex 600 Mg Tb12 (Guaifenesin) .... Take 1 tablet by mouth once a day 20)  Klor-con 8 Meq Tbcr (Potassium chloride) .... Take 1 tablet by mouth once a day 21)  Medroxyprogesterone Acetate 2.5 Mg Tabs (Medroxyprogesterone acetate) .... Take one by mouth as  needed 22)  Estradiol 1 Mg Tabs (Estradiol) .... Take one tablet by mouth daily. Prescriptions: OMEPRAZOLE 40 MG CPDR (OMEPRAZOLE) 1 by mouth once daily  #30 x 12   Entered and Authorized by:   Jacques Navy MD   Signed by:   Jacques Navy MD on 01/26/2009   Method used:   Electronically to        [facility] (retail)       [address]       [place], [place]  [address]       Ph: [phone]       Fax: [phone]   RxID:   [id number]

## 2010-06-12 NOTE — Assessment & Plan Note (Signed)
Summary: DR MEN PT -CONGESTION  HEAD ACHE  STC   Vital Signs:  Patient profile:   65 year old female Height:      67 inches Weight:      203 pounds BMI:     31.91 O2 Sat:      97 % on Room air Temp:     98.2 degrees F oral Pulse rate:   76 / minute Pulse rhythm:   regular BP sitting:   160 / 94  (left arm) Cuff size:   large  Vitals Entered By: Rock Nephew CMA (November 30, 2008 8:19 AM)  O2 Flow:  Room air CC: congestion, headache, Hypertension Management   Primary Care Provider:  Norins  CC:  congestion, headache, and Hypertension Management.  History of Present Illness: She returns for f/up. She tried to switch all meds to generics so she took a few doses of Enalapril last week and developed cough and swollen lips. She stopped taking it and symptoms have resolved. She will start Cozaar soon.  Hypertension History:      She denies headache, chest pain, palpitations, dyspnea with exertion, orthopnea, PND, peripheral edema, visual symptoms, neurologic problems, and syncope.        Positive major cardiovascular risk factors include female age 75 years old or older and hypertension.  Negative major cardiovascular risk factors include no history of diabetes or hyperlipidemia, negative family history for ischemic heart disease, and non-tobacco-user status.        Further assessment for target organ damage reveals no history of ASHD, cardiac end-organ damage (CHF/LVH), stroke/TIA, peripheral vascular disease, renal insufficiency, or hypertensive retinopathy.     Current Medications (verified): 1)  Symbicort 160-4.5 Mcg/act  Aero (Budesonide-Formoterol Fumarate) .... 2 Puffs Two Times A Day 2)  Pulmicort 0.25 Mg/65ml  Susp (Budesonide (Inhalation)) .... Nebulizer As Needed 3)  Xopenex 1.25 Mg/41ml  Nebu (Levalbuterol Hcl) .... Nebulizer As Needed 4)  Cozaar 50 Mg Tabs (Losartan Potassium) .Marland Kitchen.. 1 P Once Daily 5)  Astepro 137 Mcg/spray  Soln (Azelastine Hcl) .... As Directed 6)  Zyrtec  Allergy 10 Mg  Tabs (Cetirizine Hcl) .Marland Kitchen.. 1  Once Daily 7)  Accolate 20 Mg  Tabs (Zafirlukast) .Marland Kitchen.. 1 Once Daily 8)  Ranitidine Hcl 150 Mg Tabs (Ranitidine Hcl) .Marland Kitchen.. 1 By Mouth Two Times A Day 9)  Furosemide 40 Mg  Tabs (Furosemide) .Marland Kitchen.. 1 Once Daily 10)  Zoloft 100 Mg  Tabs (Sertraline Hcl) .Marland Kitchen.. 1 1/2 Once Daily 11)  Fluticasone Propionate 50 Mcg/act Susp (Fluticasone Propionate) .Marland Kitchen.. 1 Spray/nares Once Daily 12)  Ibuprofen 800 Mg Tabs (Ibuprofen) .... Take 1 Tablet By Mouth Once A Day As Needed 13)  Alprazolam 0.5 Mg  Tabs (Alprazolam) .Marland Kitchen.. 1 By Mouth Three Times A Day Prn 14)  Librax 2.5-5 Mg  Caps (Clidinium-Chlordiazepoxide) .Marland Kitchen.. 1 Three Times A Day 15)  Imitrex .... As Needed 16)  Proventil Hfa 108 (90 Base) Mcg/act  Aers (Albuterol Sulfate) .... As Needed 17)  Multivitamin .... Once Daily 18)  Iron .... Daily 19)  Vitamin C .... Daily 20)  B12 .... Daily 21)  Mucinex 600 Mg  Tb12 (Guaifenesin) .... Take 1 Tablet By Mouth Once A Day 22)  Klor-Con 8 Meq Tbcr (Potassium Chloride) .... Take 1 Tablet By Mouth Once A Day 23)  Medroxyprogesterone Acetate 2.5 Mg  Tabs (Medroxyprogesterone Acetate) .... Take One By Mouth As Needed 24)  Estradiol 1 Mg  Tabs (Estradiol) .... Take One Tablet By Mouth Daily.  Allergies: 1)  !  Bactrim 2)  ! Enalapril Maleate  Past History:  Past Medical History: Reviewed history from 10/27/2007 and no changes required. Allergic rhinitis Headache Hypertension Asthma Recurrent bronchitis  Past Surgical History: Reviewed history from 12/01/2006 and no changes required. Lumpectomy Tubal ligation Tonsillectomy septoplasty transnasal laryngoscopy  Family History: Reviewed history from 07/23/2007 and no changes required. father - deceased @ 19: CAD/MI fatal, HTN mother- 27; good health Neg- beast or colon cancer, DM  Social History: Reviewed history from 07/25/2008 and no changes required. Dominique Johnston -BS, has had additional course work married  '71- after 29 years husband left, divorce final in '06 1 son- '74, 1 daughter '76; 1 grandchild work: [employer]-laid off '10 - currently on benefits and will look for part-time. Lives alone   Review of Systems  The patient denies anorexia, fever, weight loss, chest pain, hemoptysis, abdominal pain, difficulty walking, depression, enlarged lymph nodes, and angioedema.    Physical Exam  General:  alert, well-developed, well-nourished, well-hydrated, normal appearance, healthy-appearing, cooperative to examination, and good hygiene.   Head:  normocephalic and atraumatic.   Eyes:  vision grossly intact, pupils equal, and pupils round.   Mouth:  Oral mucosa and oropharynx without lesions or exudates.  Teeth in good repair. Neck:  supple, full ROM, no masses, no carotid bruits, and no neck tenderness.   Lungs:  Normal respiratory effort, chest expands symmetrically. Lungs are clear to auscultation, no crackles or wheezes. Heart:  Normal rate and regular rhythm. S1 and S2 normal without gallop, murmur, click, rub or other extra sounds. Abdomen:  Bowel sounds positive,abdomen soft and non-tender without masses, organomegaly or hernias noted. Msk:  No deformity or scoliosis noted of thoracic or lumbar spine.   Pulses:  R and L carotid,radial,femoral,dorsalis pedis and posterior tibial pulses are full and equal bilaterally Extremities:  No clubbing, cyanosis, edema, or deformity noted with normal full range of motion of all joints.   Neurologic:  No cranial nerve deficits noted. Station and gait are normal. Plantar reflexes are down-going bilaterally. DTRs are symmetrical throughout. Sensory, motor and coordinative functions appear intact. Skin:  Intact without suspicious lesions or rashes Psych:  Cognition and judgment appear intact. Alert and cooperative with normal attention span and concentration. No apparent delusions, illusions, hallucinations   Impression & Recommendations:  Problem #  1:  HYPERTENSION (ICD-401.9) Assessment Unchanged  Her updated medication list for this problem includes:    Cozaar 50 Mg Tabs (Losartan potassium) .Marland Kitchen... 1 p once daily    Furosemide 40 Mg Tabs (Furosemide) .Marland Kitchen... 1 once daily  Problem # 2:  ADVERSE DRUG REACTION (ICD-995.20) Assessment: Improved avoid ACEI's  Complete Medication List: 1)  Symbicort 160-4.5 Mcg/act Aero (Budesonide-formoterol fumarate) .... 2 puffs two times a day 2)  Xopenex 1.25 Mg/33ml Nebu (Levalbuterol hcl) .... Nebulizer as needed 3)  Cozaar 50 Mg Tabs (Losartan potassium) .Marland Kitchen.. 1 p once daily 4)  Astepro 137 Mcg/spray Soln (Azelastine hcl) .... As directed 5)  Zyrtec Allergy 10 Mg Tabs (Cetirizine hcl) .Marland Kitchen.. 1  once daily 6)  Accolate 20 Mg Tabs (Zafirlukast) .Marland Kitchen.. 1 once daily 7)  Ranitidine Hcl 150 Mg Tabs (Ranitidine hcl) .Marland Kitchen.. 1 by mouth two times a day 8)  Furosemide 40 Mg Tabs (Furosemide) .Marland Kitchen.. 1 once daily 9)  Zoloft 100 Mg Tabs (Sertraline hcl) .Marland Kitchen.. 1 1/2 once daily 10)  Fluticasone Propionate 50 Mcg/act Susp (Fluticasone propionate) .Marland Kitchen.. 1 spray/nares once daily 11)  Ibuprofen 800 Mg Tabs (Ibuprofen) .... Take 1 tablet by mouth once  a day as needed 12)  Alprazolam 0.5 Mg Tabs (Alprazolam) .Marland Kitchen.. 1 by mouth three times a day prn 13)  Librax 2.5-5 Mg Caps (Clidinium-chlordiazepoxide) .Marland Kitchen.. 1 three times a day 14)  Imitrex  .... As needed 15)  Multivitamin  .... Once daily 16)  Iron  .... Daily 17)  Vitamin C  .... Daily 18)  B12  .... Daily 19)  Mucinex 600 Mg Tb12 (Guaifenesin) .... Take 1 tablet by mouth once a day 20)  Klor-con 8 Meq Tbcr (Potassium chloride) .... Take 1 tablet by mouth once a day 21)  Medroxyprogesterone Acetate 2.5 Mg Tabs (Medroxyprogesterone acetate) .... Take one by mouth as needed 22)  Estradiol 1 Mg Tabs (Estradiol) .... Take one tablet by mouth daily.  Hypertension Assessment/Plan:      The patient's hypertensive risk group is category B: At least one risk factor (excluding  diabetes) with no target organ damage.  Her calculated 10 year risk of coronary heart disease is 17 %.  Today's blood pressure is 160/94.  Her blood pressure goal is < 140/90.  Patient Instructions: 1)  Please schedule a follow-up appointment in 1 month. 2)  It is important that you exercise regularly at least 20 minutes 5 times a week. If you develop chest pain, have severe difficulty breathing, or feel very tired , stop exercising immediately and seek medical attention. 3)  You need to lose weight. Consider a lower calorie diet and regular exercise.  4)  Check your Blood Pressure regularly. If it is above 140/90: you should make an appointment.

## 2010-06-14 NOTE — Progress Notes (Signed)
Summary: RF - librax  Phone Note Refill Request Message from:  Pharmacy  Refills Requested: Medication #1:  LIBRAX 2.5-5 MG  CAPS 1 three times a day Fax from [facility] on [address], Please advise   Initial call taken by: Lamar Sprinkles, CMA,  May 21, 2010 9:07 AM  Follow-up for Phone Call        ok to refill x 5 Follow-up by: Jacques Navy MD,  May 21, 2010 1:09 PM    Prescriptions: LIBRAX 2.5-5 MG  CAPS (CLIDINIUM-CHLORDIAZEPOXIDE) 1 three times a day  #90 Capsule x 5   Entered by:   Lamar Sprinkles, CMA   Authorized by:   Jacques Navy MD   Signed by:   Lamar Sprinkles, CMA on 05/21/2010   Method used:   Telephoned to ...       [facility] [address] (retail)       [address]       , [place]  [address]       Ph: [phone]       Fax: [phone]   RxID:   [id number]

## 2010-08-05 ENCOUNTER — Other Ambulatory Visit: Payer: Self-pay | Admitting: Internal Medicine

## 2010-08-10 ENCOUNTER — Other Ambulatory Visit: Payer: Self-pay | Admitting: Internal Medicine

## 2010-08-24 ENCOUNTER — Telehealth: Payer: Self-pay | Admitting: *Deleted

## 2010-08-24 NOTE — Telephone Encounter (Signed)
Patient requesting refill of imitrex 100mg . It has been many years since pt req rx, please advise

## 2010-08-26 MED ORDER — SUMATRIPTAN SUCCINATE 100 MG PO TABS: 100.0000 mg | ORAL_TABLET | Freq: Once | ORAL | Status: DC | PRN

## 2010-08-26 NOTE — Telephone Encounter (Signed)
1. Ok for imitrex 100mg  one prn migraine. #6 refill x 3 2. Needs to state if she is having recurrent migraines. May need OV- we can offer.

## 2010-08-27 NOTE — Telephone Encounter (Signed)
Pt aware of rx. She will call office if they are recurrent.

## 2010-09-12 ENCOUNTER — Other Ambulatory Visit (INDEPENDENT_AMBULATORY_CARE_PROVIDER_SITE_OTHER): Payer: Managed Care, Other (non HMO) | Admitting: Internal Medicine

## 2010-09-12 ENCOUNTER — Other Ambulatory Visit (INDEPENDENT_AMBULATORY_CARE_PROVIDER_SITE_OTHER): Payer: Managed Care, Other (non HMO)

## 2010-09-12 ENCOUNTER — Other Ambulatory Visit: Payer: Self-pay | Admitting: Internal Medicine

## 2010-09-12 DIAGNOSIS — Z0000 Encounter for general adult medical examination without abnormal findings: Secondary | ICD-10-CM

## 2010-09-12 DIAGNOSIS — Z13220 Encounter for screening for lipoid disorders: Secondary | ICD-10-CM

## 2010-09-12 LAB — CBC WITH DIFFERENTIAL/PLATELET
Basophils Absolute: 0 10*3/uL (ref 0.0–0.1)
Basophils Relative: 0.3 % (ref 0.0–3.0)
Eosinophils Absolute: 0.2 10*3/uL (ref 0.0–0.7)
Eosinophils Relative: 3.8 % (ref 0.0–5.0)
HCT: 37.5 % (ref 36.0–46.0)
Hemoglobin: 12 g/dL (ref 12.0–15.0)
Lymphocytes Relative: 36.6 % (ref 12.0–46.0)
Lymphs Abs: 1.7 10*3/uL (ref 0.7–4.0)
MCHC: 32 g/dL (ref 30.0–36.0)
MCV: 72.7 fl — ABNORMAL LOW (ref 78.0–100.0)
Monocytes Absolute: 0.5 10*3/uL (ref 0.1–1.0)
Monocytes Relative: 11.2 % (ref 3.0–12.0)
Neutro Abs: 2.3 10*3/uL (ref 1.4–7.7)
Neutrophils Relative %: 48.1 % (ref 43.0–77.0)
Platelets: 220 10*3/uL (ref 150.0–400.0)
RBC: 5.16 Mil/uL — ABNORMAL HIGH (ref 3.87–5.11)
RDW: 15.9 % — ABNORMAL HIGH (ref 11.5–14.6)
WBC: 4.8 10*3/uL (ref 4.5–10.5)

## 2010-09-12 LAB — BASIC METABOLIC PANEL
BUN: 17 mg/dL (ref 6–23)
CO2: 27 mEq/L (ref 19–32)
Calcium: 9.3 mg/dL (ref 8.4–10.5)
Chloride: 105 mEq/L (ref 96–112)
Creatinine, Ser: 1 mg/dL (ref 0.4–1.2)
GFR: 73.31 mL/min (ref >60.00)
Glucose, Bld: 101 mg/dL — ABNORMAL HIGH (ref 70–99)
Potassium: 3.8 mEq/L (ref 3.5–5.1)
Sodium: 140 mEq/L (ref 135–145)

## 2010-09-12 LAB — URINALYSIS, ROUTINE W REFLEX MICROSCOPIC
Bilirubin Urine: NEGATIVE
Hgb urine dipstick: NEGATIVE
Ketones, ur: NEGATIVE
Nitrite: NEGATIVE
Specific Gravity, Urine: 1.02 (ref 1.000–1.030)
Total Protein, Urine: NEGATIVE
Urine Glucose: NEGATIVE
Urobilinogen, UA: 0.2 (ref 0.0–1.0)
pH: 6 (ref 5.0–8.0)

## 2010-09-12 LAB — TSH: TSH: 2.27 u[IU]/mL (ref 0.35–5.50)

## 2010-09-12 LAB — LIPID PANEL
Cholesterol: 213 mg/dL — ABNORMAL HIGH (ref 0–200)
HDL: 49.4 mg/dL (ref >39.00)
Total CHOL/HDL Ratio: 4
Triglycerides: 83 mg/dL (ref 0.0–149.0)
VLDL: 16.6 mg/dL (ref 0.0–40.0)

## 2010-09-12 LAB — LDL CHOLESTEROL, DIRECT: Direct LDL: 147 mg/dL

## 2010-09-12 LAB — HEPATIC FUNCTION PANEL
ALT: 21 U/L (ref 0–35)
AST: 25 U/L (ref 0–37)
Albumin: 3.6 g/dL (ref 3.5–5.2)
Alkaline Phosphatase: 69 U/L (ref 39–117)
Bilirubin, Direct: 0 mg/dL (ref 0.0–0.3)
Total Bilirubin: 0.4 mg/dL (ref 0.3–1.2)
Total Protein: 7.2 g/dL (ref 6.0–8.3)

## 2010-09-14 ENCOUNTER — Encounter: Payer: Self-pay | Admitting: Internal Medicine

## 2010-09-17 ENCOUNTER — Ambulatory Visit (INDEPENDENT_AMBULATORY_CARE_PROVIDER_SITE_OTHER): Payer: Managed Care, Other (non HMO) | Admitting: Internal Medicine

## 2010-09-17 ENCOUNTER — Encounter: Payer: Self-pay | Admitting: Internal Medicine

## 2010-09-17 VITALS — BP 156/100 | HR 69 | Temp 98.2°F | Wt 197.0 lb

## 2010-09-17 DIAGNOSIS — Z136 Encounter for screening for cardiovascular disorders: Secondary | ICD-10-CM

## 2010-09-17 DIAGNOSIS — E785 Hyperlipidemia, unspecified: Secondary | ICD-10-CM

## 2010-09-17 DIAGNOSIS — I10 Essential (primary) hypertension: Secondary | ICD-10-CM

## 2010-09-17 DIAGNOSIS — J329 Chronic sinusitis, unspecified: Secondary | ICD-10-CM

## 2010-09-17 MED ORDER — AMOXICILLIN-POT CLAVULANATE ER 1000-62.5 MG PO TB12: 1.0000 | ORAL_TABLET | Freq: Two times a day (BID) | ORAL | Status: AC

## 2010-09-17 MED ORDER — OMEPRAZOLE 40 MG PO CPDR: 40.0000 mg | DELAYED_RELEASE_CAPSULE | Freq: Every day | ORAL | Status: DC

## 2010-09-17 MED ORDER — AMOXICILLIN 875 MG PO TABS: 875.0000 mg | ORAL_TABLET | Freq: Two times a day (BID) | ORAL | Status: DC

## 2010-09-17 NOTE — Patient Instructions (Addendum)
Mild URI - a little redness to TMs and ear canal left. Plan - low dose decongestant - sudafed 30mg  two or three times a day. Continue all your allergy and respiratory medications.  Cholesterol - a mild elevation in LDL at 147 with a goal of 161 or less. Plan - no medication. Recommend regular exercise and a low fat diet.  Chest-pain - reviewed record: had a stress myoview '06 that was normal with an EF of 70%  Psycho-social stress - "what is...is!! Good idea to see Dr. Dellia Cloud.

## 2010-09-17 NOTE — Progress Notes (Signed)
Subjective:    Patient ID: Dominique Johnston, female    DOB: 04-05-1946, 65 y.o.   MRN: [id number]  HPI Dominique Johnston presents for a routine exam. She has been medically stable. Her allergies have continued to be a problem and she has followed with [facility] on a regular basis. She has seen her gynecologist for pelvic/pap and breast exam. She has no acute medical complaints at today's visit. She has been under a lot of social strain with her daughter, who lives with her,  having a baby and that she is very involved in the care of this infant.   Past Medical History  Diagnosis Date  . Allergy     rhinitis  . Headache   . Hypertension   . Asthma   . Bronchitis     recurrent  . Depression   . DJD (degenerative joint disease) of knee   . DJD (degenerative joint disease), lumbosacral   . Bouchard nodes (DJD hand)   . IBS (irritable bowel syndrome)   . Headache   . Hyperlipidemia   . GERD (gastroesophageal reflux disease)   . Sinusitis, chronic    Past Surgical History  Procedure Date  . Tubal ligation   . Breast lumpectomy   . Tonsillectomy   . Septoplasty   . Transnasal laryngoscopy   . Orif ankle fracture '07    Left ankle   Family History  Problem Relation Age of Onset  . Hypertension Father   . Coronary artery disease Father     MI/ Fatal  . Cancer Neg Hx     breast or colon  . Diabetes Neg Hx    History   Social History  . Marital Status: Divorced    Spouse Name: N/A    Number of Children: N/A  . Years of Education: 17   Occupational History  .  [employer]    retired   Social History Main Topics  . Smoking status: Never Smoker   . Smokeless tobacco: Never Used  . Alcohol Use: No  . Drug Use: No  . Sexually Active: Not Currently   Other Topics Concern  . Not on file   Social History Narrative   Dominique Johnston -BS, has had additional course work.  married '71- after 29 years husband left, divorce final in '06.  1 son- '74, 1 daughter '76;  2 grandchildren. 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 Lives in her own home, daughter and grandchild home with her.        Review of Systems Review of Systems  Constitutional:  Negative for fever, chills, activity change and unexpected weight change.  HENT:  Negative for hearing loss, ear pain, congestion, neck stiffness and postnasal drip.   Eyes: Negative for pain, discharge and visual disturbance.  Respiratory: Negative for chest tightness and wheezing.   Cardiovascular: Negative for chest pain and palpitations.       [No decreased exercise tolerance Gastrointestinal: [No change in bowel habit. No bloating or gas. No reflux or indigestion Genitourinary: Negative for urgency, frequency, flank pain and difficulty urinating.  Musculoskeletal: Negative for myalgias, back pain, arthralgias and gait problem.  Neurological: Negative for dizziness, tremors, weakness and headaches.  Hematological: Negative for adenopathy.  Psychiatric/Behavioral: Negative for behavioral problems and dysphoric mood.       Objective:   Physical Exam Overweight AA woman in no distress HEENT - EACs/TMs erythema left EAC with erythema of TMs,  oropharynx with good dentition and no oral lesions. C&S clear, pupils equal, fundiscopic exam normal Chest - no deformity Lungs - Clear to A&P Cor- RRR w/o murmur, rub or gallop Breast exam deferred to gyn Abdomen - BS+, no guarding or rebound Pelvic - deferred to gyn Extremities- MAE, no erythema or synovial thickening around small joints, nl ROM medium and large joints. Neuor - A&O x 3, CN II-XII grossly intact, MS normal, cerebellar - nl gait and station Skin - no suspicious lesions.  Lab Results  Component Value Date   WBC 4.8 09/12/2010   HGB 12.0 09/12/2010   HCT 37.5 09/12/2010   PLT 220.0 09/12/2010   CHOL 213* 09/12/2010   TRIG 83.0 09/12/2010   HDL 49.40 09/12/2010   LDLDIRECT 147.0 09/12/2010   ALT 21 09/12/2010    AST 25 09/12/2010   NA 140 09/12/2010   K 3.8 09/12/2010   CL 105 09/12/2010   CREATININE 1.0 09/12/2010   BUN 17 09/12/2010   CO2 27 09/12/2010   TSH 2.27 09/12/2010          Assessment & Plan:  1. Hyperlipidemia - LDL is mildly elevated. Not at treatment threshold per NCEP ATP III guidelines.  Plan - life-style management with low fat diet, increased effort to exercise.  2. Hypertension - poor control at today's visit, usually better based on chart review.  Plan - continue present medications - needs recheck of BP at home or in the office over the next two weeks. Will need medication modification if BP remains elevated.  3. Allergies - currently stable on a complex medical regimen  4. Psycho-social - a difficult situation with a new grandchild in the home. There is no acute decompensation or need to change medication.  5. Degenerative joint disease - multiple sites: stable.   6. Acute on chronic sinusitis - patient c/o flare of symptoms with mild erythema of TM's  Plan - augmentin XR bid for full course of treatment  7. Health Maintenance - interval history is stable. Physical exam is normal with mild URI. Lab results, other than LDL elevation, are in normal limits. She is current with her gynecologist. Last colonoscopy '04. Immunizations: Tetnus '10; Shingles - '11. She is a candidate for pneumonia vaccine. 12 Lead EKG with no signs of ischemia or injury.   In summary - a pleasant woman with stable medical problems who is current with health maintenance. She will return prn or 1 year.

## 2010-09-18 ENCOUNTER — Encounter: Payer: Self-pay | Admitting: Internal Medicine

## 2010-09-20 ENCOUNTER — Ambulatory Visit (INDEPENDENT_AMBULATORY_CARE_PROVIDER_SITE_OTHER): Payer: 59 | Admitting: Psychology

## 2010-09-20 DIAGNOSIS — F4321 Adjustment disorder with depressed mood: Secondary | ICD-10-CM

## 2010-09-28 NOTE — Assessment & Plan Note (Signed)
[facility]                           PRIMARY CARE OFFICE NOTE   NAME:Dominique Johnston, Dominique Johnston                    MRN:          [id number]  DATE:05/19/2006                            DOB:          1946/03/27    Dominique Johnston is a 65 year old African-American woman, followed for  multiple medical problems, who presents for a general examination at  today's visit.   Patient has a long-standing history of severe allergies with frequent  URIs.  Today she is suffering from sinus trouble secondary to weather  change.  She has had no fevers, sweats or chills, but she does have  significant sinus congestion, postnasal drainage, facial pain, and  discomfort and weakness.   PAST MEDICAL HISTORY/FAMILY HISTORY/ SOCIAL HISTORY:  Well documented in  my note of May 17, 2005 with no significant change.   In the interval since her last full exam, patient has been seen for mole  removal, which was a benign mole.  She has been seen for seasonal  allergies.  Patient has had multiple visits to Dr. Sidney Ace, last  April 07, 2006.  Please see that correspondence.  Patient also had  been seen at [facility], Department of  Otolaryngology, because of her sinus problems.  She did undergo  transnasal fiberoptic laryngoscopy with dysphonia.  It was felt the  patient had significant reflux and irritation, and her proton pump  inhibitor was doubled to Nexium 40 mg b.i.d.   Patient has seen her gynecologist, Dr. Meredeth Ide, most recently  October of 2007 with normal examination.  Last mammogram was August 23, 2005, and was unremarkable.   Patient's last colonoscopy was January of 2004 and was unremarkable,  with the patient scheduled for followup in 10 years.  Her last tetanus  shot was greater than 10 years ago.   PHYSICIAN ROSTER:  As above.   CURRENT MEDICATIONS:  Diovan 320 mg daily, B12 multivitamin, vitamin C,  and iron daily,  potassium 8 mEq daily, Astelin nasal spray daily, Zyrtec  10 mg daily, Accolate 20 mg daily, Nexium 40 mg b.i.d., furosemide 40 mg  daily, Zoloft 150 mg daily, Nasarel spray generic daily, Aerohist  tablets b.i.d. p.r.n., Motrin 800 mg q.a. p.r.n., Xanax 0.25 mg q.6  p.r.n., Librax for IBS p.r.n., Imitrex 100 mg p.r.n. migraine, femhrt 5  mg daily, albuterol HFA p.r.n., Symbicort unspecified dose daily.   REVIEW OF SYSTEMS:  Patient has had no fevers.  She does have sinus  congestion and discomfort.  No ophthalmology or ENT complaints.  Cardiovascular is stable.  Patient with cough, allergy symptoms as  noted.  No GI, GU, musculoskeletal, or dermatologic complaints.   PHYSICAL EXAMINATION:  Temperature is 98.2, blood pressure 148/86, pulse  75, weight 212.  GENERAL APPEARANCE:  This is an overweight, African-American woman in no  acute distress.  HEENT:  EACs and TMs appeared normal.  Patient had tenderness over her  frontal and maxillary sinuses.  Conjunctivae and sclerae were clear.  NECK:  Supple without thyromegaly.  No lymphadenopathy was noted in the  cervical supraclavicular regions.  CHEST:  No CVA tenderness.  LUNGS:  Clear to auscultation and percussion.  BREAST EXAM:  Deferred to gynecology.  CARDIOVASCULAR:  2+ radial pulse, no JVD or carotid bruits.  She had a  quiet precordium with a regular rate and rhythm without murmurs, rubs,  or gallops.  ABDOMEN:  Soft, no guarding or rebound.  No organosplenomegaly was  noted.  PELVIC AND RECTAL EXAMS:  Deferred to gynecology.  EXTREMITIES:  Without cyanosis, clubbing, or edema or deformity.  NEUROLOGIC EXAM:  Grossly nonfocal.   LABORATORY DATA:  Hemoglobin was 12 gm, white count 4,400 with a normal  differential.  Cholesterol 181, triglycerides 55, HDL was 46, LDL 124.  Chemistries were normal with a glucose of 96, creatinine of 1.2, GFR of  59.  TSH normal at 2.36, urinalysis was negative.   ASSESSMENT/PLAN:  1. Allergy.   Patient with severe, chronic allergies followed by Dr.      [place] Callas.  Because of her symptoms today with expiratory wheezing and      cough, she is given a handheld nebulizer treatment with albuterol      2.5 mg.  She is given Depo-Medrol 120 mg IM.  She is instructed to      call Dr. Lyla Son office for an appointment Wednesday for followup.      No indication for antibiotics at this time.  2. Hypertension.  Patient's blood pressure mildly elevated at today's      visit, but within her normal baseline.  Will make no changes in her      medications at this time.  3. Gastrointestinal.  Patient with irritable bowel syndrome, currently      stable.  4. Migraine, stable.  5. Degenerative joint disease.  Patient has no active complaints at      this time and is stable on her Motrin.  6. Health maintenance.  Patient is current and up-to-date as noted      above.   SUMMARY:  A very pleasant woman, primary problem being sinus allergy.  She has asked to return to see me on a p.r.n. basis.     Rosalyn Gess Norins, MD  Electronically Signed    MEN/MedQ  DD: 05/20/2006  DT: 05/20/2006  Job #: [id number]   cc:   Ms. Mindi Curling P. Romine, M.D.  Sidney Ace, M.D. [facility]

## 2010-10-05 ENCOUNTER — Ambulatory Visit (INDEPENDENT_AMBULATORY_CARE_PROVIDER_SITE_OTHER): Payer: 59 | Admitting: Psychology

## 2010-10-05 DIAGNOSIS — F331 Major depressive disorder, recurrent, moderate: Secondary | ICD-10-CM

## 2010-10-19 ENCOUNTER — Ambulatory Visit (INDEPENDENT_AMBULATORY_CARE_PROVIDER_SITE_OTHER): Payer: 59 | Admitting: Psychology

## 2010-10-19 DIAGNOSIS — F4321 Adjustment disorder with depressed mood: Secondary | ICD-10-CM

## 2010-10-22 ENCOUNTER — Telehealth: Payer: Self-pay | Admitting: *Deleted

## 2010-10-22 MED ORDER — OMEPRAZOLE 40 MG PO CPDR: 40.0000 mg | DELAYED_RELEASE_CAPSULE | Freq: Every day | ORAL | Status: DC

## 2010-10-22 NOTE — Telephone Encounter (Signed)
Omeprazole needs PA per pt. Spoke w/pharm and pt. Needs PA on omeprazole. Explained process to pt. She has tried nexium, zantac, tagament and pepcid. Will proceed with PA.  PA INFO: [phone]  ID# [phone]

## 2010-10-22 NOTE — Telephone Encounter (Signed)
PA approved x 1 year. Pt and pharm aware.

## 2010-10-28 ENCOUNTER — Other Ambulatory Visit: Payer: Self-pay | Admitting: Internal Medicine

## 2010-11-02 ENCOUNTER — Ambulatory Visit (INDEPENDENT_AMBULATORY_CARE_PROVIDER_SITE_OTHER): Payer: 59 | Admitting: Psychology

## 2010-11-02 DIAGNOSIS — F4321 Adjustment disorder with depressed mood: Secondary | ICD-10-CM

## 2010-11-03 ENCOUNTER — Other Ambulatory Visit: Payer: Self-pay | Admitting: Internal Medicine

## 2010-11-05 ENCOUNTER — Other Ambulatory Visit: Payer: Self-pay | Admitting: Internal Medicine

## 2010-11-05 DIAGNOSIS — Z1231 Encounter for screening mammogram for malignant neoplasm of breast: Secondary | ICD-10-CM

## 2010-11-05 NOTE — Telephone Encounter (Signed)
Rx Done . 

## 2010-12-03 ENCOUNTER — Ambulatory Visit
Admission: RE | Admit: 2010-12-03 | Discharge: 2010-12-03 | Disposition: A | Discharge status: Home / Self Care | Payer: Managed Care, Other (non HMO) | Source: Ambulatory Visit | Attending: Internal Medicine | Admitting: Internal Medicine

## 2010-12-03 DIAGNOSIS — Z1231 Encounter for screening mammogram for malignant neoplasm of breast: Secondary | ICD-10-CM

## 2010-12-07 ENCOUNTER — Telehealth: Payer: Self-pay | Admitting: *Deleted

## 2010-12-07 NOTE — Telephone Encounter (Signed)
Ok for refill x 5 

## 2010-12-07 NOTE — Telephone Encounter (Signed)
Alprazolam 0.5 mg [last refill 02/09/10 #90x5]

## 2010-12-10 MED ORDER — ALPRAZOLAM 0.5 MG PO TABS: 0.5000 mg | ORAL_TABLET | Freq: Three times a day (TID) | ORAL | Status: DC | PRN

## 2010-12-10 NOTE — Telephone Encounter (Signed)
Called in.

## 2011-01-02 ENCOUNTER — Other Ambulatory Visit: Payer: Self-pay | Admitting: Internal Medicine

## 2011-01-04 ENCOUNTER — Other Ambulatory Visit: Payer: Self-pay | Admitting: Internal Medicine

## 2011-02-01 ENCOUNTER — Other Ambulatory Visit: Payer: Self-pay | Admitting: Internal Medicine

## 2011-02-09 ENCOUNTER — Other Ambulatory Visit: Payer: Self-pay | Admitting: Internal Medicine

## 2011-02-11 NOTE — Telephone Encounter (Signed)
Please Advise refill 

## 2011-02-11 NOTE — Telephone Encounter (Signed)
OK for #90 with prn refills. Sig - 1 q8 prn

## 2011-04-05 ENCOUNTER — Ambulatory Visit (INDEPENDENT_AMBULATORY_CARE_PROVIDER_SITE_OTHER): Payer: Medicare Other

## 2011-04-05 DIAGNOSIS — Z23 Encounter for immunization: Secondary | ICD-10-CM

## 2011-04-18 ENCOUNTER — Telehealth: Payer: Self-pay | Admitting: *Deleted

## 2011-04-18 NOTE — Telephone Encounter (Signed)
Pt states she sent email to you regarding her IBS medication--that Medicare will not cover Rx.

## 2011-04-25 ENCOUNTER — Other Ambulatory Visit: Payer: Self-pay | Admitting: Internal Medicine

## 2011-04-25 NOTE — Telephone Encounter (Signed)
Pt needs an alternative to Librax, her insurance will no longer pay for this. Please Advise

## 2011-04-26 ENCOUNTER — Other Ambulatory Visit: Payer: Self-pay | Admitting: Internal Medicine

## 2011-04-26 NOTE — Telephone Encounter (Signed)
No covered because it is a benzo! Call in bentyl 10 mg 1 pot tid prn IBS symptoms. #90, refill x 5

## 2011-04-29 MED ORDER — DICYCLOMINE HCL 10 MG PO CAPS: 10.0000 mg | ORAL_CAPSULE | Freq: Three times a day (TID) | ORAL | Status: DC | PRN

## 2011-04-29 NOTE — Telephone Encounter (Signed)
Done. Pt informed.

## 2011-05-25 ENCOUNTER — Other Ambulatory Visit: Payer: Self-pay | Admitting: Internal Medicine

## 2011-06-18 ENCOUNTER — Other Ambulatory Visit: Payer: Self-pay | Admitting: *Deleted

## 2011-06-18 NOTE — Telephone Encounter (Signed)
Received fax pt requesting renewal on alprazolam. Last filled 05/25/11. Is this ok?

## 2011-06-19 MED ORDER — ALPRAZOLAM 0.5 MG PO TABS: 0.5000 mg | ORAL_TABLET | Freq: Three times a day (TID) | ORAL | Status: DC | PRN

## 2011-06-19 NOTE — Telephone Encounter (Signed)
Called refill into [facility] spoke with Kim/pharmacist. EPIC updated... 06/19/11@9 :31am/LMB

## 2011-06-20 ENCOUNTER — Other Ambulatory Visit: Payer: Self-pay | Admitting: Internal Medicine

## 2011-07-24 ENCOUNTER — Other Ambulatory Visit: Payer: Self-pay | Admitting: Internal Medicine

## 2011-07-25 ENCOUNTER — Other Ambulatory Visit: Payer: Self-pay | Admitting: Internal Medicine

## 2011-08-30 ENCOUNTER — Other Ambulatory Visit: Payer: Self-pay | Admitting: Internal Medicine

## 2011-09-16 ENCOUNTER — Other Ambulatory Visit: Payer: Self-pay | Admitting: Internal Medicine

## 2011-10-22 ENCOUNTER — Encounter: Payer: Self-pay | Admitting: Internal Medicine

## 2011-10-22 ENCOUNTER — Other Ambulatory Visit (INDEPENDENT_AMBULATORY_CARE_PROVIDER_SITE_OTHER): Payer: Medicare Other

## 2011-10-22 ENCOUNTER — Ambulatory Visit (INDEPENDENT_AMBULATORY_CARE_PROVIDER_SITE_OTHER): Payer: Medicare Other | Admitting: Internal Medicine

## 2011-10-22 VITALS — BP 162/100 | HR 72 | Temp 98.2°F | Resp 16 | Ht 66.0 in | Wt 198.0 lb

## 2011-10-22 DIAGNOSIS — I10 Essential (primary) hypertension: Secondary | ICD-10-CM

## 2011-10-22 DIAGNOSIS — E785 Hyperlipidemia, unspecified: Secondary | ICD-10-CM

## 2011-10-22 LAB — COMPREHENSIVE METABOLIC PANEL
ALT: 15 U/L (ref 0–35)
AST: 22 U/L (ref 0–37)
Albumin: 3.9 g/dL (ref 3.5–5.2)
Alkaline Phosphatase: 79 U/L (ref 39–117)
BUN: 13 mg/dL (ref 6–23)
CO2: 27 mEq/L (ref 19–32)
Calcium: 8.9 mg/dL (ref 8.4–10.5)
Chloride: 105 mEq/L (ref 96–112)
Creatinine, Ser: 1 mg/dL (ref 0.4–1.2)
GFR: 71.38 mL/min (ref >60.00)
Glucose, Bld: 87 mg/dL (ref 70–99)
Potassium: 3.6 mEq/L (ref 3.5–5.1)
Sodium: 141 mEq/L (ref 135–145)
Total Bilirubin: 0.5 mg/dL (ref 0.3–1.2)
Total Protein: 7.3 g/dL (ref 6.0–8.3)

## 2011-10-22 LAB — LIPID PANEL
Cholesterol: 202 mg/dL — ABNORMAL HIGH (ref 0–200)
HDL: 54.3 mg/dL (ref >39.00)
Total CHOL/HDL Ratio: 4
Triglycerides: 92 mg/dL (ref 0.0–149.0)
VLDL: 18.4 mg/dL (ref 0.0–40.0)

## 2011-10-22 LAB — LDL CHOLESTEROL, DIRECT: Direct LDL: 137 mg/dL

## 2011-10-22 MED ORDER — DILTIAZEM HCL ER 120 MG PO CP24: 120.0000 mg | ORAL_CAPSULE | Freq: Every day | ORAL | Status: DC

## 2011-10-22 NOTE — Patient Instructions (Signed)
NOrmal exam - no evidence of ear infection.  Blood pressure is poorly controlled. Plan - continue your present medication. Add Diltia XT 120 mg once a day. Check BP at home or drugstore and call back if SBP not running less than 140 daily.  Labs - had thyroid lab last year and it is unlikely to change in the absence of physical findings of thyroid disease.  Lab - will check metabolic panel, cholesterol since it was a little high last year.

## 2011-10-22 NOTE — Progress Notes (Signed)
  Subjective:    Patient ID: Dominique Johnston, female    DOB: [birth year], 66 y.o.   MRN: [id number]  HPI [name] is here for a welcome to  Medicare wellness examination and management of other chronic and acute problems.  She complains for fullness in the right ear. She also has increased fatigue and malaise.   The risk factors are reflected in the social history.  The roster of all physicians providing medical care to patient - is listed in the Snapshot section of the chart.  Activities of daily living:  The patient is 100% inedpendent in all ADLs: dressing, toileting, feeding as well as independent mobility  Home safety : The patient has smoke detectors in the home. She reports the home is "fall - safe." They wear seatbelts.No firearms at home ( firearms are present in the home, kept in a safe fashion). There is no violence in the home.   There is no risks for hepatitis, STDs or HIV. There is no   history of blood transfusion. They have no travel history to infectious disease endemic areas of the world.  The patient has (has not) seen their dentist in the last six month. They have (not) seen their eye doctor in the last year. They deny (admit to) any hearing difficulty and have not had audiologic testing in the last year.  They do not  have excessive sun exposure. Discussed the need for sun protection: hats, long sleeves and use of sunscreen if there is significant sun exposure.   Diet: the importance of a healthy diet is discussed. They do have a healthy (unhealthy-high fat/fast food) diet.  The patient has a regular exercise program: _______ , ____duration, _____per week.  The benefits of regular aerobic exercise were discussed.  Depression screen: there are no signs or vegative symptoms of depression- irritability, change in appetite, anhedonia, sadness/tearfullness.  Cognitive assessment: the patient manages all their financial and personal affairs and is actively engaged. They  could relate day,date,year and events; recalled 3/3 objects at 3 minutes; performed clock-face test normally.  The following portions of the patient's history were reviewed and updated as appropriate: allergies, current medications, past family history, past medical history,  past surgical history, past social history  and problem list.  Vision, hearing, body mass index were assessed and reviewed.   During the course of the visit the patient was educated and counseled about appropriate screening and preventive services including : fall prevention , diabetes screening, nutrition counseling, colorectal cancer screening, and recommended immunizations.    Review of Systems     Objective:   Physical Exam        Assessment & Plan:

## 2011-10-23 ENCOUNTER — Other Ambulatory Visit: Payer: Self-pay | Admitting: Internal Medicine

## 2011-10-23 NOTE — Telephone Encounter (Signed)
Refill Rx sent to [facility]. Rx lasix.

## 2011-11-04 ENCOUNTER — Other Ambulatory Visit: Payer: Self-pay | Admitting: Internal Medicine

## 2011-11-04 NOTE — Telephone Encounter (Signed)
Zoloft request [last refill 03.13.13 #[id number]]/SLS Please advise.

## 2011-11-13 ENCOUNTER — Telehealth: Payer: Self-pay | Admitting: *Deleted

## 2011-11-13 NOTE — Telephone Encounter (Signed)
Pt had CPX a couple of weeks ago (6/11) and is requesting results of lab work.

## 2011-12-04 ENCOUNTER — Other Ambulatory Visit: Payer: Self-pay | Admitting: Internal Medicine

## 2011-12-04 DIAGNOSIS — Z1231 Encounter for screening mammogram for malignant neoplasm of breast: Secondary | ICD-10-CM

## 2011-12-13 ENCOUNTER — Other Ambulatory Visit: Payer: Self-pay | Admitting: *Deleted

## 2011-12-13 NOTE — Telephone Encounter (Signed)
OK to fill this prescription with additional refills x0 Thank you!  

## 2011-12-13 NOTE — Telephone Encounter (Signed)
Patient request refill on alprazolam. Dr, Debby Bud patient.

## 2011-12-16 NOTE — Telephone Encounter (Signed)
Refill on [facility]raz[facility]am called [facility].

## 2011-12-21 ENCOUNTER — Emergency Department (HOSPITAL_COMMUNITY)
Admission: EM | Admit: 2011-12-21 | Discharge: 2011-12-21 | Disposition: A | Discharge status: Home / Self Care | Payer: Medicare Other | Source: Home / Self Care | Attending: Emergency Medicine | Admitting: Emergency Medicine

## 2011-12-21 ENCOUNTER — Encounter (HOSPITAL_COMMUNITY): Payer: Self-pay | Admitting: *Deleted

## 2011-12-21 DIAGNOSIS — J329 Chronic sinusitis, unspecified: Secondary | ICD-10-CM

## 2011-12-21 DIAGNOSIS — J40 Bronchitis, not specified as acute or chronic: Secondary | ICD-10-CM

## 2011-12-21 MED ORDER — ALBUTEROL SULFATE (5 MG/ML) 0.5% IN NEBU
INHALATION_SOLUTION | RESPIRATORY_TRACT | Status: AC
  Filled 2011-12-21: qty 1

## 2011-12-21 MED ORDER — GUAIFENESIN ER 600 MG PO TB12: 1200.0000 mg | ORAL_TABLET | Freq: Two times a day (BID) | ORAL | Status: DC

## 2011-12-21 MED ORDER — HYDROCODONE-ACETAMINOPHEN 7.5-500 MG/15ML PO SOLN: 5.0000 mL | Freq: Four times a day (QID) | ORAL | Status: AC | PRN

## 2011-12-21 MED ORDER — IPRATROPIUM BROMIDE 0.02 % IN SOLN
0.5000 mg | Freq: Once | RESPIRATORY_TRACT | Status: AC
  Administered 2011-12-21: 0.5 mg via RESPIRATORY_TRACT

## 2011-12-21 MED ORDER — DEXAMETHASONE 4 MG PO TABS: ORAL_TABLET | ORAL | Status: AC

## 2011-12-21 MED ORDER — ALBUTEROL SULFATE (5 MG/ML) 0.5% IN NEBU
5.0000 mg | INHALATION_SOLUTION | Freq: Once | RESPIRATORY_TRACT | Status: AC
  Administered 2011-12-21: 5 mg via RESPIRATORY_TRACT

## 2011-12-21 NOTE — ED Notes (Signed)
Pt with onset of cough Wednesday night - sinus congestion Thursday - facial tenderness - coughing more at night - history of sinus infections - chest feels tight - sore with cough

## 2011-12-21 NOTE — ED Notes (Signed)
Lungs clear pt feels better post breathing treatment

## 2011-12-21 NOTE — ED Provider Notes (Signed)
History     CSN: [id number]  Arrival date & time 12/21/11  1803   First MD Initiated Contact with Patient 12/21/11 1809      Chief Complaint  Patient presents with  . Cough  . Nasal Congestion  . Fever  . Facial Pain    (Consider location/radiation/quality/duration/timing/severity/associated sxs/prior treatment) HPI Comments: Pt with rhinorrhea, postnasal drip, ST, cough productive of yellowish sputum,  sinus pain/pressure nasal congestion x 2 days.  Has her chest feels "tight" and she feels as if her  "asthma is acting up". Unable to sleep at night secondary to coughing. No ear pain,  wheeze, SOB, abd pain, rash, N/V. Slightly decreased appetite but is tolerating po.  States that she's been taking Mucinex with some improvement in the congestion. She has a Neti pot, nasal steroids, but states that she's not tried these for her symptoms. States that she has an albuterol inhaler at home but has not been using this, and that she is compliant with her symbicort. Has history of asthma, seasonal allergies, recurrent sinus infections.  ROS as noted in HPI. All other ROS negative.     Patient is a 66 y.o. female presenting with cough and fever. The history is provided by the patient. No language interpreter was used.  Cough This is a new problem. The current episode started 2 days ago. The problem has not changed since onset.The cough is productive of sputum. There has been no fever. Associated symptoms include headaches, rhinorrhea and sore throat. Pertinent negatives include no chest pain, no chills, no ear pain, no myalgias, no shortness of breath and no wheezing. She has tried decongestants for the symptoms. The treatment provided mild relief. She is not a smoker. Her past medical history is significant for asthma.  Fever Primary symptoms of the febrile illness include fever, headaches and cough. Primary symptoms do not include wheezing, shortness of breath or myalgias.    Past Medical  History  Diagnosis Date  . Allergy     rhinitis  . Headache   . Hypertension   . Asthma   . Bronchitis     recurrent  . Depression   . DJD (degenerative joint disease) of knee   . DJD (degenerative joint disease), lumbosacral   . Bouchard nodes (DJD hand)   . IBS (irritable bowel syndrome)   . Headache   . Hyperlipidemia   . GERD (gastroesophageal reflux disease)   . Sinusitis, chronic     Past Surgical History  Procedure Date  . Tubal ligation   . Breast lumpectomy   . Tonsillectomy   . Septoplasty   . Transnasal laryngoscopy   . Orif ankle fracture '07    Left ankle    Family History  Problem Relation Age of Onset  . Hypertension Father   . Coronary artery disease Father     MI/ Fatal  . Cancer Neg Hx     breast or colon  . Diabetes Neg Hx     History  Substance Use Topics  . Smoking status: Never Smoker   . Smokeless tobacco: Never Used  . Alcohol Use: No    OB History    Grav Para Term Preterm Abortions TAB SAB Ect Mult Living                  Review of Systems  Constitutional: Positive for fever. Negative for chills.  HENT: Positive for sore throat and rhinorrhea. Negative for ear pain.   Respiratory: Positive for cough. Negative  for shortness of breath and wheezing.   Cardiovascular: Negative for chest pain.  Musculoskeletal: Negative for myalgias.  Neurological: Positive for headaches.    Allergies  Enalapril maleate and Sulfamethoxazole w-trimethoprim  Home Medications   Current Outpatient Rx  Name Route Sig Dispense Refill  . ALBUTEROL SULFATE HFA 108 (90 BASE) MCG/ACT IN AERS Inhalation Inhale 2 puffs into the lungs every 6 (six) hours as needed.    . ALPRAZOLAM 0.5 MG PO TABS Oral Take 1 tablet (0.5 mg total) by mouth 3 (three) times daily as needed. 90 tablet 5  . VITAMIN C PO Oral Take 1 tablet by mouth daily.      . BUDESONIDE-FORMOTEROL FUMARATE 160-4.5 MCG/ACT IN AERO Inhalation Inhale 2 puffs into the lungs 2 (two) times daily.       Marland Kitchen CLINDINIUM-CHLORDIAZEPOXIDE 2.5-5 MG PO CAPS  TAKE ONE CAPSULE BY MOUTH THREE TIMES DAILY 90 capsule 5  . B-12 PO Oral Take by mouth daily.      Marland Kitchen DILTIAZEM HCL ER 120 MG PO CP24 Oral Take 1 capsule (120 mg total) by mouth daily. 30 capsule 11  . FLUTICASONE FUROATE 27.5 MCG/SPRAY NA SUSP Nasal 1 spray by Nasal route daily.      . FUROSEMIDE 40 MG PO TABS  TAKE ONE TABLET BY MOUTH ONE TIME DAILY 30 tablet 11  . IBUPROFEN 800 MG PO TABS  TAKE ONE TABLET BY MOUTH DAILY AS NEEDED 90 tablet 0  . IRON PO Oral Take by mouth daily.      Marland Kitchen LATANOPROST 0.005 % OP SOLN Both Eyes Place 1 drop into both eyes daily.      Marland Kitchen LOSARTAN POTASSIUM 100 MG PO TABS  TAKE ONE TABLET BY MOUTH ONE TIME DAILY 30 tablet 11  . THERAPEUTIC MULTIVITAMIN PO TABS Oral Take 1 tablet by mouth daily.      Marland Kitchen OMEPRAZOLE 40 MG PO CPDR Oral Take 1 capsule (40 mg total) by mouth daily. 90 capsule 3    PA APPROVED THRU 10/22/11  . POTASSIUM CHLORIDE ER 8 MEQ PO TBCR  TAKE ONE TABLET BY MOUTH ONE TIME DAILY 30 tablet 11  . SERTRALINE HCL 100 MG PO TABS  TAKE ONE AND ONE-HALF TABLETS BY MOUTH DAILY 45 tablet 3  . ZAFIRLUKAST 20 MG PO TABS Oral Take 20 mg by mouth as needed.      Marland Kitchen DEXAMETHASONE 4 MG PO TABS  4 tabs (16 mg) po at once on day one, 4 tabs (16 mg) po at once on day 2 8 tablet 0  . GUAIFENESIN ER 600 MG PO TB12 Oral Take 2 tablets (1,200 mg total) by mouth 2 (two) times daily. 30 tablet 0  . HYDROCODONE-ACETAMINOPHEN 7.5-500 MG/15ML PO SOLN Oral Take 5 mLs by mouth every 6 (six) hours as needed for pain. 120 mL 0  . LEVALBUTEROL HCL 1.25 MG/3ML IN NEBU Nebulization Take 1 ampule by nebulization as needed.      . SUMATRIPTAN SUCCINATE 100 MG PO TABS Oral Take 1 tablet (100 mg total) by mouth once as needed for migraine. 6 tablet 3    BP 157/86  Pulse 68  Temp 99.2 F (37.3 C) (Oral)  Resp 20  SpO2 98%  Physical Exam  Nursing note and vitals reviewed. Constitutional: She appears well-developed and  well-nourished.  HENT:  Head: Normocephalic and atraumatic.  Right Ear: Tympanic membrane and ear canal normal.  Left Ear: Tympanic membrane and ear canal normal.  Nose: Mucosal edema and rhinorrhea present. No  epistaxis. Right sinus exhibits maxillary sinus tenderness and frontal sinus tenderness. Left sinus exhibits maxillary sinus tenderness and frontal sinus tenderness.  Mouth/Throat: Uvula is midline, oropharynx is clear and moist and mucous membranes are normal. Normal dentition. No oropharyngeal exudate or posterior oropharyngeal erythema.  Eyes: Conjunctivae and EOM are normal.  Neck: Normal range of motion. Neck supple.  Cardiovascular: Normal rate, regular rhythm, normal heart sounds and normal pulses.   Pulmonary/Chest: Effort normal. She has wheezes. She has no rhonchi. She has no rales.       occ wheezing  Abdominal: She exhibits no distension.  Musculoskeletal: Normal range of motion.  Lymphadenopathy:    She has no cervical adenopathy.  Neurological: She is alert. Coordination normal.  Skin: Skin is warm and dry. No rash noted.  Psychiatric: She has a normal mood and affect. Her behavior is normal. Judgment and thought content normal.    ED Course  Procedures (including critical care time)  Labs Reviewed - No data to display No results found.   1. Sinusitis   2. Bronchitis      MDM  Patient given DuoNeb, with significant improvement in air movement. States she feels much better. Patient's presentation is most consistent with URI causing a sinusitis and bronchitis. She is afebrile, nontoxic, no history of fevers, and has symptoms for 2 days. Will have her continue Mucinex, saline nasal irrigation, have her continue her nasal steroid for the sinusitis, and start her on  Regular albuterol,  2 days of steroids, cough syrup for bronchitis. No antibiotics at this time, she is not a smoker. Discussed signs and symptoms that should prompt return to the department. Otherwise,  she'll followup with her primary care physician.  Luiz Blare, MD 12/21/11 2120

## 2011-12-31 ENCOUNTER — Ambulatory Visit
Admission: RE | Admit: 2011-12-31 | Discharge: 2011-12-31 | Disposition: A | Discharge status: Home / Self Care | Payer: Medicare Other | Source: Ambulatory Visit | Attending: Internal Medicine | Admitting: Internal Medicine

## 2011-12-31 DIAGNOSIS — Z1231 Encounter for screening mammogram for malignant neoplasm of breast: Secondary | ICD-10-CM

## 2012-01-14 ENCOUNTER — Telehealth: Payer: Self-pay | Admitting: *Deleted

## 2012-01-14 NOTE — Telephone Encounter (Signed)
Rx reqeust for refill on alprazolam 0.5mg . Filled last month with no refills when you were out of office

## 2012-01-15 ENCOUNTER — Other Ambulatory Visit: Payer: Self-pay | Admitting: *Deleted

## 2012-01-15 MED ORDER — ALPRAZOLAM 0.5 MG PO TABS: 0.5000 mg | ORAL_TABLET | Freq: Three times a day (TID) | ORAL | Status: DC | PRN

## 2012-01-15 NOTE — Telephone Encounter (Signed)
Refill phoned into [facility] for alprazolam 0.5mg 

## 2012-01-15 NOTE — Telephone Encounter (Signed)
Ok for refill with 5 add'l

## 2012-01-18 ENCOUNTER — Emergency Department (INDEPENDENT_AMBULATORY_CARE_PROVIDER_SITE_OTHER): Payer: Medicare Other

## 2012-01-18 ENCOUNTER — Encounter (HOSPITAL_COMMUNITY): Payer: Self-pay | Admitting: Emergency Medicine

## 2012-01-18 ENCOUNTER — Emergency Department (HOSPITAL_COMMUNITY)
Admission: EM | Admit: 2012-01-18 | Discharge: 2012-01-18 | Disposition: A | Discharge status: Home / Self Care | Payer: Medicare Other | Source: Home / Self Care | Attending: Emergency Medicine | Admitting: Emergency Medicine

## 2012-01-18 DIAGNOSIS — J309 Allergic rhinitis, unspecified: Secondary | ICD-10-CM

## 2012-01-18 DIAGNOSIS — R05 Cough: Secondary | ICD-10-CM

## 2012-01-18 DIAGNOSIS — K219 Gastro-esophageal reflux disease without esophagitis: Secondary | ICD-10-CM

## 2012-01-18 DIAGNOSIS — J45909 Unspecified asthma, uncomplicated: Secondary | ICD-10-CM

## 2012-01-18 DIAGNOSIS — R059 Cough, unspecified: Secondary | ICD-10-CM

## 2012-01-18 DIAGNOSIS — R0982 Postnasal drip: Secondary | ICD-10-CM

## 2012-01-18 MED ORDER — AMOXICILLIN-POT CLAVULANATE 875-125 MG PO TABS: 1.0000 | ORAL_TABLET | Freq: Two times a day (BID) | ORAL | Status: AC

## 2012-01-18 MED ORDER — HYDROCOD POLST-CHLORPHEN POLST 10-8 MG/5ML PO LQCR: 5.0000 mL | Freq: Two times a day (BID) | ORAL | Status: DC | PRN

## 2012-01-18 NOTE — ED Notes (Signed)
Cough and phlegm.  Reports being seen a month ago for sinusitis and bronchitis, treated with prednisone, albuterol.  Reports she improved, but the cough just never went away.  Reports recently symptoms did worsen.

## 2012-01-18 NOTE — ED Provider Notes (Signed)
Chief Complaint  Patient presents with  . Cough    History of Present Illness:   Dominique Johnston is a 66 year old female who has had a one-month history of a cough productive yellow sputum, wheezing, and shortness of breath. She was seen here on August 10. No x-rays were made at that time. She was given steroids, and Tessalon. She feels a little bit better but has not gotten completely well yet. She also has had some nasal congestion with yellow to clear drainage, sneezing, rhinorrhea, and itchy, watery eyes. She's also had postnasal drainage but no sore throat. She denies any hoarseness or loss of her voice. She has a history of asthma and is on Symbicort an albuterol. She also takes allergy tablet. She has reflux esophagitis and is on omeprazole for that she denies any recent reflux symptoms.  Review of Systems:  Other than noted above, the patient denies any of the following symptoms: Systemic:  No fevers, chills, sweats, weight loss or gain, fatigue, or tiredness. ENT:  No nasal congestion, sneezing, itching, postnasal drip, sinus pressure, headache, sore throat, or hoarseness. Lungs:  No wheezing, shortness of breath, chest tightness or congestion. Heart:  No chest pain, tightness, pressure, PND, orthopnea, or ankle edema. GI:  No indigestion, heartburn, waterbrash, burping, abdominal pain, nausea, or vomiting.  PMFSH:  Past medical history, family history, social history, meds, and allergies were reviewed.  Specifically, there is no history of asthma, allergies, reflux esophagitis or cigarette smoking.   Physical Exam:   Vital signs:  BP 159/90  Pulse 63  Temp 98.1 F (36.7 C) (Oral)  Resp 12  SpO2 99% General:  Alert and oriented.  In no distress.  Skin warm and dry. ENT: TMs and ear canals normal.  Nasal mucosa normal, without drainage.  Pharynx clear without exudate or drainage.  No intraoral lesions. Neck:  No adenopathy, tenderness or mass.  No JVD. Lungs:  No respiratory distress.   Breath sounds clear and equal bilaterally.  No wheezes, rales or rhonchi. Heart:  Regular rhythm, no gallops or murmers.  No pedal edema. Abdomon:  Soft and nontender.  No organomegaly or mass.  Radiology:  Dg Chest 2 View  01/18/2012  *RADIOLOGY REPORT*  Clinical Data: Cough.  CHEST - 2 VIEW  Comparison: Chest x-ray 10/27/2007.  Findings: Lungs appear mildly hyperexpanded with mild flattening of the hemidiaphragms, increased retrosternal air space and mild pruning of the pulmonary vasculature in the periphery, suggesting underlying COPD.  No evidence of pulmonary edema.  No consolidative airspace disease or pleural effusions.  Heart size is borderline enlarged.  Mediastinal contours are unremarkable.  Atherosclerosis in the thoracic aorta.  IMPRESSION: 1.  Changes suggestive of mild COPD redemonstrated, as above, without radiographic evidence of acute cardiopulmonary disease. 2.  Atherosclerosis.   Original Report Authenticated By: Florencia Reasons, M.D.    Assessment:  The primary encounter diagnosis was Cough. Diagnoses of Asthma, GERD (gastroesophageal reflux disease), Allergic rhinitis, and Post-nasal drip were also pertinent to this visit.  She's had a chronic, ongoing cough that is probably multifactorial. I suspect reactive airways disease, post nasal drip which may have components of both allergy and infection, and reflux to be causative factors. She's on many medications for all these things I do not wish to add any other medications. The only additional things I can think of that might be helpful would be a course of antibiotics and a stronger cough medicine. Therefore she was given the medications as outlined below.  Plan:  1.  The following meds were prescribed:   New Prescriptions   AMOXICILLIN-CLAVULANATE (AUGMENTIN) 875-125 MG PER TABLET    Take 1 tablet by mouth 2 (two) times daily.   CHLORPHENIRAMINE-HYDROCODONE (TUSSIONEX) 10-8 MG/5ML LQCR    Take 5 mLs by mouth every 12 (twelve)  hours as needed.   2.  The patient was instructed in symptomatic care and handouts were given. 3.  The patient was told to return if becoming worse in any way, if no better in 3 or 4 days, and given some red flag symptoms that would indicate earlier return.     Reuben Likes, MD 01/18/12 2037

## 2012-02-06 ENCOUNTER — Other Ambulatory Visit: Payer: Self-pay | Admitting: Internal Medicine

## 2012-02-10 ENCOUNTER — Other Ambulatory Visit: Payer: Self-pay | Admitting: Internal Medicine

## 2012-02-11 ENCOUNTER — Other Ambulatory Visit: Payer: Self-pay | Admitting: Internal Medicine

## 2012-04-02 ENCOUNTER — Other Ambulatory Visit: Payer: Self-pay | Admitting: Internal Medicine

## 2012-05-14 ENCOUNTER — Encounter: Payer: Self-pay | Admitting: *Deleted

## 2012-05-20 ENCOUNTER — Encounter: Payer: Self-pay | Admitting: Internal Medicine

## 2012-06-24 ENCOUNTER — Other Ambulatory Visit: Payer: Self-pay | Admitting: Internal Medicine

## 2012-07-31 ENCOUNTER — Telehealth: Payer: Self-pay | Admitting: *Deleted

## 2012-07-31 NOTE — Telephone Encounter (Signed)
Please read note below and advise.  

## 2012-07-31 NOTE — Telephone Encounter (Signed)
Patient Information:  Caller Name: Mairin  Phone: [phone]  Patient: Dominique Johnston, Dominique Johnston  Gender: Female  DOB: Nov 27, 1945  Age: 67 Years  PCP: Illene Regulus (Adults only)  Office Follow Up:  Does the office need to follow up with this patient?: Yes  Instructions For The Office: Pt needs the fax sent today /or needs to come and pick up the note   Symptoms  Reason For Call & Symptoms: Pt states she faxed a message to the office on Wednesday from this pt to request a medical excuse letter to be excused from a 6 week jury duty case. Pt states she has no transportation and she has severe asthma /allergies. She also has osteoarthritis /depression and anxiety and wants to see if MD would write a note that they would fax to [phone]. Attention: Edson Snowball. Pt advised this person of her medical conditions as listed above. She just needs MD to confirm this. She needs this to happen today because the jury duty is on Monday  Reviewed Health History In EMR: Yes  Reviewed Medications In EMR: Yes  Reviewed Allergies In EMR: Yes  Reviewed Surgeries / Procedures: Yes  Date of Onset of Symptoms: 07/31/2012  Guideline(s) Used:  No Protocol Available - Information Only  Disposition Per Guideline:   Discuss with PCP and Callback by Nurse Today  Reason For Disposition Reached:   Nursing judgment  Advice Given:  N/A  Patient Will Follow Care Advice:  YES

## 2012-07-31 NOTE — Telephone Encounter (Signed)
Pt left vm requesting a Jury Duty excuse letter. She states she faxed a request for this on Wednesday. Have you seen this? Please call pt with questions.

## 2012-08-02 ENCOUNTER — Other Ambulatory Visit: Payer: Self-pay | Admitting: Internal Medicine

## 2012-08-02 NOTE — Telephone Encounter (Signed)
Letter generated 3.23.14 to be faxed in the AM 3.24.14.  She did NOT provide the minimum of 48 hrs working days notice!!!

## 2012-08-04 NOTE — Telephone Encounter (Signed)
Letter has been faxed and to be scanned in her chart.

## 2012-08-10 ENCOUNTER — Other Ambulatory Visit: Payer: Self-pay

## 2012-08-10 MED ORDER — ALPRAZOLAM 0.5 MG PO TABS: 0.5000 mg | ORAL_TABLET | Freq: Three times a day (TID) | ORAL | Status: DC | PRN

## 2012-08-11 NOTE — Telephone Encounter (Signed)
Alprazolam called to pharmacy  

## 2012-08-31 ENCOUNTER — Other Ambulatory Visit: Payer: Self-pay | Admitting: Internal Medicine

## 2012-09-27 ENCOUNTER — Other Ambulatory Visit: Payer: Self-pay | Admitting: Internal Medicine

## 2012-09-29 ENCOUNTER — Other Ambulatory Visit: Payer: Self-pay | Admitting: Internal Medicine

## 2012-10-11 ENCOUNTER — Other Ambulatory Visit: Payer: Self-pay | Admitting: Internal Medicine

## 2012-10-22 ENCOUNTER — Other Ambulatory Visit (INDEPENDENT_AMBULATORY_CARE_PROVIDER_SITE_OTHER): Payer: Medicare Other

## 2012-10-22 ENCOUNTER — Encounter: Payer: Self-pay | Admitting: Internal Medicine

## 2012-10-22 ENCOUNTER — Ambulatory Visit (INDEPENDENT_AMBULATORY_CARE_PROVIDER_SITE_OTHER): Payer: Medicare Other | Admitting: Internal Medicine

## 2012-10-22 VITALS — BP 162/94 | HR 62 | Temp 98.2°F | Ht 66.0 in | Wt 209.4 lb

## 2012-10-22 DIAGNOSIS — Z1211 Encounter for screening for malignant neoplasm of colon: Secondary | ICD-10-CM

## 2012-10-22 DIAGNOSIS — Z0000 Encounter for general adult medical examination without abnormal findings: Secondary | ICD-10-CM

## 2012-10-22 DIAGNOSIS — I10 Essential (primary) hypertension: Secondary | ICD-10-CM

## 2012-10-22 DIAGNOSIS — K589 Irritable bowel syndrome without diarrhea: Secondary | ICD-10-CM

## 2012-10-22 DIAGNOSIS — E785 Hyperlipidemia, unspecified: Secondary | ICD-10-CM

## 2012-10-22 DIAGNOSIS — J45909 Unspecified asthma, uncomplicated: Secondary | ICD-10-CM

## 2012-10-22 DIAGNOSIS — J309 Allergic rhinitis, unspecified: Secondary | ICD-10-CM

## 2012-10-22 DIAGNOSIS — F331 Major depressive disorder, recurrent, moderate: Secondary | ICD-10-CM

## 2012-10-22 LAB — LIPID PANEL
Cholesterol: 200 mg/dL (ref 0–200)
HDL: 52.3 mg/dL (ref >39.00)
LDL Cholesterol: 134 mg/dL — ABNORMAL HIGH (ref 0–99)
Total CHOL/HDL Ratio: 4
Triglycerides: 71 mg/dL (ref 0.0–149.0)
VLDL: 14.2 mg/dL (ref 0.0–40.0)

## 2012-10-22 LAB — HEPATIC FUNCTION PANEL
ALT: 22 U/L (ref 0–35)
AST: 23 U/L (ref 0–37)
Albumin: 3.9 g/dL (ref 3.5–5.2)
Alkaline Phosphatase: 77 U/L (ref 39–117)
Bilirubin, Direct: 0 mg/dL (ref 0.0–0.3)
Total Bilirubin: 0.8 mg/dL (ref 0.3–1.2)
Total Protein: 7.4 g/dL (ref 6.0–8.3)

## 2012-10-22 LAB — COMPREHENSIVE METABOLIC PANEL
ALT: 22 U/L (ref 0–35)
AST: 23 U/L (ref 0–37)
Albumin: 3.9 g/dL (ref 3.5–5.2)
Alkaline Phosphatase: 77 U/L (ref 39–117)
BUN: 14 mg/dL (ref 6–23)
CO2: 28 mEq/L (ref 19–32)
Calcium: 9.1 mg/dL (ref 8.4–10.5)
Chloride: 105 mEq/L (ref 96–112)
Creatinine, Ser: 1 mg/dL (ref 0.4–1.2)
GFR: 68.01 mL/min (ref >60.00)
Glucose, Bld: 83 mg/dL (ref 70–99)
Potassium: 4 mEq/L (ref 3.5–5.1)
Sodium: 140 mEq/L (ref 135–145)
Total Bilirubin: 0.8 mg/dL (ref 0.3–1.2)
Total Protein: 7.4 g/dL (ref 6.0–8.3)

## 2012-10-22 LAB — CBC WITH DIFFERENTIAL/PLATELET
Basophils Absolute: 0 10*3/uL (ref 0.0–0.1)
Basophils Relative: 0.8 % (ref 0.0–3.0)
Eosinophils Absolute: 0.1 10*3/uL (ref 0.0–0.7)
Eosinophils Relative: 2.3 % (ref 0.0–5.0)
HCT: 39.5 % (ref 36.0–46.0)
Hemoglobin: 12.4 g/dL (ref 12.0–15.0)
Lymphocytes Relative: 33.1 % (ref 12.0–46.0)
Lymphs Abs: 1.7 10*3/uL (ref 0.7–4.0)
MCHC: 31.6 g/dL (ref 30.0–36.0)
MCV: 72.4 fl — ABNORMAL LOW (ref 78.0–100.0)
Monocytes Absolute: 0.5 10*3/uL (ref 0.1–1.0)
Monocytes Relative: 10.3 % (ref 3.0–12.0)
Neutro Abs: 2.7 10*3/uL (ref 1.4–7.7)
Neutrophils Relative %: 53.5 % (ref 43.0–77.0)
Platelets: 221 10*3/uL (ref 150.0–400.0)
RBC: 5.45 Mil/uL — ABNORMAL HIGH (ref 3.87–5.11)
RDW: 16.6 % — ABNORMAL HIGH (ref 11.5–14.6)
WBC: 5.1 10*3/uL (ref 4.5–10.5)

## 2012-10-22 LAB — HEPATITIS C ANTIBODY: HCV Ab: NEGATIVE

## 2012-10-22 MED ORDER — DICYCLOMINE HCL 10 MG PO CAPS: 10.0000 mg | ORAL_CAPSULE | Freq: Three times a day (TID) | ORAL | Status: DC

## 2012-10-22 MED ORDER — LOSARTAN POTASSIUM 100 MG PO TABS: 100.0000 mg | ORAL_TABLET | Freq: Every day | ORAL | Status: DC

## 2012-10-22 MED ORDER — ZAFIRLUKAST 20 MG PO TABS: 20.0000 mg | ORAL_TABLET | ORAL | Status: DC | PRN

## 2012-10-22 MED ORDER — FLUTICASONE FUROATE 27.5 MCG/SPRAY NA SUSP: 1.0000 | Freq: Every day | NASAL | Status: DC

## 2012-10-22 MED ORDER — POTASSIUM CHLORIDE ER 8 MEQ PO TBCR: 8.0000 meq | EXTENDED_RELEASE_TABLET | Freq: Every day | ORAL | Status: DC

## 2012-10-22 MED ORDER — DILTIAZEM HCL ER COATED BEADS 120 MG PO CP24: 120.0000 mg | ORAL_CAPSULE | Freq: Every day | ORAL | Status: DC

## 2012-10-22 MED ORDER — SERTRALINE HCL 100 MG PO TABS: ORAL_TABLET | ORAL | Status: DC

## 2012-10-22 MED ORDER — LEVALBUTEROL HCL 1.25 MG/3ML IN NEBU: 1.0000 | INHALATION_SOLUTION | RESPIRATORY_TRACT | Status: DC | PRN

## 2012-10-22 MED ORDER — BUDESONIDE-FORMOTEROL FUMARATE 160-4.5 MCG/ACT IN AERO: 2.0000 | INHALATION_SPRAY | Freq: Two times a day (BID) | RESPIRATORY_TRACT | Status: DC

## 2012-10-22 MED ORDER — OMEPRAZOLE 40 MG PO CPDR: 40.0000 mg | DELAYED_RELEASE_CAPSULE | Freq: Every day | ORAL | Status: DC

## 2012-10-22 MED ORDER — GUAIFENESIN ER 600 MG PO TB12: 1200.0000 mg | ORAL_TABLET | Freq: Two times a day (BID) | ORAL | Status: DC

## 2012-10-22 MED ORDER — ALBUTEROL SULFATE HFA 108 (90 BASE) MCG/ACT IN AERS: 2.0000 | INHALATION_SPRAY | Freq: Four times a day (QID) | RESPIRATORY_TRACT | Status: DC | PRN

## 2012-10-22 MED ORDER — FUROSEMIDE 40 MG PO TABS: 40.0000 mg | ORAL_TABLET | Freq: Every day | ORAL | Status: DC

## 2012-10-22 MED ORDER — LATANOPROST 0.005 % OP SOLN: 1.0000 [drp] | Freq: Every day | OPHTHALMIC | Status: AC

## 2012-10-22 MED ORDER — SUMATRIPTAN SUCCINATE 100 MG PO TABS: 100.0000 mg | ORAL_TABLET | Freq: Once | ORAL | Status: DC

## 2012-10-22 NOTE — Progress Notes (Signed)
Subjective:    Patient ID: Dominique Johnston, female    DOB: 27-May-1945, 67 y.o.   MRN: [id number]  HPI Dominique Johnston is here for annual Medicare wellness examination and management of other chronic and acute problems.  She has had a problem of carpal spasm - intermittent and associated with hard grip.   She is also concerned about thermoregulation - she always feels cold. TSH for the past 5 years and at gyn office.   She c/o chronic tired feeling. She takes several sedating meds. She doesn't sleep well.   She is working on weight loss. Informed about the mechanism of action of orlistat. She is already taking medications that contribute to loose stools.   The risk factors are reflected in the social history.  The roster of all physicians providing medical care to patient - is listed in the Snapshot section of the chart.  Activities of daily living:  The patient is 100% inedpendent in all ADLs: dressing, toileting, feeding as well as independent mobility  Home safety : The patient has smoke detectors in the home. Falls - had a fall in the bedroom, step on something slipper.They wear seatbelts. No firearms at home. There is no violence in the home.   There is no risks for hepatitis, STDs or HIV. There is no history of blood transfusion. They have no travel history to infectious disease endemic areas of the world.  The patient has seen their dentist in the last six month. They have seen their eye doctor in the last year. They deny any hearing difficulty and have not had audiologic testing in the last year.    They do not  have excessive sun exposure. Discussed the need for sun protection: hats, long sleeves and use of sunscreen if there is significant sun exposure.   Diet: the importance of a healthy diet is discussed. They do have a healthy diet.  The patient has a regular exercise program: dance therapy, calesthenics, walks , 30 duration, 3-4 per week.  The benefits of regular aerobic  exercise were discussed.  Depression screen: there are no signs or vegative symptoms of depression- irritability, change in appetite, anhedonia, sadness/tearfullness.  Cognitive assessment: the patient manages all their financial and personal affairs and is actively engaged.   The following portions of the patient's history were reviewed and updated as appropriate: allergies, current medications, past family history, past medical history,  past surgical history, past social history  and problem list.  Vision, hearing, body mass index were assessed and reviewed.   During the course of the visit the patient was educated and counseled about appropriate screening and preventive services including : fall prevention , diabetes screening, nutrition counseling, colorectal cancer screening, and recommended immunizations.  Past Medical History  Diagnosis Date  . Allergy     rhinitis  . Headache(784.0)   . Hypertension   . Asthma   . Bronchitis     recurrent  . Depression   . DJD (degenerative joint disease) of knee   . DJD (degenerative joint disease), lumbosacral   . Bouchard nodes (DJD hand)   . IBS (irritable bowel syndrome)   . Headache(784.0)   . Hyperlipidemia   . GERD (gastroesophageal reflux disease)   . Sinusitis, chronic    Past Surgical History  Procedure Laterality Date  . Tubal ligation    . Breast lumpectomy    . Tonsillectomy    . Septoplasty    . Transnasal laryngoscopy    . Orif ankle  fracture  '07    Left ankle   Family History  Problem Relation Age of Onset  . Hypertension Father   . Coronary artery disease Father     MI/ Fatal  . Breast cancer Neg Hx   . Diabetes Neg Hx   . Colon cancer Neg Hx    History   Social History  . Marital Status: Divorced    Spouse Name: N/A    Number of Children: N/A  . Years of Education: 17   Occupational History  .  [employer]    retired   Social History Main Topics  . Smoking status: Never Smoker   .  Smokeless tobacco: Never Used  . Alcohol Use: No  . Drug Use: No  . Sexually Active: Not Currently   Other Topics Concern  . Not on file   Social History Narrative   [name] -BS, has had additional course work.  married '71- after 29 years husband left, divorce final in '06.  1 son- '74, 1 daughter '76; 2 grandchildren. 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 Lives in her own home, daughter and grandchild home with her. ACP - HCPOA dtr - tiffany (c) [phone] [id number]. CPR - ?, no mechanical ventilation except short term care, no prolonged artificial nutrition.     Current Outpatient Prescriptions on File Prior to Visit  Medication Sig Dispense Refill  . albuterol (PROVENTIL HFA;VENTOLIN HFA) 108 (90 BASE) MCG/ACT inhaler Inhale 2 puffs into the lungs every 6 (six) hours as needed.      . ALPRAZolam (XANAX) 0.5 MG tablet Take 1 tablet (0.5 mg total) by mouth 3 (three) times daily as needed.  90 tablet  5  . Ascorbic Acid (VITAMIN C PO) Take 1 tablet by mouth daily.        . budesonide-formoterol (SYMBICORT) 160-4.5 MCG/ACT inhaler Inhale 2 puffs into the lungs 2 (two) times daily.        . chlorpheniramine-HYDROcodone (TUSSIONEX) 10-8 MG/5ML LQCR Take 5 mLs by mouth every 12 (twelve) hours as needed.  140 mL  0  . clidinium-chlordiazePOXIDE (LIBRAX) 2.5-5 MG per capsule TAKE ONE CAPSULE BY MOUTH THREE TIMES DAILY  90 capsule  5  . Cyanocobalamin (B-12 PO) Take by mouth daily.        Marland Kitchen dicyclomine (BENTYL) 10 MG capsule TAKE ONE CAPSULE BY MOUTH THREE TIMES DAILY AS NEEDED  90 capsule  4  . dicyclomine (BENTYL) 10 MG capsule TAKE ONE CAPSULE BY MOUTH THREE TIMES DAILY AS NEEDED  90 capsule  4  . diltiazem (CARDIZEM CD) 120 MG 24 hr capsule TAKE ONE CAPSULE BY MOUTH ONE TIME DAILY  30 capsule  0  . fluticasone (VERAMYST) 27.5 MCG/SPRAY nasal spray 1 spray by Nasal route daily.        . furosemide (LASIX) 40 MG tablet TAKE ONE TABLET BY MOUTH ONE TIME  DAILY  30 tablet  11  . guaiFENesin (MUCINEX) 600 MG 12 hr tablet Take 2 tablets (1,200 mg total) by mouth 2 (two) times daily.  30 tablet  0  . ibuprofen (ADVIL,MOTRIN) 800 MG tablet TAKE ONE TABLET BY MOUTH DAILY AS NEEDED AS DIRECTED  90 tablet  0  . IRON PO Take by mouth daily.        Marland Kitchen latanoprost (XALATAN) 0.005 % ophthalmic solution Place 1 drop into both eyes daily.        Marland Kitchen levalbuterol (XOPENEX) 1.25 MG/3ML nebulizer  solution Take 1 ampule by nebulization as needed.        Marland Kitchen losartan (COZAAR) 100 MG tablet TAKE ONE TABLET BY MOUTH ONE TIME DAILY  30 tablet  11  . multivitamin (THERAGRAN) tablet Take 1 tablet by mouth daily.        Marland Kitchen omeprazole (PRILOSEC) 40 MG capsule Take 1 capsule (40 mg total) by mouth daily.  90 capsule  3  . omeprazole (PRILOSEC) 40 MG capsule TAKE ONE CAPSULE BY MOUTH ONE TIME DAILY  30 capsule  2  . omeprazole (PRILOSEC) 40 MG capsule TAKE ONE CAPSULE BY MOUTH ONE TIME DAILY  30 capsule  1  . potassium chloride (KLOR-CON) 8 MEQ tablet TAKE ONE TABLET BY MOUTH ONE TIME DAILY  30 tablet  11  . sertraline (ZOLOFT) 100 MG tablet TAKE ONE AND ONE-HALF TABLETS BY MOUTH DAILY  45 tablet  2  . SUMAtriptan (IMITREX) 100 MG tablet TAKE ONE TABLET BY MOUTH ONCE AS NEEDED FOR MIGRAINE  6 tablet  2  . zafirlukast (ACCOLATE) 20 MG tablet Take 20 mg by mouth as needed.        . [DISCONTINUED] azelastine (ASTELIN) 137 MCG/SPRAY nasal spray by Nasal route as directed. Use in each nostril as directed       . [DISCONTINUED] diltiazem (DILACOR XR) 120 MG 24 hr capsule Take 1 capsule (120 mg total) by mouth daily.  30 capsule  11  . [DISCONTINUED] levocetirizine (XYZAL) 5 MG tablet Take 5 mg by mouth every evening.         No current facility-administered medications on file prior to visit.     Review of Systems Constitutional:  Negative for fever, chills, activity change and unexpected weight change.  HEENT:  Negative for hearing loss, ear pain, congestion, neck stiffness and  postnasal drip. Negative for sore throat or swallowing problems. Negative for dental complaints.   Eyes: Negative for vision loss or change in visual acuity.  Respiratory: Occasional chest tightness and wheezing. Negative for DOE.   Cardiovascular: Negative for chest pain or palpitations. No decreased exercise tolerance Gastrointestinal: No change in bowel habit. No bloating or gas. No reflux or indigestion Genitourinary: Negative for urgency, frequency, flank pain and difficulty urinating.  Musculoskeletal: Negative for myalgias, back pain, arthralgias and gait problem.  Neurological: Negative for dizziness, tremors, weakness and headaches.  Hematological: Negative for adenopathy.  Psychiatric/Behavioral: Negative for behavioral problems and dysphoric mood.       Objective:   Physical Exam Filed Vitals:   10/22/12 1353  BP: 162/94  Pulse: 62  Temp: 98.2 F (36.8 C)   Wt Readings from Last 3 Encounters:  10/22/12 209 lb 6.4 oz (94.983 kg)  10/22/11 198 lb (89.812 kg)  09/17/10 197 lb (89.359 kg)   Gen'l: well nourished, well developed, overweight AA Woman in no distress HEENT - [place]/AT, EACs/TMs normal, oropharynx with native dentition in good condition - complex bridge work and missing several teeth, no buccal or palatal lesions, posterior pharynx clear, mucous membranes moist. C&S clear, PERRLA, fundi - normal Neck - supple, no thyromegaly Nodes- negative submental, cervical, supraclavicular regions Chest - no deformity, no CVAT Lungs - clea without rales, wheezes. No increased work of breathing Breast - deferred to gyn Cardiovascular - regular rate and rhythm, quiet precordium, no murmurs, rubs or gallops, 2+ radial, DP and PT pulses Abdomen - BS+ x 4, no HSM, no guarding or rebound or tenderness Pelvic - deferred to gyn Rectal - deferred to gyn Extremities -  no clubbing, cyanosis, edema or deformity.  Neuro - A&O x 3, CN II-XII normal, motor strength normal and equal, DTRs 2+  and symmetrical biceps, radial, and patellar tendons. Cerebellar - no tremor, no rigidity, fluid movement and normal gait. Derm - Head, neck, back, abdomen and extremities without suspicious lesions        Assessment & Plan:

## 2012-10-22 NOTE — Patient Instructions (Addendum)
Thanks for coming in to see me.   You appear to be doing well and are medically stable. Will get routine labs today. Results will be on MyChart.  We will renew all your medications.

## 2012-10-25 DIAGNOSIS — Z0000 Encounter for general adult medical examination without abnormal findings: Secondary | ICD-10-CM | POA: Insufficient documentation

## 2012-10-25 NOTE — Assessment & Plan Note (Signed)
Interval history is benign except for flare of bronchitis. Physical exam sans breast and pelvice is normal. Lab results reviewed and are OK. She is current for colorectal cancer screening but is due for follow up study. Mammograms are up to date. Immunizations are complete. Weight management: Diet management: smart food choices, PORTION SIZE CONTROL, regular exercise. Goal - to loose 1-2 lbs.month. Target weight - 200. She is working on diet and exercise.  In summary - a very nice AA woman who appears to be medically stable at todays exam.

## 2012-10-25 NOTE — Assessment & Plan Note (Signed)
By patient report she is doing better.   Plan Continue zoloft 100 mg daily  Continue alprazolam as needed  Continue physical fitness program

## 2012-10-25 NOTE — Assessment & Plan Note (Signed)
Symptoms are adequately controlled with bentyl and librax.

## 2012-10-25 NOTE — Assessment & Plan Note (Signed)
Symptoms as quiescent at this time.   Plan Continue maintenance medications  Follow up with Dr. Telford Callas as directed.

## 2012-10-25 NOTE — Assessment & Plan Note (Signed)
BP Readings from Last 3 Encounters:  10/22/12 162/94  01/18/12 159/90  12/21/11 157/86   Slightly elevated readings.  Plan Continue present medications  Monitor BP at home and report back. For continued elevation will adjust medications.

## 2012-10-25 NOTE — Assessment & Plan Note (Signed)
Last lipid panel with LDL 134, 4 points above goal of 130 or less; HDL 52, better than goal of 40+

## 2012-10-25 NOTE — Assessment & Plan Note (Signed)
Stable symptoms. She is followed at [facility] Asthma and allergy.

## 2012-10-26 ENCOUNTER — Encounter: Payer: Self-pay | Admitting: Internal Medicine

## 2012-11-19 ENCOUNTER — Other Ambulatory Visit: Payer: Self-pay | Admitting: Internal Medicine

## 2012-12-10 ENCOUNTER — Ambulatory Visit (AMBULATORY_SURGERY_CENTER): Payer: Medicare Other | Admitting: *Deleted

## 2012-12-10 ENCOUNTER — Encounter: Payer: Self-pay | Admitting: Internal Medicine

## 2012-12-10 VITALS — Ht 66.0 in | Wt 211.0 lb

## 2012-12-10 DIAGNOSIS — Z1211 Encounter for screening for malignant neoplasm of colon: Secondary | ICD-10-CM

## 2012-12-10 MED ORDER — MOVIPREP 100 G PO SOLR: ORAL | Status: DC

## 2012-12-10 NOTE — Progress Notes (Signed)
No allergies to eggs or soy. No problems with anesthesia.  

## 2012-12-14 ENCOUNTER — Telehealth: Payer: Self-pay | Admitting: Internal Medicine

## 2012-12-14 NOTE — Telephone Encounter (Signed)
Zyrtec added to med list

## 2012-12-24 ENCOUNTER — Ambulatory Visit (AMBULATORY_SURGERY_CENTER): Payer: Medicare Other | Admitting: Internal Medicine

## 2012-12-24 ENCOUNTER — Encounter: Payer: Self-pay | Admitting: Internal Medicine

## 2012-12-24 VITALS — BP 145/96 | HR 55 | Temp 97.8°F | Resp 57 | Ht 66.0 in | Wt 211.0 lb

## 2012-12-24 DIAGNOSIS — Z1211 Encounter for screening for malignant neoplasm of colon: Secondary | ICD-10-CM

## 2012-12-24 MED ORDER — SODIUM CHLORIDE 0.9 % IV SOLN: 500.0000 mL | INTRAVENOUS | Status: DC

## 2012-12-24 NOTE — Progress Notes (Signed)
  [facility] Anesthesia Post-op Note  Patient: Dominique Johnston  Procedure(s) Performed: colonoscopy  Patient Location: [facility] - Recovery Area  Anesthesia Type: Deep Sedation/Propofol  Level of Consciousness: awake, oriented and patient cooperative  Airway and Oxygen Therapy: Patient Spontanous Breathing  Post-op Pain: none  Post-op Assessment:  Post-op Vital signs reviewed, Patient's Cardiovascular Status Stable, Respiratory Function Stable, Patent Airway, No signs of Nausea or vomiting and Pain level controlled  Post-op Vital Signs: Reviewed and stable  Complications: No apparent anesthesia complications  Camia Dipinto E 9:12 AM

## 2012-12-24 NOTE — Patient Instructions (Addendum)
YOU HAD AN ENDOSCOPIC PROCEDURE TODAY AT THE [facility]: Refer to the procedure report that was given to you for any specific questions about what was found during the examination.  If the procedure report does not answer your questions, please call your gastroenterologist to clarify.  If you requested that your care partner not be given the details of your procedure findings, then the procedure report has been included in a sealed envelope for you to review at your convenience later.  YOU SHOULD EXPECT: Some feelings of bloating in the abdomen. Passage of more gas than usual.  Walking can help get rid of the air that was put into your GI tract during the procedure and reduce the bloating. If you had a lower endoscopy (such as a colonoscopy or flexible sigmoidoscopy) you may notice spotting of blood in your stool or on the toilet paper. If you underwent a bowel prep for your procedure, then you may not have a normal bowel movement for a few days.  DIET: Your first meal following the procedure should be a light meal and then it is ok to progress to your normal diet.  A half-sandwich or bowl of soup is an example of a good first meal.  Heavy or fried foods are harder to digest and may make you feel nauseous or bloated.  Likewise meals heavy in dairy and vegetables can cause extra gas to form and this can also increase the bloating.  Drink plenty of fluids but you should avoid alcoholic beverages for 24 hours.  ACTIVITY: Your care partner should take you home directly after the procedure.  You should plan to take it easy, moving slowly for the rest of the day.  You can resume normal activity the day after the procedure however you should NOT DRIVE or use heavy machinery for 24 hours (because of the sedation medicines used during the test).    SYMPTOMS TO REPORT IMMEDIATELY: A gastroenterologist can be reached at any hour.  During normal business hours, 8:30 AM to 5:00 PM Monday through Friday,  call [phone].  After hours and on weekends, please call the GI answering service at [phone] who will take a message and have the physician on call contact you.   Following lower endoscopy (colonoscopy or flexible sigmoidoscopy):  Excessive amounts of blood in the stool  Significant tenderness or worsening of abdominal pains  Swelling of the abdomen that is new, acute  Fever of 100F or higher   FOLLOW UP: Our staff will call the home number listed on your records the next business day following your procedure to check on you and address any questions or concerns that you may have at that time regarding the information given to you following your procedure. This is a courtesy call and so if there is no answer at the home number and we have not heard from you through the emergency physician on call, we will assume that you have returned to your regular daily activities without incident.  SIGNATURES/CONFIDENTIALITY: You and/or your care partner have signed paperwork which will be entered into your electronic medical record.  These signatures attest to the fact that that the information above on your After Visit Summary has been reviewed and is understood.  Full responsibility of the confidentiality of this discharge information lies with you and/or your care-partner.  Continue your normal medications  Please read over information about diverticulosis, hemorrhoids, and high fiber diets  Next colonoscopy in 10 years

## 2012-12-24 NOTE — Op Note (Signed)
[facility] [address] [place], [address]   COLONOSCOPY PROCEDURE REPORT  PATIENT: Dominique Johnston, Dominique Johnston  MR#: [id number] BIRTHDATE: 1946/04/06 , 67  yrs. old GENDER: Female ENDOSCOPIST: Hart Carwin, MD REFERRED XB:[id number] Esther Hardy, M.D. PROCEDURE DATE:  12/24/2012 PROCEDURE:   Colonoscopy, screening First Screening Colonoscopy - Avg.  risk and is 50 yrs.  old or older - No.  Prior Negative Screening - Now for repeat screening. 10 or more years since last screening  History of Adenoma - Now for follow-up colonoscopy & has been > or = to 3 yrs.  N/A  Polyps Removed Today? No.  Recommend repeat exam, <10 yrs? No. ASA CLASS:   Class II INDICATIONS:Average risk patient for colon cancer and last colon 2004. MEDICATIONS: MAC sedation, administered by CRNA and propofol (Diprivan) 200mg  IV  DESCRIPTION OF PROCEDURE:   After the risks benefits and alternatives of the procedure were thoroughly explained, informed consent was obtained.  A digital rectal exam revealed no abnormalities of the rectum.   The LB [id number]  endoscope was introduced through the anus and advanced to the cecum, which was identified by both the appendix and ileocecal valve. No adverse events experienced.   The quality of the prep was excellent, using MoviPrep  The instrument was then slowly withdrawn as the colon was fully examined.      COLON FINDINGS: There was mild diverticulosis noted throughout the entire examined colon with associated muscular hypertrophy. Retroflexed views revealed no abnormalities. The time to cecum=8 minutes 7 seconds.  Withdrawal time=6 minutes 0 seconds.  The scope was withdrawn and the procedure completed. COMPLICATIONS: There were no complications.  ENDOSCOPIC IMPRESSION: 1.   There was mild diverticulosis noted throughout the entire examined colon 2.    internal hemorrhoids  RECOMMENDATIONS: High fiber diet   eSigned:  Hart Carwin, MD  12/24/2012 9:16 AM   cc:

## 2012-12-24 NOTE — Progress Notes (Signed)
Patient did not experience any of the following events: a burn prior to discharge; a fall within the facility; wrong site/side/patient/procedure/implant event; or a hospital transfer or hospital admission upon discharge from the facility. (G8907) Patient did not have preoperative order for IV antibiotic SSI prophylaxis. (G8918)  

## 2012-12-25 ENCOUNTER — Telehealth: Payer: Self-pay

## 2012-12-25 NOTE — Telephone Encounter (Signed)
  Follow up Call-  Call back number 12/24/2012  Post procedure Call Back phone  # [phone]  Permission to leave phone message Yes     Patient questions:  Do you have a fever, pain , or abdominal swelling? no Pain Score  0 *  Have you tolerated food without any problems? yes  Have you been able to return to your normal activities? yes  Do you have any questions about your discharge instructions: Diet   no Medications  no Follow up visit  no  Do you have questions or concerns about your Care? no  Actions: * If pain score is 4 or above: No action needed, pain <4.

## 2013-02-05 ENCOUNTER — Other Ambulatory Visit: Payer: Self-pay

## 2013-02-05 DIAGNOSIS — Z1231 Encounter for screening mammogram for malignant neoplasm of breast: Secondary | ICD-10-CM

## 2013-02-08 ENCOUNTER — Other Ambulatory Visit: Payer: Self-pay | Admitting: Internal Medicine

## 2013-02-23 ENCOUNTER — Emergency Department (HOSPITAL_COMMUNITY)
Admission: EM | Admit: 2013-02-23 | Discharge: 2013-02-23 | Disposition: A | Discharge status: Home / Self Care | Payer: Medicare Other | Source: Home / Self Care | Attending: Family Medicine | Admitting: Family Medicine

## 2013-02-23 ENCOUNTER — Encounter (HOSPITAL_COMMUNITY): Payer: Self-pay | Admitting: Emergency Medicine

## 2013-02-23 DIAGNOSIS — J329 Chronic sinusitis, unspecified: Secondary | ICD-10-CM

## 2013-02-23 MED ORDER — AMOXICILLIN-POT CLAVULANATE 875-125 MG PO TABS: 1.0000 | ORAL_TABLET | Freq: Two times a day (BID) | ORAL | Status: DC

## 2013-02-23 NOTE — ED Notes (Signed)
C/o ear ache and headache, facial pain, nasal drip and sob

## 2013-02-23 NOTE — ED Provider Notes (Signed)
CSN: [id number]     Arrival date & time 02/23/13  1520 History   First MD Initiated Contact with Patient 02/23/13 1720     Chief Complaint  Patient presents with  . Otalgia  . Headache   (Consider location/radiation/quality/duration/timing/severity/associated sxs/prior Treatment) Patient is a 67 y.o. female presenting with ear pain and headaches. The history is provided by the patient. No language interpreter was used.  Otalgia Location:  Bilateral Behind ear:  No abnormality Severity:  Mild Onset quality:  Gradual Timing:  Constant Progression:  Worsening Chronicity:  New Associated symptoms: headaches   Headache Associated symptoms: ear pain     Past Medical History  Diagnosis Date  . Allergy     rhinitis  . Headache(784.0)   . Hypertension   . Asthma   . Bronchitis     recurrent  . Depression   . DJD (degenerative joint disease) of knee   . DJD (degenerative joint disease), lumbosacral   . Bouchard nodes (DJD hand)   . IBS (irritable bowel syndrome)   . Headache(784.0)   . Hyperlipidemia   . GERD (gastroesophageal reflux disease)   . Sinusitis, chronic    Past Surgical History  Procedure Laterality Date  . Tubal ligation  1977  . Breast lumpectomy  1998    benign; left breast  . Tonsillectomy    . Septoplasty  2000  . Transnasal laryngoscopy  2005  . Orif ankle fracture  '07    Left ankle   Family History  Problem Relation Age of Onset  . Hypertension Father   . Coronary artery disease Father     MI/ Fatal  . Breast cancer Neg Hx   . Diabetes Neg Hx   . Colon cancer Neg Hx    History  Substance Use Topics  . Smoking status: Never Smoker   . Smokeless tobacco: Never Used  . Alcohol Use: No   OB History   Grav Para Term Preterm Abortions TAB SAB Ect Mult Living                 Review of Systems  HENT: Positive for ear pain.   Neurological: Positive for headaches.  All other systems reviewed and are negative.    Allergies  Enalapril  maleate and Sulfamethoxazole-trimethoprim  Home Medications   Current Outpatient Rx  Name  Route  Sig  Dispense  Refill  . albuterol (PROVENTIL HFA;VENTOLIN HFA) 108 (90 BASE) MCG/ACT inhaler   Inhalation   Inhale 2 puffs into the lungs every 6 (six) hours as needed.   18 g   11   . ALPRAZolam (XANAX) 0.5 MG tablet   Oral   Take 1 tablet (0.5 mg total) by mouth 3 (three) times daily as needed.   90 tablet   5   . amoxicillin-clavulanate (AUGMENTIN) 875-125 MG per tablet   Oral   Take 1 tablet by mouth every 12 (twelve) hours.   20 tablet   0   . Ascorbic Acid (VITAMIN C PO)   Oral   Take 1 tablet by mouth daily.           . budesonide-formoterol (SYMBICORT) 160-4.5 MCG/ACT inhaler   Inhalation   Inhale 2 puffs into the lungs 2 (two) times daily.   1 Inhaler   11   . Cetirizine HCl (ZYRTEC PO)   Oral   Take by mouth daily.         . clidinium-chlordiazePOXIDE (LIBRAX) 2.5-5 MG per capsule  TAKE ONE CAPSULE BY MOUTH THREE TIMES DAILY   90 capsule   5   . Cyanocobalamin (B-12 PO)   Oral   Take by mouth daily.           Marland Kitchen dicyclomine (BENTYL) 10 MG capsule   Oral   Take 1 capsule (10 mg total) by mouth 4 (four) times daily -  before meals and at bedtime.   90 capsule   5   . diltiazem (CARDIZEM CD) 120 MG 24 hr capsule   Oral   Take 1 capsule (120 mg total) by mouth daily.   30 capsule   5   . fluticasone (VERAMYST) 27.5 MCG/SPRAY nasal spray   Nasal   Place 1 spray into the nose daily.   10 g   11   . furosemide (LASIX) 40 MG tablet   Oral   Take 1 tablet (40 mg total) by mouth daily.   30 tablet   5   . guaiFENesin (MUCINEX) 600 MG 12 hr tablet   Oral   Take 2 tablets (1,200 mg total) by mouth 2 (two) times daily.   60 tablet   5   . ibuprofen (ADVIL,MOTRIN) 800 MG tablet      TAKE ONE TABLET BY MOUTH DAILY AS NEEDED AS DIRECTED   90 tablet   0   . IRON PO   Oral   Take by mouth daily.           Marland Kitchen KLOR-CON 8 MEQ  tablet      TAKE ONE TABLET BY MOUTH ONE TIME DAILY   30 tablet   5   . latanoprost (XALATAN) 0.005 % ophthalmic solution   Both Eyes   Place 1 drop into both eyes daily.   2.5 mL   11   . levalbuterol (XOPENEX) 1.25 MG/3ML nebulizer solution   Nebulization   Take 1.25 mg by nebulization as needed.   72 mL   11   . losartan (COZAAR) 100 MG tablet   Oral   Take 1 tablet (100 mg total) by mouth daily.   30 tablet   5   . multivitamin (THERAGRAN) tablet   Oral   Take 1 tablet by mouth daily.           Marland Kitchen omeprazole (PRILOSEC) 40 MG capsule   Oral   Take 1 capsule (40 mg total) by mouth daily.   30 capsule   5   . sertraline (ZOLOFT) 100 MG tablet      TAKE ONE AND ONE HALF TABLET DAILY   45 tablet   2   . SUMAtriptan (IMITREX) 100 MG tablet   Oral   Take 1 tablet (100 mg total) by mouth once. Take 1 tablet at onset of headache   6 tablet   2   . zafirlukast (ACCOLATE) 20 MG tablet   Oral   Take 1 tablet (20 mg total) by mouth as needed.   30 tablet   0    BP 182/93  Pulse 60  Temp(Src) 99 F (37.2 C) (Oral)  Resp 17  SpO2 97% Physical Exam  Nursing note and vitals reviewed. Constitutional: She appears well-developed and well-nourished.  HENT:  Right Ear: External ear normal.  Left Ear: External ear normal.  Tender bilat maxillary sinuses  Eyes: Conjunctivae are normal. Pupils are equal, round, and reactive to light.  Neck: Normal range of motion. Neck supple.  Cardiovascular: Normal rate.   Pulmonary/Chest: Effort normal.  Abdominal: Soft.  Neurological: She is alert.  Skin: Skin is warm.  Psychiatric: She has a normal mood and affect.    ED Course  Procedures (including critical care time) Labs Review Labs Reviewed - No data to display Imaging Review No results found.  EKG Interpretation     Ventricular Rate:    PR Interval:    QRS Duration:   QT Interval:    QTC Calculation:   R Axis:     Text Interpretation:               MDM   1. Sinus infection    augmentin [address]    Lonia Skinner [place], [place] 02/23/13 1753

## 2013-02-24 ENCOUNTER — Ambulatory Visit: Payer: Medicare Other

## 2013-02-24 NOTE — ED Provider Notes (Signed)
Medical screening examination/treatment/procedure(s) were performed by a resident physician or non-physician practitioner and as the supervising physician I was immediately available for consultation/collaboration.  Evan Corey, MD    Evan S Corey, MD 02/24/13 0755 

## 2013-03-18 ENCOUNTER — Other Ambulatory Visit: Payer: Self-pay

## 2013-04-10 ENCOUNTER — Other Ambulatory Visit: Payer: Self-pay | Admitting: Internal Medicine

## 2013-04-16 ENCOUNTER — Other Ambulatory Visit: Payer: Self-pay | Admitting: Internal Medicine

## 2013-05-09 ENCOUNTER — Other Ambulatory Visit: Payer: Self-pay | Admitting: Internal Medicine

## 2013-05-15 ENCOUNTER — Emergency Department (HOSPITAL_COMMUNITY)
Admission: EM | Admit: 2013-05-15 | Discharge: 2013-05-15 | Disposition: A | Discharge status: Home / Self Care | Payer: Medicare Other | Source: Home / Self Care | Attending: Family Medicine | Admitting: Family Medicine

## 2013-05-15 ENCOUNTER — Encounter (HOSPITAL_COMMUNITY): Payer: Self-pay | Admitting: Emergency Medicine

## 2013-05-15 DIAGNOSIS — J40 Bronchitis, not specified as acute or chronic: Secondary | ICD-10-CM

## 2013-05-15 DIAGNOSIS — J45901 Unspecified asthma with (acute) exacerbation: Secondary | ICD-10-CM

## 2013-05-15 MED ORDER — IPRATROPIUM-ALBUTEROL 0.5-2.5 (3) MG/3ML IN SOLN
RESPIRATORY_TRACT | Status: AC
  Filled 2013-05-15: qty 3

## 2013-05-15 MED ORDER — ALBUTEROL SULFATE HFA 108 (90 BASE) MCG/ACT IN AERS: 2.0000 | INHALATION_SPRAY | Freq: Four times a day (QID) | RESPIRATORY_TRACT | Status: DC | PRN

## 2013-05-15 MED ORDER — PREDNISONE 10 MG PO TABS: 30.0000 mg | ORAL_TABLET | Freq: Every day | ORAL | Status: DC

## 2013-05-15 MED ORDER — ALBUTEROL SULFATE (5 MG/ML) 0.5% IN NEBU
5.0000 mg | INHALATION_SOLUTION | Freq: Once | RESPIRATORY_TRACT | Status: AC
  Administered 2013-05-15: 5 mg via RESPIRATORY_TRACT

## 2013-05-15 MED ORDER — AZITHROMYCIN 250 MG PO TABS: 250.0000 mg | ORAL_TABLET | Freq: Every day | ORAL | Status: DC

## 2013-05-15 MED ORDER — IPRATROPIUM BROMIDE 0.02 % IN SOLN
0.5000 mg | Freq: Once | RESPIRATORY_TRACT | Status: AC
  Administered 2013-05-15: 0.5 mg via RESPIRATORY_TRACT

## 2013-05-15 MED ORDER — GUAIFENESIN-CODEINE 100-10 MG/5ML PO SOLN: 5.0000 mL | Freq: Every evening | ORAL | Status: DC | PRN

## 2013-05-15 NOTE — ED Notes (Signed)
C/o cold sx  States she has fever, productive brown mucous cough and congestion OTC medications taken but no relief. Denies nausea, vomiting, and diarrhea

## 2013-05-15 NOTE — Discharge Instructions (Signed)
Thank you for coming in today. Take prednisone and azithromycin daily for 5 days. Use albuterol as needed. Use cough medication at bedtime as needed. Call or go to the emergency room if you get worse, have trouble breathing, have chest pains, or palpitations.   Asthma, Adult Asthma is a recurring condition in which the airways tighten and narrow. Asthma can make it difficult to breathe. It can cause coughing, wheezing, and shortness of breath. Asthma episodes (also called asthma attacks) range from minor to life-threatening. Asthma cannot be cured, but medicines and lifestyle changes can help control it. CAUSES Asthma is believed to be caused by inherited (genetic) and environmental factors, but its exact cause is unknown. Asthma may be triggered by allergens, lung infections, or irritants in the air. Asthma triggers are different for each person. Common triggers include:   Animal dander.  Dust mites.  Cockroaches.  Pollen from trees or grass.  Mold.  Smoke.  Air pollutants such as dust, household cleaners, hair sprays, aerosol sprays, paint fumes, strong chemicals, or strong odors.  Cold air, weather changes, and winds (which increase molds and pollens in the air).  Strong emotional expressions such as crying or laughing hard.  Stress.  Certain medicines (such as aspirin) or types of drugs (such as beta-blockers).  Sulfites in foods and drinks. Foods and drinks that may contain sulfites include dried fruit, potato chips, and sparkling grape juice.  Infections or inflammatory conditions such as the flu, a cold, or an inflammation of the nasal membranes (rhinitis).  Gastroesophageal reflux disease (GERD).  Exercise or strenuous activity. SYMPTOMS Symptoms may occur immediately after asthma is triggered or many hours later. Symptoms include:  Wheezing.  Excessive nighttime or early morning coughing.  Frequent or severe coughing with a common cold.  Chest  tightness.  Shortness of breath. DIAGNOSIS  The diagnosis of asthma is made by a review of your medical history and a physical exam. Tests may also be performed. These may include:  Lung function studies. These tests show how much air you breath in and out.  Allergy tests.  Imaging tests such as X-rays. TREATMENT  Asthma cannot be cured, but it can usually be controlled. Treatment involves identifying and avoiding your asthma triggers. It also involves medicines. There are 2 classes of medicine used for asthma treatment:   Controller medicines. These prevent asthma symptoms from occurring. They are usually taken every day.  Reliever or rescue medicines. These quickly relieve asthma symptoms. They are used as needed and provide short-term relief. Your health care provider will help you create an asthma action plan. An asthma action plan is a written plan for managing and treating your asthma attacks. It includes a list of your asthma triggers and how they may be avoided. It also includes information on when medicines should be taken and when their dosage should be changed. An action plan may also involve the use of a device called a peak flow meter. A peak flow meter measures how well the lungs are working. It helps you monitor your condition. HOME CARE INSTRUCTIONS   Take medicine as directed by your health care provider. Speak with your health care provider if you have questions about how or when to take the medicines.  Use a peak flow meter as directed by your health care provider. Record and keep track of readings.  Understand and use the action plan to help minimize or stop an asthma attack without needing to seek medical care.  Control your home environment  in the following ways to help prevent asthma attacks:  Do not smoke. Avoid being exposed to secondhand smoke.  Change your heating and air conditioning filter regularly.  Limit your use of fireplaces and wood stoves.  Get  rid of pests (such as roaches and mice) and their droppings.  Throw away plants if you see mold on them.  Clean your floors and dust regularly. Use unscented cleaning products.  Try to have someone else vacuum for you regularly. Stay out of rooms while they are being vacuumed and for a short while afterward. If you vacuum, use a dust mask from a hardware store, a double-layered or microfilter vacuum cleaner bag, or a vacuum cleaner with a HEPA filter.  Replace carpet with wood, tile, or vinyl flooring. Carpet can trap dander and dust.  Use allergy-proof pillows, mattress covers, and box spring covers.  Wash bed sheets and blankets every week in hot water and dry them in a dryer.  Use blankets that are made of polyester or cotton.  Clean bathrooms and kitchens with bleach. If possible, have someone repaint the walls in these rooms with mold-resistant paint. Keep out of the rooms that are being cleaned and painted.  Wash hands frequently. SEEK MEDICAL CARE IF:   You have wheezing, shortness of breath, or a cough even if taking medicine to prevent attacks.  The colored mucus you cough up (sputum) is thicker than usual.  Your sputum changes from clear or white to yellow, green, gray, or bloody.  You have any problems that may be related to the medicines you are taking (such as a rash, itching, swelling, or trouble breathing).  You are using a reliever medicine more than 2 3 times per week.  Your peak flow is still at 50 79% of you personal best after following your action plan for 1 hour. SEEK IMMEDIATE MEDICAL CARE IF:   You seem to be getting worse and are unresponsive to treatment during an asthma attack.  You are short of breath even at rest.  You get short of breath when doing very little physical activity.  You have difficulty eating, drinking, or talking due to asthma symptoms.  You develop chest pain.  You develop a fast heartbeat.  You have a bluish color to your  lips or fingernails.  You are lightheaded, dizzy, or faint.  Your peak flow is less than 50% of your personal best.  You have a fever or persistent symptoms for more than 2 3 days.  You have a fever and symptoms suddenly get worse. MAKE SURE YOU:   Understand these instructions.  Will watch your condition.  Will get help right away if you are not doing well or get worse. Document Released: 04/29/2005 Document Revised: 12/30/2012 Document Reviewed: 11/26/2012 [facility] Patient Information 2014 [place], [place].

## 2013-05-16 NOTE — ED Provider Notes (Signed)
Dominique Johnston is a 68 y.o. female who presents to Urgent Care today for 5 days of congestion cough mild fever or chills and sinus drainage. Patient has tried over-the-counter medications which helped a bit. She does have a history of asthma and has tried using her albuterol inhaler as well which helps some. She denies any nausea vomiting or diarrhea or significant breathing. Feels well otherwise.   Past Medical History  Diagnosis Date  . Allergy     rhinitis  . Headache(784.0)   . Hypertension   . Asthma   . Bronchitis     recurrent  . Depression   . DJD (degenerative joint disease) of knee   . DJD (degenerative joint disease), lumbosacral   . Bouchard nodes (DJD hand)   . IBS (irritable bowel syndrome)   . Headache(784.0)   . Hyperlipidemia   . GERD (gastroesophageal reflux disease)   . Sinusitis, chronic    History  Substance Use Topics  . Smoking status: Never Smoker   . Smokeless tobacco: Never Used  . Alcohol Use: No   ROS as above Medications reviewed. No current facility-administered medications for this encounter.   Current Outpatient Prescriptions  Medication Sig Dispense Refill  . albuterol (PROVENTIL HFA;VENTOLIN HFA) 108 (90 BASE) MCG/ACT inhaler Inhale 2 puffs into the lungs every 6 (six) hours as needed.  18 g  1  . ALPRAZolam (XANAX) 0.5 MG tablet TAKE 1 TABLET 3 TIMES A DAY AS NEEDED  90 tablet  0  . amoxicillin-clavulanate (AUGMENTIN) 875-125 MG per tablet Take 1 tablet by mouth every 12 (twelve) hours.  20 tablet  0  . Ascorbic Acid (VITAMIN C PO) Take 1 tablet by mouth daily.        Marland Kitchen. azithromycin (ZITHROMAX) 250 MG tablet Take 1 tablet (250 mg total) by mouth daily. Take first 2 tablets together, then 1 every day until finished.  6 tablet  0  . budesonide-formoterol (SYMBICORT) 160-4.5 MCG/ACT inhaler Inhale 2 puffs into the lungs 2 (two) times daily.  1 Inhaler  11  . Cetirizine HCl (ZYRTEC PO) Take by mouth daily.      . clidinium-chlordiazePOXIDE  (LIBRAX) 2.5-5 MG per capsule TAKE ONE CAPSULE BY MOUTH THREE TIMES DAILY  90 capsule  5  . Cyanocobalamin (B-12 PO) Take by mouth daily.        Marland Kitchen. dicyclomine (BENTYL) 10 MG capsule Take 1 capsule (10 mg total) by mouth 4 (four) times daily -  before meals and at bedtime.  90 capsule  5  . diltiazem (CARDIZEM CD) 120 MG 24 hr capsule Take 1 capsule (120 mg total) by mouth daily.  30 capsule  5  . fluticasone (VERAMYST) 27.5 MCG/SPRAY nasal spray Place 1 spray into the nose daily.  10 g  11  . furosemide (LASIX) 40 MG tablet Take 1 tablet (40 mg total) by mouth daily.  30 tablet  5  . guaiFENesin (MUCINEX) 600 MG 12 hr tablet Take 2 tablets (1,200 mg total) by mouth 2 (two) times daily.  60 tablet  5  . guaiFENesin-codeine 100-10 MG/5ML syrup Take 5 mLs by mouth at bedtime as needed for cough.  120 mL  0  . ibuprofen (ADVIL,MOTRIN) 800 MG tablet TAKE ONE TABLET BY MOUTH DAILY AS NEEDED AS DIRECTED  90 tablet  0  . IRON PO Take by mouth daily.        Marland Kitchen. KLOR-CON 8 MEQ tablet TAKE ONE TABLET BY MOUTH ONE TIME DAILY  30 tablet  5  . latanoprost (XALATAN) 0.005 % ophthalmic solution Place 1 drop into both eyes daily.  2.5 mL  11  . levalbuterol (XOPENEX) 1.25 MG/3ML nebulizer solution Take 1.25 mg by nebulization as needed.  72 mL  11  . losartan (COZAAR) 100 MG tablet TAKE 1 TABLET (100 MG TOTAL) BY MOUTH DAILY.  30 tablet  5  . multivitamin (THERAGRAN) tablet Take 1 tablet by mouth daily.        Marland Kitchen omeprazole (PRILOSEC) 40 MG capsule Take 1 capsule (40 mg total) by mouth daily.  30 capsule  5  . predniSONE (DELTASONE) 10 MG tablet Take 3 tablets (30 mg total) by mouth daily.  15 tablet  0  . sertraline (ZOLOFT) 100 MG tablet TAKE ONE AND ONE HALF TABLET DAILY  45 tablet  2  . SUMAtriptan (IMITREX) 100 MG tablet Take 1 tablet (100 mg total) by mouth once. Take 1 tablet at onset of headache  6 tablet  2  . zafirlukast (ACCOLATE) 20 MG tablet Take 1 tablet (20 mg total) by mouth as needed.  30 tablet  0   . [DISCONTINUED] azelastine (ASTELIN) 137 MCG/SPRAY nasal spray by Nasal route as directed. Use in each nostril as directed       . [DISCONTINUED] diltiazem (DILACOR XR) 120 MG 24 hr capsule Take 1 capsule (120 mg total) by mouth daily.  30 capsule  11  . [DISCONTINUED] levocetirizine (XYZAL) 5 MG tablet Take 5 mg by mouth every evening.          Exam:  BP 157/85  Pulse 71  Temp(Src) 100.1 F (37.8 C) (Oral)  Resp 18  SpO2 95% Gen: Well NAD HEENT: EOMI,  MMM tympanic membranes are normal appearing bilaterally. Posterior pharynx with mild cobblestoning. Nontender maxillary sinuses to percussion. Lungs: Normal work of breathing. Coarse breath sounds bilaterally. Lung exam improved following nebulizer treatment. Heart: RRR no MRG Abd: NABS, Soft. NT, ND Exts: Non edematous BL  LE, warm and well perfused.   Patient was given a DuoNeb nebulizer treatment and had moderate improvement in symptoms.  Assessment and Plan: 68 y.o. female with bronchitis. Plan treatment with azithromycin, low dose and short duration prednisone, codeine containing cough medication and albuterol. We'll followup with primary care provider if not improving. Discussed warning signs or symptoms. Please see discharge instructions. Patient expresses understanding.      Rodolph Bong, MD 05/16/13 [id number]

## 2013-05-20 ENCOUNTER — Other Ambulatory Visit: Payer: Self-pay | Admitting: Internal Medicine

## 2013-06-21 ENCOUNTER — Telehealth: Payer: Self-pay | Admitting: *Deleted

## 2013-06-21 NOTE — Telephone Encounter (Signed)
Patient phoned stating that her insurance company will not cover the following meds & requests alternate orders   Dicyclomine 10 mg tabs ordered now, ins co will cover 20 mg tabs (order 20 mg and instruct to take 1/2 tab?)  Xanax 0.5 mg tabs ordered now, ins co will not cover the alprazalam tab-alternate suggestions by ins co    clorazetate     Diazepam    Lorazepam  Please advise.  CB# [phone]

## 2013-06-24 NOTE — Telephone Encounter (Signed)
Please review and advise.

## 2013-06-25 NOTE — Telephone Encounter (Signed)
monday

## 2013-06-25 NOTE — Telephone Encounter (Signed)
Patient has phoned again this morning asking for a status update on her meds.  Please advise.  CB# [phone]

## 2013-07-01 NOTE — Telephone Encounter (Signed)
Appt on Feb. 24.

## 2013-07-06 ENCOUNTER — Ambulatory Visit: Payer: Medicare Other | Admitting: Internal Medicine

## 2013-07-11 ENCOUNTER — Emergency Department (HOSPITAL_COMMUNITY)
Admission: EM | Admit: 2013-07-11 | Discharge: 2013-07-11 | Disposition: A | Discharge status: Home / Self Care | Payer: Medicare Other | Source: Home / Self Care

## 2013-07-11 ENCOUNTER — Encounter (HOSPITAL_COMMUNITY): Payer: Self-pay | Admitting: Emergency Medicine

## 2013-07-11 DIAGNOSIS — J45901 Unspecified asthma with (acute) exacerbation: Secondary | ICD-10-CM

## 2013-07-11 DIAGNOSIS — J069 Acute upper respiratory infection, unspecified: Secondary | ICD-10-CM

## 2013-07-11 DIAGNOSIS — R509 Fever, unspecified: Secondary | ICD-10-CM

## 2013-07-11 LAB — POCT RAPID STREP A: Streptococcus, Group A Screen (Direct): NEGATIVE

## 2013-07-11 MED ORDER — IPRATROPIUM-ALBUTEROL 0.5-2.5 (3) MG/3ML IN SOLN
3.0000 mL | Freq: Once | RESPIRATORY_TRACT | Status: AC
  Administered 2013-07-11: 3 mL via RESPIRATORY_TRACT

## 2013-07-11 MED ORDER — TRIAMCINOLONE ACETONIDE 40 MG/ML IJ SUSP
INTRAMUSCULAR | Status: AC
  Filled 2013-07-11: qty 1

## 2013-07-11 MED ORDER — IPRATROPIUM-ALBUTEROL 0.5-2.5 (3) MG/3ML IN SOLN
RESPIRATORY_TRACT | Status: AC
  Filled 2013-07-11: qty 3

## 2013-07-11 MED ORDER — TRIAMCINOLONE ACETONIDE 40 MG/ML IJ SUSP
60.0000 mg | Freq: Once | INTRAMUSCULAR | Status: AC
  Administered 2013-07-11: 60 mg via INTRAMUSCULAR

## 2013-07-11 MED ORDER — SODIUM CHLORIDE 0.9 % IJ SOLN
INTRAMUSCULAR | Status: AC
  Filled 2013-07-11: qty 3

## 2013-07-11 MED ORDER — PREDNISONE 10 MG PO KIT: PACK | ORAL | Status: DC

## 2013-07-11 MED ORDER — AZITHROMYCIN 250 MG PO TABS: 250.0000 mg | ORAL_TABLET | Freq: Every day | ORAL | Status: DC

## 2013-07-11 NOTE — ED Notes (Signed)
Pt c/o sx including congestion, dry cough to the point of heaving, SOB and chest pain x 3 days. Fever of 101 last night. Pt reports she took delsym and ibuprofen with no relief. Pt is alert and oriented and in no acute distress.

## 2013-07-11 NOTE — ED Provider Notes (Signed)
CSN: [id number]     Arrival date & time 07/11/13  9166 History   First MD Initiated Contact with Patient 07/11/13 1015     Chief Complaint  Patient presents with  . URI   (Consider location/radiation/quality/duration/timing/severity/associated sxs/prior Treatment) HPI Comments: History as above. Upper respiratory congestion and chest congestion associated with fever and PND for these same frequent cough.  Patient is a 68 y.o. female presenting with URI.  URI Presenting symptoms: congestion, cough, fever and rhinorrhea   Presenting symptoms: no fatigue   Associated symptoms: wheezing   Associated symptoms: no neck pain     Past Medical History  Diagnosis Date  . Allergy     rhinitis  . Headache(784.0)   . Hypertension   . Asthma   . Bronchitis     recurrent  . Depression   . DJD (degenerative joint disease) of knee   . DJD (degenerative joint disease), lumbosacral   . Bouchard nodes (DJD hand)   . IBS (irritable bowel syndrome)   . Headache(784.0)   . Hyperlipidemia   . GERD (gastroesophageal reflux disease)   . Sinusitis, chronic    Past Surgical History  Procedure Laterality Date  . Tubal ligation  1977  . Breast lumpectomy  1998    benign; left breast  . Tonsillectomy    . Septoplasty  2000  . Transnasal laryngoscopy  2005  . Orif ankle fracture  '07    Left ankle   Family History  Problem Relation Age of Onset  . Hypertension Father   . Coronary artery disease Father     MI/ Fatal  . Breast cancer Neg Hx   . Diabetes Neg Hx   . Colon cancer Neg Hx    History  Substance Use Topics  . Smoking status: Never Smoker   . Smokeless tobacco: Never Used  . Alcohol Use: No   OB History   Grav Para Term Preterm Abortions TAB SAB Ect Mult Living                 Review of Systems  Constitutional: Positive for fever and activity change. Negative for chills, appetite change and fatigue.  HENT: Positive for congestion, postnasal drip and rhinorrhea. Negative  for facial swelling.   Eyes: Negative.   Respiratory: Positive for cough, shortness of breath and wheezing.   Cardiovascular: Negative.   Gastrointestinal: Negative.   Genitourinary: Negative.   Musculoskeletal: Negative for neck pain and neck stiffness.  Skin: Negative for pallor and rash.  Neurological: Negative.     Allergies  Enalapril maleate and Sulfamethoxazole-trimethoprim  Home Medications   Current Outpatient Rx  Name  Route  Sig  Dispense  Refill  . albuterol (PROVENTIL HFA;VENTOLIN HFA) 108 (90 BASE) MCG/ACT inhaler   Inhalation   Inhale 2 puffs into the lungs every 6 (six) hours as needed.   18 g   1   . ALPRAZolam (XANAX) 0.5 MG tablet      TAKE 1 TABLET 3 TIMES A DAY AS NEEDED   90 tablet   0   . Ascorbic Acid (VITAMIN C PO)   Oral   Take 1 tablet by mouth daily.           Marland Kitchen azithromycin (ZITHROMAX) 250 MG tablet   Oral   Take 1 tablet (250 mg total) by mouth daily. 2 tabs po on day 1, 1 tab po on days 2-5   6 tablet   0   . budesonide-formoterol (SYMBICORT) 160-4.5 MCG/ACT inhaler  Inhalation   Inhale 2 puffs into the lungs 2 (two) times daily.   1 Inhaler   11   . Cetirizine HCl (ZYRTEC PO)   Oral   Take by mouth daily.         . clidinium-chlordiazePOXIDE (LIBRAX) 2.5-5 MG per capsule      TAKE ONE CAPSULE BY MOUTH THREE TIMES DAILY   90 capsule   5   . Cyanocobalamin (B-12 PO)   Oral   Take by mouth daily.           Marland Kitchen dicyclomine (BENTYL) 10 MG capsule   Oral   Take 1 capsule (10 mg total) by mouth 4 (four) times daily -  before meals and at bedtime.   90 capsule   5   . diltiazem (CARDIZEM CD) 120 MG 24 hr capsule      TAKE 1 CAPSULE (120 MG TOTAL) BY MOUTH DAILY.   30 capsule   5   . fluticasone (VERAMYST) 27.5 MCG/SPRAY nasal spray   Nasal   Place 1 spray into the nose daily.   10 g   11   . furosemide (LASIX) 40 MG tablet   Oral   Take 1 tablet (40 mg total) by mouth daily.   30 tablet   5   .  guaiFENesin (MUCINEX) 600 MG 12 hr tablet   Oral   Take 2 tablets (1,200 mg total) by mouth 2 (two) times daily.   60 tablet   5   . guaiFENesin-codeine 100-10 MG/5ML syrup   Oral   Take 5 mLs by mouth at bedtime as needed for cough.   120 mL   0   . ibuprofen (ADVIL,MOTRIN) 800 MG tablet      TAKE ONE TABLET BY MOUTH DAILY AS NEEDED AS DIRECTED   90 tablet   0   . IRON PO   Oral   Take by mouth daily.           Marland Kitchen KLOR-CON 8 MEQ tablet      TAKE ONE TABLET BY MOUTH ONE TIME DAILY   30 tablet   5   . latanoprost (XALATAN) 0.005 % ophthalmic solution   Both Eyes   Place 1 drop into both eyes daily.   2.5 mL   11   . levalbuterol (XOPENEX) 1.25 MG/3ML nebulizer solution   Nebulization   Take 1.25 mg by nebulization as needed.   72 mL   11   . losartan (COZAAR) 100 MG tablet      TAKE 1 TABLET (100 MG TOTAL) BY MOUTH DAILY.   30 tablet   5   . multivitamin (THERAGRAN) tablet   Oral   Take 1 tablet by mouth daily.           Marland Kitchen omeprazole (PRILOSEC) 40 MG capsule   Oral   Take 1 capsule (40 mg total) by mouth daily.   30 capsule   5   . PredniSONE 10 MG KIT      Take as directed. Take with food.  Disp: 1 taper dosage pack (10 mg for 1 week)   21 each   0   . sertraline (ZOLOFT) 100 MG tablet      TAKE ONE AND ONE HALF TABLET DAILY   45 tablet   2   . SUMAtriptan (IMITREX) 100 MG tablet   Oral   Take 1 tablet (100 mg total) by mouth once. Take 1 tablet at onset of headache  6 tablet   2   . zafirlukast (ACCOLATE) 20 MG tablet   Oral   Take 1 tablet (20 mg total) by mouth as needed.   30 tablet   0    BP 147/86  Pulse 78  Temp(Src) 99.4 F (37.4 C) (Oral)  Resp 16  SpO2 99% Physical Exam  Nursing note and vitals reviewed. Constitutional: She is oriented to person, place, and time. She appears well-developed and well-nourished. No distress.  HENT:  Mouth/Throat: No oropharyngeal exudate.  Bilateral TMs are normal Oropharynx  moist with thick PND.  Eyes: Conjunctivae and EOM are normal.  Neck: Normal range of motion. Neck supple.  Cardiovascular: Normal rate, regular rhythm and normal heart sounds.   Pulmonary/Chest: Effort normal. No respiratory distress. She has wheezes.  Prolonged expiratory phase  Musculoskeletal: Normal range of motion. She exhibits no edema.  Lymphadenopathy:    She has no cervical adenopathy.  Neurological: She is alert and oriented to person, place, and time.  Skin: Skin is warm and dry. No rash noted.  Psychiatric: She has a normal mood and affect.    ED Course  Procedures (including critical care time) Labs Review Labs Reviewed  POCT RAPID STREP A ([facility]RG CARE ONLY)   Imaging Review No results found.   MDM   1. URI (upper respiratory infection)   2. Asthma exacerbation   3. Fever      Post Duoneb Much subjective improvement. Auscultation reveals no further wheezes and normal expiratory phase. Continued medications. Appears to be maxed out on asthma medications. He switched from Zyrtec Allegra to see if that helps with drainage Sterapred DS pack for one week, take with food Z-Pak Continue your inhalers and other asthma medications Drink plenty of fluids stay well hydrated      Janne Napoleon, NP 07/11/13 1120

## 2013-07-11 NOTE — Discharge Instructions (Signed)
Asthma, Acute Bronchospasm °Acute bronchospasm caused by asthma is also referred to as an asthma attack. Bronchospasm means your air passages become narrowed. The narrowing is caused by inflammation and tightening of the muscles in the air tubes (bronchi) in your lungs. This can make it hard to breath or cause you to wheeze and cough. °CAUSES °Possible triggers are: °· Animal dander from the skin, hair, or feathers of animals. °· Dust mites contained in house dust. °· Cockroaches. °· Pollen from trees or grass. °· Mold. °· Cigarette or tobacco smoke. °· Air pollutants such as dust, household cleaners, hair sprays, aerosol sprays, paint fumes, strong chemicals, or strong odors. °· Cold air or weather changes. Cold air may trigger inflammation. Winds increase molds and pollens in the air. °· Strong emotions such as crying or laughing hard. °· Stress. °· Certain medicines such as aspirin or beta-blockers. °· Sulfites in foods and drinks, such as dried fruits and wine. °· Infections or inflammatory conditions, such as a flu, cold, or inflammation of the nasal membranes (rhinitis). °· Gastroesophageal reflux disease (GERD). GERD is a condition where stomach acid backs up into your throat (esophagus). °· Exercise or strenuous activity. °SIGNS AND SYMPTOMS  °· Wheezing. °· Excessive coughing, particularly at night. °· Chest tightness. °· Shortness of breath. °DIAGNOSIS  °Your health care provider will ask you about your medical history and perform a physical exam. A chest X-ray or blood testing may be performed to look for other causes of your symptoms or other conditions that may have triggered your asthma attack.  °TREATMENT  °Treatment is aimed at reducing inflammation and opening up the airways in your lungs.  Most asthma attacks are treated with inhaled medicines. These include quick relief or rescue medicines (such as bronchodilators) and controller medicines (such as inhaled corticosteroids). These medicines are  sometimes given through an inhaler or a nebulizer. Systemic steroid medicine taken by mouth or given through an IV tube also can be used to reduce the inflammation when an attack is moderate or severe. Antibiotic medicines are only used if a bacterial infection is present.  °HOME CARE INSTRUCTIONS  °· Rest. °· Drink plenty of liquids. This helps the mucus to remain thin and be easily coughed up. Only use caffeine in moderation and do not use alcohol until you have recovered from your illness. °· Do not smoke. Avoid being exposed to secondhand smoke. °· You play a critical role in keeping yourself in good health. Avoid exposure to things that cause you to wheeze or to have breathing problems. °· Keep your medicines up to date and available. Carefully follow your health care provider's treatment plan. °· Take your medicine exactly as prescribed. °· When pollen or pollution is bad, keep windows closed and use an air conditioner or go to places with air conditioning. °· Asthma requires careful medical care. See your health care provider for a follow-up as advised. If you are more than [redacted] weeks pregnant and you were prescribed any new medicines, let your obstetrician know about the visit and how you are doing. Follow-up with your health care provider as directed. °· After you have recovered from your asthma attack, make an appointment with your outpatient doctor to talk about ways to reduce the likelihood of future attacks. If you do not have a doctor who manages your asthma, make an appointment with a primary care doctor to discuss your asthma. °SEEK IMMEDIATE MEDICAL CARE IF:  °· You are getting worse. °· You have trouble breathing. If severe, call   your local emergency services (911 in the [place]  You develop chest pain or discomfort.  You are vomiting.  You are not able to keep fluids down.  You are coughing up yellow, green, brown, or bloody sputum.  You have a fever and your symptoms suddenly get  worse.  You have trouble swallowing. MAKE SURE YOU:   Understand these instructions.  Will watch your condition.  Will get help right away if you are not doing well or get worse. Document Released: 08/14/2006 Document Revised: 12/30/2012 Document Reviewed: 11/04/2012 [facility] Patient Information 2014 [place], [place].  Bronchospasm, Adult A bronchospasm is when the tubes that carry air in and out of your lungs (airwarys) spasm or tighten. During a bronchospasm it is hard to breathe. This is because the airways get smaller. A bronchospasm can be triggered by:  Allergies. These may be to animals, pollen, food, or mold.  Infection. This is a common cause of bronchospasm.  Exercise.  Irritants. These include pollution, cigarette smoke, strong odors, aerosol sprays, and paint fumes.  Weather changes.  Stress.  Being emotional. HOME CARE   Always have a plan for getting help. Know when to call your doctor and local emergency services (911 in the [place] Know where you can get emergency care.  Only take medicines as told by your doctor.  If you were prescribed an inhaler or nebulizer machine, ask your doctor how to use it correctly. Always use a spacer with your inhaler if you were given one.  Stay calm during an attack. Try to relax and breathe more slowly.  Control your home environment:  Change your heating and air conditioning filter at least once a month.  Limit your use of fireplaces and wood stoves.  Do not  smoke. Do not  allow smoking in your home.  Avoid perfumes and fragrances.  Get rid of pests (such as roaches and mice) and their droppings.  Throw away plants if you see mold on them.  Keep your house clean and dust free.  Replace carpet with wood, tile, or vinyl flooring. Carpet can trap dander and dust.  Use allergy-proof pillows, mattress covers, and box spring covers.  Wash bed sheets and blankets every week in hot water. Dry them in a dryer.  Use  blankets that are made of polyester or cotton.  Wash hands frequently. GET HELP IF:  You have muscle aches.  You have chest pain.  The thick spit you spit or cough up (sputum) changes from clear or white to yellow, green, gray, or bloody.  The thick spit you spit or cough up gets thicker.  There are problems that may be related to the medicine you are given such as:  A rash.  Itching.  Swelling.  Trouble breathing. GET HELP RIGHT AWAY IF:  You feel you cannot breathe or catch your breath.  You cannot stop coughing.  Your treatment is not helping you breathe better. MAKE SURE YOU:   Understand these instructions.  Will watch your condition.  Will get help right away if you are not doing well or get worse. Document Released: 02/24/2009 Document Revised: 12/30/2012 Document Reviewed: 10/20/2012 [facility] Patient Information 2014 [place], [place].  Upper Respiratory Infection, Adult An upper respiratory infection (URI) is also sometimes known as the common cold. The upper respiratory tract includes the nose, sinuses, throat, trachea, and bronchi. Bronchi are the airways leading to the lungs. Most people improve within 1 week, but symptoms can last up to 2 weeks. A residual cough  may last even longer.  CAUSES Many different viruses can infect the tissues lining the upper respiratory tract. The tissues become irritated and inflamed and often become very moist. Mucus production is also common. A cold is contagious. You can easily spread the virus to others by oral contact. This includes kissing, sharing a glass, coughing, or sneezing. Touching your mouth or nose and then touching a surface, which is then touched by another person, can also spread the virus. SYMPTOMS  Symptoms typically develop 1 to 3 days after you come in contact with a cold virus. Symptoms vary from person to person. They may include:  Runny nose.  Sneezing.  Nasal congestion.  Sinus irritation.  Sore  throat.  Loss of voice (laryngitis).  Cough.  Fatigue.  Muscle aches.  Loss of appetite.  Headache.  Low-grade fever. DIAGNOSIS  You might diagnose your own cold based on familiar symptoms, since most people get a cold 2 to 3 times a year. Your caregiver can confirm this based on your exam. Most importantly, your caregiver can check that your symptoms are not due to another disease such as strep throat, sinusitis, pneumonia, asthma, or epiglottitis. Blood tests, throat tests, and X-rays are not necessary to diagnose a common cold, but they may sometimes be helpful in excluding other more serious diseases. Your caregiver will decide if any further tests are required. RISKS AND COMPLICATIONS  You may be at risk for a more severe case of the common cold if you smoke cigarettes, have chronic heart disease (such as heart failure) or lung disease (such as asthma), or if you have a weakened immune system. The very young and very old are also at risk for more serious infections. Bacterial sinusitis, middle ear infections, and bacterial pneumonia can complicate the common cold. The common cold can worsen asthma and chronic obstructive pulmonary disease (COPD). Sometimes, these complications can require emergency medical care and may be life-threatening. PREVENTION  The best way to protect against getting a cold is to practice good hygiene. Avoid oral or hand contact with people with cold symptoms. Wash your hands often if contact occurs. There is no clear evidence that vitamin C, vitamin E, echinacea, or exercise reduces the chance of developing a cold. However, it is always recommended to get plenty of rest and practice good nutrition. TREATMENT  Treatment is directed at relieving symptoms. There is no cure. Antibiotics are not effective, because the infection is caused by a virus, not by bacteria. Treatment may include:  Increased fluid intake. Sports drinks offer valuable electrolytes, sugars, and  fluids.  Breathing heated mist or steam (vaporizer or shower).  Eating chicken soup or other clear broths, and maintaining good nutrition.  Getting plenty of rest.  Using gargles or lozenges for comfort.  Controlling fevers with ibuprofen or acetaminophen as directed by your caregiver.  Increasing usage of your inhaler if you have asthma. Zinc gel and zinc lozenges, taken in the first 24 hours of the common cold, can shorten the duration and lessen the severity of symptoms. Pain medicines may help with fever, muscle aches, and throat pain. A variety of non-prescription medicines are available to treat congestion and runny nose. Your caregiver can make recommendations and may suggest nasal or lung inhalers for other symptoms.  HOME CARE INSTRUCTIONS   Only take over-the-counter or prescription medicines for pain, discomfort, or fever as directed by your caregiver.  Use a warm mist humidifier or inhale steam from a shower to increase air moisture. This may  keep secretions moist and make it easier to breathe.  Drink enough water and fluids to keep your urine clear or pale yellow.  Rest as needed.  Return to work when your temperature has returned to normal or as your caregiver advises. You may need to stay home longer to avoid infecting others. You can also use a face mask and careful hand washing to prevent spread of the virus. SEEK MEDICAL CARE IF:   After the first few days, you feel you are getting worse rather than better.  You need your caregiver's advice about medicines to control symptoms.  You develop chills, worsening shortness of breath, or brown or red sputum. These may be signs of pneumonia.  You develop yellow or brown nasal discharge or pain in the face, especially when you bend forward. These may be signs of sinusitis.  You develop a fever, swollen neck glands, pain with swallowing, or white areas in the back of your throat. These may be signs of strep throat. SEEK  IMMEDIATE MEDICAL CARE IF:   You have a fever.  You develop severe or persistent headache, ear pain, sinus pain, or chest pain.  You develop wheezing, a prolonged cough, cough up blood, or have a change in your usual mucus (if you have chronic lung disease).  You develop sore muscles or a stiff neck. Document Released: 10/23/2000 Document Revised: 07/22/2011 Document Reviewed: 08/31/2010 [facility] Patient Information 2014 [place], [place].

## 2013-07-12 NOTE — ED Provider Notes (Signed)
Medical screening examination/treatment/procedure(s) were performed by a resident physician or non-physician practitioner and as the supervising physician I was immediately available for consultation/collaboration.  Zarria Towell, MD    Riata Ikeda S Lasheka Kempner, MD 07/12/13 0744 

## 2013-07-13 ENCOUNTER — Ambulatory Visit (INDEPENDENT_AMBULATORY_CARE_PROVIDER_SITE_OTHER): Payer: Medicare Other | Admitting: Internal Medicine

## 2013-07-13 ENCOUNTER — Encounter: Payer: Self-pay | Admitting: Internal Medicine

## 2013-07-13 VITALS — BP 160/100 | HR 62 | Temp 97.3°F | Wt 206.4 lb

## 2013-07-13 DIAGNOSIS — J209 Acute bronchitis, unspecified: Secondary | ICD-10-CM

## 2013-07-13 LAB — CULTURE, GROUP A STREP

## 2013-07-13 MED ORDER — LORAZEPAM 0.5 MG PO TABS: 0.5000 mg | ORAL_TABLET | Freq: Three times a day (TID) | ORAL | Status: DC | PRN

## 2013-07-13 MED ORDER — DICYCLOMINE HCL 20 MG PO TABS: 10.0000 mg | ORAL_TABLET | Freq: Three times a day (TID) | ORAL | Status: DC

## 2013-07-13 NOTE — Progress Notes (Signed)
Pre visit review using our clinic review tool, if applicable. No additional management support is needed unless otherwise documented below in the visit note. 

## 2013-07-13 NOTE — Patient Instructions (Signed)
Thanks for coming to see me.  I am glad that your respiratory flare is better. Please continue your routine medications  RX: changes made to stay in line with your formulary coverage.   Moving forward I will ask Dr. Posey ReaPlotnikov to assume your care.

## 2013-07-14 NOTE — Progress Notes (Signed)
Subjective:    Patient ID: Dominique Johnston, female    DOB: 1945/06/26, 68 y.o.   MRN: [id number]  HPI Dominique Johnston presents for follow up after being seen Sunday March 1st at Urgent care for a flare of asthmatic bronchitis. Note reviewed. She was treated with duoneb, given a sterapred dose pak and z-pak. She has had a good response and is doing much better.  Need for med change due to formulary changes: alprazolam to lorazepam, bentyl 10 mg caps to 20 mg tabs  PMH, FamHx and SocHx reviewed for any changes and relevance. Current Outpatient Prescriptions on File Prior to Visit  Medication Sig Dispense Refill  . albuterol (PROVENTIL HFA;VENTOLIN HFA) 108 (90 BASE) MCG/ACT inhaler Inhale 2 puffs into the lungs every 6 (six) hours as needed.  18 g  1  . Ascorbic Acid (VITAMIN C PO) Take 1 tablet by mouth daily.        Marland Kitchen azithromycin (ZITHROMAX) 250 MG tablet Take 1 tablet (250 mg total) by mouth daily. 2 tabs po on day 1, 1 tab po on days 2-5  6 tablet  0  . Cetirizine HCl (ZYRTEC PO) Take by mouth daily.      . clidinium-chlordiazePOXIDE (LIBRAX) 2.5-5 MG per capsule TAKE ONE CAPSULE BY MOUTH THREE TIMES DAILY  90 capsule  5  . Cyanocobalamin (B-12 PO) Take by mouth daily.        Marland Kitchen diltiazem (CARDIZEM CD) 120 MG 24 hr capsule TAKE 1 CAPSULE (120 MG TOTAL) BY MOUTH DAILY.  30 capsule  5  . furosemide (LASIX) 40 MG tablet Take 1 tablet (40 mg total) by mouth daily.  30 tablet  5  . guaiFENesin (MUCINEX) 600 MG 12 hr tablet Take 2 tablets (1,200 mg total) by mouth 2 (two) times daily.  60 tablet  5  . guaiFENesin-codeine 100-10 MG/5ML syrup Take 5 mLs by mouth at bedtime as needed for cough.  120 mL  0  . ibuprofen (ADVIL,MOTRIN) 800 MG tablet TAKE ONE TABLET BY MOUTH DAILY AS NEEDED AS DIRECTED  90 tablet  0  . IRON PO Take by mouth daily.        Marland Kitchen KLOR-CON 8 MEQ tablet TAKE ONE TABLET BY MOUTH ONE TIME DAILY  30 tablet  5  . latanoprost (XALATAN) 0.005 % ophthalmic solution Place 1 drop into  both eyes daily.  2.5 mL  11  . levalbuterol (XOPENEX) 1.25 MG/3ML nebulizer solution Take 1.25 mg by nebulization as needed.  72 mL  11  . losartan (COZAAR) 100 MG tablet TAKE 1 TABLET (100 MG TOTAL) BY MOUTH DAILY.  30 tablet  5  . multivitamin (THERAGRAN) tablet Take 1 tablet by mouth daily.        Marland Kitchen omeprazole (PRILOSEC) 40 MG capsule Take 1 capsule (40 mg total) by mouth daily.  30 capsule  5  . PredniSONE 10 MG KIT Take as directed. Take with food.  Disp: 1 taper dosage pack (10 mg for 1 week)  21 each  0  . sertraline (ZOLOFT) 100 MG tablet TAKE ONE AND ONE HALF TABLET DAILY  45 tablet  2  . SUMAtriptan (IMITREX) 100 MG tablet Take 1 tablet (100 mg total) by mouth once. Take 1 tablet at onset of headache  6 tablet  2  . zafirlukast (ACCOLATE) 20 MG tablet Take 1 tablet (20 mg total) by mouth as needed.  30 tablet  0  . [DISCONTINUED] azelastine (ASTELIN) 137 MCG/SPRAY nasal spray by Nasal  route as directed. Use in each nostril as directed       . [DISCONTINUED] diltiazem (DILACOR XR) 120 MG 24 hr capsule Take 1 capsule (120 mg total) by mouth daily.  30 capsule  11  . [DISCONTINUED] levocetirizine (XYZAL) 5 MG tablet Take 5 mg by mouth every evening.         No current facility-administered medications on file prior to visit.       Review of Systems  Constitutional: Negative for fever, chills, activity change and appetite change.  HENT: Negative.   Eyes: Negative.   Respiratory: Positive for cough. Negative for apnea, chest tightness, shortness of breath and wheezing.   Cardiovascular: Negative.   Gastrointestinal: Negative.   Neurological: Negative.          Objective:   Physical Exam Filed Vitals:   07/13/13 1627  BP: 160/100  Pulse: 62  Temp: 97.3 F (36.3 C)   Wt Readings from Last 3 Encounters:  07/13/13 206 lb 6.4 oz (93.622 kg)  12/24/12 211 lb (95.709 kg)  12/10/12 211 lb (95.709 kg)   Gen'l- overweight woman in no distress HEENT- C&S clear Cor -  RRR Pulm - no increased work of breathing. Lungs CTAP       Assessment & Plan:  Acute bronchitic exacerbation of asthma - good response to treatment. Plan Complete her present regimen

## 2013-07-17 ENCOUNTER — Other Ambulatory Visit: Payer: Self-pay | Admitting: Internal Medicine

## 2013-07-20 ENCOUNTER — Other Ambulatory Visit: Payer: Self-pay | Admitting: Internal Medicine

## 2013-07-23 ENCOUNTER — Other Ambulatory Visit: Payer: Self-pay | Admitting: Internal Medicine

## 2013-07-26 ENCOUNTER — Telehealth: Payer: Self-pay

## 2013-07-26 NOTE — Telephone Encounter (Signed)
Prior authorization is required on Lorazepam 0.5 mg. This has been submitted via cover my meds. Waiting for insurance response.

## 2013-07-28 NOTE — Telephone Encounter (Signed)
[facility] a [facility] from [facility] stating PA for Lorazepam has been denied.

## 2013-08-02 ENCOUNTER — Telehealth: Payer: Self-pay

## 2013-08-02 NOTE — Telephone Encounter (Signed)
Phone call from patient [phone] stating her insurance has denied both Alprazolam and Lorazepam. She would like to know what can be prescribed for her anxiety.

## 2013-08-02 NOTE — Telephone Encounter (Signed)
Options would be cash pay or help us with a copy of her formulary coverage for anxiety. She is already taking zoloft.

## 2013-08-03 ENCOUNTER — Telehealth: Payer: Self-pay | Admitting: Internal Medicine

## 2013-08-03 NOTE — Telephone Encounter (Signed)
Patient called and states that her insurance will cover Clorazepate in 3 different dosages 0.375mg , 7.5mg  and 15mg . This drug would just need Prior Approval by calling ph#: [phone].

## 2013-08-03 NOTE — Telephone Encounter (Signed)
Patient has been advised and will contact her insurance. She is still taking the Zoloft daily.

## 2013-08-04 MED ORDER — CLORAZEPATE DIPOTASSIUM 3.75 MG PO TABS: 3.7500 mg | ORAL_TABLET | Freq: Two times a day (BID) | ORAL | Status: DC | PRN

## 2013-08-04 NOTE — Telephone Encounter (Signed)
Ok for chlorazepate 3.75 mg tid, #90. Policy is $29 for prior authorizations.

## 2013-08-27 ENCOUNTER — Other Ambulatory Visit: Payer: Self-pay | Admitting: *Deleted

## 2013-08-27 ENCOUNTER — Telehealth: Payer: Self-pay

## 2013-08-27 MED ORDER — OMEPRAZOLE 40 MG PO CPDR: 40.0000 mg | DELAYED_RELEASE_CAPSULE | Freq: Every day | ORAL | Status: DC

## 2013-08-27 NOTE — Telephone Encounter (Signed)
Patient called [facility] requesting status of PA or medication change to chlorazepate. Per 08/04/13 phone note, Rx called in, pt notified Jacques NavyMichael E Norins, MD at 08/04/2013 8:17 AM     Status: Signed        Ok for chlorazepate 3.75 mg tid, #90. Policy is $29 for prior authorizations.

## 2013-09-01 ENCOUNTER — Telehealth: Payer: Self-pay | Admitting: *Deleted

## 2013-09-01 NOTE — Telephone Encounter (Signed)
I called [phone] and spoke to [employer] representative. A PA form is being faxed.

## 2013-09-01 NOTE — Telephone Encounter (Signed)
Form has been completed and [facility] at [phone].

## 2013-09-01 NOTE — Telephone Encounter (Signed)
Pt called states Clorazepate requires PA.  Further states PA needs to be expedited.  [phone] Pt further states [facility] to be faxing form as well.

## 2013-09-02 ENCOUNTER — Other Ambulatory Visit: Payer: Self-pay | Admitting: *Deleted

## 2013-09-02 NOTE — Telephone Encounter (Signed)
PA for Clorazepate has been approved through 4.22.2016.  Pt has been notified by insurance.

## 2013-09-06 MED ORDER — CLORAZEPATE DIPOTASSIUM 3.75 MG PO TABS: 3.7500 mg | ORAL_TABLET | Freq: Three times a day (TID) | ORAL | Status: DC | PRN

## 2013-09-06 MED ORDER — ALPRAZOLAM 0.5 MG PO TABS
0.5000 mg | ORAL_TABLET | Freq: Three times a day (TID) | ORAL | Status: DC | PRN
Start: ? — End: 2014-03-02

## 2013-09-06 NOTE — Telephone Encounter (Signed)
[facility]

## 2013-09-06 NOTE — Telephone Encounter (Signed)
Ok - note need for screening with [employer] prior to picking up refill (if not already done)  

## 2013-10-18 ENCOUNTER — Other Ambulatory Visit: Payer: Self-pay | Admitting: *Deleted

## 2013-10-18 MED ORDER — FUROSEMIDE 40 MG PO TABS: 40.0000 mg | ORAL_TABLET | Freq: Every day | ORAL | Status: DC

## 2013-10-28 ENCOUNTER — Telehealth: Payer: Self-pay | Admitting: Internal Medicine

## 2013-10-28 MED ORDER — SERTRALINE HCL 100 MG PO TABS: ORAL_TABLET | ORAL | Status: DC

## 2013-10-28 NOTE — Telephone Encounter (Signed)
Pt needs a refill on [facility]ertraline [facility] to [facility] [address].  She has been out for a few days.  She was a pt of Dr. Debby BudNorins and is scheduled for a physical in Oct with Dr. Dorise HissKollar.

## 2013-11-03 ENCOUNTER — Other Ambulatory Visit: Payer: Self-pay | Admitting: *Deleted

## 2013-11-03 MED ORDER — POTASSIUM CHLORIDE ER 8 MEQ PO TBCR: EXTENDED_RELEASE_TABLET | ORAL | Status: DC

## 2013-11-03 MED ORDER — LOSARTAN POTASSIUM 100 MG PO TABS: ORAL_TABLET | ORAL | Status: DC

## 2013-11-03 NOTE — Telephone Encounter (Signed)
Pt stated she has been trying to get her losartan & klor con filled but [facility] stated [facility]'t [facility] response [facility]. Pt have an appt to see Dr. Dorise HissKollar in Oct. [facility]form [facility].../lmb

## 2013-11-17 ENCOUNTER — Other Ambulatory Visit: Payer: Self-pay

## 2013-11-17 MED ORDER — DILTIAZEM HCL ER COATED BEADS 120 MG PO CP24: ORAL_CAPSULE | ORAL | Status: DC

## 2014-01-12 ENCOUNTER — Other Ambulatory Visit: Payer: Self-pay

## 2014-01-22 ENCOUNTER — Other Ambulatory Visit: Payer: Self-pay | Admitting: Internal Medicine

## 2014-03-01 ENCOUNTER — Other Ambulatory Visit: Payer: Self-pay

## 2014-03-01 MED ORDER — OMEPRAZOLE 40 MG PO CPDR: 40.0000 mg | DELAYED_RELEASE_CAPSULE | Freq: Every day | ORAL | Status: DC

## 2014-03-02 ENCOUNTER — Encounter: Payer: Self-pay | Admitting: Internal Medicine

## 2014-03-02 ENCOUNTER — Ambulatory Visit (INDEPENDENT_AMBULATORY_CARE_PROVIDER_SITE_OTHER): Payer: Medicare Other | Admitting: Internal Medicine

## 2014-03-02 ENCOUNTER — Other Ambulatory Visit (INDEPENDENT_AMBULATORY_CARE_PROVIDER_SITE_OTHER): Payer: Medicare Other

## 2014-03-02 VITALS — BP 142/82 | HR 64 | Temp 98.1°F | Resp 16 | Ht 66.0 in | Wt 201.8 lb

## 2014-03-02 DIAGNOSIS — Z0000 Encounter for general adult medical examination without abnormal findings: Secondary | ICD-10-CM

## 2014-03-02 DIAGNOSIS — M8949 Other hypertrophic osteoarthropathy, multiple sites: Secondary | ICD-10-CM

## 2014-03-02 DIAGNOSIS — I10 Essential (primary) hypertension: Secondary | ICD-10-CM

## 2014-03-02 DIAGNOSIS — K589 Irritable bowel syndrome without diarrhea: Secondary | ICD-10-CM

## 2014-03-02 DIAGNOSIS — M150 Primary generalized (osteo)arthritis: Secondary | ICD-10-CM

## 2014-03-02 DIAGNOSIS — M159 Polyosteoarthritis, unspecified: Secondary | ICD-10-CM

## 2014-03-02 DIAGNOSIS — F331 Major depressive disorder, recurrent, moderate: Secondary | ICD-10-CM

## 2014-03-02 DIAGNOSIS — J4520 Mild intermittent asthma, uncomplicated: Secondary | ICD-10-CM

## 2014-03-02 LAB — BASIC METABOLIC PANEL WITH GFR
BUN: 17 mg/dL (ref 6–23)
CO2: 30 meq/L (ref 19–32)
Calcium: 9.3 mg/dL (ref 8.4–10.5)
Chloride: 107 meq/L (ref 96–112)
Creatinine, Ser: 1.2 mg/dL (ref 0.4–1.2)
GFR: 55.29 mL/min — ABNORMAL LOW (ref >60.00)
Glucose, Bld: 90 mg/dL (ref 70–99)
Potassium: 3.8 meq/L (ref 3.5–5.1)
Sodium: 141 meq/L (ref 135–145)

## 2014-03-02 LAB — LIPID PANEL
Cholesterol: 195 mg/dL (ref 0–200)
HDL: 52.2 mg/dL (ref >39.00)
LDL Cholesterol: 125 mg/dL — ABNORMAL HIGH (ref 0–99)
NonHDL: 142.8
Total CHOL/HDL Ratio: 4
Triglycerides: 87 mg/dL (ref 0.0–149.0)
VLDL: 17.4 mg/dL (ref 0.0–40.0)

## 2014-03-02 MED ORDER — IBUPROFEN 800 MG PO TABS: 800.0000 mg | ORAL_TABLET | Freq: Every day | ORAL | Status: DC | PRN

## 2014-03-02 NOTE — Progress Notes (Signed)
Pre visit review using our clinic review tool, if applicable. No additional management support is needed unless otherwise documented below in the visit note. 

## 2014-03-02 NOTE — Assessment & Plan Note (Signed)
She does well with the ibuprofen and takes only when necessary.

## 2014-03-02 NOTE — Progress Notes (Signed)
   Subjective:    Patient ID: Dominique Johnston, female    DOB: 1945-05-18, 68 y.o.   MRN: [id number]  HPI The patient is a [name] who is coming in today to establish care. She has PMH of anxiety, depression, asthma, DJD, IBS. She is doing well with her mood overall although her daughter and 68 YO granddaughter live with her and this causes some stress. She does not use her nerve medicine unless she has to and usually a prescription lasts significantly longer than 1 month. She denies chest pains, SOB, abdominal pain, constipation, diarrhea. Her IBS is well controlled for now. She is having more arthritis since the weather has been cold but she is able to deal with that taking ibuprofen infrequently. She is getting her mammogram later this week.  Review of Systems  Constitutional: Negative for fever, activity change, appetite change, fatigue and unexpected weight change.  HENT: Negative.   Eyes: Negative.   Respiratory: Negative for cough, chest tightness, shortness of breath and wheezing.   Cardiovascular: Negative for chest pain, palpitations and leg swelling.  Gastrointestinal: Negative for abdominal pain, diarrhea, constipation, blood in stool and abdominal distention.  Musculoskeletal: Positive for arthralgias. Negative for back pain, gait problem and myalgias.  Skin: Negative.   Neurological: Negative for dizziness, weakness, light-headedness and headaches.  Psychiatric/Behavioral: Negative for sleep disturbance and dysphoric mood. The patient is nervous/anxious.       Objective:   Physical Exam  Constitutional: She is oriented to person, place, and time. She appears well-developed and well-nourished. No distress.  HENT:  Head: Normocephalic and atraumatic.  Eyes: EOM are normal.  Neck: Normal range of motion.  Cardiovascular: Normal rate and regular rhythm.   Pulmonary/Chest: Effort normal and breath sounds normal. No respiratory distress. She has no wheezes. She has no rales.    Abdominal: Soft. Bowel sounds are normal. She exhibits no distension. There is no tenderness. There is no rebound.  Neurological: She is alert and oriented to person, place, and time. Coordination normal.  Skin: Skin is warm and dry.   Filed Vitals:   03/02/14 0921 03/02/14 1005  BP: 158/92 142/82  Pulse: 64   Temp: 98.1 F (36.7 C)   TempSrc: Oral   Resp: 16   Height: 5\' 6"  (1.676 m)   Weight: 201 lb 12.8 oz (91.536 kg)   SpO2: 99%       Assessment & Plan:

## 2014-03-02 NOTE — Patient Instructions (Signed)
We will see you back in about 6 months to check on your blood pressure.   Call us if you have any problems or questions before then. We are checking your blood work today and will call you with the results.   Stress and Stress Management Stress is a normal reaction to life events. It is what you feel when life demands more than you are used to or more than you can handle. Some stress can be useful. For example, the stress reaction can help you catch the last bus of the day, study for a test, or meet a deadline at work. But stress that occurs too often or for too long can cause problems. It can affect your emotional health and interfere with relationships and normal daily activities. Too much stress can weaken your immune system and increase your risk for physical illness. If you already have a medical problem, stress can make it worse. CAUSES  All sorts of life events may cause stress. An event that causes stress for one person may not be stressful for another person. Major life events commonly cause stress. These may be positive or negative. Examples include losing your job, moving into a new home, getting married, having a baby, or losing a loved one. Less obvious life events may also cause stress, especially if they occur day after day or in combination. Examples include working long hours, driving in traffic, caring for children, being in debt, or being in a difficult relationship. SIGNS AND SYMPTOMS Stress may cause emotional symptoms including, the following:  Anxiety. This is feeling worried, afraid, on edge, overwhelmed, or out of control.  Anger. This is feeling irritated or impatient.  Depression. This is feeling sad, down, helpless, or guilty.  Difficulty focusing, remembering, or making decisions. Stress may cause physical symptoms, including the following:   Aches and pains. These may affect your head, neck, back, stomach, or other areas of your body.  Tight muscles or clenched  jaw.  Low energy or trouble sleeping. Stress may cause unhealthy behaviors, including the following:   Eating to feel better (overeating) or skipping meals.  Sleeping too little, too much, or both.  Working too much or putting off tasks (procrastination).  Smoking, drinking alcohol, or using drugs to feel better. DIAGNOSIS  Stress is diagnosed through an assessment by your health care provider. Your health care provider will ask questions about your symptoms and any stressful life events.Your health care provider will also ask about your medical history and may order blood tests or other tests. Certain medical conditions and medicine can cause physical symptoms similar to stress. Mental illness can cause emotional symptoms and unhealthy behaviors similar to stress. Your health care provider may refer you to a mental health professional for further evaluation.  TREATMENT  Stress management is the recommended treatment for stress.The goals of stress management are reducing stressful life events and coping with stress in healthy ways.  Techniques for reducing stressful life events include the following:  Stress identification. Self-monitor for stress and identify what causes stress for you. These skills may help you to avoid some stressful events.  Time management. Set your priorities, keep a calendar of events, and learn to say "no." These tools can help you avoid making too many commitments. Techniques for coping with stress include the following:  Rethinking the problem. Try to think realistically about stressful events rather than ignoring them or overreacting. Try to find the positives in a stressful situation rather than focusing on the  negatives.  Exercise. Physical exercise can release both physical and emotional tension. The key is to find a form of exercise you enjoy and do it regularly.  Relaxation techniques. These relax the body and mind. Examples include yoga, meditation,  tai chi, biofeedback, deep breathing, progressive muscle relaxation, listening to music, being out in nature, journaling, and other hobbies. Again, the key is to find one or more that you enjoy and can do regularly.  Healthy lifestyle. Eat a balanced diet, get plenty of sleep, and do not smoke. Avoid using alcohol or drugs to relax.  Strong support network. Spend time with family, friends, or other people you enjoy being around.Express your feelings and talk things over with someone you trust. Counseling or talktherapy with a mental health professional may be helpful if you are having difficulty managing stress on your own. Medicine is typically not recommended for the treatment of stress.Talk to your health care provider if you think you need medicine for symptoms of stress. HOME CARE INSTRUCTIONS  Keep all follow-up visits as directed by your health care provider.  Take all medicines as directed by your health care provider. SEEK MEDICAL CARE IF:  Your symptoms get worse or you start having new symptoms.  You feel overwhelmed by your problems and can no longer manage them on your own. SEEK IMMEDIATE MEDICAL CARE IF:  You feel like hurting yourself or someone else. Document Released: 10/23/2000 Document Revised: 09/13/2013 Document Reviewed: 12/22/2012 [facility] Patient Information 2015 [place], [place]. This information is not intended to replace advice given to you by your health care provider. Make sure you discuss any questions you have with your health care provider.

## 2014-03-02 NOTE — Assessment & Plan Note (Signed)
Does well with the zoloft and also using the benzo. Will fill but need to decrease amount given she is not taking that much per month.

## 2014-03-03 NOTE — Assessment & Plan Note (Signed)
She is doing better with these meds but still has some SOB at times. Breathing would likely be improved by some weight loss.

## 2014-03-03 NOTE — Assessment & Plan Note (Signed)
States well controlled with bentyl and omeprazole.

## 2014-03-03 NOTE — Assessment & Plan Note (Signed)
Doing well with diltiazem, lasix, losartan. If BP high in future can change lasix to HCTZ.

## 2014-03-04 ENCOUNTER — Ambulatory Visit
Admission: RE | Admit: 2014-03-04 | Discharge: 2014-03-04 | Disposition: A | Discharge status: Home / Self Care | Payer: Medicare Other | Source: Ambulatory Visit

## 2014-03-04 DIAGNOSIS — Z1231 Encounter for screening mammogram for malignant neoplasm of breast: Secondary | ICD-10-CM

## 2014-03-07 ENCOUNTER — Other Ambulatory Visit: Payer: Self-pay | Admitting: Geriatric Medicine

## 2014-03-07 MED ORDER — SUMATRIPTAN SUCCINATE 100 MG PO TABS: 100.0000 mg | ORAL_TABLET | Freq: Once | ORAL | Status: DC

## 2014-03-29 ENCOUNTER — Other Ambulatory Visit: Payer: Self-pay | Admitting: Geriatric Medicine

## 2014-03-29 MED ORDER — CLORAZEPATE DIPOTASSIUM 3.75 MG PO TABS: 3.7500 mg | ORAL_TABLET | Freq: Three times a day (TID) | ORAL | Status: DC | PRN

## 2014-04-26 ENCOUNTER — Other Ambulatory Visit: Payer: Self-pay | Admitting: Internal Medicine

## 2014-04-29 ENCOUNTER — Telehealth: Payer: Self-pay | Admitting: Internal Medicine

## 2014-04-29 NOTE — Telephone Encounter (Signed)
Pt called in requesting refill.  omeprazole (PRILOSEC) 40 MG capsule

## 2014-04-29 NOTE — Telephone Encounter (Signed)
Notified [facility]

## 2014-05-23 ENCOUNTER — Emergency Department (HOSPITAL_COMMUNITY)
Admission: EM | Admit: 2014-05-23 | Discharge: 2014-05-23 | Disposition: A | Discharge status: Home / Self Care | Payer: Medicare Other | Source: Home / Self Care | Attending: Emergency Medicine | Admitting: Emergency Medicine

## 2014-05-23 ENCOUNTER — Ambulatory Visit (HOSPITAL_COMMUNITY): Payer: Medicare Other | Attending: Emergency Medicine

## 2014-05-23 ENCOUNTER — Encounter (HOSPITAL_COMMUNITY): Payer: Self-pay

## 2014-05-23 DIAGNOSIS — R509 Fever, unspecified: Secondary | ICD-10-CM | POA: Diagnosis not present

## 2014-05-23 DIAGNOSIS — R05 Cough: Secondary | ICD-10-CM | POA: Insufficient documentation

## 2014-05-23 DIAGNOSIS — R059 Cough, unspecified: Secondary | ICD-10-CM

## 2014-05-23 DIAGNOSIS — J189 Pneumonia, unspecified organism: Secondary | ICD-10-CM

## 2014-05-23 MED ORDER — PREDNISONE 20 MG PO TABS: 20.0000 mg | ORAL_TABLET | Freq: Two times a day (BID) | ORAL | Status: DC

## 2014-05-23 MED ORDER — AZITHROMYCIN 250 MG PO TABS: ORAL_TABLET | ORAL | Status: DC

## 2014-05-23 MED ORDER — HYDROCOD POLST-CHLORPHEN POLST 10-8 MG/5ML PO LQCR: 5.0000 mL | Freq: Two times a day (BID) | ORAL | Status: DC | PRN

## 2014-05-23 MED ORDER — CEFTRIAXONE SODIUM 1 G IJ SOLR
1.0000 g | Freq: Once | INTRAMUSCULAR | Status: AC
  Administered 2014-05-23: 1 g via INTRAMUSCULAR

## 2014-05-23 MED ORDER — LIDOCAINE HCL (PF) 1 % IJ SOLN
INTRAMUSCULAR | Status: AC
  Filled 2014-05-23: qty 5

## 2014-05-23 MED ORDER — AMOXICILLIN-POT CLAVULANATE 875-125 MG PO TABS: 1.0000 | ORAL_TABLET | Freq: Two times a day (BID) | ORAL | Status: DC

## 2014-05-23 MED ORDER — CEFTRIAXONE SODIUM 1 G IJ SOLR
INTRAMUSCULAR | Status: AC
  Filled 2014-05-23: qty 10

## 2014-05-23 NOTE — Discharge Instructions (Signed)

## 2014-05-23 NOTE — ED Notes (Signed)
OTF for chest x-ray

## 2014-05-23 NOTE — ED Provider Notes (Signed)
Chief Complaint   Cough   History of Present Illness   Dominique Johnston is a 69 year old female who has had a six-day history of nasal congestion with greenish, bloody drainage, headache, sinus pressure, ear congestion, cough or Dr. Chilton SiGreen, bloody sputum, chest tightness and rattling, chest pain, subjective fever, chills, sweats, sore throat, and nausea. The patient is a 10 year history of asthma. She controls this at home with as needed use of albuterol by nebulizer and MDI inhaler, Astelin nasal spray, and Advair. Symptoms are mild and intermittent. She never been in the hospital or in the emergency room for asthma. No prior history of pneumonia.  Review of Systems   Other than as noted above, the patient denies any of the following symptoms: Systemic:  No fevers, chills, sweats, or myalgias. Eye:  No redness or discharge. ENT:  No ear pain, headache, nasal congestion, drainage, sinus pressure, or sore throat. Neck:  No neck pain, stiffness, or swollen glands. Lungs:  No cough, sputum production, hemoptysis, wheezing, chest tightness, shortness of breath or chest pain. GI:  No abdominal pain, nausea, vomiting or diarrhea.  PMFSH   Past medical history, family history, social history, meds, and allergies were reviewed. She is allergic to enalapril and Septra. She has a history of hypertension, depression, degenerative joint disease, and IBS. Current meds include furosemide, losartan, Prilosec, Zoloft, Imitrex, Accolate, albuterol, vitamin B12, Bentyl, Cardizem, and Advair.  Physical exam   Vital signs:  BP 128/73 mmHg  Pulse 69  Temp(Src) 100.1 F (37.8 C) (Oral)  Resp 16  SpO2 93% General:  Alert and oriented.  In no distress.  Skin warm and dry. Eye:  No conjunctival injection or drainage. Lids were normal. ENT:  TMs and canals were normal, without erythema or inflammation.  Nasal mucosa was clear and uncongested, without drainage.  Mucous membranes were moist.  Pharynx was  clear with no exudate or drainage.  There were no oral ulcerations or lesions. Neck:  Supple, no adenopathy, tenderness or mass. Lungs:  No respiratory distress.  Lungs were clear to auscultation, without wheezes, rales or rhonchi.  Breath sounds were clear and equal bilaterally.  Heart:  Regular rhythm, without gallops, murmers or rubs. Skin:  Clear, warm, and dry, without rash or lesions.   Radiology   Dg Chest 2 View  05/23/2014   CLINICAL DATA:  Persistent cough, fever for 1 week.  EXAM: CHEST  2 VIEW  COMPARISON:  01/18/2012  FINDINGS: Airspace opacity noted in the lingula, new since prior study. This could reflect an area of pneumonia. Right lung is clear. Heart is normal size. Aorta is tortuous. No effusions. No acute bony abnormality.  IMPRESSION: Lingular opacity concerning for pneumonia.   Electronically Signed   By: Charlett NoseKevin  Dover M.D.   On: 05/23/2014 12:40     Course in [facility]gent [facility] Center   The following medications were given:  Medications  cefTRIAXone (ROCEPHIN) injection 1 g (1 g Intramuscular Given 05/23/14 1326)   Assessment     The primary encounter diagnosis was Community acquired pneumonia. A diagnosis of Cough was also pertinent to this visit.  Plan    1.  Meds:  The following meds were prescribed:   Discharge Medication List as of 05/23/2014  1:12 PM    START taking these medications   Details  amoxicillin-clavulanate (AUGMENTIN) 875-125 MG per tablet Take 1 tablet by mouth 2 (two) times daily., Starting 05/23/2014, Until Discontinued, Normal    azithromycin (ZITHROMAX Z-PAK) 250 MG tablet  Take as directed., Normal    chlorpheniramine-HYDROcodone (TUSSIONEX) 10-8 MG/5ML LQCR Take 5 mLs by mouth every 12 (twelve) hours as needed for cough., Starting 05/23/2014, Until Discontinued, Normal    predniSONE (DELTASONE) 20 MG tablet Take 1 tablet (20 mg total) by mouth 2 (two) times daily., Starting 05/23/2014, Until Discontinued, Normal        2.  Patient  Education/Counseling:  The patient was given appropriate handouts, self care instructions, and instructed in symptomatic relief.  Instructed to get extra fluids and extra rest.  Continue her asthma medications at home.  3.  Follow up:  The patient was told to follow up here or with her primary care physician in 48 hours, also suggested repeat chest x-ray in one month, or sooner if becoming worse in any way, and given some red flag symptoms such as increasing fever, difficulty breathing, chest pain, or persistent vomiting which would prompt immediate return.       Reuben Likes, MD 05/23/14 1520

## 2014-05-23 NOTE — ED Notes (Signed)
C/o 'I  get bronchitis every winter when the weather changes" . Reportedly has a lot of secretions and sinus issues

## 2014-05-26 ENCOUNTER — Encounter (HOSPITAL_COMMUNITY): Payer: Self-pay | Admitting: Emergency Medicine

## 2014-05-26 ENCOUNTER — Emergency Department (INDEPENDENT_AMBULATORY_CARE_PROVIDER_SITE_OTHER)
Admission: EM | Admit: 2014-05-26 | Discharge: 2014-05-26 | Disposition: A | Discharge status: Home / Self Care | Payer: Medicare Other | Source: Home / Self Care | Attending: Family Medicine | Admitting: Family Medicine

## 2014-05-26 DIAGNOSIS — J189 Pneumonia, unspecified organism: Secondary | ICD-10-CM

## 2014-05-26 NOTE — ED Notes (Signed)
Pt her for follow of pneumonia from visit 05/23/2014.  States "I feel a lot better".  Still having cough that is productive.  Denies fever or any other symptoms.

## 2014-05-26 NOTE — Discharge Instructions (Signed)
Thank you for coming in today. Return as needed.  Follow up with your doctor.  Call or go to the emergency room if you get worse, have trouble breathing, have chest pains, or palpitations.   Pneumonia Pneumonia is an infection of the lungs.  CAUSES Pneumonia may be caused by bacteria or a virus. Usually, these infections are caused by breathing infectious particles into the lungs (respiratory tract). SIGNS AND SYMPTOMS   Cough.  Fever.  Chest pain.  Increased rate of breathing.  Wheezing.  Mucus production. DIAGNOSIS  If you have the common symptoms of pneumonia, your health care provider will typically confirm the diagnosis with a chest X-ray. The X-ray will show an abnormality in the lung (pulmonary infiltrate) if you have pneumonia. Other tests of your blood, urine, or sputum may be done to find the specific cause of your pneumonia. Your health care provider may also do tests (blood gases or pulse oximetry) to see how well your lungs are working. TREATMENT  Some forms of pneumonia may be spread to other people when you cough or sneeze. You may be asked to wear a mask before and during your exam. Pneumonia that is caused by bacteria is treated with antibiotic medicine. Pneumonia that is caused by the influenza virus may be treated with an antiviral medicine. Most other viral infections must run their course. These infections will not respond to antibiotics.  HOME CARE INSTRUCTIONS   Cough suppressants may be used if you are losing too much rest. However, coughing protects you by clearing your lungs. You should avoid using cough suppressants if you can.  Your health care provider may have prescribed medicine if he or she thinks your pneumonia is caused by bacteria or influenza. Finish your medicine even if you start to feel better.  Your health care provider may also prescribe an expectorant. This loosens the mucus to be coughed up.  Take medicines only as directed by your health  care provider.  Do not smoke. Smoking is a common cause of bronchitis and can contribute to pneumonia. If you are a smoker and continue to smoke, your cough may last several weeks after your pneumonia has cleared.  A cold steam vaporizer or humidifier in your room or home may help loosen mucus.  Coughing is often worse at night. Sleeping in a semi-upright position in a recliner or using a couple pillows under your head will help with this.  Get rest as you feel it is needed. Your body will usually let you know when you need to rest. PREVENTION A pneumococcal shot (vaccine) is available to prevent a common bacterial cause of pneumonia. This is usually suggested for:  People over 401 years old.  Patients on chemotherapy.  People with chronic lung problems, such as bronchitis or emphysema.  People with immune system problems. If you are over 65 or have a high risk condition, you may receive the pneumococcal vaccine if you have not received it before. In some countries, a routine influenza vaccine is also recommended. This vaccine can help prevent some cases of pneumonia.You may be offered the influenza vaccine as part of your care. If you smoke, it is time to quit. You may receive instructions on how to stop smoking. Your health care provider can provide medicines and counseling to help you quit. SEEK MEDICAL CARE IF: You have a fever. SEEK IMMEDIATE MEDICAL CARE IF:   Your illness becomes worse. This is especially true if you are elderly or weakened from any other  disease.  You cannot control your cough with suppressants and are losing sleep.  You begin coughing up blood.  You develop pain which is getting worse or is uncontrolled with medicines.  Any of the symptoms which initially brought you in for treatment are getting worse rather than better.  You develop shortness of breath or chest pain. MAKE SURE YOU:   Understand these instructions.  Will watch your condition.  Will  get help right away if you are not doing well or get worse. Document Released: 04/29/2005 Document Revised: 09/13/2013 Document Reviewed: 07/19/2010 [facility] Patient Information 2015 [place], [place]. This information is not intended to replace advice given to you by your health care provider. Make sure you discuss any questions you have with your health care provider.

## 2014-05-26 NOTE — ED Provider Notes (Signed)
Dominique Johnston is a 69 y.o. female who presents to Urgent Care today for follow-up pneumonia. Patient was seen on January 11 and diagnosed with community-acquired pneumonia plus bronchitis or asthma exacerbation. She was treated with IM ceftriaxone, oral Augmentin and azithromycin as well as prednisone. She feels much better but continues to cough. No fevers or chills vomiting or diarrhea. No shortness of breath.   Past Medical History  Diagnosis Date  . Allergy     rhinitis  . Headache(784.0)   . Hypertension   . Asthma   . Bronchitis     recurrent  . Depression   . DJD (degenerative joint disease) of knee   . DJD (degenerative joint disease), lumbosacral   . Bouchard nodes (DJD hand)   . IBS (irritable bowel syndrome)   . Headache(784.0)   . Hyperlipidemia   . GERD (gastroesophageal reflux disease)   . Sinusitis, chronic    Past Surgical History  Procedure Laterality Date  . Tubal ligation  1977  . Breast lumpectomy  1998    benign; left breast  . Tonsillectomy    . Septoplasty  2000  . Transnasal laryngoscopy  2005  . Orif ankle fracture  '07    Left ankle   History  Substance Use Topics  . Smoking status: Never Smoker   . Smokeless tobacco: Never Used  . Alcohol Use: No   ROS as above Medications: No current facility-administered medications for this encounter.   Current Outpatient Prescriptions  Medication Sig Dispense Refill  . amoxicillin-clavulanate (AUGMENTIN) 875-125 MG per tablet Take 1 tablet by mouth 2 (two) times daily. 20 tablet 0  . clorazepate (TRANXENE) 3.75 MG tablet Take 1 tablet (3.75 mg total) by mouth 3 (three) times daily as needed for anxiety. 90 tablet 3  . Cyanocobalamin (B-12 PO) Take by mouth daily.      Marland Kitchen. diltiazem (CARDIZEM CD) 120 MG 24 hr capsule TAKE 1 CAPSULE (120 MG TOTAL) BY MOUTH DAILY. 30 capsule 5  . fluticasone-salmeterol (ADVAIR HFA) 115-21 MCG/ACT inhaler Inhale 2 puffs into the lungs 2 (two) times daily.    . furosemide  (LASIX) 40 MG tablet TAKE 1 TABLET BY MOUTH EVERY DAY 90 tablet 0  . latanoprost (XALATAN) 0.005 % ophthalmic solution Place 1 drop into both eyes daily. 2.5 mL 11  . levalbuterol (XOPENEX) 1.25 MG/3ML nebulizer solution Take 1.25 mg by nebulization as needed. 72 mL 11  . losartan (COZAAR) 100 MG tablet TAKE 1 TABLET (100 MG TOTAL) BY MOUTH DAILY. 30 tablet 5  . multivitamin (THERAGRAN) tablet Take 1 tablet by mouth daily.      Marland Kitchen. omeprazole (PRILOSEC) 40 MG capsule Take 1 capsule (40 mg total) by mouth daily. 30 capsule 5  . predniSONE (DELTASONE) 20 MG tablet Take 1 tablet (20 mg total) by mouth 2 (two) times daily. 10 tablet 0  . albuterol (PROVENTIL HFA;VENTOLIN HFA) 108 (90 BASE) MCG/ACT inhaler Inhale 2 puffs into the lungs every 6 (six) hours as needed. 18 g 1  . Ascorbic Acid (VITAMIN C PO) Take 1 tablet by mouth daily.      Marland Kitchen. azithromycin (ZITHROMAX Z-PAK) 250 MG tablet Take as directed. 6 tablet 0  . Cetirizine HCl (ZYRTEC PO) Take by mouth daily.    . chlorpheniramine-HYDROcodone (TUSSIONEX) 10-8 MG/5ML LQCR Take 5 mLs by mouth every 12 (twelve) hours as needed for cough. 140 mL 0  . dicyclomine (BENTYL) 20 MG tablet Take 0.5 tablets (10 mg total) by mouth 3 (three) times daily  before meals. 45 tablet 11  . dorzolamide (TRUSOPT) 2 % ophthalmic solution Place into both eyes 2 (two) times daily.     Marland Kitchen guaiFENesin-codeine 100-10 MG/5ML syrup Take 5 mLs by mouth at bedtime as needed for cough. 120 mL 0  . ibuprofen (ADVIL,MOTRIN) 800 MG tablet Take 1 tablet (800 mg total) by mouth daily as needed for moderate pain. 90 tablet 0  . KLOR-CON 8 MEQ tablet TAKE ONE TABLET BY MOUTH ONE TIME DAILY 30 tablet 5  . sertraline (ZOLOFT) 100 MG tablet TAKE 1&1/2 TABLETS DAILY 45 tablet 5  . SUMAtriptan (IMITREX) 100 MG tablet Take 1 tablet (100 mg total) by mouth once. Take 1 tablet at onset of headache 6 tablet 2  . zafirlukast (ACCOLATE) 20 MG tablet Take 1 tablet (20 mg total) by mouth as needed. 30  tablet 0  . [DISCONTINUED] azelastine (ASTELIN) 137 MCG/SPRAY nasal spray by Nasal route as directed. Use in each nostril as directed     . [DISCONTINUED] diltiazem (DILACOR XR) 120 MG 24 hr capsule Take 1 capsule (120 mg total) by mouth daily. 30 capsule 11  . [DISCONTINUED] levocetirizine (XYZAL) 5 MG tablet Take 5 mg by mouth every evening.       Allergies  Allergen Reactions  . Enalapril Maleate     REACTION: swelling, headache  . Sulfamethoxazole-Trimethoprim     Abdominal pain, redness eyes     Exam:  BP 136/93 mmHg  Pulse 72  Temp(Src) 98.2 F (36.8 C) (Oral)  Resp 18  SpO2 98% Gen: Well NAD HEENT: EOMI,  MMM Lungs: Normal work of breathing. Coarse breath sounds bilaterally Heart: RRR no MRG Abd: NABS, Soft. Nondistended, Nontender Exts: Brisk capillary refill, warm and well perfused.   No results found for this or any previous visit (from the past 24 hour(s)). No results found.  Assessment and Plan: 69 y.o. female with pneumonia improving. Oxygen saturation is much better as is fever. Plan to comlpete treatment and follow-up with primary care provider for repeat chest x-ray in about a month.  Discussed warning signs or symptoms. Please see discharge instructions. Patient expresses understanding.     Rodolph Bong, MD 05/26/14 [phone]

## 2014-05-27 ENCOUNTER — Other Ambulatory Visit: Payer: Self-pay | Admitting: Internal Medicine

## 2014-05-29 ENCOUNTER — Other Ambulatory Visit: Payer: Self-pay | Admitting: Internal Medicine

## 2014-05-30 ENCOUNTER — Telehealth: Payer: Self-pay | Admitting: Internal Medicine

## 2014-06-02 MED ORDER — FUROSEMIDE 40 MG PO TABS: 40.0000 mg | ORAL_TABLET | Freq: Every day | ORAL | Status: DC

## 2014-06-02 NOTE — Telephone Encounter (Signed)
Done

## 2014-06-02 NOTE — Telephone Encounter (Signed)
Pt also request refill for furosemide (LASIX) 40 MG tablet. Please advise, last ov with Kollar on 03/02/14.

## 2014-06-23 ENCOUNTER — Ambulatory Visit (INDEPENDENT_AMBULATORY_CARE_PROVIDER_SITE_OTHER)
Admission: RE | Admit: 2014-06-23 | Discharge: 2014-06-23 | Disposition: A | Discharge status: Home / Self Care | Payer: Medicare Other | Source: Ambulatory Visit | Attending: Internal Medicine | Admitting: Internal Medicine

## 2014-06-23 ENCOUNTER — Encounter: Payer: Self-pay | Admitting: Internal Medicine

## 2014-06-23 ENCOUNTER — Ambulatory Visit (INDEPENDENT_AMBULATORY_CARE_PROVIDER_SITE_OTHER): Payer: Medicare Other | Admitting: Internal Medicine

## 2014-06-23 VITALS — BP 158/88 | HR 71 | Temp 98.1°F | Resp 14 | Ht 66.0 in | Wt 199.0 lb

## 2014-06-23 DIAGNOSIS — J3089 Other allergic rhinitis: Secondary | ICD-10-CM

## 2014-06-23 DIAGNOSIS — J189 Pneumonia, unspecified organism: Secondary | ICD-10-CM

## 2014-06-23 NOTE — Patient Instructions (Signed)
We will check the x-ray on you today to make sure that it is better.   Work on using the saline rinses for the sinuses to keep some of the drainage from going into the lungs. If you are not better in 1-2 weeks with the sinuses call us and we will call in some antibiotics for you.  Allergic Rhinitis Allergic rhinitis is when the mucous membranes in the nose respond to allergens. Allergens are particles in the air that cause your body to have an allergic reaction. This causes you to release allergic antibodies. Through a chain of events, these eventually cause you to release histamine into the blood stream. Although meant to protect the body, it is this release of histamine that causes your discomfort, such as frequent sneezing, congestion, and an itchy, runny nose.  CAUSES  Seasonal allergic rhinitis (hay fever) is caused by pollen allergens that may come from grasses, trees, and weeds. Year-round allergic rhinitis (perennial allergic rhinitis) is caused by allergens such as house dust mites, pet dander, and mold spores.  SYMPTOMS   Nasal stuffiness (congestion).  Itchy, runny nose with sneezing and tearing of the eyes. DIAGNOSIS  Your health care provider can help you determine the allergen or allergens that trigger your symptoms. If you and your health care provider are unable to determine the allergen, skin or blood testing may be used. TREATMENT  Allergic rhinitis does not have a cure, but it can be controlled by:  Medicines and allergy shots (immunotherapy).  Avoiding the allergen. Hay fever may often be treated with antihistamines in pill or nasal spray forms. Antihistamines block the effects of histamine. There are over-the-counter medicines that may help with nasal congestion and swelling around the eyes. Check with your health care provider before taking or giving this medicine.  If avoiding the allergen or the medicine prescribed do not work, there are many new medicines your health  care provider can prescribe. Stronger medicine may be used if initial measures are ineffective. Desensitizing injections can be used if medicine and avoidance does not work. Desensitization is when a patient is given ongoing shots until the body becomes less sensitive to the allergen. Make sure you follow up with your health care provider if problems continue. HOME CARE INSTRUCTIONS It is not possible to completely avoid allergens, but you can reduce your symptoms by taking steps to limit your exposure to them. It helps to know exactly what you are allergic to so that you can avoid your specific triggers. SEEK MEDICAL CARE IF:   You have a fever.  You develop a cough that does not stop easily (persistent).  You have shortness of breath.  You start wheezing.  Symptoms interfere with normal daily activities. Document Released: 01/22/2001 Document Revised: 05/04/2013 Document Reviewed: 01/04/2013 [facility] Patient Information 2015 [place], [place]. This information is not intended to replace advice given to you by your health care provider. Make sure you discuss any questions you have with your health care provider.

## 2014-06-23 NOTE — Progress Notes (Signed)
Pre visit review using our clinic review tool, if applicable. No additional management support is needed unless otherwise documented below in the visit note. 

## 2014-06-24 ENCOUNTER — Encounter: Payer: Self-pay | Admitting: Internal Medicine

## 2014-06-24 DIAGNOSIS — R053 Chronic cough: Secondary | ICD-10-CM | POA: Insufficient documentation

## 2014-06-24 DIAGNOSIS — R05 Cough: Secondary | ICD-10-CM | POA: Insufficient documentation

## 2014-06-24 DIAGNOSIS — R059 Cough, unspecified: Secondary | ICD-10-CM | POA: Insufficient documentation

## 2014-06-24 NOTE — Assessment & Plan Note (Signed)
No antibiotics indicated today, she will get back to her saline rinses and see if that helps to relieve some of the coughing and drainage. If no improvement in 1-2 weeks will call back and antibiotics may be indicated then.

## 2014-06-24 NOTE — Assessment & Plan Note (Signed)
Symptoms are resolved mostly. Still with residual cough that is likely from her sinuses. Check CXR for clearance today.

## 2014-06-24 NOTE — Progress Notes (Signed)
   Subjective:    Patient ID: Dominique Johnston, female    DOB: [birth year], 69 y.o.   MRN: [id number]  HPI The patient is a 69 YO female coming in to follow up on a pneumonia that was treated in January. She did finish all the antibiotics. She got some mild GI upset and was still able to finish the medicines. She stopped having fevers and chills. Her cough is dramatically improved but still with some bouts of coughing. She has used her inhaler at times of coughing. Overall no SOB or wheezing. With mild sinus congestion but denies facial tenderness. Wonders if she needs more antibiotics for her sinuses. She denies ear pain or drainage. Denies sore throat. Coughing is mild now and intermittent. Maybe 2-3 times per day. Worse at night time.   Review of Systems  Constitutional: Negative for fever, activity change, appetite change, fatigue and unexpected weight change.  HENT: Positive for congestion. Negative for ear discharge, ear pain, postnasal drip, rhinorrhea, sinus pressure and sore throat.   Eyes: Negative.   Respiratory: Positive for cough. Negative for chest tightness, shortness of breath and wheezing.   Cardiovascular: Negative for chest pain, palpitations and leg swelling.  Gastrointestinal: Negative for abdominal pain, diarrhea, constipation, blood in stool and abdominal distention.  Musculoskeletal: Negative for myalgias, back pain and gait problem.  Skin: Negative.   Neurological: Negative for dizziness, weakness, light-headedness and headaches.  Psychiatric/Behavioral: Negative for sleep disturbance and dysphoric mood.      Objective:   Physical Exam  Constitutional: She is oriented to person, place, and time. She appears well-developed and well-nourished. No distress.  HENT:  Head: Normocephalic and atraumatic.  Right Ear: External ear normal.  Left Ear: External ear normal.  Oropharynx with mild erythema, nose with crusting and swollen turbinates.   Eyes: EOM are normal.  Neck:  Normal range of motion.  Cardiovascular: Normal rate and regular rhythm.   Pulmonary/Chest: Effort normal and breath sounds normal. No respiratory distress. She has no wheezes. She has no rales.  Abdominal: Soft. Bowel sounds are normal. She exhibits no distension. There is no tenderness. There is no rebound.  Neurological: She is alert and oriented to person, place, and time. Coordination normal.  Skin: Skin is warm and dry.   Filed Vitals:   06/23/14 1039  BP: 158/88  Pulse: 71  Temp: 98.1 F (36.7 C)  TempSrc: Oral  Resp: 14  Height: 5\' 6"  (1.676 m)  Weight: 199 lb (90.266 kg)  SpO2: 97%     Assessment & Plan:

## 2014-06-29 ENCOUNTER — Telehealth: Payer: Self-pay | Admitting: Internal Medicine

## 2014-06-29 MED ORDER — AMOXICILLIN-POT CLAVULANATE 875-125 MG PO TABS: 1.0000 | ORAL_TABLET | Freq: Two times a day (BID) | ORAL | Status: DC

## 2014-06-29 NOTE — Telephone Encounter (Signed)
Pt is still not feeling well.  Cough is worse.  She stated that Dr Dorise HissKollar told her she would call in some meds if she wasn't getting any better.

## 2014-06-29 NOTE — Telephone Encounter (Signed)
Patient will go pick up the prescription from the pharmacy.

## 2014-06-29 NOTE — Telephone Encounter (Signed)
Will send in augmentin. 1 pill BID for 7 days to help clear the sinuses.

## 2014-07-19 ENCOUNTER — Other Ambulatory Visit: Payer: Self-pay | Admitting: Geriatric Medicine

## 2014-07-19 MED ORDER — DICYCLOMINE HCL 20 MG PO TABS: 10.0000 mg | ORAL_TABLET | Freq: Three times a day (TID) | ORAL | Status: DC

## 2014-08-11 ENCOUNTER — Other Ambulatory Visit: Payer: Self-pay | Admitting: Internal Medicine

## 2014-10-11 ENCOUNTER — Other Ambulatory Visit: Payer: Self-pay | Admitting: Internal Medicine

## 2014-10-12 ENCOUNTER — Other Ambulatory Visit: Payer: Self-pay | Admitting: Internal Medicine

## 2014-11-02 ENCOUNTER — Other Ambulatory Visit: Payer: Self-pay | Admitting: Internal Medicine

## 2014-11-07 ENCOUNTER — Other Ambulatory Visit: Payer: Self-pay

## 2014-12-15 ENCOUNTER — Encounter (HOSPITAL_COMMUNITY): Payer: Self-pay | Admitting: Emergency Medicine

## 2014-12-15 ENCOUNTER — Emergency Department (HOSPITAL_COMMUNITY)
Admission: EM | Admit: 2014-12-15 | Discharge: 2014-12-15 | Disposition: A | Discharge status: Home / Self Care | Payer: Medicare Other | Source: Home / Self Care | Attending: Family Medicine | Admitting: Family Medicine

## 2014-12-15 ENCOUNTER — Emergency Department (INDEPENDENT_AMBULATORY_CARE_PROVIDER_SITE_OTHER): Payer: Medicare Other

## 2014-12-15 DIAGNOSIS — R05 Cough: Secondary | ICD-10-CM | POA: Diagnosis not present

## 2014-12-15 DIAGNOSIS — J40 Bronchitis, not specified as acute or chronic: Secondary | ICD-10-CM | POA: Diagnosis not present

## 2014-12-15 DIAGNOSIS — R059 Cough, unspecified: Secondary | ICD-10-CM

## 2014-12-15 MED ORDER — PREDNISONE 20 MG PO TABS: 40.0000 mg | ORAL_TABLET | Freq: Every day | ORAL | Status: DC

## 2014-12-15 NOTE — ED Provider Notes (Signed)
CSN: [id number]     Arrival date & time 12/15/14  1603 History   First MD Initiated Contact with Patient 12/15/14 1729     Chief Complaint  Patient presents with  . Cough   (Consider location/radiation/quality/duration/timing/severity/associated sxs/prior Treatment) HPI 69 yo F with known asthma who presents with several week history of increasing cough productive of yellow sputum.  Seen here in January and diagnosed with pneumonia that time. Had fever and chills.   Currently denies any fever or chills. States she also occasionally has cough productive of white sputum.   Hypertension:   Has known hypertension. Elevated BP is noted today.   No HA, CP, dizziness, shortness of breath, palpitations, or LE swelling.   Takes losartan and diltiazem daily. Has not taken her meds today. BP Readings from Last 3 Encounters:  12/15/14 207/87  06/23/14 158/88  05/26/14 136/93        Past Medical History  Diagnosis Date  . Allergy     rhinitis  . Headache(784.0)   . Hypertension   . Asthma   . Bronchitis     recurrent  . Depression   . DJD (degenerative joint disease) of knee   . DJD (degenerative joint disease), lumbosacral   . Bouchard nodes (DJD hand)   . IBS (irritable bowel syndrome)   . Headache(784.0)   . Hyperlipidemia   . GERD (gastroesophageal reflux disease)   . Sinusitis, chronic    Past Surgical History  Procedure Laterality Date  . Tubal ligation  1977  . Breast lumpectomy  1998    benign; left breast  . Tonsillectomy    . Septoplasty  2000  . Transnasal laryngoscopy  2005  . Orif ankle fracture  '07    Left ankle   Family History  Problem Relation Age of Onset  . Hypertension Father   . Coronary artery disease Father     MI/ Fatal  . Breast cancer Neg Hx   . Diabetes Neg Hx   . Colon cancer Neg Hx    History  Substance Use Topics  . Smoking status: Never Smoker   . Smokeless tobacco: Never Used  . Alcohol Use: No   OB History    No data available      Review of Systems  Allergies  Enalapril maleate and Sulfamethoxazole-trimethoprim  Home Medications   Prior to Admission medications   Medication Sig Start Date End Date Taking? Authorizing Provider  albuterol (PROVENTIL HFA;VENTOLIN HFA) 108 (90 BASE) MCG/ACT inhaler Inhale 2 puffs into the lungs every 6 (six) hours as needed. 05/15/13   Rodolph Bong, MD  amoxicillin-clavulanate (AUGMENTIN) 875-125 MG per tablet Take 1 tablet by mouth 2 (two) times daily. 06/29/14   Judie Bonus, MD  Ascorbic Acid (VITAMIN C PO) Take 1 tablet by mouth daily.      Historical Provider, MD  Cetirizine HCl (ZYRTEC PO) Take by mouth daily.    Historical Provider, MD  clorazepate (TRANXENE) 3.75 MG tablet Take 1 tablet (3.75 mg total) by mouth 3 (three) times daily as needed for anxiety. 03/29/14   Judie Bonus, MD  Cyanocobalamin (B-12 PO) Take by mouth daily.      Historical Provider, MD  dicyclomine (BENTYL) 20 MG tablet Take 0.5 tablets (10 mg total) by mouth 3 (three) times daily before meals. 07/19/14   Judie Bonus, MD  diltiazem (CARDIZEM CD) 120 MG 24 hr capsule TAKE 1 CAPSULE (120 MG TOTAL) BY MOUTH DAILY. 06/02/14   Austin Miles  Kollar, MD  dorzolamide (TRUSOPT) 2 % ophthalmic solution Place into both eyes 2 (two) times daily.  01/13/14   Historical Provider, MD  fluticasone-salmeterol (ADVAIR HFA) 115-21 MCG/ACT inhaler Inhale 2 puffs into the lungs 2 (two) times daily.    Historical Provider, MD  furosemide (LASIX) 40 MG tablet Take 1 tablet (40 mg total) by mouth daily. 06/02/14   Judie Bonus, MD  ibuprofen (ADVIL,MOTRIN) 800 MG tablet Take 1 tablet (800 mg total) by mouth daily as needed for moderate pain. 03/02/14   Judie Bonus, MD  KLOR-CON 8 MEQ tablet TAKE ONE TABLET BY MOUTH ONE TIME DAILY 10/11/14   Judie Bonus, MD  KLOR-CON 8 MEQ tablet TAKE ONE TABLET BY MOUTH ONE TIME DAILY 10/12/14   Judie Bonus, MD  KLOR-CON 8 MEQ tablet TAKE ONE TABLET BY MOUTH ONE  TIME DAILY 11/02/14   Judie Bonus, MD  latanoprost (XALATAN) 0.005 % ophthalmic solution Place 1 drop into both eyes daily. 10/22/12   Jacques Navy, MD  levalbuterol Pauline Aus) 1.25 MG/3ML nebulizer solution Take 1.25 mg by nebulization as needed. 10/22/12   Jacques Navy, MD  losartan (COZAAR) 100 MG tablet TAKE 1 TABLET (100 MG TOTAL) BY MOUTH DAILY. 10/11/14   Judie Bonus, MD  losartan (COZAAR) 100 MG tablet TAKE 1 TABLET (100 MG TOTAL) BY MOUTH DAILY. 10/12/14   Judie Bonus, MD  multivitamin [facility]) tablet Take 1 tablet by mouth daily.      Historical Provider, MD  omeprazole (PRILOSEC) 40 MG capsule TAKE 1 CAPSULE (40 MG TOTAL) BY MOUTH DAILY. 08/12/14   Judie Bonus, MD  sertraline (ZOLOFT) 100 MG tablet TAKE 1&1/2 TABLETS DAILY 10/11/14   Judie Bonus, MD  sertraline (ZOLOFT) 100 MG tablet TAKE 1&1/2 TABLETS DAILY 10/12/14   Judie Bonus, MD  SUMAtriptan (IMITREX) 100 MG tablet Take 1 tablet (100 mg total) by mouth once. Take 1 tablet at onset of headache 03/07/14   Judie Bonus, MD  zafirlukast (ACCOLATE) 20 MG tablet Take 1 tablet (20 mg total) by mouth as needed. 10/22/12   Jacques Navy, MD   BP 207/87 mmHg  Pulse 68  Temp(Src) 97.9 F (36.6 C) (Oral)  Resp 16  SpO2 98% Physical Exam  Gen:  Alert, cooperative patient who appears stated age in no acute distress.  Vital signs reviewed. HEENT:  [place]/AT.  EOMI, PERRL.  MMM, tonsils non-erythematous, non-edematous.  External ears WNL, Bilateral TM's normal without retraction, redness or bulging.  Neck: No masses or thyromegaly or limitation in range of motion.  No cervical lymphadenopathy. Cardiac:  Regular rate and rhythm without murmur auscultated.  Good S1/S2. Lungs:   Scattered wheezes noted bilateral bases. Otherwise fairly good air movement.  Extremities: No lower extremity edema.  ED Course  Procedures (including critical care time) Labs Review Labs Reviewed - No data to  display  Imaging Review Dg Chest 2 View  12/15/2014   CLINICAL DATA:  Cough.  Previous diagnosis of pneumonia  EXAM: CHEST  2 VIEW  COMPARISON:  06/23/2014  FINDINGS: Normal heart size and stable mild aortic tortuosity.  Minimal density at the apex of the heart is stable over multiple months and linear, likely scarring. No suspected pneumonia. No edema, effusion, or pneumothorax.  IMPRESSION: No active cardiopulmonary disease.   Electronically Signed   By: Marnee Spring M.D.   On: 12/15/2014 17:58     MDM   1. Cough   2. Bronchitis  3.  Elevated blood pressure superimposed on hypertension/hypertensive urgency:   - for her bronchitis  Prednisone  For the next 5 days. I also discussed with her  Increasing use of her albuterol.  -She needs to take her blood pressure medicine as soon as she gets home. She should come back to see [place] or her PCP next to 3 days to have her blood pressure checked to ensure that it is not still in the urgent realm. No red flags. Warning precautions provided.    Tobey Grim, MD 12/15/14 [phone]

## 2014-12-15 NOTE — Discharge Instructions (Signed)
You do have bronchitis.  Take the Prednisone for the next 5 days, 2 pills a day.  I would also use your inhaler every 6 hours or so during the day and before night for the next several days.    Make sure to follow up with your primary care doctor or with [place] later this week to make sure that your blood pressure is better under controlled.  Take your medicine when you get home.   Acute Bronchitis Bronchitis is inflammation of the airways that extend from the windpipe into the lungs (bronchi). The inflammation often causes mucus to develop. This leads to a cough, which is the most common symptom of bronchitis.  In acute bronchitis, the condition usually develops suddenly and goes away over time, usually in a couple weeks. Smoking, allergies, and asthma can make bronchitis worse. Repeated episodes of bronchitis may cause further lung problems.  CAUSES Acute bronchitis is most often caused by the same virus that causes a cold. The virus can spread from person to person (contagious) through coughing, sneezing, and touching contaminated objects. SIGNS AND SYMPTOMS   Cough.   Fever.   Coughing up mucus.   Body aches.   Chest congestion.   Chills.   Shortness of breath.   Sore throat.  DIAGNOSIS  Acute bronchitis is usually diagnosed through a physical exam. Your health care provider will also ask you questions about your medical history. Tests, such as chest X-rays, are sometimes done to rule out other conditions.  TREATMENT  Acute bronchitis usually goes away in a couple weeks. Oftentimes, no medical treatment is necessary. Medicines are sometimes given for relief of fever or cough. Antibiotic medicines are usually not needed but may be prescribed in certain situations. In some cases, an inhaler may be recommended to help reduce shortness of breath and control the cough. A cool mist vaporizer may also be used to help thin bronchial secretions and make it easier to clear the chest.    HOME CARE INSTRUCTIONS  Get plenty of rest.   Drink enough fluids to keep your urine clear or pale yellow (unless you have a medical condition that requires fluid restriction). Increasing fluids may help thin your respiratory secretions (sputum) and reduce chest congestion, and it will prevent dehydration.   Take medicines only as directed by your health care provider.  If you were prescribed an antibiotic medicine, finish it all even if you start to feel better.  Avoid smoking and secondhand smoke. Exposure to cigarette smoke or irritating chemicals will make bronchitis worse. If you are a smoker, consider using nicotine gum or skin patches to help control withdrawal symptoms. Quitting smoking will help your lungs heal faster.   Reduce the chances of another bout of acute bronchitis by washing your hands frequently, avoiding people with cold symptoms, and trying not to touch your hands to your mouth, nose, or eyes.   Keep all follow-up visits as directed by your health care provider.  SEEK MEDICAL CARE IF: Your symptoms do not improve after 1 week of treatment.  SEEK IMMEDIATE MEDICAL CARE IF:  You develop an increased fever or chills.   You have chest pain.   You have severe shortness of breath.  You have bloody sputum.   You develop dehydration.  You faint or repeatedly feel like you are going to pass out.  You develop repeated vomiting.  You develop a severe headache. MAKE SURE YOU:   Understand these instructions.  Will watch your condition.  Will  get help right away if you are not doing well or get worse. Document Released: 06/06/2004 Document Revised: 09/13/2013 Document Reviewed: 10/20/2012 [facility] Patient Information 2015 [place], [place]. This information is not intended to replace advice given to you by your health care provider. Make sure you discuss any questions you have with your health care provider.

## 2014-12-15 NOTE — ED Notes (Signed)
C/o coughing States her chest and back hurts when she breathes Cough is productive Did have pneumonia in January  Cough syrup and inhaler used as tx

## 2014-12-20 ENCOUNTER — Ambulatory Visit: Payer: Medicare Other | Admitting: Internal Medicine

## 2015-01-19 ENCOUNTER — Other Ambulatory Visit: Payer: Self-pay | Admitting: Internal Medicine

## 2015-02-03 ENCOUNTER — Other Ambulatory Visit: Payer: Self-pay

## 2015-02-03 DIAGNOSIS — Z1231 Encounter for screening mammogram for malignant neoplasm of breast: Secondary | ICD-10-CM

## 2015-02-12 ENCOUNTER — Other Ambulatory Visit: Payer: Self-pay | Admitting: Internal Medicine

## 2015-02-23 ENCOUNTER — Telehealth: Payer: Self-pay

## 2015-02-23 NOTE — Telephone Encounter (Signed)
Call to schedule AWV prior to CPE on 11/3 at 10am with Dr. Okey Duprerawford; LVM for return call

## 2015-02-24 NOTE — Telephone Encounter (Signed)
Fup regarding AWV and stated she had pneumonia and some bronchitis since and did have some health questions. Will come in Monday 10/17 at 10am for AWV

## 2015-02-27 ENCOUNTER — Ambulatory Visit (INDEPENDENT_AMBULATORY_CARE_PROVIDER_SITE_OTHER): Payer: Medicare Other

## 2015-02-27 VITALS — BP 136/76 | Ht 66.0 in | Wt 207.0 lb

## 2015-02-27 DIAGNOSIS — Z0000 Encounter for general adult medical examination without abnormal findings: Secondary | ICD-10-CM

## 2015-02-27 NOTE — Patient Instructions (Addendum)
Dominique Johnston , Thank you for taking time to come for your Medicare Wellness Visit. I appreciate your ongoing commitment to your health goals. Please review the following plan we discussed and let me know if I can assist you in the future.   ASK about Advair; [place] at [phone], Monday-Friday, 8:30 am - 5:30 pm ET. (Advair)   Outreach to pastoral department for more assistance with Advanced Directive   Will defer Prevnar until she has CPE next week and this will be over 2 weeks to build immunity to flu  Mammogram is scheduled at the end of the month  Focus is on energy  Keep doing your mental exercises   Break work up into a planned strategy  Will fup with ? Pulmonary doctor for ongoing pulmonary plan     These are the goals we discussed: Goals    . Weight < 200 lb (90.719 kg)     Lose 7 lbs; more movement; having more energy; try water and try tai chi/ Based on pulmonary and may seek guidance from pulmonologist as she feels tried and gets sob       This is a list of the screening recommended for you and due dates:  Health Maintenance  Topic Date Due  . DEXA scan (bone density measurement)  12/23/2010  . Pneumonia vaccines (2 of 2 - PCV13) 04/04/2012  . Flu Shot  12/12/2015  . Mammogram  03/04/2016  . Tetanus Vaccine  07/13/2018  . Colon Cancer Screening  12/25/2022  . Shingles Vaccine  Completed  .  Hepatitis C: One time screening is recommended by Center for Disease Control  (CDC) for  adults born from [birth year] through 1965.   Completed     Bone Densitometry Bone densitometry is an imaging test that uses a special X-ray to measure the amount of calcium and other minerals in your bones (bone density). This test is also known as a bone mineral density test or dual-energy X-ray absorptiometry (DXA). The test can measure bone density at your hip and your spine. It is similar to having a regular X-ray. You may have this test to:  Diagnose a condition that  causes weak or thin bones (osteoporosis).  Predict your risk of a broken bone (fracture).  Determine how well osteoporosis treatment is working. LET [facility] CARE PROVIDER KNOW ABOUT:  Any allergies you have.  All medicines you are taking, including vitamins, herbs, eye drops, creams, and over-the-counter medicines.  Previous problems you or members of your family have had with the use of anesthetics.  Any blood disorders you have.  Previous surgeries you have had.  Medical conditions you have.  Possibility of pregnancy.  Any other medical test you had within the previous 14 days that used contrast material. RISKS AND COMPLICATIONS Generally, this is a safe procedure. However, problems can occur and may include the following:  This test exposes you to a very small amount of radiation.  The risks of radiation exposure may be greater to unborn children. BEFORE THE PROCEDURE  Do not take any calcium supplements for 24 hours before having the test. You can otherwise eat and drink what you usually do.  Take off all metal jewelry, eyeglasses, dental appliances, and any other metal objects. PROCEDURE  You may lie on an exam table. There will be an X-ray generator below you and an imaging device above you.  Other devices, such as boxes or braces, may be used to position your body properly for  the scan.  You will need to lie still while the machine slowly scans your body.  The images will show up on a computer monitor. AFTER THE PROCEDURE You may need more testing at a later time.   This information is not intended to replace advice given to you by your health care provider. Make sure you discuss any questions you have with your health care provider.   Document Released: 05/21/2004 Document Revised: 05/20/2014 Document Reviewed: 10/07/2013 Elsevier Interactive Patient Education 2016 [place] for Type 2 Diabetes Screening is a way to check for type 2  diabetes in people who do not have symptoms of the disease, but who may likely develop diabetes in the future. Diabetes can lead to serious health problems, but finding diabetes early allows for early treatment. DIABETES RISK FACTORS   Family history of diabetes.  Diseases of the pancreas.  Obesity or being overweight.  Certain racial or ethnic groups:  American [place].  Pacific Islander.  Hispanic.  Asian.  African American.  High blood pressure (hypertension).  History of diabetes while pregnant (gestational diabetes).  Delivering a baby that weighed over 9 pounds.  Being inactive.  High cholesterol or triglycerides.  Age, especially over 71 years of age.  Other diseases or conditions.  Diseases of the pancreas.  Cardiovascular disease.  Disorders of the endocrine system.  Certain medicines, such as those that treat high blood cholesterol levels. WHO IS SCREENED Adults  Adults who have no risk factors and no symptoms should be screened starting at age 7. If the screening tests are normal, they should be repeated every 3 years.  Adults who do not have symptoms, but have 1 or more risk factors, should be screened.  Adults who have 2 or more risk factors may be screened every year.  Adults who have an A1c (3 month average of blood glucose) greater than 5.7% or who had an impaired glucose tolerance (IGT) or impaired fasting glucose (IFG) on a previous test should be screened.  Pregnant women who have risk factors should be screened at their first prenatal visit.  Women who have given birth and had gestational diabetes should be screened 6-12 weeks after the child is born. This screening should be repeated every 1-3 years after the first test. Children or Adolescents  Children and adolescents should be screened for type 2 diabetes if they are overweight and have 2 of the following risk factors:  Having a family history of type 2 diabetes.  Being a member of  a high risk race or ethnic group.  Having signs of insulin resistance or conditions associated with insulin resistance.  Having a mother who had gestational diabetes while pregnant with him or her.  Screening should start at age 21 or at the onset of puberty, whichever comes first. This should be repeated every 2 years. SCREENING In a screening, your caregiver may:  Ask questions about your overall health. This will include questions about the health of close family members, too.  Ask about any diabetes-like symptoms you may have.  Perform a physical exam.  Order some tests that may include:  A fasting plasma glucose test. This measures the level of glucose in your blood. It is done after you have had nothing to eat but water (fasted) for 8 hours.  A random blood glucose test. This test is done without the need to fast.  An oral glucose tolerance test. This is a blood test done in 2 parts. First, a blood  sample is taken after you have fasted. Then, another sample is taken after you drink a liquid that contains a lot of sugar.  An A1c test. This test shows how much glucose has been in your blood over the past 2 to 3 months.   This information is not intended to replace advice given to you by your health care provider. Make sure you discuss any questions you have with your health care provider.   Document Released: 02/23/2009 Document Revised: 05/20/2014 Document Reviewed: 12/05/2010 Elsevier Interactive Patient Education 2016 [place] in the Home  Falls can cause injuries. They can happen to people of all ages. There are many things you can do to make your home safe and to help prevent falls.  WHAT CAN I DO ON THE OUTSIDE OF MY HOME?  Regularly fix the edges of walkways and driveways and fix any cracks.  Remove anything that might make you trip as you walk through a door, such as a raised step or threshold.  Trim any bushes or trees on the path to your  home.  Use bright outdoor lighting.  Clear any walking paths of anything that might make someone trip, such as rocks or tools.  Regularly check to see if handrails are loose or broken. Make sure that both sides of any steps have handrails.  Any raised decks and porches should have guardrails on the edges.  Have any leaves, snow, or ice cleared regularly.  Use sand or salt on walking paths during winter.  Clean up any spills in your garage right away. This includes oil or grease spills. WHAT CAN I DO IN THE BATHROOM?   Use night lights.  Install grab bars by the toilet and in the tub and shower. Do not use towel bars as grab bars.  Use non-skid mats or decals in the tub or shower.  If you need to sit down in the shower, use a plastic, non-slip stool.  Keep the floor dry. Clean up any water that spills on the floor as soon as it happens.  Remove soap buildup in the tub or shower regularly.  Attach bath mats securely with double-sided non-slip rug tape.  Do not have throw rugs and other things on the floor that can make you trip. WHAT CAN I DO IN THE BEDROOM?  Use night lights.  Make sure that you have a light by your bed that is easy to reach.  Do not use any sheets or blankets that are too big for your bed. They should not hang down onto the floor.  Have a firm chair that has side arms. You can use this for support while you get dressed.  Do not have throw rugs and other things on the floor that can make you trip. WHAT CAN I DO IN THE KITCHEN?  Clean up any spills right away.  Avoid walking on wet floors.  Keep items that you use a lot in easy-to-reach places.  If you need to reach something above you, use a strong step stool that has a grab bar.  Keep electrical cords out of the way.  Do not use floor polish or wax that makes floors slippery. If you must use wax, use non-skid floor wax.  Do not have throw rugs and other things on the floor that can make you  trip. WHAT CAN I DO WITH MY STAIRS?  Do not leave any items on the stairs.  Make sure that there are handrails on  both sides of the stairs and use them. Fix handrails that are broken or loose. Make sure that handrails are as long as the stairways.  Check any carpeting to make sure that it is firmly attached to the stairs. Fix any carpet that is loose or worn.  Avoid having throw rugs at the top or bottom of the stairs. If you do have throw rugs, attach them to the floor with carpet tape.  Make sure that you have a light switch at the top of the stairs and the bottom of the stairs. If you do not have them, ask someone to add them for you. WHAT ELSE CAN I DO TO HELP PREVENT FALLS?  Wear shoes that:  Do not have high heels.  Have rubber bottoms.  Are comfortable and fit you well.  Are closed at the toe. Do not wear sandals.  If you use a stepladder:  Make sure that it is fully opened. Do not climb a closed stepladder.  Make sure that both sides of the stepladder are locked into place.  Ask someone to hold it for you, if possible.  Clearly mark and make sure that you can see:  Any grab bars or handrails.  First and last steps.  Where the edge of each step is.  Use tools that help you move around (mobility aids) if they are needed. These include:  Canes.  Walkers.  Scooters.  Crutches.  Turn on the lights when you go into a dark area. Replace any light bulbs as soon as they burn out.  Set up your furniture so you have a clear path. Avoid moving your furniture around.  If any of your floors are uneven, fix them.  If there are any pets around you, be aware of where they are.  Review your medicines with your doctor. Some medicines can make you feel dizzy. This can increase your chance of falling. Ask your doctor what other things that you can do to help prevent falls.   This information is not intended to replace advice given to you by your health care provider.  Make sure you discuss any questions you have with your health care provider.   Document Released: 02/23/2009 Document Revised: 09/13/2014 Document Reviewed: 06/03/2014 Elsevier Interactive Patient Education 2016 [place] Maintenance, Female Adopting a healthy lifestyle and getting preventive care can go a long way to promote health and wellness. Talk with your health care provider about what schedule of regular examinations is right for you. This is a good chance for you to check in with your provider about disease prevention and staying healthy. In between checkups, there are plenty of things you can do on your own. Experts have done a lot of research about which lifestyle changes and preventive measures are most likely to keep you healthy. Ask your health care provider for more information. WEIGHT AND DIET  Eat a healthy diet  Be sure to include plenty of vegetables, fruits, low-fat dairy products, and lean protein.  Do not eat a lot of foods high in solid fats, added sugars, or salt.  Get regular exercise. This is one of the most important things you can do for your health.  Most adults should exercise for at least 150 minutes each week. The exercise should increase your heart rate and make you sweat (moderate-intensity exercise).  Most adults should also do strengthening exercises at least twice a week. This is in addition to the moderate-intensity exercise.  Maintain a healthy weight  Body mass index (BMI) is a measurement that can be used to identify possible weight problems. It estimates body fat based on height and weight. Your health care provider can help determine your BMI and help you achieve or maintain a healthy weight.  For females 35 years of age and older:   A BMI below 18.5 is considered underweight.  A BMI of 18.5 to 24.9 is normal.  A BMI of 25 to 29.9 is considered overweight.  A BMI of 30 and above is considered obese.  Watch levels of  cholesterol and blood lipids  You should start having your blood tested for lipids and cholesterol at 69 years of age, then have this test every 5 years.  You may need to have your cholesterol levels checked more often if:  Your lipid or cholesterol levels are high.  You are older than 69 years of age.  You are at high risk for heart disease.  CANCER SCREENING   Lung Cancer  Lung cancer screening is recommended for adults 50-62 years old who are at high risk for lung cancer because of a history of smoking.  A yearly low-dose CT scan of the lungs is recommended for people who:  Currently smoke.  Have quit within the past 15 years.  Have at least a 30-pack-year history of smoking. A pack year is smoking an average of one pack of cigarettes a day for 1 year.  Yearly screening should continue until it has been 15 years since you quit.  Yearly screening should stop if you develop a health problem that would prevent you from having lung cancer treatment.  Breast Cancer  Practice breast self-awareness. This means understanding how your breasts normally appear and feel.  It also means doing regular breast self-exams. Let your health care provider know about any changes, no matter how small.  If you are in your 20s or 30s, you should have a clinical breast exam (CBE) by a health care provider every 1-3 years as part of a regular health exam.  If you are 78 or older, have a CBE every year. Also consider having a breast X-ray (mammogram) every year.  If you have a family history of breast cancer, talk to your health care provider about genetic screening.  If you are at high risk for breast cancer, talk to your health care provider about having an MRI and a mammogram every year.  Breast cancer gene (BRCA) assessment is recommended for women who have family members with BRCA-related cancers. BRCA-related cancers include:  Breast.  Ovarian.  Tubal.  Peritoneal  cancers.  Results of the assessment will determine the need for genetic counseling and BRCA1 and BRCA2 testing. Cervical Cancer Your health care provider may recommend that you be screened regularly for cancer of the pelvic organs (ovaries, uterus, and vagina). This screening involves a pelvic examination, including checking for microscopic changes to the surface of your cervix (Pap test). You may be encouraged to have this screening done every 3 years, beginning at age 63.  For women ages 31-65, health care providers may recommend pelvic exams and Pap testing every 3 years, or they may recommend the Pap and pelvic exam, combined with testing for human papilloma virus (HPV), every 5 years. Some types of HPV increase your risk of cervical cancer. Testing for HPV may also be done on women of any age with unclear Pap test results.  Other health care providers may not recommend any screening for nonpregnant women who are considered  low risk for pelvic cancer and who do not have symptoms. Ask your health care provider if a screening pelvic exam is right for you.  If you have had past treatment for cervical cancer or a condition that could lead to cancer, you need Pap tests and screening for cancer for at least 20 years after your treatment. If Pap tests have been discontinued, your risk factors (such as having a new sexual partner) need to be reassessed to determine if screening should resume. Some women have medical problems that increase the chance of getting cervical cancer. In these cases, your health care provider may recommend more frequent screening and Pap tests. Colorectal Cancer  This type of cancer can be detected and often prevented.  Routine colorectal cancer screening usually begins at 69 years of age and continues through 69 years of age.  Your health care provider may recommend screening at an earlier age if you have risk factors for colon cancer.  Your health care provider may also  recommend using home test kits to check for hidden blood in the stool.  A small camera at the end of a tube can be used to examine your colon directly (sigmoidoscopy or colonoscopy). This is done to check for the earliest forms of colorectal cancer.  Routine screening usually begins at age 6.  Direct examination of the colon should be repeated every 5-10 years through 69 years of age. However, you may need to be screened more often if early forms of precancerous polyps or small growths are found. Skin Cancer  Check your skin from head to toe regularly.  Tell your health care provider about any new moles or changes in moles, especially if there is a change in a mole's shape or color.  Also tell your health care provider if you have a mole that is larger than the size of a pencil eraser.  Always use sunscreen. Apply sunscreen liberally and repeatedly throughout the day.  Protect yourself by wearing long sleeves, pants, a wide-brimmed hat, and sunglasses whenever you are outside. HEART DISEASE, DIABETES, AND HIGH BLOOD PRESSURE   High blood pressure causes heart disease and increases the risk of stroke. High blood pressure is more likely to develop in:  People who have blood pressure in the high end of the normal range (130-139/85-89 mm Hg).  People who are overweight or obese.  People who are African American.  If you are 60-28 years of age, have your blood pressure checked every 3-5 years. If you are 3 years of age or older, have your blood pressure checked every year. You should have your blood pressure measured twice--once when you are at a hospital or clinic, and once when you are not at a hospital or clinic. Record the average of the two measurements. To check your blood pressure when you are not at a hospital or clinic, you can use:  An automated blood pressure machine at a pharmacy.  A home blood pressure monitor.  If you are between 33 years and 30 years old, ask your health  care provider if you should take aspirin to prevent strokes.  Have regular diabetes screenings. This involves taking a blood sample to check your fasting blood sugar level.  If you are at a normal weight and have a low risk for diabetes, have this test once every three years after 69 years of age.  If you are overweight and have a high risk for diabetes, consider being tested at a younger age or  more often. PREVENTING INFECTION  Hepatitis B  If you have a higher risk for hepatitis B, you should be screened for this virus. You are considered at high risk for hepatitis B if:  You were born in a country where hepatitis B is common. Ask your health care provider which countries are considered high risk.  Your parents were born in a high-risk country, and you have not been immunized against hepatitis B (hepatitis B vaccine).  You have HIV or AIDS.  You use needles to inject street drugs.  You live with someone who has hepatitis B.  You have had sex with someone who has hepatitis B.  You get hemodialysis treatment.  You take certain medicines for conditions, including cancer, organ transplantation, and autoimmune conditions. Hepatitis C  Blood testing is recommended for:  Everyone born from [birth year] through 1965.  Anyone with known risk factors for hepatitis C. Sexually transmitted infections (STIs)  You should be screened for sexually transmitted infections (STIs) including gonorrhea and chlamydia if:  You are sexually active and are younger than 69 years of age.  You are older than 69 years of age and your health care provider tells you that you are at risk for this type of infection.  Your sexual activity has changed since you were last screened and you are at an increased risk for chlamydia or gonorrhea. Ask your health care provider if you are at risk.  If you do not have HIV, but are at risk, it may be recommended that you take a prescription medicine daily to prevent HIV  infection. This is called pre-exposure prophylaxis (PrEP). You are considered at risk if:  You are sexually active and do not regularly use condoms or know the HIV status of your partner(s).  You take drugs by injection.  You are sexually active with a partner who has HIV. Talk with your health care provider about whether you are at high risk of being infected with HIV. If you choose to begin PrEP, you should first be tested for HIV. You should then be tested every 3 months for as long as you are taking PrEP.  PREGNANCY   If you are premenopausal and you may become pregnant, ask your health care provider about preconception counseling.  If you may become pregnant, take 400 to 800 micrograms (mcg) of folic acid every day.  If you want to prevent pregnancy, talk to your health care provider about birth control (contraception). OSTEOPOROSIS AND MENOPAUSE   Osteoporosis is a disease in which the bones lose minerals and strength with aging. This can result in serious bone fractures. Your risk for osteoporosis can be identified using a bone density scan.  If you are 40 years of age or older, or if you are at risk for osteoporosis and fractures, ask your health care provider if you should be screened.  Ask your health care provider whether you should take a calcium or vitamin D supplement to lower your risk for osteoporosis.  Menopause may have certain physical symptoms and risks.  Hormone replacement therapy may reduce some of these symptoms and risks. Talk to your health care provider about whether hormone replacement therapy is right for you.  HOME CARE INSTRUCTIONS   Schedule regular health, dental, and eye exams.  Stay current with your immunizations.   Do not use any tobacco products including cigarettes, chewing tobacco, or electronic cigarettes.  If you are pregnant, do not drink alcohol.  If you are breastfeeding, limit how much and  how often you drink alcohol.  Limit  alcohol intake to no more than 1 drink per day for nonpregnant women. One drink equals 12 ounces of beer, 5 ounces of wine, or 1 ounces of hard liquor.  Do not use street drugs.  Do not share needles.  Ask your health care provider for help if you need support or information about quitting drugs.  Tell your health care provider if you often feel depressed.  Tell your health care provider if you have ever been abused or do not feel safe at home.   This information is not intended to replace advice given to you by your health care provider. Make sure you discuss any questions you have with your health care provider.   Document Released: 11/12/2010 Document Revised: 05/20/2014 Document Reviewed: 03/31/2013 Elsevier Interactive Patient Education [employer].

## 2015-02-27 NOTE — Progress Notes (Signed)
Subjective:   Dominique Johnston is a 69 y.o. female who presents for Medicare Annual (Subsequent) preventive examination.  Review of Systems:  HRA assessment completed during visit; Bramer The Patient was informed that this wellness visit is to identify risk and educate on how to reduce risk for increase disease through lifestyle changes.   ROS deferred to CPE exam with physician  Medical issues  Depression; States she has always had some depression but has learned how to cope and deal with it and not let it overwhelm her; managing well at this time.  Asthma: Pneumonia in January; has cough since;  Admits to second hand smoke exposure for many years via spouse although she has never smoked. Does not check peak flow; Feels like she has a residual cough from pneumonia; did go to urgent care and dx with bronchitis. Given antibiotics and steroid and has completed tx. The patient has Advair now but cannot afford any more; will check with allergist for samples. Given the number for pharmaceutical company for assistance;  Will discuss fup with pulmonologist w Dr. Sharlet Salina for baseline lung test and to be sure she has not "dual" lung issues as well as to review medication. Hopes to have more energy and more confidence to exercise.  Does not have a asthma plan but can state she does go to the doctor when ill as well as use nebulizer as needed. Her lung issues seems to be interfering with her exercise. Is thinking of pool and doesn't think the choline would bother her. Also considering Tai chi; Will fup with Prevnar at CPE visit in early November; Just had flu shot one week ago.    Gerds; under good medical management Hyperlipidemia: cho 195; Trig 87; HDL 52; LDL 125  C/o sob with allergies; feels the change of season has been difficult this year. Understands her triggers and the change in weather is one of them;  See allergist currently  One time chest got tight; went to urgent care; did xray; dx  with bronchitis;  Educated regarding chest pain /  Dr. 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  Sleeps well at hs;   Exercise; would like to exercise more; needs her energy; Gets up and gets [place] dressed; Dtr works at home; pick her up in the afternoon.  Moving around during the day; will rest prior to lunch;  Go to the [place]; They have swimming; and other activities; She is on their mailing list. Mentioned Tai chi; has tried and will go back   SAFETY Lives w dtr who had mental illness has 69 yo dtr that she has lived with post retirement and everything is working out well Married x 29 years and spouse left; mother living and is 42; and 2 brothers in the [place] 69 and has 2 grand dtr; 3 months apart and grand son who will be 70   Safety reviewed for the home;  One level; including removal of clutter; clear paths through the home,  eliminating clutter, railing as needed; bathroom safety; community safety; smoke detectors and firearms safety as well as sun protection;  Driving accidents and seatbelt; no accidents;  Nancy Fetter protection  always   Hx of fall with fx ankle; stepped off curve and broke 3 bones and has metal and screws No pain except this year; had scant amount States last year she did not have these aches; no falls this year; Reviewed for Dexa scan and 2012 dexa was normal; will defer to Dr. Doug Sou    Medication review/ zafirulukast will start taking q day; was taking PRN but ordered every day and may help.  Mobilization and Functional losses in the last year; just notes increase in OA pain in right knee and left ankle secondary to her prior fx to  left ankle.   Urinary or fecal incontinence reviewed / leaks with cough   AD and has not completed; has already made her funeral arrangements back home Referred to pastoral dept at [facility] as needed   Counseling: Dexa scan; 08/2009 femur T score 0.6 in 2012;  Colonoscopy; 12/2012 / diverticulitis (high fiber diet)  EKG 09/2010 Mammogram 03/04/2014 There are no findings suspicious for malignancy. It is scheduled for 10/27th;  Hearing: no changes in hearing;  Ophthalmology exam; borderline glaucoma; small cataract in both eyes; ypressure was good; October the 13th Dr. Prudencio Burly at [facility]mology Immunizations Due  Prevnar 13 had flu shot x 1 week ago and will wait one more week before taking the prevnar and will take when she comes back to see. Dr. Doug Sou.  Current Care Team reviewed and updated Dr. Olevia Perches Allergist  Eye doctor  Cardiac Risk Factors include: advanced age (>54men, >27 women);family history of premature cardiovascular disease;hypertension;microalbuminuria     Objective:     Vitals: BP 136/76 mmHg  Ht $R'5\' 6"'DO$  (1.676 m)  Wt 207 lb (93.895 kg)  BMI 33.43 kg/m2  Tobacco History  Smoking status  . Never Smoker   Smokeless tobacco  . Never Used     Counseling given: Not Answered   Past Medical History  Diagnosis Date  . Allergy     rhinitis  . Headache(784.0)   . Hypertension   . Asthma   . Bronchitis     recurrent  . Depression   . DJD (degenerative joint disease) of knee   . DJD (degenerative joint disease), lumbosacral   . Bouchard nodes (DJD hand)   . IBS (irritable bowel syndrome)   . Headache(784.0)   . Hyperlipidemia   . GERD (gastroesophageal reflux disease)   . Sinusitis, chronic    Past Surgical History  Procedure Laterality Date  . Tubal ligation  1977  . Breast lumpectomy  1998    benign; left breast  . Tonsillectomy    . Septoplasty  2000  . Transnasal laryngoscopy  2005  . Orif ankle fracture  '07    Left ankle   Family  History  Problem Relation Age of Onset  . Hypertension Father   . Coronary artery disease Father     MI/ Fatal  . Breast cancer Neg Hx   . Diabetes Neg Hx   . Colon cancer Neg Hx    History  Sexual Activity  . Sexual Activity: Not Currently    Outpatient Encounter Prescriptions as of 02/27/2015  Medication Sig  . albuterol (PROVENTIL HFA;VENTOLIN HFA) 108 (90 BASE) MCG/ACT inhaler Inhale 2 puffs into the lungs every 6 (six) hours as needed.  . Ascorbic Acid (VITAMIN C PO) Take 1 tablet by mouth daily.    . Cetirizine HCl (ZYRTEC PO) Take by mouth daily.  . clorazepate (TRANXENE) 3.75 MG tablet Take 1 tablet (3.75 mg total)  by mouth 3 (three) times daily as needed for anxiety.  . Cyanocobalamin (B-12 PO) Take by mouth daily.    Marland Kitchen dicyclomine (BENTYL) 20 MG tablet Take 0.5 tablets (10 mg total) by mouth 3 (three) times daily before meals.  Marland Kitchen diltiazem (CARDIZEM CD) 120 MG 24 hr capsule TAKE 1 CAPSULE (120 MG TOTAL) BY MOUTH DAILY.  Marland Kitchen dorzolamide (TRUSOPT) 2 % ophthalmic solution Place into both eyes 2 (two) times daily.   . fluticasone-salmeterol (ADVAIR HFA) 115-21 MCG/ACT inhaler Inhale 2 puffs into the lungs 2 (two) times daily.  . furosemide (LASIX) 40 MG tablet TAKE 1 TABLET (40 MG TOTAL) BY MOUTH DAILY.  Marland Kitchen ibuprofen (ADVIL,MOTRIN) 800 MG tablet Take 1 tablet (800 mg total) by mouth daily as needed for moderate pain.  Marland Kitchen KLOR-CON 8 MEQ tablet TAKE ONE TABLET BY MOUTH ONE TIME DAILY  . latanoprost (XALATAN) 0.005 % ophthalmic solution Place 1 drop into both eyes daily.  Marland Kitchen levalbuterol (XOPENEX) 1.25 MG/3ML nebulizer solution Take 1.25 mg by nebulization as needed.  Marland Kitchen losartan (COZAAR) 100 MG tablet TAKE 1 TABLET (100 MG TOTAL) BY MOUTH DAILY.  Marland Kitchen losartan (COZAAR) 100 MG tablet TAKE 1 TABLET (100 MG TOTAL) BY MOUTH DAILY.  . multivitamin (THERAGRAN) tablet Take 1 tablet by mouth daily.    Marland Kitchen omeprazole (PRILOSEC) 40 MG capsule TAKE 1 CAPSULE (40 MG TOTAL) BY MOUTH DAILY.  Marland Kitchen  sertraline (ZOLOFT) 100 MG tablet TAKE 1&1/2 TABLETS DAILY  . zafirlukast (ACCOLATE) 20 MG tablet Take 1 tablet (20 mg total) by mouth as needed.  Marland Kitchen KLOR-CON 8 MEQ tablet TAKE ONE TABLET BY MOUTH ONE TIME DAILY  . KLOR-CON 8 MEQ tablet TAKE ONE TABLET BY MOUTH ONE TIME DAILY  . sertraline (ZOLOFT) 100 MG tablet TAKE 1&1/2 TABLETS DAILY  . SUMAtriptan (IMITREX) 100 MG tablet Take 1 tablet (100 mg total) by mouth once. Take 1 tablet at onset of headache (Patient not taking: Reported on 02/27/2015)  . [DISCONTINUED] amoxicillin-clavulanate (AUGMENTIN) 875-125 MG per tablet Take 1 tablet by mouth 2 (two) times daily. (Patient not taking: Reported on 02/27/2015)  . [DISCONTINUED] predniSONE (DELTASONE) 20 MG tablet Take 2 tablets (40 mg total) by mouth daily with breakfast. X 5 days (Patient not taking: Reported on 02/27/2015)   No facility-administered encounter medications on file as of 02/27/2015.    Activities of Daily Living In your present state of health, do you have any difficulty performing the following activities: 02/27/2015  Hearing? N  Vision? N  Difficulty concentrating or making decisions? Y  Walking or climbing stairs? N  Dressing or bathing? N  Doing errands, shopping? N  Preparing Food and eating ? N  Using the Toilet? N  In the past six months, have you accidently leaked urine? Y  Managing your Medications? N  Managing your Finances? N  Housekeeping or managing your Housekeeping? N    Patient Care Team: Hoyt Koch, MD as PCP - General (Internal Medicine) Mosetta Anis, MD (Allergy) Peri Maris, MD (Obstetrics and Gynecology)    Assessment:     Assessment   Patient presents for yearly preventative medicine examination. Medicare questionnaire screening were completed, i.e. Functional; fall risk; depression, memory loss and hearing reviewed   All immunizations and health maintenance protocols were reviewed with the patient prefers to given her body 2  weeks to build immunity to flu and then will come and take Prevnar during office visit.   Education provided for laboratory screens;  Reviewed lipids   Medication  reconciliation,will start taking zafirlukast daily as she feels she has current allergy issues this fall.  Past medical history, social history, problem list and allergies were reviewed in detail with the patient  Goals were established with regard to weight loss, exercise, and diet considering ind risk; Goals established and will run by Pulmonologist when referred.  End of life planning was discussed.   Exercise Activities and Dietary recommendations Current Exercise Habits:: Home exercise routine, Type of exercise: Other - see comments (active during the day but current cough has slowed hre down), Intensity: Mild  Goals    . Weight < 200 lb (90.719 kg)     Lose 7 lbs; more movement; having more energy; try water and try tai chi/ Based on pulmonary and may seek guidance from pulmonologist as she feels tried and gets sob      Fall Risk Fall Risk  02/27/2015  Falls in the past year? No   Depression Screen PHQ 2/9 Scores 02/27/2015  PHQ - 2 Score 1    States she manages her depression at this time.   Cognitive Testing MMSE - Mini Mental State Exam 02/27/2015  Not completed: (No Data)   Ad8 Score is 0   Immunization History  Administered Date(s) Administered  . Influenza Split 04/05/2011  . Influenza Whole 04/22/2007, 02/23/2008, 02/21/2009, 03/06/2009, 02/28/2010, 01/26/2014  . Influenza-Unspecified 02/21/2015  . Pneumococcal Polysaccharide-23 05/29/2005, 04/05/2011  . Zoster 07/26/2009   Screening Tests Health Maintenance  Topic Date Due  . DEXA SCAN  12/23/2010  . PNA vac Low Risk Adult (2 of 2 - PCV13) 04/04/2012  . INFLUENZA VACCINE  12/12/2015  . MAMMOGRAM  03/04/2016  . TETANUS/TDAP  07/13/2018  . COLONOSCOPY  12/25/2022  . ZOSTAVAX  Completed  . Hepatitis C Screening  Completed      Plan:       Will discuss referral to pulmonologist with Dr. Sharlet Salina; is positive for hx of secondary smoke; hx pneumonia in Jan; Bronchitis since that time; Lacks energy; Is afraid she may have other lung issues other than allergies and would like referral to pulmonology  Will hold prevnar since she took the flu shot x 1 week ago. Will take when she comes in for CPE  C/o of low energy and more aching in right knee 2dary to OA.  Will have mammogram at the end of this month.  Dexa postponed until CPE;   Referred to [employer] pharmaceutical for assistance as she is no longer able to afford this.  Would like to start exercising at the senior center    During the course of the visit the patient was educated and counseled about the following appropriate screening and preventive services:   Vaccines to include Pneumoccal, Influenza, Hepatitis B, Td, Zostavax, HCV/ postpone Prevnar until CPE  Electrocardiogram/ 09/2010  Cardiovascular Disease/ Father; did discuss S/s of chest pain in women  Colorectal cancer screening/ 12/2012 / diverticulitis (high fiber diet)   Bone density screening/ completed 2012  Diabetes screening/ n/a  Glaucoma screening; ongoing  Mammography/PAP scheduled at the end of the month  Nutrition counseling / educated; BP good today Patient Instructions (the written plan) was given to the patient.   Wynetta Fines, RN  02/27/2015

## 2015-02-27 NOTE — Progress Notes (Signed)
Medical screening examination/treatment/procedure(s) were performed by non-physician practitioner and as supervising physician I was immediately available for consultation/collaboration. I agree with above. Elizabeth A Crawford, MD 

## 2015-03-07 ENCOUNTER — Ambulatory Visit
Admission: RE | Admit: 2015-03-07 | Discharge: 2015-03-07 | Disposition: A | Discharge status: Home / Self Care | Payer: Medicare Other | Source: Ambulatory Visit

## 2015-03-07 DIAGNOSIS — Z1231 Encounter for screening mammogram for malignant neoplasm of breast: Secondary | ICD-10-CM

## 2015-03-13 ENCOUNTER — Other Ambulatory Visit: Payer: Self-pay | Admitting: Internal Medicine

## 2015-03-16 ENCOUNTER — Ambulatory Visit (INDEPENDENT_AMBULATORY_CARE_PROVIDER_SITE_OTHER): Payer: Medicare Other | Admitting: Internal Medicine

## 2015-03-16 ENCOUNTER — Other Ambulatory Visit (INDEPENDENT_AMBULATORY_CARE_PROVIDER_SITE_OTHER): Payer: Medicare Other

## 2015-03-16 ENCOUNTER — Encounter: Payer: Self-pay | Admitting: Internal Medicine

## 2015-03-16 VITALS — BP 152/88 | HR 72 | Temp 98.1°F | Resp 16 | Ht 66.0 in | Wt 205.1 lb

## 2015-03-16 DIAGNOSIS — I10 Essential (primary) hypertension: Secondary | ICD-10-CM | POA: Diagnosis not present

## 2015-03-16 DIAGNOSIS — J4530 Mild persistent asthma, uncomplicated: Secondary | ICD-10-CM

## 2015-03-16 DIAGNOSIS — E789 Disorder of lipoprotein metabolism, unspecified: Secondary | ICD-10-CM

## 2015-03-16 DIAGNOSIS — R5383 Other fatigue: Secondary | ICD-10-CM

## 2015-03-16 DIAGNOSIS — Z23 Encounter for immunization: Secondary | ICD-10-CM | POA: Diagnosis not present

## 2015-03-16 LAB — COMPREHENSIVE METABOLIC PANEL
ALT: 15 U/L (ref 0–35)
AST: 20 U/L (ref 0–37)
Albumin: 3.7 g/dL (ref 3.5–5.2)
Alkaline Phosphatase: 75 U/L (ref 39–117)
BUN: 15 mg/dL (ref 6–23)
CO2: 30 mEq/L (ref 19–32)
Calcium: 9.4 mg/dL (ref 8.4–10.5)
Chloride: 106 mEq/L (ref 96–112)
Creatinine, Ser: 0.95 mg/dL (ref 0.40–1.20)
GFR: 74.96 mL/min (ref >60.00)
Glucose, Bld: 93 mg/dL (ref 70–99)
Potassium: 3.9 mEq/L (ref 3.5–5.1)
Sodium: 141 mEq/L (ref 135–145)
Total Bilirubin: 0.4 mg/dL (ref 0.2–1.2)
Total Protein: 7.2 g/dL (ref 6.0–8.3)

## 2015-03-16 LAB — LIPID PANEL
Cholesterol: 191 mg/dL (ref 0–200)
HDL: 55.7 mg/dL (ref >39.00)
LDL Cholesterol: 120 mg/dL — ABNORMAL HIGH (ref 0–99)
NonHDL: 134.9
Total CHOL/HDL Ratio: 3
Triglycerides: 74 mg/dL (ref 0.0–149.0)
VLDL: 14.8 mg/dL (ref 0.0–40.0)

## 2015-03-16 LAB — VITAMIN B12: Vitamin B-12: >1500 pg/mL — ABNORMAL HIGH (ref 211–911)

## 2015-03-16 LAB — CBC
HCT: 39.9 % (ref 36.0–46.0)
Hemoglobin: 12.3 g/dL (ref 12.0–15.0)
MCHC: 31 g/dL (ref 30.0–36.0)
MCV: 72.1 fl — ABNORMAL LOW (ref 78.0–100.0)
Platelets: 234 10*3/uL (ref 150.0–400.0)
RBC: 5.53 Mil/uL — ABNORMAL HIGH (ref 3.87–5.11)
RDW: 16.2 % — ABNORMAL HIGH (ref 11.5–15.5)
WBC: 5.4 10*3/uL (ref 4.0–10.5)

## 2015-03-16 LAB — TSH: TSH: 1.09 u[IU]/mL (ref 0.35–4.50)

## 2015-03-16 NOTE — Assessment & Plan Note (Signed)
BP slightly elevated today and generally well controlled. She does check at home and it is in goal. Continue losartan, lasix, diltiazem for now. Checking labs and adjust as needed.

## 2015-03-16 NOTE — Progress Notes (Signed)
   Subjective:    Patient ID: Dominique Johnston, female    DOB: 08/15/1945, 69 y.o.   MRN: [id number]  HPI The patient is a 69 YO female coming in for follow up of her medical problems including: hypertension (BP above goal today on diltiazem and lasix and losartan, not complicated), her breathing (she feels that since her pneumonia in February she has not felt great, off advair currently due to donut hole and cost is $300, using albuterol and nebulizer more often) and her fatigue (feeling much more tired, has not had vitamin levels and thyroid checked in some time and wants them checked as she feels that they are low now, sleeps well and wakes rested, no energy, denies depression). No other new concerns.   Review of Systems  Constitutional: Negative for fever, activity change, appetite change, fatigue and unexpected weight change.  HENT: Positive for congestion. Negative for ear discharge, ear pain, postnasal drip, rhinorrhea, sinus pressure and sore throat.   Respiratory: Positive for shortness of breath and wheezing. Negative for cough and chest tightness.   Cardiovascular: Negative for chest pain, palpitations and leg swelling.  Gastrointestinal: Negative for abdominal pain, diarrhea, constipation, blood in stool and abdominal distention.  Musculoskeletal: Negative for myalgias, back pain and gait problem.  Skin: Negative.   Neurological: Negative for dizziness, weakness, light-headedness and headaches.  Psychiatric/Behavioral: Negative for sleep disturbance and dysphoric mood.      Objective:   Physical Exam  Constitutional: She is oriented to person, place, and time. She appears well-developed and well-nourished. No distress.  HENT:  Head: Normocephalic and atraumatic.  Right Ear: External ear normal.  Left Ear: External ear normal.  Oropharynx with mild erythema  Eyes: EOM are normal.  Neck: Normal range of motion.  Cardiovascular: Normal rate and regular rhythm.   Pulmonary/Chest:  Effort normal. No respiratory distress.  Some scattered wheezing  Abdominal: Soft. Bowel sounds are normal. She exhibits no distension. There is no tenderness. There is no rebound.  Neurological: She is alert and oriented to person, place, and time. Coordination normal.  Skin: Skin is warm and dry.   Filed Vitals:   03/16/15 1012  BP: 172/98  Pulse: 72  Temp: 98.1 F (36.7 C)  TempSrc: Oral  Resp: 16  Height: 5\' 6"  (1.676 m)  Weight: 205 lb 1.9 oz (93.042 kg)  SpO2: 98%   EKG: rate 59, axis normal, no significant change from prior 2012, no st or t wave changes, mildly short PR    Assessment & Plan:  Prevnar 13 given at visit

## 2015-03-16 NOTE — Progress Notes (Signed)
Pre visit review using our clinic review tool, if applicable. No additional management support is needed unless otherwise documented below in the visit note. 

## 2015-03-16 NOTE — Assessment & Plan Note (Signed)
She has visit with allergy next week and will try to get sample of advair. Likely any inhaler would be expensive as she is in the donut hole. Talked to her about options for switching once she gets her 2017 formulary based on which is covered better. She does wish to see a lung doctor for her breathing since she feels she is worse since her pneumonia last February. Mild wheezing on exam but no true flare. She needs to get back on controller as she is mild persistent asthma without exacerbation.

## 2015-03-16 NOTE — Assessment & Plan Note (Addendum)
Checking thyroid, B12, CBC, CMP to find cause for her fatigue. She may be struggling more the last week with her breathing being worse. Has also gained about 8 pounds since last visit which could explain some extra fatigue.

## 2015-03-16 NOTE — Patient Instructions (Signed)
The EKG is not changed from the last one.   We will have you go down to the lab and we will call you back with the results.   We will send you to the lung specialist to see if they can figure out the lung troubles.   Medicines we could switch you to include: symbicort, anoro.

## 2015-03-17 NOTE — Assessment & Plan Note (Signed)
Checking lipid panel and not on medication at this time.

## 2015-04-04 ENCOUNTER — Encounter: Payer: Self-pay | Admitting: Internal Medicine

## 2015-04-04 ENCOUNTER — Ambulatory Visit (INDEPENDENT_AMBULATORY_CARE_PROVIDER_SITE_OTHER): Payer: Medicare Other | Admitting: Internal Medicine

## 2015-04-04 ENCOUNTER — Other Ambulatory Visit: Payer: Self-pay

## 2015-04-04 VITALS — BP 140/86 | HR 80 | Ht 66.0 in | Wt 207.2 lb

## 2015-04-04 DIAGNOSIS — J4530 Mild persistent asthma, uncomplicated: Secondary | ICD-10-CM

## 2015-04-04 MED ORDER — MOMETASONE FURO-FORMOTEROL FUM 100-5 MCG/ACT IN AERO: 2.0000 | INHALATION_SPRAY | Freq: Two times a day (BID) | RESPIRATORY_TRACT | Status: DC

## 2015-04-04 MED ORDER — MOMETASONE FURO-FORMOTEROL FUM 100-5 MCG/ACT IN AERO: INHALATION_SPRAY | RESPIRATORY_TRACT | Status: DC

## 2015-04-04 MED ORDER — CLORAZEPATE DIPOTASSIUM 3.75 MG PO TABS: 3.7500 mg | ORAL_TABLET | Freq: Three times a day (TID) | ORAL | Status: DC | PRN

## 2015-04-04 NOTE — Patient Instructions (Addendum)
Omeprazole 40 mg Take 30-60 min before first meal of the day and pepcid 20 mg one at bedtime  Stop breo and replace dulera 100 Take 2 puffs first thing in am and then another 2 puffs about 12 hours later.   Work on inhaler technique:  relax and gently blow all the way out then take a nice smooth deep breath back in, triggering the inhaler at same time you start breathing in.  Hold for up to 5 seconds if you can. Blow out thru nose. Rinse and gargle with water when done      GERD (REFLUX)  is an extremely common cause of respiratory symptoms just like yours , many times with no obvious heartburn at all.    It can be treated with medication, but also with lifestyle changes including elevation of the head of your bed (ideally with 6 inch  bed blocks),  Smoking cessation, avoidance of late meals, excessive alcohol, and avoid fatty foods, chocolate, peppermint, colas, red wine, and acidic juices such as orange juice.  NO MINT OR MENTHOL PRODUCTS SO NO COUGH DROPS  USE SUGARLESS CANDY INSTEAD (Jolley ranchers or Stover's or Life Savers) or even ice chips will also do - the key is to swallow to prevent all throat clearing. NO OIL BASED VITAMINS - use powdered substitutes.  mucinex dm 1200 mg every 12 hours as needed for cough   For drainage / throat tickle try take CHLORPHENIRAMINE  4 mg - take one every 4 hours as needed - available over the counter- may cause drowsiness so start with just a bedtime dose or two and see how you tolerate it before trying in daytime    Please schedule a follow up office visit in 2 weeks, sooner if needed - bring inhalers with you

## 2015-04-04 NOTE — Progress Notes (Signed)
Subjective:     Patient ID: Dominique Johnston, female   DOB: Jul 22, 1945,     MRN: [id number]  HPI  76 yowbf never smoker some passive smoke exp previously with dx of asthma in late 40's f/u by Powhatan Callas shots x 6 years stopped around 2010-2011 with re-eval by Paxton Callas 2016 "ok" but developed persistent cough  since Jan 2016 so referred 04/04/2015 by [name] to pulmonary clinic  . 04/04/2015 [facility] office visit/ Jurnie Garritano   Chief Complaint  Patient presents with  . PULMONARY CONSULT    Pt referred by Dr. [name] for asthma: pt states she had pnuemonia in january and since than she hasnt been able to get right. pt states shes also had bronchitis. pt has a concern if her lungs are bad or is it just the asthma. pt states she has be exposed to second hand smoke so shes concerned. pt c/o prod cough pale, and yellowish in color, SOB, wheezing, and some chest tightness.  symptoms occurring 4 /7 days per week since Jan 2016 esp tend to worse in   am/ sometimes while sleeping and really only 100% better  One year prior to OV  Then maint prilosec, advair, zyrtec and prn albuterol > changed to [facility] and doing some better with breathing but not cough   No obvious patterns in day to day or daytime variability or assoc  cp or  subjective wheeze or overt sinus or hb symptoms. No unusual exp hx or h/o childhood pna/ asthma or knowledge of premature birth.  Sleeping ok without nocturnal  or early am exacerbation  of respiratory  c/o's or need for noct saba. Also denies any obvious fluctuation of symptoms with weather or environmental changes or other aggravating or alleviating factors except as outlined above   Current Medications, Allergies, Complete Past Medical History, Past Surgical History, Family History, and Social History were reviewed in [employer] record.  ROS  The following are not active complaints unless bolded sore throat, dysphagia, dental problems, itching, sneezing,   nasal congestion or excess/ purulent secretions, ear ache,   fever, chills, sweats, unintended wt loss, classically pleuritic or exertional cp, hemoptysis,  orthopnea pnd or leg swelling, presyncope, palpitations, abdominal pain, anorexia, nausea, vomiting, diarrhea  or change in bowel or bladder habits, change in stools or urine, dysuria,hematuria,  rash, arthralgias, visual complaints, headache, numbness, weakness or ataxia or problems with walking or coordination,  change in mood/affect or memory.           Review of Systems     Objective:   Physical Exam    amb hoarse bf nad -  Very animated  And pleasant but  failed to answer a single question asked in a straightforward manner, tending to go off on tangents or answer questions with ambiguous medical terms or diagnoses:  eg It's my   "bronchtis"  " Allergies"  "asthma" in various orders s specifying what exactly her symptoms were    Wt Readings from Last 3 Encounters:  04/04/15 207 lb 3.2 oz (93.985 kg)  03/16/15 205 lb 1.9 oz (93.042 kg)  02/27/15 207 lb (93.895 kg)    Vital signs reviewed    HEENT: nl dentition, turbinates, and oropharynx. Nl external ear canals without cough reflex   NECK :  without JVD/Nodes/TM/ nl carotid upstrokes bilaterally   LUNGS: no acc muscle use, clear to A and P bilaterally without cough on insp or exp maneuvers   CV:  RRR  no s3  or murmur or increase in P2, no edema   ABD:  soft and nontender with nl excursion in the supine position. No bruits or organomegaly, bowel sounds nl  MS:  warm without deformities, calf tenderness, cyanosis or clubbing  SKIN: warm and dry without lesions    NEURO:  alert, approp, no deficits      I personally reviewed images and agree with radiology impression as follows:  CXR:  12/15/14  No active cardiopulmonary disease.    Assessment:

## 2015-04-05 NOTE — Assessment & Plan Note (Addendum)
DDX of  difficult airways management all start with A and  include Adherence, Ace Inhibitors, Acid Reflux, Active Sinus Disease, Alpha 1 Antitripsin deficiency, Anxiety masquerading as Airways dz,  ABPA,  allergy(esp in young), Aspiration (esp in elderly), Adverse effects of meds,  Active smokers, A bunch of PE's (a small clot burden can't cause this syndrome unless there is already severe underlying pulm or vascular dz with poor reserve) plus two Bs  = Bronchiectasis and Beta blocker use..and one C= CHF  Adherence is always the initial "prime suspect" and is a multilayered concern that requires a "trust but verify" approach in every patient - starting with knowing how to use medications, especially inhalers, correctly, keeping up with refills and understanding the fundamental difference between maintenance and prns vs those medications only taken for a very short course and then stopped and not refilled.  - The proper method of use, as well as anticipated side effects, of a metered-dose inhaler are discussed and demonstrated to the patient. Improved effectiveness after extensive coaching during this visit to a level of approximately  75% so will try lower dose ics  ? Acid (or non-acid) GERD > always difficult to exclude as up to 75% of pts in some series report no assoc GI/ Heartburn symptoms> rec max (24h)  acid suppression and diet restrictions/ reviewed and instructions given in writing.   ? Adverse effects of dpi > try dulera 100 2bid  ? Allergy > says ruled out already by Dr  CallasSharma  ? Active sinus dx > might do sinus ct next  ? Anxiety > usually at the bottom of this list of usual suspects but should be much higher on this pt's based on H and P and note already on psychotropics .   I had an extended discussion with the patient reviewing all relevant studies completed to date and  lasting 35 minutes of a 60minute visit    Each maintenance medication was reviewed in detail including most  importantly the difference between maintenance and prns and under what circumstances the prns are to be triggered using an action plan format that is not reflected in the computer generated alphabetically organized AVS.    Please see instructions for details which were reviewed in writing and the patient given a copy highlighting the part that I personally wrote and discussed at today's ov.

## 2015-04-05 NOTE — Assessment & Plan Note (Signed)
Complicated by hbp/ borderline hyperlipidemia   Body mass index is 33.46 kg/(m^2).  Lab Results  Component Value Date   TSH 1.09 03/16/2015     Contributing to gerd tendency/ doe/ reviewed the need and the process to achieve and maintain neg calorie balance > defer f/u primary care including intermittently monitoring thyroid status

## 2015-04-11 ENCOUNTER — Other Ambulatory Visit: Payer: Self-pay | Admitting: Internal Medicine

## 2015-04-11 ENCOUNTER — Telehealth: Payer: Self-pay

## 2015-04-11 NOTE — Telephone Encounter (Signed)
[employer] called stating the PA denial for medication. She states the pt will be receiving a letter and if they need to contact them they can be reached at [phone]

## 2015-04-11 NOTE — Telephone Encounter (Signed)
PA initiated via covermymeds. ZOX:[id number]

## 2015-04-18 ENCOUNTER — Ambulatory Visit (INDEPENDENT_AMBULATORY_CARE_PROVIDER_SITE_OTHER): Payer: Medicare Other | Admitting: Internal Medicine

## 2015-04-18 ENCOUNTER — Encounter: Payer: Self-pay | Admitting: Internal Medicine

## 2015-04-18 ENCOUNTER — Telehealth: Payer: Self-pay | Admitting: Internal Medicine

## 2015-04-18 VITALS — BP 140/80 | HR 64 | Ht 66.0 in | Wt 205.2 lb

## 2015-04-18 DIAGNOSIS — J4530 Mild persistent asthma, uncomplicated: Secondary | ICD-10-CM | POA: Diagnosis not present

## 2015-04-18 DIAGNOSIS — R05 Cough: Secondary | ICD-10-CM | POA: Diagnosis not present

## 2015-04-18 DIAGNOSIS — R058 Other specified cough: Secondary | ICD-10-CM

## 2015-04-18 MED ORDER — MOMETASONE FURO-FORMOTEROL FUM 100-5 MCG/ACT IN AERO: INHALATION_SPRAY | RESPIRATORY_TRACT | Status: DC

## 2015-04-18 NOTE — Telephone Encounter (Signed)
Patient said clonazepam was denied, due to the dx  not matching what the drug is used for. She wants to know if the PA was filled out properly. She talked to [employer] insurance company and they said it was not covered under the reason submitted.  She said she takes it for anxiety, and it is usually covered. Please let me know. If we can't get it approved, then Dr. Okey Duprerawford will have to issue a replacement medication.

## 2015-04-18 NOTE — Telephone Encounter (Signed)
Pt called in and had question about the denial of her clonazepam.  She wants to talk with a nurse to see what has to be done in order for her to get this med   Best number [phone]

## 2015-04-18 NOTE — Patient Instructions (Addendum)
Stop zyrtec and  Try  For drainage / throat tickle try take CHLORPHENIRAMINE  4 mg - take one every 4 hours as needed - available over the counter- may cause drowsiness so start with just a bedtime dose or two and see how you tolerate it before trying in daytime    Try dulera 100 one twice daily but Work on inhaler technique:  relax and gently blow all the way out then take a nice smooth deep breath back in, triggering the inhaler at same time you start breathing in.  Hold for up to 5 seconds if you can. Blow out thru nose. Rinse and gargle with water when done   Continue acid suppression and the diet   Stop accolate    If you are satisfied with your treatment plan,  let your doctor know and he/she can either refill your medications or you can return here when your prescription runs out.     If in any way you are not 100% satisfied,  please tell us.  If 100% better, tell your friends!  Pulmonary follow up is as needed

## 2015-04-18 NOTE — Progress Notes (Signed)
Subjective:     Patient ID: Dominique Johnston, female   DOB: 12-02-1945,     MRN: [id number]   Brief patient profile:  [age] yowbf never smoker some passive smoke exp previously with dx of asthma in late 40's f/u by Craig CallasSharma shots x 6 years stopped around 2010-2011 with re-eval by Chancellor CallasSharma 2016 "ok" but developed persistent cough  since Jan 2016 so referred 04/04/2015 by Okey Duprerawford to pulmonary clinic   History of Present Illness  04/04/2015 [facility] office visit/ Loreli Debruler   Chief Complaint  Patient presents with  . PULMONARY CONSULT    Pt referred by Dr. Okey Duprerawford for asthma: pt states she had pnuemonia in january and since than she hasnt been able to get right. pt states shes also had bronchitis. pt has a concern if her lungs are bad or is it just the asthma. pt states she has be exposed to second hand smoke so shes concerned. pt c/o prod cough pale, and yellowish in color, SOB, wheezing, and some chest tightness.  symptoms occurring 4 /7 days per week since Jan 2016 esp tend to worse in   am/ sometimes while sleeping and reallhasn't been 100% better  X one year prior to OV  Then maint prilosec, advair, zyrtec and prn albuterol > [facility] and doing some better with breathing but not cough  rec Omeprazole 40 mg Take 30-60 min before first meal of the day and pepcid 20 mg one at bedtime Stop breo and replace dulera 100 Take 2 puffs first thing in am and then another 2 puffs about 12 hours later.  Work on inhaler technique:   GERD diet  mucinex dm 1200 mg every 12 hours as needed for cough  For drainage / throat tickle try take CHLORPHENIRAMINE  4 mg - take one every 4 hours as needed - available over the counter- may cause drowsiness so start with just a bedtime dose or two and see how you tolerate it before trying in daytime       04/18/2015  f/u ov/[place] [place] re: cough variant asthma vs uacs / never got the rec h1 / maint cc = sense of pnds day >>noct s excess mucus production  Not limited  by breathing from desired activities  / no need for saba    No obvious   day to day or daytime variability or assoc  cp or  subjective wheeze or overt sinus or hb symptoms. No unusual exp hx or h/o childhood pna/ asthma or knowledge of premature birth.  Sleeping ok without nocturnal  or early am exacerbation  of respiratory  c/o's or need for noct saba. Also denies any obvious fluctuation of symptoms with weather or environmental changes or other aggravating or alleviating factors except as outlined above   Current Medications, Allergies, Complete Past Medical History, Past Surgical History, Family History, and Social History were reviewed in [facility] electronic medical record.  ROS  The following are not active complaints unless bolded sore throat, dysphagia, dental problems, itching, sneezing,  nasal congestion or excess/ purulent secretions, ear ache,   fever, chills, sweats, unintended wt loss, classically pleuritic or exertional cp, hemoptysis,  orthopnea pnd or leg swelling, presyncope, palpitations, abdominal pain, anorexia, nausea, vomiting, diarrhea  or change in bowel or bladder habits, change in stools or urine, dysuria,hematuria,  rash, arthralgias, visual complaints, headache, numbness, weakness or ataxia or problems with walking or coordination,  change in mood/affect or memory.  Objective:   Physical Exam    amb hoarse bf nad -  Very animated  And pleasant but  failed to answer a single question asked in a straightforward manner, tending to go off on tangents or answer questions with ambiguous medical terms or diagnoses:  eg It's my   "bronchtis"  " Allergies"  "asthma" in various orders s specifying what exactly her symptoms were    04/18/2015        205   04/04/15 207 lb 3.2 oz (93.985 kg)  03/16/15 205 lb 1.9 oz (93.042 kg)  02/27/15 207 lb (93.895 kg)    Vital signs reviewed    HEENT: nl dentition, turbinates, and oropharynx. Nl external ear canals  without cough reflex   NECK :  without JVD/Nodes/TM/ nl carotid upstrokes bilaterally   LUNGS: no acc muscle use, clear to A and P bilaterally without cough on insp or exp maneuvers   CV:  RRR  no s3 or murmur or increase in P2, no edema   ABD:  soft and nontender with nl excursion in the supine position. No bruits or organomegaly, bowel sounds nl  MS:  warm without deformities, calf tenderness, cyanosis or clubbing  SKIN: warm and dry without lesions    NEURO:  alert, approp, no deficits      I personally reviewed images and agree with radiology impression as follows:  CXR:  12/15/14  No active cardiopulmonary disease.    Assessment:

## 2015-04-19 ENCOUNTER — Telehealth: Payer: Self-pay

## 2015-04-19 ENCOUNTER — Encounter: Payer: Self-pay | Admitting: Internal Medicine

## 2015-04-19 DIAGNOSIS — R058 Other specified cough: Secondary | ICD-10-CM | POA: Insufficient documentation

## 2015-04-19 DIAGNOSIS — R05 Cough: Secondary | ICD-10-CM | POA: Insufficient documentation

## 2015-04-19 NOTE — Telephone Encounter (Signed)
Redone medication prior authorization for patient, however the medication she is on is not clonazepam, she was prescribed Clorazepate. PA initiated via covermymeds. ZOX:[id number]:[id number]

## 2015-04-19 NOTE — Assessment & Plan Note (Signed)
04/04/2015    changed Breo to dulera 100 2bid  > improved 04/18/2015  - 04/18/2015  extensive coaching HFA effectiveness =    75% from a basline of 50%  All goals of chronic asthma control met including optimal function and elimination of symptoms with minimal need for rescue therapy.  Contingencies discussed in full including contacting this office immediately if not controlling the symptoms using the rule of two's.     The risk of using higher doses of ics or dpi ics is that she likely has a component of uacs (see sep a/p)  I had an extended discussion with the patient reviewing all relevant studies completed to date and  lasting 15 to 20 minutes of a 25 minute visit    Each maintenance medication was reviewed in detail including most importantly the difference between maintenance and prns and under what circumstances the prns are to be triggered using an action plan format that is not reflected in the computer generated alphabetically organized AVS.    Please see instructions for details which were reviewed in writing and the patient given a copy highlighting the part that I personally wrote and discussed at today's ov.

## 2015-04-19 NOTE — Assessment & Plan Note (Addendum)
Classic Upper airway cough syndrome, so named because it's frequently impossible to sort out how much is  CR/sinusitis with freq throat clearing (which can be related to primary GERD)   vs  causing  secondary (" extra esophageal")  GERD from wide swings in gastric pressure that occur with throat clearing, often  promoting self use of mint and menthol lozenges that reduce the lower esophageal sphincter tone and exacerbate the problem further in a cyclical fashion.   These are the same pts (now being labeled as having "irritable larynx syndrome" by some cough centers) who not infrequently have a history of having failed to tolerate ace inhibitors,  dry powder inhalers or biphosphonates or report having atypical reflux symptoms that don't respond to standard doses of PPI , and are easily confused as having aecopd or asthma flares by even experienced allergists/ pulmonologists.   Next needs to continue max rx directed at gerd and add  trial of 1st gen h1 if tolerates/ f/u allergy and ? ent but pulmonary f/u prn

## 2015-04-20 NOTE — Telephone Encounter (Signed)
Patient would need visit to discuss changes.

## 2015-04-20 NOTE — Telephone Encounter (Signed)
Pharmacy called and is wanting to know if maybe there was an alternative for the clorazepate (TRANXENE) 3.75 MG tablet [[id number] The [facility] [address] [facility] [address]

## 2015-04-20 NOTE — Telephone Encounter (Signed)
Please see encounter below

## 2015-04-28 ENCOUNTER — Ambulatory Visit: Payer: Medicare Other | Admitting: Internal Medicine

## 2015-04-29 ENCOUNTER — Other Ambulatory Visit: Payer: Self-pay | Admitting: Internal Medicine

## 2015-05-18 ENCOUNTER — Encounter: Payer: Self-pay | Admitting: Internal Medicine

## 2015-05-18 ENCOUNTER — Ambulatory Visit (INDEPENDENT_AMBULATORY_CARE_PROVIDER_SITE_OTHER)
Admission: RE | Admit: 2015-05-18 | Discharge: 2015-05-18 | Disposition: A | Discharge status: Home / Self Care | Payer: Medicare Other | Source: Ambulatory Visit | Attending: Internal Medicine | Admitting: Internal Medicine

## 2015-05-18 ENCOUNTER — Ambulatory Visit (INDEPENDENT_AMBULATORY_CARE_PROVIDER_SITE_OTHER): Payer: Medicare Other | Admitting: Internal Medicine

## 2015-05-18 VITALS — BP 136/76 | HR 76 | Temp 98.0°F | Resp 14 | Ht 66.0 in | Wt 203.0 lb

## 2015-05-18 DIAGNOSIS — M25561 Pain in right knee: Secondary | ICD-10-CM | POA: Diagnosis not present

## 2015-05-18 DIAGNOSIS — F411 Generalized anxiety disorder: Secondary | ICD-10-CM | POA: Diagnosis not present

## 2015-05-18 MED ORDER — IBUPROFEN 800 MG PO TABS: 800.0000 mg | ORAL_TABLET | Freq: Every day | ORAL | Status: DC | PRN

## 2015-05-18 MED ORDER — CLONAZEPAM 0.5 MG PO TABS: 0.5000 mg | ORAL_TABLET | Freq: Three times a day (TID) | ORAL | Status: DC | PRN

## 2015-05-18 NOTE — Progress Notes (Signed)
Pre visit review using our clinic review tool, if applicable. No additional management support is needed unless otherwise documented below in the visit note. 

## 2015-05-18 NOTE — Patient Instructions (Signed)
We will have you replace the clorazepate with the new prescription clonazepam.   You can take it the same as the clorazepate. Call us with any problems or questions.

## 2015-05-18 NOTE — Progress Notes (Signed)
   Subjective:    Patient ID: Dominique Johnston, female    DOB: 1945-10-07, 70 y.o.   MRN: [id number]  HPI The patient is a 70 YO female coming in for her anxiety. She has been on clorazepate for many years for her anxiety. She is not able to get it anymore due to insurance coverage. She needs an alternative. She does use it daily for anxiety and has for many years. She is a little more anxious lately because she is concerned that she will not have anything. Denies depression and is very upbeat.   Review of Systems  Constitutional: Negative for fever, activity change, appetite change, fatigue and unexpected weight change.  Respiratory: Negative for cough, chest tightness, shortness of breath and wheezing.   Cardiovascular: Negative for chest pain, palpitations and leg swelling.  Gastrointestinal: Negative for abdominal pain, diarrhea, constipation, blood in stool and abdominal distention.  Musculoskeletal: Negative for myalgias, back pain and gait problem.  Neurological: Negative for dizziness, weakness, light-headedness and headaches.  Psychiatric/Behavioral: Negative for sleep disturbance and dysphoric mood. The patient is nervous/anxious.       Objective:   Physical Exam  Constitutional: She is oriented to person, place, and time. She appears well-developed and well-nourished. No distress.  HENT:  Head: Normocephalic and atraumatic.  Right Ear: External ear normal.  Left Ear: External ear normal.  Eyes: EOM are normal.  Neck: Normal range of motion.  Cardiovascular: Normal rate and regular rhythm.   Pulmonary/Chest: Effort normal and breath sounds normal. No respiratory distress. She has no wheezes. She has no rales.  Abdominal: Soft. Bowel sounds are normal. She exhibits no distension. There is no tenderness. There is no rebound.  Neurological: She is alert and oriented to person, place, and time. Coordination normal.  Skin: Skin is warm and dry.   Filed Vitals:   05/18/15 1328    BP: 136/76  Pulse: 76  Temp: 98 F (36.7 C)  TempSrc: Oral  Resp: 14  Height: 5\' 6"  (1.676 m)  Weight: 203 lb (92.08 kg)  SpO2: 98%      Assessment & Plan:

## 2015-05-19 NOTE — Assessment & Plan Note (Signed)
Takes zoloft to help with mood and the clorazepate she has been on for a long time. We will replace with clonazepam as a close alternative. Rx given today and she still has some clorazepate in case this is not a good switch and we need to adjust. Reminded her of the risk of long term benzodiazepene usage and she is aware and wishes to continue as they make her life significantly improved and she denies any side effects from the medicine.

## 2015-05-30 ENCOUNTER — Telehealth: Payer: Self-pay | Admitting: Internal Medicine

## 2015-05-30 ENCOUNTER — Other Ambulatory Visit: Payer: Self-pay | Admitting: Geriatric Medicine

## 2015-05-30 MED ORDER — POTASSIUM CHLORIDE ER 8 MEQ PO TBCR: 8.0000 meq | EXTENDED_RELEASE_TABLET | Freq: Every day | ORAL | Status: DC

## 2015-05-30 NOTE — Telephone Encounter (Signed)
Sent to pharmacy 

## 2015-05-30 NOTE — Telephone Encounter (Signed)
Pt request refill for KLOR-CON 8 MEQ tablet to be send to [facility]. Please help

## 2015-06-02 ENCOUNTER — Telehealth: Payer: Self-pay

## 2015-06-02 NOTE — Telephone Encounter (Signed)
Received call from insurance stating that the Clonazepam has been approved. Will mail out letter to MD & pt with approval status...Raechel Chute

## 2015-06-02 NOTE — Telephone Encounter (Signed)
PA initiated via covermymeds. Key for PA is [id number]

## 2015-07-08 ENCOUNTER — Emergency Department (HOSPITAL_COMMUNITY)
Admission: EM | Admit: 2015-07-08 | Discharge: 2015-07-08 | Disposition: A | Discharge status: Home / Self Care | Payer: Medicare Other | Source: Home / Self Care | Attending: Family Medicine | Admitting: Family Medicine

## 2015-07-08 ENCOUNTER — Encounter (HOSPITAL_COMMUNITY): Payer: Self-pay | Admitting: Emergency Medicine

## 2015-07-08 ENCOUNTER — Emergency Department (INDEPENDENT_AMBULATORY_CARE_PROVIDER_SITE_OTHER): Payer: Medicare Other

## 2015-07-08 DIAGNOSIS — R509 Fever, unspecified: Secondary | ICD-10-CM

## 2015-07-08 DIAGNOSIS — R058 Other specified cough: Secondary | ICD-10-CM

## 2015-07-08 DIAGNOSIS — R05 Cough: Secondary | ICD-10-CM | POA: Diagnosis not present

## 2015-07-08 MED ORDER — IBUPROFEN 800 MG PO TABS
400.0000 mg | ORAL_TABLET | Freq: Once | ORAL | Status: AC
  Administered 2015-07-08: 400 mg via ORAL

## 2015-07-08 MED ORDER — ALBUTEROL SULFATE (2.5 MG/3ML) 0.083% IN NEBU: 2.5000 mg | INHALATION_SOLUTION | Freq: Once | RESPIRATORY_TRACT | Status: DC

## 2015-07-08 MED ORDER — IBUPROFEN 800 MG PO TABS
ORAL_TABLET | ORAL | Status: AC
  Filled 2015-07-08: qty 1

## 2015-07-08 MED ORDER — ALBUTEROL SULFATE HFA 108 (90 BASE) MCG/ACT IN AERS: 2.0000 | INHALATION_SPRAY | Freq: Four times a day (QID) | RESPIRATORY_TRACT | Status: DC | PRN

## 2015-07-08 MED ORDER — DM-GUAIFENESIN ER 30-600 MG PO TB12: 2.0000 | ORAL_TABLET | Freq: Two times a day (BID) | ORAL | Status: DC | PRN

## 2015-07-08 MED ORDER — ALBUTEROL SULFATE (2.5 MG/3ML) 0.083% IN NEBU
2.5000 mg | INHALATION_SOLUTION | Freq: Once | RESPIRATORY_TRACT | Status: AC
  Administered 2015-07-08: 2.5 mg via RESPIRATORY_TRACT

## 2015-07-08 MED ORDER — ALBUTEROL SULFATE (2.5 MG/3ML) 0.083% IN NEBU
INHALATION_SOLUTION | RESPIRATORY_TRACT | Status: AC
  Filled 2015-07-08: qty 3

## 2015-07-08 NOTE — ED Notes (Signed)
The patient presented to the [facility] with a complaint of a headache, cough and shortness of breath with right sided otalgia and sinus pressure x 4 days.

## 2015-07-08 NOTE — Discharge Instructions (Signed)
It was a pleasure to see you today.   Your chest x-ray does not show findings of pneumonia.  I believe your symptoms are from a viral infection.   Mucinex  by mouth twice daily; Albuterol inhaler, 2 puffs by mouth every 4 hours as needed.  Tylenol and Motrin alternating for fever and aches.   Follow up with your primary doctor at the beginning of the week, or return to urgent care center if worsening beforehand.

## 2015-07-08 NOTE — ED Provider Notes (Addendum)
CSN: [id number]     Arrival date & time 07/08/15  1305 History   First MD Initiated Contact with Patient 07/08/15 1344     Chief Complaint  Patient presents with  . Cough  . Fever   (Consider location/radiation/quality/duration/timing/severity/associated sxs/prior Treatment) Patient is a 70 y.o. female presenting with cough and fever. The history is provided by the patient. No language interpreter was used.  Cough Associated symptoms: chills, diaphoresis, fever, shortness of breath and wheezing   Associated symptoms: no chest pain   Fever Associated symptoms: chills, congestion and cough   Associated symptoms: no chest pain, no diarrhea, no nausea and no vomiting   OF NOTE, PATIENT TRIAGE NOTE READS "CODE STROKE". PRELIMINARY ASSESSMENT OF PATIENT WITH CC COUGH AND FEVER, DENIES DYSARTHRIA OR FOCAL WEAKNESS. THUS, NO TRUE CODE STROKE  Patient presents with 3 days' history of worsening productive cough and malaise, fevers at home and wheezing.  Patient's 4 yr old granddaughter diagnosed with PNA one week ago and staying in patient's home. Patient has been using her albuterol more than once daily for shortness of breath.  She notes the early blooms on trees may be worsenign her asthma as well, is a trigger for her.  Daughter smokes outside the home; patient is nonsmoker.   Patient has received flu vaccine this season; received prevnar last year.   Past Medical History  Diagnosis Date  . Allergy     rhinitis  . Headache(784.0)   . Hypertension   . Asthma   . Bronchitis     recurrent  . Depression   . DJD (degenerative joint disease) of knee   . DJD (degenerative joint disease), lumbosacral   . Bouchard nodes (DJD hand)   . IBS (irritable bowel syndrome)   . Headache(784.0)   . Hyperlipidemia   . GERD (gastroesophageal reflux disease)   . Sinusitis, chronic    Past Surgical History  Procedure Laterality Date  . Tubal ligation  1977  . Breast lumpectomy  1998    benign; left  breast  . Tonsillectomy    . Septoplasty  2000  . Transnasal laryngoscopy  2005  . Orif ankle fracture  '07    Left ankle   Family History  Problem Relation Age of Onset  . Hypertension Father   . Coronary artery disease Father     MI/ Fatal  . Breast cancer Neg Hx   . Diabetes Neg Hx   . Colon cancer Neg Hx    Social History  Substance Use Topics  . Smoking status: Never Smoker   . Smokeless tobacco: Never Used  . Alcohol Use: No   OB History    No data available     Review of Systems  Constitutional: Positive for fever, chills, diaphoresis and fatigue.  HENT: Positive for congestion and postnasal drip.   Respiratory: Positive for cough, chest tightness, shortness of breath and wheezing.   Cardiovascular: Negative for chest pain and palpitations.  Gastrointestinal: Negative for nausea, vomiting and diarrhea.  All other systems reviewed and are negative.   Allergies  Enalapril maleate and Sulfamethoxazole-trimethoprim  Home Medications   Prior to Admission medications   Medication Sig Start Date End Date Taking? Authorizing Provider  albuterol (PROVENTIL HFA;VENTOLIN HFA) 108 (90 BASE) MCG/ACT inhaler Inhale 2 puffs into the lungs every 6 (six) hours as needed. 05/15/13  Yes Rodolph Bong, MD  Ascorbic Acid (VITAMIN C PO) Take 1 tablet by mouth daily.     Yes Historical Provider, MD  Cetirizine HCl (ZYRTEC PO) Take by mouth daily.   Yes Historical Provider, MD  Cyanocobalamin (B-12 PO) Take by mouth daily.     Yes Historical Provider, MD  dextromethorphan-guaiFENesin (MUCINEX DM) 30-600 MG 12hr tablet Take 2 tablets by mouth 2 (two) times daily as needed for cough.   Yes Historical Provider, MD  dicyclomine (BENTYL) 20 MG tablet Take 0.5 tablets (10 mg total) by mouth 3 (three) times daily before meals. 07/19/14  Yes [name], MD  diltiazem (CARDIZEM CD) 120 MG 24 hr capsule TAKE 1 CAPSULE (120 MG TOTAL) BY MOUTH DAILY. 03/14/15  Yes [name], MD   dorzolamide (TRUSOPT) 2 % ophthalmic solution Place into both eyes 2 (two) times daily.  01/13/14  Yes Historical Provider, MD  fluticasone furoate-vilanterol (BREO ELLIPTA) 100-25 MCG/INH AEPB Inhale 1 puff into the lungs daily.   Yes Historical Provider, MD  furosemide (LASIX) 40 MG tablet TAKE 1 TABLET (40 MG TOTAL) BY MOUTH DAILY. 01/19/15  Yes [name], MD  latanoprost (XALATAN) 0.005 % ophthalmic solution Place 1 drop into both eyes daily. 10/22/12  Yes Jacques Navy, MD  losartan (COZAAR) 100 MG tablet TAKE 1 TABLET (100 MG TOTAL) BY MOUTH DAILY. 04/11/15  Yes [name], MD  multivitamin [facility]) tablet Take 1 tablet by mouth daily.     Yes Historical Provider, MD  omeprazole (PRILOSEC) 40 MG capsule TAKE 1 CAPSULE (40 MG TOTAL) BY MOUTH DAILY. 02/13/15  Yes [name], MD  sertraline (ZOLOFT) 100 MG tablet TAKE 1&1/2 TABLETS DAILY 10/11/14  Yes [name], MD  clonazePAM (KLONOPIN) 0.5 MG tablet Take 1 tablet (0.5 mg total) by mouth 3 (three) times daily as needed for anxiety. 05/18/15   [name], MD  clorazepate (TRANXENE) 3.75 MG tablet Take 1 tablet (3.75 mg total) by mouth 3 (three) times daily as needed for anxiety. Patient not taking: Reported on 05/18/2015 04/04/15   [name], MD  ibuprofen (ADVIL,MOTRIN) 800 MG tablet Take 1 tablet (800 mg total) by mouth daily as needed for moderate pain. 05/18/15   [name], MD  KLOR-CON 8 MEQ tablet TAKE 1 TABLET BY MOUTH ONCE DAILY Patient not taking: Reported on 05/18/2015 05/01/15   [name], MD  levalbuterol Pauline Aus) 1.25 MG/3ML nebulizer solution Take 1.25 mg by nebulization as needed. 10/22/12   Jacques Navy, MD  mometasone-formoterol [facility]) 100-5 MCG/ACT AERO Take 1 pff in am and 1 in pm 04/18/15   Nyoka Cowden, MD  potassium chloride (KLOR-CON) 8 MEQ tablet Take 1 tablet (8 mEq total) by mouth daily. 05/30/15   [name], MD  SUMAtriptan  (IMITREX) 100 MG tablet Take 1 tablet (100 mg total) by mouth once. Take 1 tablet at onset of headache 03/07/14   [name], MD   Meds Ordered and Administered this Visit   Medications  albuterol (PROVENTIL) (2.5 MG/3ML) 0.083% nebulizer solution 2.5 mg (not administered)  ibuprofen (ADVIL,MOTRIN) tablet 400 mg (not administered)    BP 172/101 mmHg  Pulse 88  Temp(Src) 101.5 F (38.6 C) (Oral)  SpO2 99% No data found.   Physical Exam  Constitutional: She appears well-developed and well-nourished. No distress.  Alert and good historian; mildly ill appearing, temp 101.55F at time of assessment. No apparent distress.   HENT:  Head: Normocephalic.  Right Ear: External ear normal.  Left Ear: External ear normal.  Nose: Nose normal.  Mouth/Throat: Oropharynx is clear and moist. No  oropharyngeal exudate.  Watery rhinorrhea. No frontal or maxillary sinus tenderness  Eyes: Conjunctivae and EOM are normal. Pupils are equal, round, and reactive to light. No scleral icterus.  Neck: Normal range of motion. Neck supple. No thyromegaly present.  Shotty anterior cervical adenopathy  Cardiovascular: Normal rate, regular rhythm and normal heart sounds.   No murmur heard. Pulmonary/Chest: Effort normal and breath sounds normal. No stridor. She has no wheezes. She exhibits no tenderness.  Diffuse rhonchi; difficult to assess whether rales are present. No increased work of breathing, No wheezing present.   Abdominal: Soft.  Lymphadenopathy:    She has cervical adenopathy.    ED Course  Procedures (including critical care time)  Labs Review Labs Reviewed - No data to display  Imaging Review No results found.   Visual Acuity Review  Right Eye Distance:   Left Eye Distance:   Bilateral Distance:    Right Eye Near:   Left Eye Near:    Bilateral Near:         MDM  No diagnosis found. Patient with history asthma, recent sick contacts with PNA, presenting with gradually  worsening febrile respiratory illness.  Concern for lobar PNA; exam equivocal; for CXR in [facility] and neb treatment.  XR negative; to treat for viral URI and close follow up with primary physician.    Paula Compton, MD    Barbaraann Barthel, MD 07/08/15 [id number]  Barbaraann Barthel, MD 07/08/15 [phone]

## 2015-08-09 ENCOUNTER — Other Ambulatory Visit: Payer: Self-pay | Admitting: Internal Medicine

## 2015-09-27 ENCOUNTER — Other Ambulatory Visit: Payer: Self-pay | Admitting: Internal Medicine

## 2015-10-10 ENCOUNTER — Other Ambulatory Visit: Payer: Self-pay | Admitting: Internal Medicine

## 2015-10-30 ENCOUNTER — Other Ambulatory Visit: Payer: Self-pay | Admitting: Internal Medicine

## 2015-11-06 ENCOUNTER — Other Ambulatory Visit: Payer: Self-pay | Admitting: Internal Medicine

## 2015-11-27 ENCOUNTER — Other Ambulatory Visit: Payer: Self-pay | Admitting: Internal Medicine

## 2015-11-27 NOTE — Telephone Encounter (Signed)
Sent to pharmacy 

## 2015-12-01 ENCOUNTER — Other Ambulatory Visit: Payer: Self-pay | Admitting: Internal Medicine

## 2015-12-04 ENCOUNTER — Other Ambulatory Visit: Payer: Self-pay | Admitting: Internal Medicine

## 2015-12-25 ENCOUNTER — Other Ambulatory Visit: Payer: Self-pay | Admitting: Internal Medicine

## 2016-01-12 ENCOUNTER — Ambulatory Visit: Payer: Medicare Other | Admitting: Internal Medicine

## 2016-01-17 ENCOUNTER — Encounter: Payer: Self-pay | Admitting: Internal Medicine

## 2016-01-17 ENCOUNTER — Other Ambulatory Visit (INDEPENDENT_AMBULATORY_CARE_PROVIDER_SITE_OTHER): Payer: Medicare Other

## 2016-01-17 ENCOUNTER — Ambulatory Visit (INDEPENDENT_AMBULATORY_CARE_PROVIDER_SITE_OTHER): Payer: Medicare Other | Admitting: Internal Medicine

## 2016-01-17 VITALS — BP 172/80 | HR 83 | Temp 98.5°F | Resp 18 | Ht 66.0 in | Wt 201.0 lb

## 2016-01-17 DIAGNOSIS — E785 Hyperlipidemia, unspecified: Secondary | ICD-10-CM

## 2016-01-17 DIAGNOSIS — M25562 Pain in left knee: Secondary | ICD-10-CM

## 2016-01-17 DIAGNOSIS — F411 Generalized anxiety disorder: Secondary | ICD-10-CM

## 2016-01-17 DIAGNOSIS — I10 Essential (primary) hypertension: Secondary | ICD-10-CM

## 2016-01-17 DIAGNOSIS — R5383 Other fatigue: Secondary | ICD-10-CM | POA: Diagnosis not present

## 2016-01-17 DIAGNOSIS — Z23 Encounter for immunization: Secondary | ICD-10-CM

## 2016-01-17 DIAGNOSIS — M25561 Pain in right knee: Secondary | ICD-10-CM

## 2016-01-17 LAB — CBC
HCT: 39.6 % (ref 36.0–46.0)
Hemoglobin: 12.6 g/dL (ref 12.0–15.0)
MCHC: 31.9 g/dL (ref 30.0–36.0)
MCV: 71.3 fl — ABNORMAL LOW (ref 78.0–100.0)
Platelets: 241 10*3/uL (ref 150.0–400.0)
RBC: 5.56 Mil/uL — ABNORMAL HIGH (ref 3.87–5.11)
RDW: 15.4 % (ref 11.5–15.5)
WBC: 6.9 10*3/uL (ref 4.0–10.5)

## 2016-01-17 LAB — COMPREHENSIVE METABOLIC PANEL
ALT: 16 U/L (ref 0–35)
AST: 18 U/L (ref 0–37)
Albumin: 4.1 g/dL (ref 3.5–5.2)
Alkaline Phosphatase: 85 U/L (ref 39–117)
BUN: 13 mg/dL (ref 6–23)
CO2: 28 mEq/L (ref 19–32)
Calcium: 9.2 mg/dL (ref 8.4–10.5)
Chloride: 105 mEq/L (ref 96–112)
Creatinine, Ser: 0.96 mg/dL (ref 0.40–1.20)
GFR: 73.88 mL/min (ref >60.00)
Glucose, Bld: 97 mg/dL (ref 70–99)
Potassium: 3.6 mEq/L (ref 3.5–5.1)
Sodium: 139 mEq/L (ref 135–145)
Total Bilirubin: 0.5 mg/dL (ref 0.2–1.2)
Total Protein: 7.8 g/dL (ref 6.0–8.3)

## 2016-01-17 LAB — LIPID PANEL
Cholesterol: 212 mg/dL — ABNORMAL HIGH (ref 0–200)
HDL: 51.2 mg/dL (ref >39.00)
LDL Cholesterol: 140 mg/dL — ABNORMAL HIGH (ref 0–99)
NonHDL: 161
Total CHOL/HDL Ratio: 4
Triglycerides: 104 mg/dL (ref 0.0–149.0)
VLDL: 20.8 mg/dL (ref 0.0–40.0)

## 2016-01-17 LAB — VITAMIN B12: Vitamin B-12: >1500 pg/mL — ABNORMAL HIGH (ref 211–911)

## 2016-01-17 LAB — TSH: TSH: 2.14 u[IU]/mL (ref 0.35–4.50)

## 2016-01-17 MED ORDER — ESCITALOPRAM OXALATE 10 MG PO TABS: 10.0000 mg | ORAL_TABLET | Freq: Every day | ORAL | 1 refills | Status: DC

## 2016-01-17 NOTE — Progress Notes (Signed)
Pre visit review using our clinic review tool, if applicable. No additional management support is needed unless otherwise documented below in the visit note. 

## 2016-01-17 NOTE — Patient Instructions (Signed)
We will get you in with an orthopedic doctor to evaluate and treat the knees to get you doing better.   We are checking the labs today and will send the results on mychart.   We want you to stop taking the zoloft. When you stop taking it you can start the lexapro the next day. The lexapro will be 1 pill a day.   We will watch the blood pressure once the mood is doing better and then make adjustments if needed.   Stress and Stress Management Stress is a normal reaction to life events. It is what you feel when life demands more than you are used to or more than you can handle. Some stress can be useful. For example, the stress reaction can help you catch the last bus of the day, study for a test, or meet a deadline at work. But stress that occurs too often or for too long can cause problems. It can affect your emotional health and interfere with relationships and normal daily activities. Too much stress can weaken your immune system and increase your risk for physical illness. If you already have a medical problem, stress can make it worse. CAUSES  All sorts of life events may cause stress. An event that causes stress for one person may not be stressful for another person. Major life events commonly cause stress. These may be positive or negative. Examples include losing your job, moving into a new home, getting married, having a baby, or losing a loved one. Less obvious life events may also cause stress, especially if they occur day after day or in combination. Examples include working long hours, driving in traffic, caring for children, being in debt, or being in a difficult relationship. SIGNS AND SYMPTOMS Stress may cause emotional symptoms including, the following:  Anxiety. This is feeling worried, afraid, on edge, overwhelmed, or out of control.  Anger. This is feeling irritated or impatient.  Depression. This is feeling sad, down, helpless, or guilty.  Difficulty focusing, remembering, or  making decisions. Stress may cause physical symptoms, including the following:   Aches and pains. These may affect your head, neck, back, stomach, or other areas of your body.  Tight muscles or clenched jaw.  Low energy or trouble sleeping. Stress may cause unhealthy behaviors, including the following:   Eating to feel better (overeating) or skipping meals.  Sleeping too little, too much, or both.  Working too much or putting off tasks (procrastination).  Smoking, drinking alcohol, or using drugs to feel better. DIAGNOSIS  Stress is diagnosed through an assessment by your health care provider. Your health care provider will ask questions about your symptoms and any stressful life events.Your health care provider will also ask about your medical history and may order blood tests or other tests. Certain medical conditions and medicine can cause physical symptoms similar to stress. Mental illness can cause emotional symptoms and unhealthy behaviors similar to stress. Your health care provider may refer you to a mental health professional for further evaluation.  TREATMENT  Stress management is the recommended treatment for stress.The goals of stress management are reducing stressful life events and coping with stress in healthy ways.  Techniques for reducing stressful life events include the following:  Stress identification. Self-monitor for stress and identify what causes stress for you. These skills may help you to avoid some stressful events.  Time management. Set your priorities, keep a calendar of events, and learn to say "no." These tools can help you  avoid making too many commitments. Techniques for coping with stress include the following:  Rethinking the problem. Try to think realistically about stressful events rather than ignoring them or overreacting. Try to find the positives in a stressful situation rather than focusing on the negatives.  Exercise. Physical exercise can  release both physical and emotional tension. The key is to find a form of exercise you enjoy and do it regularly.  Relaxation techniques. These relax the body and mind. Examples include yoga, meditation, tai chi, biofeedback, deep breathing, progressive muscle relaxation, listening to music, being out in nature, journaling, and other hobbies. Again, the key is to find one or more that you enjoy and can do regularly.  Healthy lifestyle. Eat a balanced diet, get plenty of sleep, and do not smoke. Avoid using alcohol or drugs to relax.  Strong support network. Spend time with family, friends, or other people you enjoy being around.Express your feelings and talk things over with someone you trust. Counseling or talktherapy with a mental health professional may be helpful if you are having difficulty managing stress on your own. Medicine is typically not recommended for the treatment of stress.Talk to your health care provider if you think you need medicine for symptoms of stress. HOME CARE INSTRUCTIONS  Keep all follow-up visits as directed by your health care provider.  Take all medicines as directed by your health care provider. SEEK MEDICAL CARE IF:  Your symptoms get worse or you start having new symptoms.  You feel overwhelmed by your problems and can no longer manage them on your own. SEEK IMMEDIATE MEDICAL CARE IF:  You feel like hurting yourself or someone else.   This information is not intended to replace advice given to you by your health care provider. Make sure you discuss any questions you have with your health care provider.   Document Released: 10/23/2000 Document Revised: 05/20/2014 Document Reviewed: 12/22/2012 Elsevier Interactive Patient Education [employer].

## 2016-01-18 ENCOUNTER — Encounter: Payer: Self-pay | Admitting: Internal Medicine

## 2016-01-18 NOTE — Assessment & Plan Note (Signed)
She is doing worse and getting older has worsened this as well. No active suicidal ideation and we talked about if symptoms are worsened she needs to call us or seek care. She will stop zoloft and start lexapro for better control. Continue her klonopin prn for anxiety.

## 2016-01-18 NOTE — Progress Notes (Signed)
   Subjective:    Patient ID: Dominique Johnston, female    DOB: [birth year], 70 y.o.   MRN: [id number]  HPI The patient is a 70 YO female coming in for depression. She is more depressed lately although she is still taking her zoloft. She recently had her 70th birthday and thinks this has bothered her some. She has had fleeting thoughts of not being alive but no plan for suicide or lingering thoughts of this. She would like to switch medicines since she has been on this one for a long time. She is still having pleasure from activities she likes. She has been dealing with a lot this summer (she was keeping her 64 YO granddaughter the whole time).   Review of Systems  Constitutional: Negative for activity change, appetite change, fatigue, fever and unexpected weight change.  HENT: Positive for congestion, postnasal drip and rhinorrhea.   Respiratory: Negative for cough, chest tightness and shortness of breath.   Cardiovascular: Negative for chest pain, palpitations and leg swelling.  Gastrointestinal: Negative for abdominal distention, abdominal pain, blood in stool, constipation and diarrhea.  Musculoskeletal: Negative for back pain, gait problem and myalgias.  Skin: Negative.   Neurological: Negative for dizziness, weakness, light-headedness and headaches.  Psychiatric/Behavioral: Positive for decreased concentration and dysphoric mood. Negative for self-injury, sleep disturbance and suicidal ideas. The patient is nervous/anxious.       Objective:   Physical Exam  Constitutional: She is oriented to person, place, and time. She appears well-developed and well-nourished. No distress.  HENT:  Head: Normocephalic and atraumatic.  Right Ear: External ear normal.  Left Ear: External ear normal.  Oropharynx red with clear drainage.   Eyes: EOM are normal.  Neck: Normal range of motion.  Cardiovascular: Normal rate and regular rhythm.   Pulmonary/Chest: Effort normal and breath sounds normal. No  respiratory distress. She has no rales.  Minimal wheeze in the bases with expiration  Abdominal: Soft. Bowel sounds are normal. She exhibits no distension. There is no tenderness. There is no rebound.  Neurological: She is alert and oriented to person, place, and time. Coordination normal.  Skin: Skin is warm and dry.  Psychiatric:  Tearful during the encounter and stressed   Vitals:   01/17/16 1106 01/17/16 1148  BP: (!) 170/90 (!) 172/80  Pulse: 83   Resp: 18   Temp: 98.5 F (36.9 C)   TempSrc: Oral   SpO2: 97%   Weight: 200 lb 15.7 oz (91.2 kg)   Height: 5\' 6"  (1.676 m)       Assessment & Plan:  Flu shot given at visit.

## 2016-01-18 NOTE — Assessment & Plan Note (Signed)
High likely due to stress and will continue to monitor. Will adjust next time if needed.

## 2016-01-29 ENCOUNTER — Other Ambulatory Visit: Payer: Self-pay | Admitting: Internal Medicine

## 2016-02-14 ENCOUNTER — Encounter (HOSPITAL_COMMUNITY): Payer: Self-pay | Admitting: Emergency Medicine

## 2016-02-14 ENCOUNTER — Ambulatory Visit (HOSPITAL_COMMUNITY)
Admission: EM | Admit: 2016-02-14 | Discharge: 2016-02-14 | Disposition: A | Discharge status: Home / Self Care | Payer: Medicare Other | Attending: Physician Assistant | Admitting: Physician Assistant

## 2016-02-14 DIAGNOSIS — J40 Bronchitis, not specified as acute or chronic: Secondary | ICD-10-CM | POA: Diagnosis not present

## 2016-02-14 DIAGNOSIS — J329 Chronic sinusitis, unspecified: Secondary | ICD-10-CM | POA: Diagnosis not present

## 2016-02-14 MED ORDER — PREDNISONE 10 MG PO TABS: 20.0000 mg | ORAL_TABLET | Freq: Every day | ORAL | 0 refills | Status: DC

## 2016-02-14 MED ORDER — AMOXICILLIN-POT CLAVULANATE 875-125 MG PO TABS: 1.0000 | ORAL_TABLET | Freq: Two times a day (BID) | ORAL | 0 refills | Status: DC

## 2016-02-14 MED ORDER — ALBUTEROL SULFATE (2.5 MG/3ML) 0.083% IN NEBU
5.0000 mg | INHALATION_SOLUTION | Freq: Once | RESPIRATORY_TRACT | Status: AC
  Administered 2016-02-14: 5 mg via RESPIRATORY_TRACT

## 2016-02-14 MED ORDER — ALBUTEROL SULFATE (2.5 MG/3ML) 0.083% IN NEBU
INHALATION_SOLUTION | RESPIRATORY_TRACT | Status: AC
  Filled 2016-02-14: qty 6

## 2016-02-14 NOTE — ED Provider Notes (Signed)
CSN: [id number]     Arrival date & time 02/14/16  1040 History   First MD Initiated Contact with Patient 02/14/16 1140     Chief Complaint  Patient presents with  . Bronchitis   (Consider location/radiation/quality/duration/timing/severity/associated sxs/prior Treatment) HPI THIS IS A NEW PROBLEM: PT IS A 70 Y/O FEMALE WITH 10-12 DAY HX OF COUGH WITH SINUS PRESSURE AND DRAINAGE. NONE OF HER REGULAR MEDS ARE WORKING. SHE STATES THAT SHE HAS HAD WHEEZING. AND COUGH THAT KEEPS HER UP AT NIGHT. SHE DOES NOT SMOKE.    Past Medical History:  Diagnosis Date  . Allergy    rhinitis  . Asthma   . Bouchard nodes (DJD hand)   . Bronchitis    recurrent  . Depression   . DJD (degenerative joint disease) of knee   . DJD (degenerative joint disease), lumbosacral   . GERD (gastroesophageal reflux disease)   . Headache(784.0)   . Headache(784.0)   . Hyperlipidemia   . Hypertension   . IBS (irritable bowel syndrome)   . Sinusitis, chronic    Past Surgical History:  Procedure Laterality Date  . BREAST LUMPECTOMY  1998   benign; left breast  . ORIF ANKLE FRACTURE  '07   Left ankle  . SEPTOPLASTY  2000  . TONSILLECTOMY    . transnasal laryngoscopy  2005  . TUBAL LIGATION  1977   Family History  Problem Relation Age of Onset  . Hypertension Father   . Coronary artery disease Father     MI/ Fatal  . Breast cancer Neg Hx   . Diabetes Neg Hx   . Colon cancer Neg Hx    Social History  Substance Use Topics  . Smoking status: Never Smoker  . Smokeless tobacco: Never Used  . Alcohol use No   OB History    No data available     Review of Systems  Denies: HEADACHE, NAUSEA, ABDOMINAL PAIN, CHEST PAIN, CONGESTION, DYSURIA,    Allergies  Enalapril maleate and Sulfamethoxazole-trimethoprim  Home Medications   Prior to Admission medications   Medication Sig Start Date End Date Taking? Authorizing Provider  albuterol (PROVENTIL HFA;VENTOLIN HFA) 108 (90 Base) MCG/ACT inhaler Inhale 2  puffs into the lungs every 6 (six) hours as needed. 07/08/15   Barbaraann Barthel, MD  albuterol (PROVENTIL) (2.5 MG/3ML) 0.083% nebulizer solution Take 3 mLs (2.5 mg total) by nebulization once. 07/08/15   Barbaraann Barthel, MD  Ascorbic Acid (VITAMIN C PO) Take 1 tablet by mouth daily.      Historical Provider, MD  Cetirizine HCl (ZYRTEC PO) Take by mouth daily.    Historical Provider, MD  clonazePAM (KLONOPIN) 0.5 MG tablet TAKE 1 TABLET BY MOUTH THREE TIMES DAILY AS NEEDED FOR ANXIETY 11/27/15   [name], MD  clorazepate (TRANXENE) 3.75 MG tablet Take 1 tablet (3.75 mg total) by mouth 3 (three) times daily as needed for anxiety. 04/04/15   [name], MD  Cyanocobalamin (B-12 PO) Take by mouth daily.      Historical Provider, MD  dextromethorphan-guaiFENesin (MUCINEX DM) 30-600 MG 12hr tablet Take 2 tablets by mouth 2 (two) times daily as needed for cough. 07/08/15   Barbaraann Barthel, MD  dicyclomine (BENTYL) 20 MG tablet TAKE 1/2 TABLET BY MOUTH THREE TIMES DAILY BEFORE MEALS 11/06/15   [name], MD  dicyclomine (BENTYL) 20 MG tablet TAKE 1/2 TABLET BY MOUTH THREE TIMES DAILY BEFORE MEALS 01/30/16   [name], MD  diltiazem [facility] CD)  120 MG 24 hr capsule TAKE 1 CAPSULE (120 MG TOTAL) BY MOUTH DAILY. 03/14/15   Myrlene BrokerElizabeth A Crawford, MD  dorzolamide (TRUSOPT) 2 % ophthalmic solution Place into both eyes 2 (two) times daily.  01/13/14   Historical Provider, MD  escitalopram (LEXAPRO) 10 MG tablet Take 1 tablet (10 mg total) by mouth daily. 01/17/16   Myrlene BrokerElizabeth A Crawford, MD  fluticasone furoate-vilanterol (BREO ELLIPTA) 100-25 MCG/INH AEPB Inhale 1 puff into the lungs daily.    Historical Provider, MD  furosemide (LASIX) 40 MG tablet TAKE 1 TABLET BY MOUTH EVERY DAY 01/30/16   Myrlene BrokerElizabeth A Crawford, MD  ibuprofen (ADVIL,MOTRIN) 800 MG tablet Take 1 tablet (800 mg total) by mouth daily as needed for moderate pain. 05/18/15   Myrlene BrokerElizabeth A Crawford, MD  KLOR-CON 8 MEQ tablet TAKE  1 TABLET BY MOUTH ONCE DAILY 05/01/15   Myrlene BrokerElizabeth A Crawford, MD  latanoprost (XALATAN) 0.005 % ophthalmic solution Place 1 drop into both eyes daily. 10/22/12   Jacques NavyMichael E Norins, MD  levalbuterol Pauline Aus(XOPENEX) 1.25 MG/3ML nebulizer solution Take 1.25 mg by nebulization as needed. 10/22/12   Jacques NavyMichael E Norins, MD  losartan (COZAAR) 100 MG tablet TAKE 1 TABLET BY MOUTH EVERY DAY 09/28/15   Myrlene BrokerElizabeth A Crawford, MD  multivitamin [facility](THERAGRAN) tablet Take 1 tablet by mouth daily.      Historical Provider, MD  omeprazole (PRILOSEC) 40 MG capsule TAKE 1 CAPSULE BY MOUTH EVERY DAY 08/09/15   Myrlene BrokerElizabeth A Crawford, MD  SUMAtriptan (IMITREX) 100 MG tablet Take 1 tablet (100 mg total) by mouth once. Take 1 tablet at onset of headache 03/07/14   Myrlene BrokerElizabeth A Crawford, MD   Meds Ordered and Administered this Visit   Medications  albuterol (PROVENTIL) (2.5 MG/3ML) 0.083% nebulizer solution 5 mg (not administered)    BP 159/79 (BP Location: Left Arm)   Pulse 74   Temp 98.1 F (36.7 C) (Oral)   Resp 16   SpO2 99%  No data found.   Physical Exam NURSES NOTES AND VITAL SIGNS REVIEWED. CONSTITUTIONAL: Well developed, well nourished, no acute distress HEENT: normocephalic, atraumatic EYES: Conjunctiva normal NECK:normal ROM, supple, no adenopathy PULMONARY:No respiratory distress, normal effort, DIFFUSE WHEEZE AND COARSE BS THROUGHOUT CHEST. NO CONSOLIDATION. ABDOMINAL: Soft, ND, NT BS+, No CVAT MUSCULOSKELETAL: Normal ROM of all extremities,  SKIN: warm and dry without rash PSYCHIATRIC: Mood and affect, behavior are normal  Urgent Care Course   Clinical Course    Procedures (including critical care time)  Labs Review Labs Reviewed - No data to display  Imaging Review No results found.   Visual Acuity Review  Right Eye Distance:   Left Eye Distance:   Bilateral Distance:    Right Eye Near:   Left Eye Near:    Bilateral Near:         MDM   1. Sinobronchitis     Patient is reassured  that there are no issues that require transfer to higher level of care at this time or additional tests. Patient is advised to continue home symptomatic treatment. Patient is advised that if there are new or worsening symptoms to attend the emergency department, contact primary care provider, or return to UC. Instructions of care provided discharged home in stable condition.    THIS NOTE WAS GENERATED USING A VOICE RECOGNITION SOFTWARE PROGRAM. ALL REASONABLE EFFORTS  WERE MADE TO PROOFREAD THIS DOCUMENT FOR ACCURACY.  I have verbally reviewed the discharge instructions with the patient. A printed AVS was given to the patient.  All questions were answered prior to discharge.      Tharon Aquas, PA 02/14/16 [phone]

## 2016-02-14 NOTE — ED Triage Notes (Signed)
Complains of symptoms for 7-10 days.  Patient concerned for sinus infection and bronchitis

## 2016-02-29 ENCOUNTER — Other Ambulatory Visit: Payer: Self-pay | Admitting: Internal Medicine

## 2016-03-08 ENCOUNTER — Telehealth: Payer: Self-pay

## 2016-03-08 MED ORDER — SUMATRIPTAN SUCCINATE 100 MG PO TABS: 100.0000 mg | ORAL_TABLET | Freq: Once | ORAL | 2 refills | Status: DC

## 2016-03-08 NOTE — Telephone Encounter (Signed)
Pt lmom rq rf of imitrex. Pt has had recent OV. Erx sent.

## 2016-03-14 ENCOUNTER — Encounter: Payer: Self-pay | Admitting: Internal Medicine

## 2016-03-14 ENCOUNTER — Ambulatory Visit (INDEPENDENT_AMBULATORY_CARE_PROVIDER_SITE_OTHER): Payer: Medicare Other | Admitting: Internal Medicine

## 2016-03-14 VITALS — BP 138/80 | HR 72 | Temp 98.4°F | Resp 12 | Ht 66.0 in | Wt 203.0 lb

## 2016-03-14 DIAGNOSIS — M150 Primary generalized (osteo)arthritis: Secondary | ICD-10-CM | POA: Diagnosis not present

## 2016-03-14 DIAGNOSIS — M159 Polyosteoarthritis, unspecified: Secondary | ICD-10-CM

## 2016-03-14 DIAGNOSIS — E2839 Other primary ovarian failure: Secondary | ICD-10-CM | POA: Diagnosis not present

## 2016-03-14 DIAGNOSIS — R059 Cough, unspecified: Secondary | ICD-10-CM

## 2016-03-14 DIAGNOSIS — M8949 Other hypertrophic osteoarthropathy, multiple sites: Secondary | ICD-10-CM

## 2016-03-14 DIAGNOSIS — R05 Cough: Secondary | ICD-10-CM

## 2016-03-14 DIAGNOSIS — F411 Generalized anxiety disorder: Secondary | ICD-10-CM | POA: Diagnosis not present

## 2016-03-14 NOTE — Progress Notes (Signed)
   Subjective:    Patient ID: Dominique Johnston, female    DOB: October 12, 1945, 70 y.o.   MRN: [id number]  HPI The patient is a 70 YO female coming in for several concerns including urgent care follow up (in for bronchitis with wheezing, given steroids and antibiotics, now breathing better, some mild cough without production still, no nasal or sinus congestion, no SOB with activity, never smoker, still taking her allergy medications) and left knee pain (sent to orthopedics but she did not arrive early enough so they tried to reschedule her and she got upset and did not do that, wants a new referral for a different person, still having pain and intermittent swelling in the left knee, taking otc medications which are not that effective) and her depression (taking lexapro since last visit, did not follow up for this as expected, doing well and feeling less labile, she denies side effects, more energy).   Review of Systems  Constitutional: Negative.   HENT: Negative for congestion, ear discharge, ear pain, postnasal drip, rhinorrhea, sinus pressure, sore throat and trouble swallowing.   Eyes: Negative.   Respiratory: Positive for cough. Negative for chest tightness, shortness of breath and wheezing.   Cardiovascular: Negative.   Gastrointestinal: Negative.   Musculoskeletal: Negative.   Skin: Negative.   Neurological: Negative.   Psychiatric/Behavioral: Positive for decreased concentration. Negative for behavioral problems, dysphoric mood, self-injury, sleep disturbance and suicidal ideas. The patient is not nervous/anxious and is not hyperactive.        Much better than prior      Objective:   Physical Exam  Constitutional: She is oriented to person, place, and time. She appears well-developed and well-nourished.  HENT:  Head: Normocephalic and atraumatic.  Oropharynx with erythema and redness, mild clear drainage  Eyes: EOM are normal.  Neck: Normal range of motion.  Cardiovascular: Normal rate  and regular rhythm.   Pulmonary/Chest: Effort normal and breath sounds normal. No respiratory distress. She has no wheezes. She has no rales. She exhibits no tenderness.  Abdominal: Soft. Bowel sounds are normal. She exhibits no distension. There is no tenderness. There is no rebound.  Musculoskeletal: She exhibits no edema.  Neurological: She is alert and oriented to person, place, and time. Coordination normal.  Skin: Skin is warm and dry.  Psychiatric: She has a normal mood and affect.  Some wandering historian but better than prior   Vitals:   03/14/16 0903  BP: 138/80  Pulse: 72  Resp: 12  Temp: 98.4 F (36.9 C)  TempSrc: Oral  SpO2: 97%  Weight: 203 lb (92.1 kg)  Height: 5\' 6"  (1.676 m)      Assessment & Plan:

## 2016-03-14 NOTE — Assessment & Plan Note (Signed)
She has taken prednisone and augmentin for the sinus infection/bronchitis. She is now feeling better and lungs are clear. We discussed the likelihood that her cough will take several more weeks to resolve. She can use otc cough lozenges as needed. Not on ACE-I.

## 2016-03-14 NOTE — Progress Notes (Signed)
Pre visit review using our clinic review tool, if applicable. No additional management support is needed unless otherwise documented below in the visit note. 

## 2016-03-14 NOTE — Assessment & Plan Note (Signed)
Refer to orthopedics for the left knee pain. Talked with her about needing to show up early in order to avoid missing her visit. She will arrive early the next time.

## 2016-03-14 NOTE — Assessment & Plan Note (Signed)
Lexapro is helping well and she is not as labile. Will continue. Does not need dose adjustment today.

## 2016-03-14 NOTE — Patient Instructions (Signed)
We have ordered the bone density test and you will do that at our office in the basement. They will call about scheduling.   We will get you in with the orthopedic doctor.   Your lungs sound clear today.

## 2016-03-28 ENCOUNTER — Ambulatory Visit (INDEPENDENT_AMBULATORY_CARE_PROVIDER_SITE_OTHER): Payer: Medicare Other | Admitting: Internal Medicine

## 2016-03-28 ENCOUNTER — Encounter: Payer: Self-pay | Admitting: Internal Medicine

## 2016-03-28 ENCOUNTER — Ambulatory Visit (INDEPENDENT_AMBULATORY_CARE_PROVIDER_SITE_OTHER)
Admission: RE | Admit: 2016-03-28 | Discharge: 2016-03-28 | Disposition: A | Discharge status: Home / Self Care | Payer: Medicare Other | Source: Ambulatory Visit | Attending: Internal Medicine | Admitting: Internal Medicine

## 2016-03-28 VITALS — BP 182/102 | HR 75 | Temp 98.6°F | Resp 12 | Ht 66.0 in | Wt 204.0 lb

## 2016-03-28 DIAGNOSIS — E789 Disorder of lipoprotein metabolism, unspecified: Secondary | ICD-10-CM | POA: Diagnosis not present

## 2016-03-28 DIAGNOSIS — I10 Essential (primary) hypertension: Secondary | ICD-10-CM | POA: Diagnosis not present

## 2016-03-28 DIAGNOSIS — Z0000 Encounter for general adult medical examination without abnormal findings: Secondary | ICD-10-CM | POA: Diagnosis not present

## 2016-03-28 DIAGNOSIS — J3089 Other allergic rhinitis: Secondary | ICD-10-CM | POA: Diagnosis not present

## 2016-03-28 DIAGNOSIS — E2839 Other primary ovarian failure: Secondary | ICD-10-CM

## 2016-03-28 MED ORDER — ALBUTEROL SULFATE HFA 108 (90 BASE) MCG/ACT IN AERS: 2.0000 | INHALATION_SPRAY | Freq: Four times a day (QID) | RESPIRATORY_TRACT | 1 refills | Status: AC | PRN

## 2016-03-28 MED ORDER — CLONAZEPAM 0.5 MG PO TABS
0.5000 mg | ORAL_TABLET | Freq: Three times a day (TID) | ORAL | 3 refills | Status: DC | PRN
Start: 2016-03-28 — End: 2016-10-18

## 2016-03-28 NOTE — Assessment & Plan Note (Signed)
Colonoscopy is up to date, mammogram due in 2018, flu shot already this season, tdap up to date, pneumonia series complete, shingles done. Counseled on need for regular exercise and dangers of distracted driving. Given 10 year screening recommendations.

## 2016-03-28 NOTE — Progress Notes (Signed)
Pre visit review using our clinic review tool, if applicable. No additional management support is needed unless otherwise documented below in the visit note. 

## 2016-03-28 NOTE — Assessment & Plan Note (Signed)
BMI 30s but complicated by osteoarthritis and difficult to control BP.

## 2016-03-28 NOTE — Assessment & Plan Note (Signed)
Recent lipid panel at goal without medications and she wishes to avoid medication.

## 2016-03-28 NOTE — Assessment & Plan Note (Signed)
Did not take meds this morning, BP earlier this week with health screening nurse at her home was 140s/80s. Taking losartan 100, lasix 40 mg, diltiazem 120 mg daily. She does not want to change regimen today due to previous controlled BP. Recent CMP without indication for change.

## 2016-03-28 NOTE — Patient Instructions (Signed)
We will get the bone density today and call you back with the results.   We do not need blood work today.  Health Maintenance, Female Introduction Adopting a healthy lifestyle and getting preventive care can go a long way to promote health and wellness. Talk with your health care provider about what schedule of regular examinations is right for you. This is a good chance for you to check in with your provider about disease prevention and staying healthy. In between checkups, there are plenty of things you can do on your own. Experts have done a lot of research about which lifestyle changes and preventive measures are most likely to keep you healthy. Ask your health care provider for more information. Weight and diet Eat a healthy diet  Be sure to include plenty of vegetables, fruits, low-fat dairy products, and lean protein.  Do not eat a lot of foods high in solid fats, added sugars, or salt.  Get regular exercise. This is one of the most important things you can do for your health.  Most adults should exercise for at least 150 minutes each week. The exercise should increase your heart rate and make you sweat (moderate-intensity exercise).  Most adults should also do strengthening exercises at least twice a week. This is in addition to the moderate-intensity exercise. Maintain a healthy weight  Body mass index (BMI) is a measurement that can be used to identify possible weight problems. It estimates body fat based on height and weight. Your health care provider can help determine your BMI and help you achieve or maintain a healthy weight.  For females 17 years of age and older:  A BMI below 18.5 is considered underweight.  A BMI of 18.5 to 24.9 is normal.  A BMI of 25 to 29.9 is considered overweight.  A BMI of 30 and above is considered obese. Watch levels of cholesterol and blood lipids  You should start having your blood tested for lipids and cholesterol at 70 years of age,  then have this test every 5 years.  You may need to have your cholesterol levels checked more often if:  Your lipid or cholesterol levels are high.  You are older than 70 years of age.  You are at high risk for heart disease. Cancer screening Lung Cancer  Lung cancer screening is recommended for adults 46-48 years old who are at high risk for lung cancer because of a history of smoking.  A yearly low-dose CT scan of the lungs is recommended for people who:  Currently smoke.  Have quit within the past 15 years.  Have at least a 30-pack-year history of smoking. A pack year is smoking an average of one pack of cigarettes a day for 1 year.  Yearly screening should continue until it has been 15 years since you quit.  Yearly screening should stop if you develop a health problem that would prevent you from having lung cancer treatment. Breast Cancer  Practice breast self-awareness. This means understanding how your breasts normally appear and feel.  It also means doing regular breast self-exams. Let your health care provider know about any changes, no matter how small.  If you are in your 20s or 30s, you should have a clinical breast exam (CBE) by a health care provider every 1-3 years as part of a regular health exam.  If you are 25 or older, have a CBE every year. Also consider having a breast X-ray (mammogram) every year.  If you have a  family history of breast cancer, talk to your health care provider about genetic screening.  If you are at high risk for breast cancer, talk to your health care provider about having an MRI and a mammogram every year.  Breast cancer gene (BRCA) assessment is recommended for women who have family members with BRCA-related cancers. BRCA-related cancers include:  Breast.  Ovarian.  Tubal.  Peritoneal cancers.  Results of the assessment will determine the need for genetic counseling and BRCA1 and BRCA2 testing. Cervical Cancer  Your health  care provider may recommend that you be screened regularly for cancer of the pelvic organs (ovaries, uterus, and vagina). This screening involves a pelvic examination, including checking for microscopic changes to the surface of your cervix (Pap test). You may be encouraged to have this screening done every 3 years, beginning at age 53.  For women ages 41-65, health care providers may recommend pelvic exams and Pap testing every 3 years, or they may recommend the Pap and pelvic exam, combined with testing for human papilloma virus (HPV), every 5 years. Some types of HPV increase your risk of cervical cancer. Testing for HPV may also be done on women of any age with unclear Pap test results.  Other health care providers may not recommend any screening for nonpregnant women who are considered low risk for pelvic cancer and who do not have symptoms. Ask your health care provider if a screening pelvic exam is right for you.  If you have had past treatment for cervical cancer or a condition that could lead to cancer, you need Pap tests and screening for cancer for at least 20 years after your treatment. If Pap tests have been discontinued, your risk factors (such as having a new sexual partner) need to be reassessed to determine if screening should resume. Some women have medical problems that increase the chance of getting cervical cancer. In these cases, your health care provider may recommend more frequent screening and Pap tests. Colorectal Cancer  This type of cancer can be detected and often prevented.  Routine colorectal cancer screening usually begins at 70 years of age and continues through 70 years of age.  Your health care provider may recommend screening at an earlier age if you have risk factors for colon cancer.  Your health care provider may also recommend using home test kits to check for hidden blood in the stool.  A small camera at the end of a tube can be used to examine your colon  directly (sigmoidoscopy or colonoscopy). This is done to check for the earliest forms of colorectal cancer.  Routine screening usually begins at age 31.  Direct examination of the colon should be repeated every 5-10 years through 71 years of age. However, you may need to be screened more often if early forms of precancerous polyps or small growths are found. Skin Cancer  Check your skin from head to toe regularly.  Tell your health care provider about any new moles or changes in moles, especially if there is a change in a mole's shape or color.  Also tell your health care provider if you have a mole that is larger than the size of a pencil eraser.  Always use sunscreen. Apply sunscreen liberally and repeatedly throughout the day.  Protect yourself by wearing long sleeves, pants, a wide-brimmed hat, and sunglasses whenever you are outside. Heart disease, diabetes, and high blood pressure  High blood pressure causes heart disease and increases the risk of stroke. High blood  pressure is more likely to develop in:  People who have blood pressure in the high end of the normal range (130-139/85-89 mm Hg).  People who are overweight or obese.  People who are African American.  If you are 33-37 years of age, have your blood pressure checked every 3-5 years. If you are 63 years of age or older, have your blood pressure checked every year. You should have your blood pressure measured twice-once when you are at a hospital or clinic, and once when you are not at a hospital or clinic. Record the average of the two measurements. To check your blood pressure when you are not at a hospital or clinic, you can use:  An automated blood pressure machine at a pharmacy.  A home blood pressure monitor.  If you are between 54 years and 24 years old, ask your health care provider if you should take aspirin to prevent strokes.  Have regular diabetes screenings. This involves taking a blood sample to check  your fasting blood sugar level.  If you are at a normal weight and have a low risk for diabetes, have this test once every three years after 70 years of age.  If you are overweight and have a high risk for diabetes, consider being tested at a younger age or more often. Preventing infection Hepatitis B  If you have a higher risk for hepatitis B, you should be screened for this virus. You are considered at high risk for hepatitis B if:  You were born in a country where hepatitis B is common. Ask your health care provider which countries are considered high risk.  Your parents were born in a high-risk country, and you have not been immunized against hepatitis B (hepatitis B vaccine).  You have HIV or AIDS.  You use needles to inject street drugs.  You live with someone who has hepatitis B.  You have had sex with someone who has hepatitis B.  You get hemodialysis treatment.  You take certain medicines for conditions, including cancer, organ transplantation, and autoimmune conditions. Hepatitis C  Blood testing is recommended for:  Everyone born from [birth year] through 1965.  Anyone with known risk factors for hepatitis C. Sexually transmitted infections (STIs)  You should be screened for sexually transmitted infections (STIs) including gonorrhea and chlamydia if:  You are sexually active and are younger than 70 years of age.  You are older than 70 years of age and your health care provider tells you that you are at risk for this type of infection.  Your sexual activity has changed since you were last screened and you are at an increased risk for chlamydia or gonorrhea. Ask your health care provider if you are at risk.  If you do not have HIV, but are at risk, it may be recommended that you take a prescription medicine daily to prevent HIV infection. This is called pre-exposure prophylaxis (PrEP). You are considered at risk if:  You are sexually active and do not regularly use  condoms or know the HIV status of your partner(s).  You take drugs by injection.  You are sexually active with a partner who has HIV. Talk with your health care provider about whether you are at high risk of being infected with HIV. If you choose to begin PrEP, you should first be tested for HIV. You should then be tested every 3 months for as long as you are taking PrEP. Pregnancy  If you are premenopausal and you may  become pregnant, ask your health care provider about preconception counseling.  If you may become pregnant, take 400 to 800 micrograms (mcg) of folic acid every day.  If you want to prevent pregnancy, talk to your health care provider about birth control (contraception). Osteoporosis and menopause  Osteoporosis is a disease in which the bones lose minerals and strength with aging. This can result in serious bone fractures. Your risk for osteoporosis can be identified using a bone density scan.  If you are 33 years of age or older, or if you are at risk for osteoporosis and fractures, ask your health care provider if you should be screened.  Ask your health care provider whether you should take a calcium or vitamin D supplement to lower your risk for osteoporosis.  Menopause may have certain physical symptoms and risks.  Hormone replacement therapy may reduce some of these symptoms and risks. Talk to your health care provider about whether hormone replacement therapy is right for you. Follow these instructions at home:  Schedule regular health, dental, and eye exams.  Stay current with your immunizations.  Do not use any tobacco products including cigarettes, chewing tobacco, or electronic cigarettes.  If you are pregnant, do not drink alcohol.  If you are breastfeeding, limit how much and how often you drink alcohol.  Limit alcohol intake to no more than 1 drink per day for nonpregnant women. One drink equals 12 ounces of beer, 5 ounces of wine, or 1 ounces of  hard liquor.  Do not use street drugs.  Do not share needles.  Ask your health care provider for help if you need support or information about quitting drugs.  Tell your health care provider if you often feel depressed.  Tell your health care provider if you have ever been abused or do not feel safe at home. This information is not intended to replace advice given to you by your health care provider. Make sure you discuss any questions you have with your health care provider. Document Released: 11/12/2010 Document Revised: 10/05/2015 Document Reviewed: 01/31/2015  2017 [employer]

## 2016-03-28 NOTE — Progress Notes (Signed)
   Subjective:    Patient ID: Dominique Johnston, female    DOB: [birth year], 70 y.o.   MRN: [id number]  HPI Here for medicare wellness and physical, no new complaints. Please see A/P for status and treatment of chronic medical problems.   Diet: heart healthy  Physical activity: sedentary Depression/mood screen: negative Hearing: intact to whispered voice Visual acuity: grossly normal with lens, performs annual eye exam  ADLs: capable Fall risk: low Home safety: good Cognitive evaluation: intact to orientation, naming, recall and repetition EOL planning: adv directives discussed, in place  I have personally reviewed and have noted 1. The patient's medical and social history - reviewed today no changes 2. Their use of alcohol, tobacco or illicit drugs 3. Their current medications and supplements 4. The patient's functional ability including ADL's, fall risks, home safety risks and hearing or visual impairment. 5. Diet and physical activities 6. Evidence for depression or mood disorders 7. Care team reviewed and updated (available in snapshot)  Review of Systems  Constitutional: Negative.   HENT: Positive for congestion, postnasal drip and rhinorrhea. Negative for sinus pain, sinus pressure, sore throat and trouble swallowing.   Eyes: Negative.   Respiratory: Positive for cough. Negative for chest tightness, shortness of breath and wheezing.   Cardiovascular: Negative for chest pain, palpitations and leg swelling.  Gastrointestinal: Negative for abdominal distention, abdominal pain, constipation, diarrhea, nausea and vomiting.  Musculoskeletal: Positive for arthralgias. Negative for back pain, gait problem, myalgias, neck pain and neck stiffness.  Skin: Negative.   Neurological: Negative.   Psychiatric/Behavioral: Negative.       Objective:   Physical Exam  Constitutional: She is oriented to person, place, and time. She appears well-developed and well-nourished.  HENT:  Head:  Normocephalic and atraumatic.  Right Ear: External ear normal.  Left Ear: External ear normal.  Oropharynx with redness and clear drainage  Eyes: EOM are normal.  Neck: Normal range of motion. No JVD present.  Cardiovascular: Normal rate and regular rhythm.   Pulmonary/Chest: Effort normal and breath sounds normal. No respiratory distress. She has no wheezes. She has no rales.  Abdominal: Soft. Bowel sounds are normal. She exhibits no distension. There is no tenderness. There is no rebound.  Musculoskeletal: She exhibits no edema.  Lymphadenopathy:    She has no cervical adenopathy.  Neurological: She is alert and oriented to person, place, and time. Coordination normal.  Skin: Skin is warm and dry.  Psychiatric: She has a normal mood and affect.   Vitals:   03/28/16 1033 03/28/16 1117  BP: (!) 180/110 (!) 182/102  Pulse: 75   Resp: 12   Temp: 98.6 F (37 C)   TempSrc: Oral   SpO2: 97%   Weight: 204 lb (92.5 kg)   Height: 5\' 6"  (1.676 m)       Assessment & Plan:

## 2016-03-28 NOTE — Assessment & Plan Note (Signed)
Seeing her allergy specialist tomorrow, no indication for antibiotics or prednisone today, advised to continue current regimen and increase her saline nose spray to help more.

## 2016-04-17 ENCOUNTER — Other Ambulatory Visit: Payer: Self-pay | Admitting: Internal Medicine

## 2016-04-29 ENCOUNTER — Other Ambulatory Visit: Payer: Self-pay | Admitting: Internal Medicine

## 2016-05-27 ENCOUNTER — Other Ambulatory Visit: Payer: Self-pay | Admitting: Internal Medicine

## 2016-05-31 ENCOUNTER — Encounter (HOSPITAL_COMMUNITY): Payer: Self-pay | Admitting: *Deleted

## 2016-05-31 ENCOUNTER — Ambulatory Visit (HOSPITAL_COMMUNITY)
Admission: EM | Admit: 2016-05-31 | Discharge: 2016-05-31 | Disposition: A | Discharge status: Home / Self Care | Payer: PPO | Attending: Family Medicine | Admitting: Family Medicine

## 2016-05-31 DIAGNOSIS — R05 Cough: Secondary | ICD-10-CM | POA: Diagnosis not present

## 2016-05-31 DIAGNOSIS — R059 Cough, unspecified: Secondary | ICD-10-CM

## 2016-05-31 DIAGNOSIS — J209 Acute bronchitis, unspecified: Secondary | ICD-10-CM | POA: Diagnosis not present

## 2016-05-31 DIAGNOSIS — J0100 Acute maxillary sinusitis, unspecified: Secondary | ICD-10-CM

## 2016-05-31 MED ORDER — PREDNISONE 10 MG (21) PO TBPK: 10.0000 mg | ORAL_TABLET | Freq: Every day | ORAL | 0 refills | Status: DC

## 2016-05-31 MED ORDER — METHYLPREDNISOLONE ACETATE 80 MG/ML IJ SUSP
80.0000 mg | Freq: Once | INTRAMUSCULAR | Status: AC
  Administered 2016-05-31: 80 mg via INTRAMUSCULAR

## 2016-05-31 MED ORDER — BENZONATATE 100 MG PO CAPS: 200.0000 mg | ORAL_CAPSULE | Freq: Three times a day (TID) | ORAL | 0 refills | Status: DC | PRN

## 2016-05-31 MED ORDER — METHYLPREDNISOLONE ACETATE 80 MG/ML IJ SUSP
INTRAMUSCULAR | Status: AC
  Filled 2016-05-31: qty 1

## 2016-05-31 MED ORDER — AMOXICILLIN-POT CLAVULANATE ER 1000-62.5 MG PO TB12: 2.0000 | ORAL_TABLET | Freq: Two times a day (BID) | ORAL | 0 refills | Status: DC

## 2016-05-31 NOTE — ED Triage Notes (Signed)
t  Has   Symptoms  Of  A  Non   Productive   Cough   With  Sinus  Drainage   And  Production   With  Ticking  Sensation in her  Throat   With  Onset  Of  Symptoms  For  sevreal  Days

## 2016-05-31 NOTE — ED Provider Notes (Signed)
CSN: [id number]     Arrival date & time 05/31/16  1716 History   None    Chief Complaint  Patient presents with  . Cough   (Consider location/radiation/quality/duration/timing/severity/associated sxs/prior Treatment) Patient has been having facial pain coughing wheezing and she states she is asthmatic and has been having to use her SABA a lot and it is not helping.   The history is provided by the patient.  Cough  Cough characteristics:  Productive Sputum characteristics:  Yellow Severity:  Severe Onset quality:  Sudden Duration:  3 days Timing:  Constant Progression:  Worsening Chronicity:  New Smoker: no   Context: weather changes   Relieved by:  Nothing Worsened by:  Nothing Ineffective treatments:  Beta-agonist inhaler, fluids, rest and steroid inhaler Associated symptoms: chills, fever, headaches, rhinorrhea, shortness of breath, sinus congestion and wheezing     Past Medical History:  Diagnosis Date  . Allergy    rhinitis  . Asthma   . Bouchard nodes (DJD hand)   . Bronchitis    recurrent  . Depression   . DJD (degenerative joint disease) of knee   . DJD (degenerative joint disease), lumbosacral   . GERD (gastroesophageal reflux disease)   . Headache(784.0)   . Headache(784.0)   . Hyperlipidemia   . Hypertension   . IBS (irritable bowel syndrome)   . Sinusitis, chronic    Past Surgical History:  Procedure Laterality Date  . BREAST LUMPECTOMY  1998   benign; left breast  . ORIF ANKLE FRACTURE  '07   Left ankle  . SEPTOPLASTY  2000  . TONSILLECTOMY    . transnasal laryngoscopy  2005  . TUBAL LIGATION  1977   Family History  Problem Relation Age of Onset  . Hypertension Father   . Coronary artery disease Father     MI/ Fatal  . Breast cancer Neg Hx   . Diabetes Neg Hx   . Colon cancer Neg Hx    Social History  Substance Use Topics  . Smoking status: Never Smoker  . Smokeless tobacco: Never Used  . Alcohol use No   OB History    No data  available     Review of Systems  Constitutional: Positive for chills and fever.  HENT: Positive for rhinorrhea.   Eyes: Negative.   Respiratory: Positive for cough, shortness of breath and wheezing.   Cardiovascular: Negative.   Gastrointestinal: Negative.   Endocrine: Negative.   Genitourinary: Negative.   Musculoskeletal: Negative.   Allergic/Immunologic: Negative.   Neurological: Positive for headaches.  Hematological: Negative.   Psychiatric/Behavioral: Negative.     Allergies  Enalapril maleate and Sulfamethoxazole-trimethoprim  Home Medications   Prior to Admission medications   Medication Sig Start Date End Date Taking? Authorizing Provider  albuterol (PROVENTIL HFA;VENTOLIN HFA) 108 (90 Base) MCG/ACT inhaler Inhale 2 puffs into the lungs every 6 (six) hours as needed. 03/28/16   Myrlene BrokerElizabeth A Crawford, MD  albuterol (PROVENTIL) (2.5 MG/3ML) 0.083% nebulizer solution Take 3 mLs (2.5 mg total) by nebulization once. 07/08/15   Barbaraann BarthelJames O Breen, MD  amoxicillin-clavulanate (AUGMENTIN XR) 1000-62.5 MG 12 hr tablet Take 2 tablets by mouth 2 (two) times daily. 05/31/16   Deatra CanterWilliam J Bayan Hedstrom, FNP  Ascorbic Acid (VITAMIN C PO) Take 1 tablet by mouth daily.      Historical Provider, MD  benzonatate (TESSALON) 100 MG capsule Take 2 capsules (200 mg total) by mouth 3 (three) times daily as needed for cough. 05/31/16   Deatra CanterWilliam J Kaeden Depaz, FNP  BREO ELLIPTA 200-25 MCG/INH AEPB INHALE 1 PUFF PO QD 02/27/16   Historical Provider, MD  Cetirizine HCl (ZYRTEC PO) Take by mouth daily.    Historical Provider, MD  clonazePAM (KLONOPIN) 0.5 MG tablet Take 1 tablet (0.5 mg total) by mouth 3 (three) times daily as needed. for anxiety 03/28/16   [name], MD  Cyanocobalamin (B-12 PO) Take by mouth daily.      Historical Provider, MD  dicyclomine (BENTYL) 20 MG tablet TAKE 1/2 TABLET BY MOUTH THREE TIMES DAILY BEFORE MEALS 11/06/15   [name], MD  dicyclomine (BENTYL) 20 MG tablet TAKE 1/2  TABLET BY MOUTH THREE TIMES DAILY BEFORE MEALS 05/27/16   [name], MD  diltiazem [facility]) 120 MG 24 hr capsule TAKE 1 CAPSULE BY MOUTH EVERY DAY 02/29/16   [name], MD  dorzolamide (TRUSOPT) 2 % ophthalmic solution Place into both eyes 2 (two) times daily.  01/13/14   Historical Provider, MD  escitalopram (LEXAPRO) 10 MG tablet Take 1 tablet (10 mg total) by mouth daily. 01/17/16   [name], MD  fluticasone furoate-vilanterol (BREO ELLIPTA) 100-25 MCG/INH AEPB Inhale 1 puff into the lungs daily.    Historical Provider, MD  furosemide (LASIX) 40 MG tablet TAKE 1 TABLET BY MOUTH EVERY DAY 04/29/16   [name], MD  ibuprofen (ADVIL,MOTRIN) 800 MG tablet Take 1 tablet (800 mg total) by mouth daily as needed for moderate pain. 05/18/15   [name], MD  KLOR-CON 8 MEQ tablet TAKE 1 TABLET BY MOUTH ONCE DAILY 05/01/15   [name], MD  latanoprost (XALATAN) 0.005 % ophthalmic solution Place 1 drop into both eyes daily. 10/22/12   Jacques Navy, MD  levalbuterol Pauline Aus) 1.25 MG/3ML nebulizer solution Take 1.25 mg by nebulization as needed. Patient not taking: Reported on 03/28/2016 10/22/12   Jacques Navy, MD  losartan (COZAAR) 100 MG tablet TAKE 1 TABLET BY MOUTH EVERY DAY 04/29/16   [name], MD  multivitamin [facility]) tablet Take 1 tablet by mouth daily.      Historical Provider, MD  omeprazole (PRILOSEC) 40 MG capsule TAKE ONE CAPSULE BY MOUTH EVERY DAY 04/29/16   [name], MD  predniSONE (STERAPRED UNI-PAK 21 TAB) 10 MG (21) TBPK tablet Take 1 tablet (10 mg total) by mouth daily. Take 6-5-4-3-2-1 po qd 05/31/16   Deatra Canter, FNP  SUMAtriptan (IMITREX) 100 MG tablet Take 1 tablet (100 mg total) by mouth once. Take 1 tablet at onset of headache 03/08/16 03/28/16  [name], MD   Meds Ordered and Administered this Visit   Medications  methylPREDNISolone acetate (DEPO-MEDROL) injection 80 mg  (not administered)    BP 168/72 (BP Location: Right Arm)   Pulse 78   Temp 98.6 F (37 C) (Oral)   Resp 18   SpO2 99%  No data found.   Physical Exam  Constitutional: She appears well-developed and well-nourished.  HENT:  Head: Normocephalic and atraumatic.  Right Ear: External ear normal.  Left Ear: External ear normal.  Mouth/Throat: Oropharynx is clear and moist.  Eyes: Conjunctivae and EOM are normal. Pupils are equal, round, and reactive to light.  Neck: Normal range of motion. Neck supple.  Cardiovascular: Normal rate, regular rhythm and normal heart sounds.   Pulmonary/Chest: Effort normal. She has wheezes.  Abdominal: Soft. Bowel sounds are normal.  Nursing note and vitals reviewed.   Urgent Care Course     Procedures (including critical care  time)  Labs Review Labs Reviewed - No data to display  Imaging Review No results found.   Visual Acuity Review  Right Eye Distance:   Left Eye Distance:   Bilateral Distance:    Right Eye Near:   Left Eye Near:    Bilateral Near:         MDM   1. Acute bronchitis, unspecified organism   2. Cough   3. Subacute maxillary sinusitis    Augmentin XR 2 po bid x 10 days #40 Depomedrol 80mg  Prednisone taper 10mg  6-5-4-3-2-1 po qd #21 Tessalon Perles 200mg  po tid prn  Continue SABA and ICS Push po fluids, rest, tylenol and motrin otc prn as directed for fever, arthralgias, and myalgias.  Follow up prn if sx's continue or persist.   Deatra Canter, FNP 05/31/16 1950

## 2016-06-03 ENCOUNTER — Other Ambulatory Visit: Payer: Self-pay | Admitting: Internal Medicine

## 2016-06-12 DIAGNOSIS — J4530 Mild persistent asthma, uncomplicated: Secondary | ICD-10-CM | POA: Diagnosis not present

## 2016-06-12 DIAGNOSIS — J019 Acute sinusitis, unspecified: Secondary | ICD-10-CM | POA: Diagnosis not present

## 2016-06-12 DIAGNOSIS — J300 Vasomotor rhinitis: Secondary | ICD-10-CM | POA: Diagnosis not present

## 2016-06-12 DIAGNOSIS — J209 Acute bronchitis, unspecified: Secondary | ICD-10-CM | POA: Diagnosis not present

## 2016-07-09 DIAGNOSIS — H33311 Horseshoe tear of retina without detachment, right eye: Secondary | ICD-10-CM | POA: Diagnosis not present

## 2016-07-24 ENCOUNTER — Other Ambulatory Visit: Payer: Self-pay | Admitting: Internal Medicine

## 2016-07-31 ENCOUNTER — Other Ambulatory Visit: Payer: Self-pay | Admitting: Internal Medicine

## 2016-08-17 ENCOUNTER — Other Ambulatory Visit: Payer: Self-pay | Admitting: Internal Medicine

## 2016-08-31 ENCOUNTER — Other Ambulatory Visit: Payer: Self-pay | Admitting: Internal Medicine

## 2016-09-03 ENCOUNTER — Ambulatory Visit (INDEPENDENT_AMBULATORY_CARE_PROVIDER_SITE_OTHER): Payer: PPO | Admitting: Internal Medicine

## 2016-09-03 ENCOUNTER — Encounter: Payer: Self-pay | Admitting: Internal Medicine

## 2016-09-03 DIAGNOSIS — N644 Mastodynia: Secondary | ICD-10-CM | POA: Diagnosis not present

## 2016-09-03 DIAGNOSIS — J4531 Mild persistent asthma with (acute) exacerbation: Secondary | ICD-10-CM | POA: Diagnosis not present

## 2016-09-03 MED ORDER — PREDNISONE 20 MG PO TABS: 40.0000 mg | ORAL_TABLET | Freq: Every day | ORAL | 0 refills | Status: DC

## 2016-09-03 NOTE — Progress Notes (Signed)
Pre visit review using our clinic review tool, if applicable. No additional management support is needed unless otherwise documented below in the visit note. 

## 2016-09-03 NOTE — Assessment & Plan Note (Signed)
With flare and rx for prednisone today for wheezing on exam. Still she will continue breo and albuterol. If worsens she will return for visit.

## 2016-09-03 NOTE — Assessment & Plan Note (Signed)
No LAD on exam and no worrisome breast lumps palpable. No nipple discharge or skin changes. Small vascular cord noted on exam. Will monitor for shrinking and if going away will monitor. If growing or pain increases will order US breast.

## 2016-09-03 NOTE — Patient Instructions (Addendum)
This is likely a blood vessel and it should go away.   If this starts hurting more or growing we can check an ultrasound of the area.   We have sent in the prednisone for the breathing. Take 2 pills daily for the next 5 days to get ahead of the pollen.

## 2016-09-03 NOTE — Progress Notes (Signed)
   Subjective:    Patient ID: Dominique Johnston, female    DOB: 11-17-1945, 71 y.o.   MRN: [id number]  HPI The patient is a 71 YO female coming in for some left breast pain. She was on a trip and doing some lifting for some time about 2 weeks ago. She has been having soreness since that time. She has a small spot with some hardness. She has a prior biopsy in that breast. Mammogram up to date and no problems on the last. She denies skin changes. Overall the area is shrinking and pain decreasing over the last 1-2 weeks since onset. Has not tried anything for it except ibuprofen and heat.  She is also concerned about asthma flare. She was exposed to a lot of allergens during the trip as well as pollen. She is still using breo daily and albuterol inhaler more often. No fevers or chills. Some wheezing and SOB on exertion. She is worried that this will continue and worsen as it is already 4-5 days in and no improvement. She is taking her allergy medication zyrtec daily and has not missed any doses.   Review of Systems  Constitutional: Positive for activity change. Negative for appetite change, chills, fatigue, fever and unexpected weight change.  HENT: Positive for congestion, postnasal drip and rhinorrhea. Negative for ear discharge, ear pain, sinus pain, sinus pressure, sore throat and trouble swallowing.   Eyes: Negative.   Respiratory: Positive for cough, chest tightness, shortness of breath and wheezing.   Cardiovascular: Negative.   Gastrointestinal: Negative.   Musculoskeletal: Positive for myalgias. Negative for arthralgias, back pain, neck pain and neck stiffness.  Skin: Negative.   Neurological: Negative.   Hematological: Negative.       Objective:   Physical Exam  Constitutional: She is oriented to person, place, and time. She appears well-developed and well-nourished.  HENT:  Head: Normocephalic and atraumatic.  Right Ear: External ear normal.  Left Ear: External ear normal.    Oropharynx with redness and clear drainage, nose without crusting.  Eyes: EOM are normal.  Neck: Normal range of motion. No JVD present.  Cardiovascular: Normal rate and regular rhythm.   Pulmonary/Chest: No respiratory distress. She has wheezes. She has no rales. She exhibits tenderness.  Left breast exam with small palpable vascular cord, no suspicious lumps, no regional or axillary LAD.   Abdominal: Soft.  Musculoskeletal: She exhibits no edema.  Lymphadenopathy:    She has no cervical adenopathy.  Neurological: She is alert and oriented to person, place, and time.  Skin: Skin is warm and dry.  Psychiatric: She has a normal mood and affect.   Vitals:   09/03/16 0950  BP: (!) 158/90  Pulse: 80  Resp: 12  Temp: 98.4 F (36.9 C)  TempSrc: Oral  SpO2: 99%  Weight: 209 lb (94.8 kg)  Height:  (1.676 m)      Assessment & Plan:

## 2016-09-04 DIAGNOSIS — M1711 Unilateral primary osteoarthritis, right knee: Secondary | ICD-10-CM | POA: Diagnosis not present

## 2016-09-11 DIAGNOSIS — M170 Bilateral primary osteoarthritis of knee: Secondary | ICD-10-CM | POA: Diagnosis not present

## 2016-09-18 DIAGNOSIS — M1711 Unilateral primary osteoarthritis, right knee: Secondary | ICD-10-CM | POA: Diagnosis not present

## 2016-09-25 DIAGNOSIS — M1711 Unilateral primary osteoarthritis, right knee: Secondary | ICD-10-CM | POA: Diagnosis not present

## 2016-10-02 DIAGNOSIS — M1711 Unilateral primary osteoarthritis, right knee: Secondary | ICD-10-CM | POA: Diagnosis not present

## 2016-10-08 ENCOUNTER — Ambulatory Visit (INDEPENDENT_AMBULATORY_CARE_PROVIDER_SITE_OTHER): Payer: PPO

## 2016-10-08 ENCOUNTER — Ambulatory Visit (HOSPITAL_COMMUNITY)
Admission: EM | Admit: 2016-10-08 | Discharge: 2016-10-08 | Disposition: A | Discharge status: Home / Self Care | Payer: PPO | Attending: Emergency Medicine | Admitting: Emergency Medicine

## 2016-10-08 ENCOUNTER — Encounter (HOSPITAL_COMMUNITY): Payer: Self-pay | Admitting: Emergency Medicine

## 2016-10-08 DIAGNOSIS — J40 Bronchitis, not specified as acute or chronic: Secondary | ICD-10-CM

## 2016-10-08 DIAGNOSIS — J4521 Mild intermittent asthma with (acute) exacerbation: Secondary | ICD-10-CM

## 2016-10-08 DIAGNOSIS — J4520 Mild intermittent asthma, uncomplicated: Secondary | ICD-10-CM | POA: Diagnosis present

## 2016-10-08 DIAGNOSIS — R05 Cough: Secondary | ICD-10-CM | POA: Diagnosis not present

## 2016-10-08 MED ORDER — PREDNISONE 20 MG PO TABS: 40.0000 mg | ORAL_TABLET | Freq: Every day | ORAL | 0 refills | Status: DC

## 2016-10-08 MED ORDER — DOXYCYCLINE HYCLATE 100 MG PO CAPS: 100.0000 mg | ORAL_CAPSULE | Freq: Two times a day (BID) | ORAL | 0 refills | Status: DC

## 2016-10-08 MED ORDER — ACETAMINOPHEN 325 MG PO TABS
ORAL_TABLET | ORAL | Status: AC
  Filled 2016-10-08: qty 2

## 2016-10-08 MED ORDER — ACETAMINOPHEN 325 MG PO TABS
650.0000 mg | ORAL_TABLET | Freq: Once | ORAL | Status: AC
  Administered 2016-10-08: 650 mg via ORAL

## 2016-10-08 NOTE — ED Triage Notes (Signed)
Patient has asthma, has cough and chest congestion and having chills.

## 2016-10-08 NOTE — ED Provider Notes (Signed)
CSN: [id number]     Arrival date & time 10/08/16  1804 History   None    Chief Complaint  Patient presents with  . Asthma   (Consider location/radiation/quality/duration/timing/severity/associated sxs/prior Treatment) The history is provided by the patient. No language interpreter was used.  Asthma  This is a recurrent problem. The current episode started more than 2 days ago. The problem occurs constantly. The problem has not changed since onset.Pertinent negatives include no chest pain, no abdominal pain, no headaches and no shortness of breath. Nothing aggravates the symptoms. Nothing relieves the symptoms. She has tried rest for the symptoms. The treatment provided no relief.    Past Medical History:  Diagnosis Date  . Allergy    rhinitis  . Asthma   . Bouchard nodes (DJD hand)   . Bronchitis    recurrent  . Depression   . DJD (degenerative joint disease) of knee   . DJD (degenerative joint disease), lumbosacral   . GERD (gastroesophageal reflux disease)   . Headache(784.0)   . Headache(784.0)   . Hyperlipidemia   . Hypertension   . IBS (irritable bowel syndrome)   . Sinusitis, chronic    Past Surgical History:  Procedure Laterality Date  . BREAST LUMPECTOMY  1998   benign; left breast  . ORIF ANKLE FRACTURE  '07   Left ankle  . SEPTOPLASTY  2000  . TONSILLECTOMY    . transnasal laryngoscopy  2005  . TUBAL LIGATION  1977   Family History  Problem Relation Age of Onset  . Hypertension Father   . Coronary artery disease Father        MI/ Fatal  . Breast cancer Neg Hx   . Diabetes Neg Hx   . Colon cancer Neg Hx    Social History  Substance Use Topics  . Smoking status: Never Smoker  . Smokeless tobacco: Never Used  . Alcohol use No   OB History    No data available     Review of Systems  Constitutional: Positive for chills and fever.  HENT: Negative.   Eyes: Negative.   Respiratory: Positive for cough. Negative for shortness of breath.    Cardiovascular: Negative for chest pain and palpitations.  Gastrointestinal: Negative.  Negative for abdominal pain.  Endocrine: Negative.   Genitourinary: Negative.   Musculoskeletal: Negative.   Skin: Negative.   Allergic/Immunologic: Positive for environmental allergies.  Neurological: Negative.  Negative for headaches.  Hematological: Negative.   Psychiatric/Behavioral: Negative.   All other systems reviewed and are negative.   Allergies  Enalapril maleate and Sulfamethoxazole-trimethoprim  Home Medications   Prior to Admission medications   Medication Sig Start Date End Date Taking? Authorizing Provider  albuterol (PROVENTIL HFA;VENTOLIN HFA) 108 (90 Base) MCG/ACT inhaler Inhale 2 puffs into the lungs every 6 (six) hours as needed. 03/28/16   [name], MD  Ascorbic Acid (VITAMIN C PO) Take 1 tablet by mouth daily.      [provider]  Earlie Server 528-41 MCG/INH AEPB INHALE 1 PUFF PO QD 02/27/16   [provider]  Cetirizine HCl (ZYRTEC PO) Take by mouth daily.    [provider]  clonazePAM (KLONOPIN) 0.5 MG tablet Take 1 tablet (0.5 mg total) by mouth 3 (three) times daily as needed. for anxiety 03/28/16   [name], MD  Cyanocobalamin (B-12 PO) Take by mouth daily.      [provider]  dicyclomine (BENTYL) 20 MG tablet TAKE 1/2 TABLET BY MOUTH THREE TIMES  DAILY BEFORE MEALS 11/06/15   [name], MD  diltiazem [facility]) 120 MG 24 hr capsule TAKE 1 CAPSULE BY MOUTH EVERY DAY 06/03/16   [name], MD  dorzolamide (TRUSOPT) 2 % ophthalmic solution Place into both eyes 2 (two) times daily.  01/13/14   [provider]  doxycycline (VIBRAMYCIN) 100 MG capsule Take 1 capsule (100 mg total) by mouth 2 (two) times daily. 10/08/16   Dakia Schifano, Para March, NP  escitalopram (LEXAPRO) 10 MG tablet TAKE 1 TABLET(10 MG) BY MOUTH DAILY 07/24/16   [name], MD  fluticasone furoate-vilanterol  (BREO ELLIPTA) 100-25 MCG/INH AEPB Inhale 1 puff into the lungs daily.    [provider]  furosemide (LASIX) 40 MG tablet TAKE 1 TABLET BY MOUTH EVERY DAY 07/24/16   [name], MD  ibuprofen (ADVIL,MOTRIN) 800 MG tablet TAKE 1 TABLET(800 MG) BY MOUTH DAILY AS NEEDED FOR MODERATE PAIN 09/02/16   [name], MD  latanoprost (XALATAN) 0.005 % ophthalmic solution Place 1 drop into both eyes daily. 10/22/12   Norins, Rosalyn Gess, MD  levalbuterol Pauline Aus) 1.25 MG/3ML nebulizer solution Take 1.25 mg by nebulization as needed. 10/22/12   Norins, Rosalyn Gess, MD  losartan (COZAAR) 100 MG tablet TAKE 1 TABLET BY MOUTH EVERY DAY 07/24/16   [name], MD  multivitamin [facility]) tablet Take 1 tablet by mouth daily.      [provider]  omeprazole (PRILOSEC) 40 MG capsule TAKE ONE CAPSULE BY MOUTH EVERY DAY 07/24/16   [name], MD  potassium chloride (KLOR-CON) 8 MEQ tablet TAKE 1 TABLET BY MOUTH EVERY DAY 06/03/16   [name], MD  predniSONE (DELTASONE) 20 MG tablet Take 2 tablets (40 mg total) by mouth daily with breakfast. 10/08/16   Danen Lapaglia, Para March, NP  SUMAtriptan (IMITREX) 100 MG tablet Take 1 tablet (100 mg total) by mouth once. Take 1 tablet at onset of headache 03/08/16 03/28/16  [name], MD   Meds Ordered and Administered this Visit   Medications  acetaminophen (TYLENOL) tablet 650 mg (650 mg Oral Given 10/08/16 1947)    BP (!) 165/80 (BP Location: Left Arm) Comment: rn notified  Pulse 89   Temp (!) 100.8 F (38.2 C) (Oral) Comment: notified rn  Resp 16   SpO2 97%  No data found.   Physical Exam  Constitutional: She is oriented to person, place, and time. She appears well-developed and well-nourished. She is active and cooperative. No distress.  HENT:  Head: Normocephalic.  Mouth/Throat: Oropharynx is clear and moist.  Eyes: Conjunctivae, EOM and lids are normal. Pupils are equal, round, and reactive  to light.  Neck: Normal range of motion. No tracheal deviation present.  Cardiovascular: Regular rhythm, normal heart sounds and normal pulses.   No murmur heard. Pulmonary/Chest: Effort normal. No respiratory distress. She has decreased breath sounds in the right upper field, the right lower field, the left upper field and the left lower field.  Abdominal: Soft. Bowel sounds are normal. There is no tenderness.  Musculoskeletal: Normal range of motion.  Lymphadenopathy:    She has no cervical adenopathy.  Neurological: She is alert and oriented to person, place, and time. GCS eye subscore is 4. GCS verbal subscore is 5. GCS motor subscore is 6.  Skin: Skin is warm and dry. No rash noted.  Psychiatric: She has a normal mood and affect. Her speech is normal and behavior is normal.  Nursing note and vitals reviewed.   Urgent Care  Course     Procedures (including critical care time)  Labs Review Labs Reviewed - No data to display  Imaging Review Dg Chest 2 View  Result Date: 10/08/2016 CLINICAL DATA:  71 y/o  F; 2 days of cough and fever. EXAM: CHEST  2 VIEW COMPARISON:  07/08/2015 chest radiograph FINDINGS: Normal cardiac silhouette. Aortic atherosclerosis with calcification. Clear lungs. No pleural effusion or pneumothorax. Mild degenerative changes of the thoracic spine. IMPRESSION: No acute pulmonary process identified. Electronically Signed   By: Mitzi HansenLance  Furusawa-Stratton M.D.   On: 10/08/2016 19:42          MDM   1. Mild intermittent asthma with acute exacerbation   2. Bronchitis     1920:Tylenol 650mg  po ordered for fever, CXR pa and lateral ordered for cough/fever.   2015: Your cxr was negative for pneumonia, treating with doxycycline and prednisone for asthma/bronchitis given exam and fever. Pt encouraged to drink plenty of water. Follow up with PCP, go to ER for new or worsening issues. Return to UC as needed.  Pt verbalized understanding to this provider.     Clancy Gourdefelice, Skyeler Scalese, NP 10/08/16 2125

## 2016-10-08 NOTE — Discharge Instructions (Signed)
Your cxr was negative for pneumonia, treating with doxycycline and prednisone for asthma/bronchitis given exam and fever. Pt encouraged to drink plenty of water. Follow up with PCP, go to ER for new or worsening issues. Return to UC as needed.

## 2016-10-18 ENCOUNTER — Other Ambulatory Visit: Payer: Self-pay | Admitting: Internal Medicine

## 2016-10-18 NOTE — Telephone Encounter (Signed)
Last filled 03/28/2016, given 90 with 3 refills to take 3 time daily as needed (generalized anxiety disorder on problem list ) Lat seen 09/03/2016 for asthma and went to ED last month

## 2016-10-18 NOTE — Telephone Encounter (Signed)
Faxed script back to [facility],,,/lmb

## 2016-10-24 ENCOUNTER — Other Ambulatory Visit: Payer: Self-pay | Admitting: Internal Medicine

## 2016-11-18 DIAGNOSIS — H401212 Low-tension glaucoma, right eye, moderate stage: Secondary | ICD-10-CM | POA: Diagnosis not present

## 2016-11-18 DIAGNOSIS — H2513 Age-related nuclear cataract, bilateral: Secondary | ICD-10-CM | POA: Diagnosis not present

## 2016-11-18 DIAGNOSIS — H25043 Posterior subcapsular polar age-related cataract, bilateral: Secondary | ICD-10-CM | POA: Diagnosis not present

## 2016-11-20 DIAGNOSIS — M1712 Unilateral primary osteoarthritis, left knee: Secondary | ICD-10-CM | POA: Diagnosis not present

## 2016-11-27 DIAGNOSIS — M1712 Unilateral primary osteoarthritis, left knee: Secondary | ICD-10-CM | POA: Diagnosis not present

## 2016-12-01 ENCOUNTER — Other Ambulatory Visit: Payer: Self-pay | Admitting: Internal Medicine

## 2016-12-04 DIAGNOSIS — M1712 Unilateral primary osteoarthritis, left knee: Secondary | ICD-10-CM | POA: Diagnosis not present

## 2017-01-14 DIAGNOSIS — H33322 Round hole, left eye: Secondary | ICD-10-CM | POA: Diagnosis not present

## 2017-01-14 DIAGNOSIS — H43813 Vitreous degeneration, bilateral: Secondary | ICD-10-CM | POA: Diagnosis not present

## 2017-01-14 DIAGNOSIS — H33301 Unspecified retinal break, right eye: Secondary | ICD-10-CM | POA: Diagnosis not present

## 2017-01-17 ENCOUNTER — Other Ambulatory Visit: Payer: Self-pay | Admitting: Family

## 2017-01-22 ENCOUNTER — Other Ambulatory Visit: Payer: Self-pay | Admitting: Internal Medicine

## 2017-01-22 ENCOUNTER — Other Ambulatory Visit: Payer: Self-pay | Admitting: Family

## 2017-02-13 ENCOUNTER — Other Ambulatory Visit: Payer: Self-pay | Admitting: Internal Medicine

## 2017-02-13 DIAGNOSIS — Z1231 Encounter for screening mammogram for malignant neoplasm of breast: Secondary | ICD-10-CM

## 2017-02-26 ENCOUNTER — Other Ambulatory Visit: Payer: Self-pay | Admitting: Family

## 2017-02-26 ENCOUNTER — Other Ambulatory Visit: Payer: Self-pay | Admitting: Internal Medicine

## 2017-02-27 ENCOUNTER — Other Ambulatory Visit: Payer: Self-pay | Admitting: Internal Medicine

## 2017-03-11 ENCOUNTER — Ambulatory Visit
Admission: RE | Admit: 2017-03-11 | Discharge: 2017-03-11 | Disposition: A | Discharge status: Home / Self Care | Payer: PPO | Source: Ambulatory Visit | Attending: Internal Medicine | Admitting: Internal Medicine

## 2017-03-11 DIAGNOSIS — Z1231 Encounter for screening mammogram for malignant neoplasm of breast: Secondary | ICD-10-CM

## 2017-03-16 ENCOUNTER — Ambulatory Visit (HOSPITAL_COMMUNITY)
Admission: EM | Admit: 2017-03-16 | Discharge: 2017-03-16 | Disposition: A | Discharge status: Home / Self Care | Payer: PPO | Attending: Internal Medicine | Admitting: Internal Medicine

## 2017-03-16 ENCOUNTER — Encounter (HOSPITAL_COMMUNITY): Payer: Self-pay | Admitting: Emergency Medicine

## 2017-03-16 DIAGNOSIS — S46911A Strain of unspecified muscle, fascia and tendon at shoulder and upper arm level, right arm, initial encounter: Secondary | ICD-10-CM | POA: Diagnosis not present

## 2017-03-16 MED ORDER — NAPROXEN 375 MG PO TABS: 375.0000 mg | ORAL_TABLET | Freq: Two times a day (BID) | ORAL | 0 refills | Status: DC

## 2017-03-16 NOTE — ED Triage Notes (Signed)
Pt reports she slipped and fell yest onto carpet and inj her right shoulder/arm  Pt reports she's able to move arm around but just wants to make sure everything is ok  Denies head inj/LOC  A&O x4... NAD... Ambulatory

## 2017-03-16 NOTE — Discharge Instructions (Addendum)
No danger signs on exam of right shoulder today.  Excellent range of motion, suspect muscular injury/bruise.  Ice for 5-10 minutes a couple times daily will help with swelling/pain/inflammation.  Prescription for naproxen sent to pharmacy of record, [facility] on [address] at [address].  Anticipate gradual improvement in pain/stiffness over the next 7-10 days.  May feel a little worse yet tomorrrow before improvement starts.  Physical therapy can be helpful for persistent shoulder/neck pain.

## 2017-03-16 NOTE — ED Provider Notes (Signed)
[facility]    CSN: [id number] Arrival date & time: 03/16/17  1341     History   Chief Complaint Chief Complaint  Patient presents with  . Fall    HPI Dominique Johnston is a 71 y.o. female.  she was caring for 60-year-old granddaughter last evening, got tangled up in something, and fell sideways landing on her right hip and shoulder. This morning, her right shoulder is kind of stiff and sore. She just wants to be sure nothing is broken. She was able to walk into the urgent care independently. She is noticing a lot of pain when she tries to put her heavy pocketbook on her right shoulder.    HPI  Past Medical History:  Diagnosis Date  . Allergy    rhinitis  . Asthma   . Bouchard nodes (DJD hand)   . Bronchitis    recurrent  . Depression   . DJD (degenerative joint disease) of knee   . DJD (degenerative joint disease), lumbosacral   . GERD (gastroesophageal reflux disease)   . Headache(784.0)   . Headache(784.0)   . Hyperlipidemia   . Hypertension   . IBS (irritable bowel syndrome)   . Sinusitis, chronic     Patient Active Problem List   Diagnosis Date Noted  . Mild intermittent asthma with acute exacerbation 10/08/2016  . Breast pain, left 09/03/2016  . Upper airway cough syndrome 04/19/2015  . Morbid obesity (HCC) 04/05/2015  . Fatigue 03/16/2015  . Routine health maintenance 10/25/2012  . Borderline high cholesterol 01/19/2010  . G E REFLUX 12/10/2007  . Generalized anxiety disorder 12/04/2007  . Asthma 10/27/2007  . Essential hypertension 12/01/2006  . Allergic rhinitis 12/01/2006  . IRRITABLE BOWEL SYNDROME 12/01/2006  . Osteoarthritis 12/01/2006    Past Surgical History:  Procedure Laterality Date  . BREAST EXCISIONAL BIOPSY    . BREAST LUMPECTOMY  1998   benign; left breast  . ORIF ANKLE FRACTURE  '07   Left ankle  . SEPTOPLASTY  2000  . TONSILLECTOMY    . transnasal laryngoscopy  2005  . TUBAL LIGATION  1977     Home Medications     Prior to Admission medications   Medication Sig Start Date End Date Taking? Authorizing Provider  Ascorbic Acid (VITAMIN C PO) Take 1 tablet by mouth daily.     Yes [provider]  Earlie Server 200-25 MCG/INH AEPB INHALE 1 PUFF PO QD 02/27/16  Yes [provider]  Cetirizine HCl (ZYRTEC PO) Take by mouth daily.   Yes [provider]  clonazePAM (KLONOPIN) 0.5 MG tablet TAKE 1 TABLET BY MOUTH THREE TIMES DAILY AS NEEDED FOR ANXIETY 10/18/16  Yes Veryl Speak, FNP  Cyanocobalamin (B-12 PO) Take by mouth daily.     Yes [provider]  dicyclomine (BENTYL) 20 MG tablet TAKE 1/2 TABLET BY MOUTH THREE TIMES DAILY BEFORE MEALS 11/06/15  Yes [name], MD  dicyclomine (BENTYL) 20 MG tablet TAKE 1/2 TABLET BY MOUTH THREE TIMES DAILY BEFORE MEALS 02/27/17  Yes [name], MD  diltiazem [facility]) 120 MG 24 hr capsule Take 1 capsule (120 mg total) by mouth daily. Keep 03/31/2017 appointment for further refills 02/28/17  Yes [name], MD  dorzolamide (TRUSOPT) 2 % ophthalmic solution Place into both eyes 2 (two) times daily.  01/13/14  Yes [provider]  escitalopram (LEXAPRO) 10 MG tablet Take 1 tablet (10 mg total) by mouth daily. Need office visit for further refills 01/23/17  Yes Myrlene Brokerrawford, Elizabeth A, MD  fluticasone furoate-vilanterol (BREO ELLIPTA) 100-25 MCG/INH AEPB Inhale 1 puff into the lungs daily.   Yes [provider]  furosemide (LASIX) 40 MG tablet Take 1 tablet (40 mg total) by mouth daily. Need office visit for further refills 01/23/17  Yes Myrlene Brokerrawford, Elizabeth A, MD  latanoprost (XALATAN) 0.005 % ophthalmic solution Place 1 drop into both eyes daily. 10/22/12  Yes Norins, Rosalyn GessMichael E, MD  levalbuterol Pauline Aus(XOPENEX) 1.25 MG/3ML nebulizer solution Take 1.25 mg by nebulization as needed. 10/22/12  Yes Norins, Rosalyn GessMichael E, MD  losartan (COZAAR) 100 MG tablet Take 1 tablet (100 mg total) by mouth daily. Need office  visit for further refills 01/23/17  Yes Myrlene Brokerrawford, Elizabeth A, MD  multivitamin [facility](THERAGRAN) tablet Take 1 tablet by mouth daily.     Yes [provider]  omeprazole (PRILOSEC) 40 MG capsule TAKE 1 CAPSULE BY MOUTH EVERY DAY 01/17/17  Yes Myrlene Brokerrawford, Elizabeth A, MD  potassium chloride (KLOR-CON) 8 MEQ tablet TAKE 1 TABLET BY MOUTH EVERY DAY 06/03/16  Yes Myrlene Brokerrawford, Elizabeth A, MD  potassium chloride (KLOR-CON) 8 MEQ tablet TAKE 1 TABLET(8 MEQ) BY MOUTH DAILY 02/27/17  Yes Myrlene Brokerrawford, Elizabeth A, MD  albuterol (PROVENTIL HFA;VENTOLIN HFA) 108 (90 Base) MCG/ACT inhaler Inhale 2 puffs into the lungs every 6 (six) hours as needed. 03/28/16   Myrlene Brokerrawford, Elizabeth A, MD  ibuprofen (ADVIL,MOTRIN) 800 MG tablet TAKE 1 TABLET(800 MG) BY MOUTH DAILY AS NEEDED FOR MODERATE PAIN 09/02/16   Myrlene Brokerrawford, Elizabeth A, MD  naproxen (NAPROSYN) 375 MG tablet Take 1 tablet (375 mg total) 2 (two) times daily by mouth. 03/16/17   Eustace MooreMurray, Mylin Gignac W, MD  SUMAtriptan (IMITREX) 100 MG tablet Take 1 tablet (100 mg total) by mouth once. Take 1 tablet at onset of headache 03/08/16 03/28/16  Myrlene Brokerrawford, Elizabeth A, MD    Family History Family History  Problem Relation Age of Onset  . Hypertension Father   . Coronary artery disease Father        MI/ Fatal  . Breast cancer Neg Hx   . Diabetes Neg Hx   . Colon cancer Neg Hx     Social History Social History   Tobacco Use  . Smoking status: Never Smoker  . Smokeless tobacco: Never Used  Substance Use Topics  . Alcohol use: No  . Drug use: No     Allergies   Enalapril maleate and Sulfamethoxazole-trimethoprim   Review of Systems Review of Systems  All other systems reviewed and are negative.    Physical Exam Triage Vital Signs ED Triage Vitals  Enc Vitals Group     BP 03/16/17 1431 (!) 147/76     Pulse Rate 03/16/17 1431 67     Resp 03/16/17 1431 20     Temp 03/16/17 1431 98.2 F (36.8 C)     Temp Source 03/16/17 1431 Oral     SpO2 03/16/17 1431 98 %      Weight --      Height --      Pain Score 03/16/17 1432 5     Pain Loc --    Updated Vital Signs BP (!) 147/76 (BP Location: Left Arm)   Pulse 67   Temp 98.2 F (36.8 C) (Oral)   Resp 20   SpO2 98%  Physical Exam  Constitutional: She is oriented to person, place, and time. No distress.  Alert, nicely groomed  HENT:  Head: Atraumatic.  Eyes:  Conjugate gaze, no eye redness/drainage  Neck: Neck supple.  Cardiovascular: Normal rate.  Pulmonary/Chest: No respiratory distress.  Lungs clear, symmetric breath sounds  Abdominal: She exhibits no distension.  Musculoskeletal:  No leg swelling Able to fully extend both arms at the shoulders overhead, internally and externally rotate. Most discomfort occurs in the right shoulder and the [facility] joint when she reaches across the body with her right arm. No bruise. No focal tenderness of the clavicle. Mild tenderness at the lateral tip of the acromion Able to extend neck and flex forward, rotate right and left. Little bit of discomfort with right neck rotation Mild spasm of the right trapezius  Neurological: She is alert and oriented to person, place, and time.  Skin: Skin is warm and dry.  No cyanosis  Nursing note and vitals reviewed.    UC Treatments / Results   Procedures Procedures (including critical care time) None today  Final Clinical Impressions(s) / UC Diagnoses   Final diagnoses:  Right shoulder strain, initial encounter   Meds ordered this encounter  Medications  . naproxen (NAPROSYN) 375 MG tablet    Sig: Take 1 tablet (375 mg total) 2 (two) times daily by mouth.    Dispense:  20 tablet    Refill:  0   No danger signs on exam of right shoulder today.  Excellent range of motion, suspect muscular injury/bruise.  Ice for 5-10 minutes a couple times daily will help with swelling/pain/inflammation.  Prescription for naproxen sent to pharmacy of record, [facility] on [address] at [employer].  Anticipate gradual  improvement in pain/stiffness over the next 7-10 days.  May feel a little worse yet tomorrrow before improvement starts.  Physical therapy can be helpful for persistent shoulder/neck pain.     Controlled Substance Prescriptions [place] Controlled Substance Registry consulted? Not Applicable   Eustace Moore, MD 03/17/17 2003

## 2017-03-28 NOTE — Progress Notes (Signed)
Subjective:   Dominique Johnston is a 71 y.o. female who presents for Medicare Annual (Subsequent) preventive examination.  Review of Systems:  No ROS.  Medicare Wellness Visit. Additional risk factors are reflected in the social history.  Cardiac Risk Factors include: advanced age (>8mn, >>32women);hypertension;obesity (BMI >30kg/m2) Sleep patterns: gets up 1-2 times nightly to void and sleeps 7-8 hours nightly.    Home Safety/Smoke Alarms: Feels safe in home. Smoke alarms in place.  Living environment; residence and Firearm Safety: 1-story house/ trailer, no firearms Lives with family, no needs for DME, good support system. Seat Belt Safety/Bike Helmet: Wears seat belt.     Objective:     Vitals: BP (!) 142/90   Pulse 67   Temp 98.2 F (36.8 C)   Resp 20   Ht '5\' 6"'$  (1.676 m)   Wt 204 lb (92.5 kg)   SpO2 99%   BMI 32.93 kg/m   Body mass index is 32.93 kg/m.   Tobacco Social History   Tobacco Use  Smoking Status Never Smoker  Smokeless Tobacco Never Used     Counseling given: Not Answered   Past Medical History:  Diagnosis Date  . Allergy    rhinitis  . Asthma   . Bouchard nodes (DJD hand)   . Bronchitis    recurrent  . Depression   . DJD (degenerative joint disease) of knee   . DJD (degenerative joint disease), lumbosacral   . GERD (gastroesophageal reflux disease)   . Headache(784.0)   . Headache(784.0)   . Hyperlipidemia   . Hypertension   . IBS (irritable bowel syndrome)   . Sinusitis, chronic    Past Surgical History:  Procedure Laterality Date  . BREAST EXCISIONAL BIOPSY    . BREAST LUMPECTOMY  1998   benign; left breast  . ORIF ANKLE FRACTURE  '07   Left ankle  . SEPTOPLASTY  2000  . TONSILLECTOMY    . transnasal laryngoscopy  2005  . TUBAL LIGATION  1977   Family History  Problem Relation Age of Onset  . Hypertension Father   . Coronary artery disease Father        MI/ Fatal  . Breast cancer Neg Hx   . Diabetes Neg Hx   . Colon  cancer Neg Hx    Social History   Substance and Sexual Activity  Sexual Activity Not Currently    Outpatient Encounter Medications as of 03/31/2017  Medication Sig  . albuterol (PROVENTIL HFA;VENTOLIN HFA) 108 (90 Base) MCG/ACT inhaler Inhale 2 puffs into the lungs every 6 (six) hours as needed.  . Ascorbic Acid (VITAMIN C PO) Take 1 tablet by mouth daily.    .Marland KitchenBREO ELLIPTA 200-25 MCG/INH AEPB INHALE 1 PUFF PO QD  . Cetirizine HCl (ZYRTEC PO) Take by mouth daily.  . clonazePAM (KLONOPIN) 0.5 MG tablet TAKE 1 TABLET BY MOUTH THREE TIMES DAILY AS NEEDED FOR ANXIETY  . Cyanocobalamin (B-12 PO) Take by mouth daily.    .Marland Kitchendicyclomine (BENTYL) 20 MG tablet TAKE 1/2 TABLET BY MOUTH THREE TIMES DAILY BEFORE MEALS  . diltiazem (TIAZAC) 120 MG 24 hr capsule Take 1 capsule (120 mg total) by mouth daily. Keep 03/31/2017 appointment for further refills  . dorzolamide (TRUSOPT) 2 % ophthalmic solution Place into both eyes 2 (two) times daily.   .Marland Kitchenescitalopram (LEXAPRO) 10 MG tablet Take 1 tablet (10 mg total) by mouth daily. Need office visit for further refills  . furosemide (LASIX) 40 MG tablet Take 1  tablet (40 mg total) by mouth daily. Need office visit for further refills  . guaiFENesin (ROBITUSSIN) 100 MG/5ML liquid Take 200 mg 3 (three) times daily as needed by mouth for cough.  Marland Kitchen ibuprofen (ADVIL,MOTRIN) 800 MG tablet TAKE 1 TABLET(800 MG) BY MOUTH DAILY AS NEEDED FOR MODERATE PAIN  . latanoprost (XALATAN) 0.005 % ophthalmic solution Place 1 drop into both eyes daily.  Marland Kitchen levalbuterol (XOPENEX) 1.25 MG/3ML nebulizer solution Take 1.25 mg by nebulization as needed.  Marland Kitchen losartan (COZAAR) 100 MG tablet Take 1 tablet (100 mg total) by mouth daily. Need office visit for further refills  . multivitamin (THERAGRAN) tablet Take 1 tablet by mouth daily.    Marland Kitchen omeprazole (PRILOSEC) 40 MG capsule TAKE 1 CAPSULE BY MOUTH EVERY DAY  . potassium chloride (KLOR-CON) 8 MEQ tablet TAKE 1 TABLET BY MOUTH EVERY DAY    . SUMAtriptan (IMITREX) 100 MG tablet Take 1 tablet (100 mg total) by mouth once. Take 1 tablet at onset of headache  . [DISCONTINUED] dicyclomine (BENTYL) 20 MG tablet TAKE 1/2 TABLET BY MOUTH THREE TIMES DAILY BEFORE MEALS  . [DISCONTINUED] fluticasone furoate-vilanterol (BREO ELLIPTA) 100-25 MCG/INH AEPB Inhale 1 puff into the lungs daily.  . [DISCONTINUED] naproxen (NAPROSYN) 375 MG tablet Take 1 tablet (375 mg total) 2 (two) times daily by mouth. (Patient not taking: Reported on 03/31/2017)  . [DISCONTINUED] potassium chloride (KLOR-CON) 8 MEQ tablet TAKE 1 TABLET(8 MEQ) BY MOUTH DAILY   No facility-administered encounter medications on file as of 03/31/2017.     Activities of Daily Living In your present state of health, do you have any difficulty performing the following activities: 03/31/2017  Hearing? N  Vision? N  Difficulty concentrating or making decisions? N  Walking or climbing stairs? N  Dressing or bathing? N  Doing errands, shopping? N  Preparing Food and eating ? N  Using the Toilet? N  In the past six months, have you accidently leaked urine? N  Do you have problems with loss of bowel control? N  Managing your Medications? N  Managing your Finances? N  Housekeeping or managing your Housekeeping? N  Some recent data might be hidden    Patient Care Team: Hoyt Koch, MD as PCP - General (Internal Medicine) Mosetta Anis, MD (Allergy) Romine, Lubertha South, MD (Obstetrics and Gynecology)    Assessment:    Physical assessment deferred to PCP.  Exercise Activities and Dietary recommendations Current Exercise Habits: Home exercise routine, Type of exercise: calisthenics;stretching, Time (Minutes): 30, Frequency (Times/Week): 3, Weekly Exercise (Minutes/Week): 90, Intensity: Mild, Exercise limited by: orthopedic condition(s)  Diet (meal preparation, eat out, water intake, caffeinated beverages, dairy products, fruits and vegetables): in general, a "healthy"  diet  , well balanced   Reviewed heart healthy diet, encouraged patient to increase daily water intake.  Goals    . Patient Stated     I would like to remove the clutter from my house. I will continue to do what I can to relax and decrease my stress level. I am going to start to go to the [facility].     . Weight < 200 lb (90.719 kg)     Lose 7 lbs; more movement; having more energy; try water and try tai chi/ Based on pulmonary and may seek guidance from pulmonologist as she feels tried and gets sob      Fall Risk Fall Risk  03/31/2017 03/28/2016 02/27/2015  Falls in the past year? Yes No No  Injury with Fall?  No - -   Depression Screen PHQ 2/9 Scores 03/31/2017 03/28/2016 01/17/2016 02/27/2015  PHQ - 2 Score 2 0 5 1  PHQ- 9 Score 2 - 9 -     Cognitive Function MMSE - Mini Mental State Exam 02/27/2015  Not completed: (No Data)       Ad8 score reviewed for issues:  Issues making decisions: no  Less interest in hobbies / activities: no  Repeats questions, stories (family complaining): no  Trouble using ordinary gadgets (microwave, computer, phone):no  Forgets the month or year: no  Mismanaging finances: no  Remembering appts: no  Daily problems with thinking and/or memory: no Ad8 score is= 0  Immunization History  Administered Date(s) Administered  . Influenza Split 04/05/2011  . Influenza Whole 04/22/2007, 02/23/2008, 02/21/2009, 03/06/2009, 02/28/2010, 01/26/2014  . Influenza, High Dose Seasonal PF 03/06/2017  . Influenza,inj,Quad PF,6+ Mos 01/17/2016  . Influenza-Unspecified 02/21/2015  . Pneumococcal Conjugate-13 03/16/2015  . Pneumococcal Polysaccharide-23 05/29/2005, 04/05/2011  . Zoster 07/26/2009   Screening Tests Health Maintenance  Topic Date Due  . TETANUS/TDAP  07/13/2018  . MAMMOGRAM  03/12/2019  . COLONOSCOPY  12/25/2022  . INFLUENZA VACCINE  Completed  . DEXA SCAN  Completed  . Hepatitis C Screening  Completed  . PNA vac Low Risk  Adult  Completed      Plan:    Continue doing brain stimulating activities (puzzles, reading, adult coloring books, staying active) to keep memory sharp.   Continue to eat heart healthy diet (full of fruits, vegetables, whole grains, lean protein, water--limit salt, fat, and sugar intake) and increase physical activity as tolerated.  I have personally reviewed and noted the following in the patient's chart:   . Medical and social history . Use of alcohol, tobacco or illicit drugs  . Current medications and supplements . Functional ability and status . Nutritional status . Physical activity . Advanced directives . List of other physicians . Vitals . Screenings to include cognitive, depression, and falls . Referrals and appointments  In addition, I have reviewed and discussed with patient certain preventive protocols, quality metrics, and best practice recommendations. A written personalized care plan for preventive services as well as general preventive health recommendations were provided to patient.     Michiel Cowboy, RN  03/31/2017

## 2017-03-31 ENCOUNTER — Ambulatory Visit (INDEPENDENT_AMBULATORY_CARE_PROVIDER_SITE_OTHER): Payer: PPO | Admitting: Internal Medicine

## 2017-03-31 ENCOUNTER — Encounter: Payer: Self-pay | Admitting: Internal Medicine

## 2017-03-31 ENCOUNTER — Other Ambulatory Visit (INDEPENDENT_AMBULATORY_CARE_PROVIDER_SITE_OTHER): Payer: PPO

## 2017-03-31 VITALS — BP 142/90 | HR 67 | Temp 98.2°F | Resp 20 | Ht 66.0 in | Wt 204.0 lb

## 2017-03-31 DIAGNOSIS — F411 Generalized anxiety disorder: Secondary | ICD-10-CM | POA: Diagnosis not present

## 2017-03-31 DIAGNOSIS — I10 Essential (primary) hypertension: Secondary | ICD-10-CM

## 2017-03-31 DIAGNOSIS — J4520 Mild intermittent asthma, uncomplicated: Secondary | ICD-10-CM

## 2017-03-31 DIAGNOSIS — Z0000 Encounter for general adult medical examination without abnormal findings: Secondary | ICD-10-CM | POA: Diagnosis not present

## 2017-03-31 LAB — COMPREHENSIVE METABOLIC PANEL
ALT: 11 U/L (ref 0–35)
AST: 18 U/L (ref 0–37)
Albumin: 4 g/dL (ref 3.5–5.2)
Alkaline Phosphatase: 81 U/L (ref 39–117)
BUN: 13 mg/dL (ref 6–23)
CO2: 27 mEq/L (ref 19–32)
Calcium: 9.3 mg/dL (ref 8.4–10.5)
Chloride: 105 mEq/L (ref 96–112)
Creatinine, Ser: 1.03 mg/dL (ref 0.40–1.20)
GFR: 67.88 mL/min (ref >60.00)
Glucose, Bld: 88 mg/dL (ref 70–99)
Potassium: 3.8 mEq/L (ref 3.5–5.1)
Sodium: 140 mEq/L (ref 135–145)
Total Bilirubin: 0.6 mg/dL (ref 0.2–1.2)
Total Protein: 7.2 g/dL (ref 6.0–8.3)

## 2017-03-31 LAB — LIPID PANEL
Cholesterol: 195 mg/dL (ref 0–200)
HDL: 52.5 mg/dL (ref >39.00)
LDL Cholesterol: 124 mg/dL — ABNORMAL HIGH (ref 0–99)
NonHDL: 142.54
Total CHOL/HDL Ratio: 4
Triglycerides: 91 mg/dL (ref 0.0–149.0)
VLDL: 18.2 mg/dL (ref 0.0–40.0)

## 2017-03-31 LAB — CBC
HCT: 40.5 % (ref 36.0–46.0)
Hemoglobin: 12.5 g/dL (ref 12.0–15.0)
MCHC: 30.9 g/dL (ref 30.0–36.0)
MCV: 74.1 fl — ABNORMAL LOW (ref 78.0–100.0)
Platelets: 211 10*3/uL (ref 150.0–400.0)
RBC: 5.47 Mil/uL — ABNORMAL HIGH (ref 3.87–5.11)
RDW: 15.3 % (ref 11.5–15.5)
WBC: 4.9 10*3/uL (ref 4.0–10.5)

## 2017-03-31 LAB — TSH: TSH: 2.17 u[IU]/mL (ref 0.35–4.50)

## 2017-03-31 NOTE — Assessment & Plan Note (Signed)
Albuterol prn and no flare today. 

## 2017-03-31 NOTE — Progress Notes (Signed)
   Subjective:    Patient ID: Dominique Johnston, female    DOB: 05-12-1946, 71 y.o.   MRN: [id number]  HPI The patient is a 71 YO female coming in for physical.   [facility] reviewed and updated.   Review of Systems  Constitutional: Negative.   HENT: Negative.   Eyes: Negative.   Respiratory: Negative for cough, chest tightness and shortness of breath.   Cardiovascular: Negative for chest pain, palpitations and leg swelling.  Gastrointestinal: Negative for abdominal distention, abdominal pain, constipation, diarrhea, nausea and vomiting.  Musculoskeletal: Negative.   Skin: Negative.   Neurological: Negative.   Psychiatric/Behavioral: Negative.       Objective:   Physical Exam  Constitutional: She is oriented to person, place, and time. She appears well-developed and well-nourished.  HENT:  Head: Normocephalic and atraumatic.  Eyes: EOM are normal.  Neck: Normal range of motion.  Cardiovascular: Normal rate and regular rhythm.  Pulmonary/Chest: Effort normal and breath sounds normal. No respiratory distress. She has no wheezes. She has no rales.  Abdominal: Soft. Bowel sounds are normal. She exhibits no distension. There is no tenderness. There is no rebound.  Musculoskeletal: She exhibits no edema.  Neurological: She is alert and oriented to person, place, and time. Coordination normal.  Skin: Skin is warm and dry.  Psychiatric: She has a normal mood and affect.   Vitals:   03/31/17 0936  BP: (!) 142/90  Pulse: 67  Resp: 20  Temp: 98.2 F (36.8 C)  SpO2: 99%  Weight: 204 lb (92.5 kg)  Height: 5\' 6"  (1.676 m)      Assessment & Plan:

## 2017-03-31 NOTE — Assessment & Plan Note (Signed)
Flu, pneumonia, tetanus up to date. Mammogram and bone density up to date. Counseled about sun safety and mole surveillance. Colonoscopy up to date. Given 10 year screening recommendations.

## 2017-03-31 NOTE — Assessment & Plan Note (Signed)
Lexapro and clonazepam prn. She is aware of the risk of long term usage including memory changes and fall risk.

## 2017-03-31 NOTE — Progress Notes (Signed)
Patient ID: Dominique Johnston, female   DOB: [birth year], 71 y.o.   MRN: [id number] Medical screening examination/treatment/procedure(s) were performed by non-physician practitioner and as supervising physician I was immediately available for consultation/collaboration. I agree with above. Myrlene BrokerElizabeth A Bradey Luzier, MD

## 2017-03-31 NOTE — Patient Instructions (Addendum)
Continue doing brain stimulating activities (puzzles, reading, adult coloring books, staying active) to keep memory sharp.   Continue to eat heart healthy diet (full of fruits, vegetables, whole grains, lean protein, water--limit salt, fat, and sugar intake) and increase physical activity as tolerated.   Dominique Johnston , Thank you for taking time to come for your Medicare Wellness Visit. I appreciate your ongoing commitment to your health goals. Please review the following plan we discussed and let me know if I can assist you in the future.   These are the goals we discussed: Goals    . Patient Stated     I would like to remove the clutter from my house. I will continue to do what I can to relax and decrease my stress level. I am going to start to go to the [facility].     . Weight < 200 lb (90.719 kg)     Lose 7 lbs; more movement; having more energy; try water and try tai chi/ Based on pulmonary and may seek guidance from pulmonologist as she feels tried and gets sob       This is a list of the screening recommended for you and due dates:  Health Maintenance  Topic Date Due  . Tetanus Vaccine  07/13/2018  . Mammogram  03/12/2019  . Colon Cancer Screening  12/25/2022  . Flu Shot  Completed  . DEXA scan (bone density measurement)  Completed  .  Hepatitis C: One time screening is recommended by Center for Disease Control  (CDC) for  adults born from [birth year] through 1965.   Completed  . Pneumonia vaccines  Completed

## 2017-03-31 NOTE — Assessment & Plan Note (Signed)
Controlled on diltizem, losartan, lasix. Checking CMP and adjust as needed.

## 2017-04-08 ENCOUNTER — Other Ambulatory Visit: Payer: Self-pay | Admitting: Internal Medicine

## 2017-04-08 MED ORDER — DICYCLOMINE HCL 20 MG PO TABS: ORAL_TABLET | ORAL | 2 refills | Status: DC

## 2017-04-13 ENCOUNTER — Ambulatory Visit (HOSPITAL_COMMUNITY)
Admission: EM | Admit: 2017-04-13 | Discharge: 2017-04-13 | Disposition: A | Discharge status: Home / Self Care | Payer: PPO | Attending: Physician Assistant | Admitting: Physician Assistant

## 2017-04-13 ENCOUNTER — Encounter (HOSPITAL_COMMUNITY): Payer: Self-pay | Admitting: *Deleted

## 2017-04-13 ENCOUNTER — Ambulatory Visit (INDEPENDENT_AMBULATORY_CARE_PROVIDER_SITE_OTHER): Payer: PPO

## 2017-04-13 ENCOUNTER — Other Ambulatory Visit: Payer: Self-pay

## 2017-04-13 DIAGNOSIS — J40 Bronchitis, not specified as acute or chronic: Secondary | ICD-10-CM

## 2017-04-13 DIAGNOSIS — R05 Cough: Secondary | ICD-10-CM | POA: Diagnosis not present

## 2017-04-13 MED ORDER — PREDNISONE 10 MG PO TABS: 40.0000 mg | ORAL_TABLET | Freq: Every day | ORAL | 0 refills | Status: AC

## 2017-04-13 MED ORDER — AZITHROMYCIN 250 MG PO TABS: 250.0000 mg | ORAL_TABLET | Freq: Every day | ORAL | 0 refills | Status: DC

## 2017-04-13 NOTE — ED Provider Notes (Signed)
[facility]    CSN: [id number] Arrival date & time: 04/13/17  1545     History   Chief Complaint Chief Complaint  Patient presents with  . Nasal Congestion  . Cough    HPI Dominique Johnston is a 71 y.o. female.   71 year-old female, with history of allergy, headache, HTN, asthma, presenting today due to nasal congestion, cough and wheezing. She states that she has history of asthma and gets bronchitis frequently. These symptoms started about 5 days ago. She states that she typically gets prednisone and zpak and this helps. No fever, chills, chest pain or shortness of breath    The history is provided by the patient.  Cough  Cough characteristics:  Productive Sputum characteristics:  Green Severity:  Moderate Onset quality:  Gradual Duration:  5 days Timing:  Constant Progression:  Unchanged Chronicity:  New Smoker: no   Context: not animal exposure, not exposure to allergens, not fumes, not occupational exposure, not sick contacts and not smoke exposure   Relieved by:  Nothing Worsened by:  Nothing Ineffective treatments:  None tried Associated symptoms: sinus congestion and wheezing   Associated symptoms: no chest pain, no chills, no diaphoresis, no ear fullness, no ear pain, no eye discharge, no fever, no headaches, no myalgias, no rash, no rhinorrhea, no shortness of breath, no sore throat and no weight loss   Risk factors: no chemical exposure, no recent infection and no recent travel     Past Medical History:  Diagnosis Date  . Allergy    rhinitis  . Asthma   . Bouchard nodes (DJD hand)   . Bronchitis    recurrent  . Depression   . DJD (degenerative joint disease) of knee   . DJD (degenerative joint disease), lumbosacral   . GERD (gastroesophageal reflux disease)   . Headache(784.0)   . Headache(784.0)   . Hyperlipidemia   . Hypertension   . IBS (irritable bowel syndrome)   . Sinusitis, chronic     Patient Active Problem List   Diagnosis  Date Noted  . Mild intermittent asthma 10/08/2016  . Routine health maintenance 10/25/2012  . Borderline high cholesterol 01/19/2010  . G E REFLUX 12/10/2007  . Generalized anxiety disorder 12/04/2007  . Essential hypertension 12/01/2006  . Allergic rhinitis 12/01/2006  . IRRITABLE BOWEL SYNDROME 12/01/2006  . Osteoarthritis 12/01/2006    Past Surgical History:  Procedure Laterality Date  . BREAST EXCISIONAL BIOPSY    . BREAST LUMPECTOMY  1998   benign; left breast  . ORIF ANKLE FRACTURE  '07   Left ankle  . SEPTOPLASTY  2000  . TONSILLECTOMY    . transnasal laryngoscopy  2005  . TUBAL LIGATION  1977    OB History    No data available       Home Medications    Prior to Admission medications   Medication Sig Start Date End Date Taking? Authorizing Provider  albuterol (PROVENTIL HFA;VENTOLIN HFA) 108 (90 Base) MCG/ACT inhaler Inhale 2 puffs into the lungs every 6 (six) hours as needed. 03/28/16  Yes [name], MD  Ascorbic Acid (VITAMIN C PO) Take 1 tablet by mouth daily.     Yes [provider]  Earlie Server 200-25 MCG/INH AEPB INHALE 1 PUFF PO QD 02/27/16  Yes [provider]  Cetirizine HCl (ZYRTEC PO) Take by mouth daily.   Yes [provider]  clonazePAM (KLONOPIN) 0.5 MG tablet TAKE 1 TABLET BY MOUTH THREE TIMES DAILY AS NEEDED FOR  ANXIETY 10/18/16  Yes Dominique Speakalone, Gregory D, FNP  Cyanocobalamin (B-12 PO) Take by mouth daily.     Yes [provider]  dicyclomine (BENTYL) 20 MG tablet TAKE 1/2 TABLET BY MOUTH THREE TIMES DAILY BEFORE MEALS 04/08/17  Yes Myrlene Brokerrawford, Elizabeth A, MD  diltiazem [facility](TIAZAC) 120 MG 24 hr capsule Take 1 capsule (120 mg total) by mouth daily. Keep 03/31/2017 appointment for further refills 02/28/17  Yes Myrlene Brokerrawford, Elizabeth A, MD  dorzolamide (TRUSOPT) 2 % ophthalmic solution Place into both eyes 2 (two) times daily.  01/13/14  Yes [provider]  escitalopram (LEXAPRO) 10 MG tablet Take 1 tablet (10  mg total) by mouth daily. Need office visit for further refills 01/23/17  Yes Myrlene Brokerrawford, Elizabeth A, MD  furosemide (LASIX) 40 MG tablet Take 1 tablet (40 mg total) by mouth daily. Need office visit for further refills 01/23/17  Yes Myrlene Brokerrawford, Elizabeth A, MD  ibuprofen (ADVIL,MOTRIN) 800 MG tablet TAKE 1 TABLET(800 MG) BY MOUTH DAILY AS NEEDED FOR MODERATE PAIN 09/02/16  Yes Myrlene Brokerrawford, Elizabeth A, MD  latanoprost (XALATAN) 0.005 % ophthalmic solution Place 1 drop into both eyes daily. 10/22/12  Yes Norins, Rosalyn GessMichael E, MD  losartan (COZAAR) 100 MG tablet Take 1 tablet (100 mg total) by mouth daily. Need office visit for further refills 01/23/17  Yes Myrlene Brokerrawford, Elizabeth A, MD  multivitamin [facility](THERAGRAN) tablet Take 1 tablet by mouth daily.     Yes [provider]  omeprazole (PRILOSEC) 40 MG capsule TAKE 1 CAPSULE BY MOUTH EVERY DAY 01/17/17  Yes Myrlene Brokerrawford, Elizabeth A, MD  potassium chloride (KLOR-CON) 8 MEQ tablet TAKE 1 TABLET BY MOUTH EVERY DAY 06/03/16  Yes Myrlene Brokerrawford, Elizabeth A, MD  azithromycin (ZITHROMAX) 250 MG tablet Take 1 tablet (250 mg total) by mouth daily. Take first 2 tablets together, then 1 every day until finished. 04/13/17   Emilio Baylock C, PA-C  guaiFENesin (ROBITUSSIN) 100 MG/5ML liquid Take 200 mg 3 (three) times daily as needed by mouth for cough.    [provider]  levalbuterol Pauline Aus(XOPENEX) 1.25 MG/3ML nebulizer solution Take 1.25 mg by nebulization as needed. 10/22/12   Norins, Rosalyn GessMichael E, MD  predniSONE (DELTASONE) 10 MG tablet Take 4 tablets (40 mg total) by mouth daily for 5 days. 04/13/17 04/18/17  Gaelyn Tukes C, PA-C  SUMAtriptan (IMITREX) 100 MG tablet Take 1 tablet (100 mg total) by mouth once. Take 1 tablet at onset of headache 03/08/16 03/28/16  Myrlene Brokerrawford, Elizabeth A, MD    Family History Family History  Problem Relation Age of Onset  . Hypertension Father   . Coronary artery disease Father        MI/ Fatal  . Breast cancer Neg Hx   . Diabetes Neg Hx   . Colon  cancer Neg Hx     Social History Social History   Tobacco Use  . Smoking status: Never Smoker  . Smokeless tobacco: Never Used  Substance Use Topics  . Alcohol use: Not on file  . Drug use: Not on file     Allergies   Enalapril maleate and Sulfamethoxazole-trimethoprim   Review of Systems Review of Systems  Constitutional: Negative for chills, diaphoresis, fever and weight loss.  HENT: Negative for ear pain, rhinorrhea and sore throat.   Eyes: Negative for pain, discharge and visual disturbance.  Respiratory: Positive for cough and wheezing. Negative for shortness of breath.   Cardiovascular: Negative for chest pain and palpitations.  Gastrointestinal: Negative for abdominal pain and vomiting.  Genitourinary: Negative for dysuria  and hematuria.  Musculoskeletal: Negative for arthralgias, back pain and myalgias.  Skin: Negative for color change and rash.  Neurological: Negative for seizures, syncope and headaches.  All other systems reviewed and are negative.    Physical Exam Triage Vital Signs ED Triage Vitals  Enc Vitals Group     BP 04/13/17 1701 (!) 157/77     Pulse Rate 04/13/17 1701 69     Resp 04/13/17 1701 16     Temp 04/13/17 1701 98.7 F (37.1 C)     Temp Source 04/13/17 1701 Oral     SpO2 04/13/17 1701 97 %     Weight --      Height --      Head Circumference --      Peak Flow --      Pain Score 04/13/17 1703 0     Pain Loc --      Pain Edu? --      Excl. in GC? --    No data found.  Updated Vital Signs BP (!) 157/77   Pulse 69   Temp 98.7 F (37.1 C) (Oral)   Resp 16   SpO2 97%   Visual Acuity Right Eye Distance:   Left Eye Distance:   Bilateral Distance:    Right Eye Near:   Left Eye Near:    Bilateral Near:     Physical Exam  Constitutional: She appears well-developed and well-nourished. No distress.  HENT:  Head: Normocephalic and atraumatic.  Right Ear: Hearing, tympanic membrane, external ear and ear canal normal.  Left  Ear: Hearing, tympanic membrane, external ear and ear canal normal.  Nose: Nose normal.  Mouth/Throat: Oropharynx is clear and moist. No oropharyngeal exudate, posterior oropharyngeal edema, posterior oropharyngeal erythema or tonsillar abscesses.  Eyes: Conjunctivae are normal.  Neck: Neck supple.  Cardiovascular: Normal rate and regular rhythm.  No murmur heard. Pulmonary/Chest: Effort normal. No stridor. No respiratory distress. She has wheezes in the right upper field, the right middle field, the right lower field, the left upper field, the left middle field and the left lower field. She has no rhonchi. She has no rales.  Abdominal: Soft. There is no tenderness.  Musculoskeletal: She exhibits no edema.  Neurological: She is alert.  Skin: Skin is warm and dry.  Psychiatric: She has a normal mood and affect.  Nursing note and vitals reviewed.    UC Treatments / Results  Labs (all labs ordered are listed, but only abnormal results are displayed) Labs Reviewed - No data to display  EKG  EKG Interpretation None       Radiology Dg Chest 2 View  Result Date: 04/13/2017 CLINICAL DATA:  Cough and fever for 2 days. EXAM: CHEST  2 VIEW COMPARISON:  10/08/2016 and prior exam FINDINGS: Upper limits normal heart size again noted. There is no evidence of focal airspace disease, pulmonary edema, suspicious pulmonary nodule/mass, pleural effusion, or pneumothorax. No acute bony abnormalities are identified. IMPRESSION: No active cardiopulmonary disease. Electronically Signed   By: Harmon PierJeffrey  Hu M.D.   On: 04/13/2017 17:59    Procedures Procedures (including critical care time)  Medications Ordered in UC Medications - No data to display   Initial Impression / Assessment and Plan / UC Course  I have reviewed the triage vital signs and the nursing notes.  Pertinent labs & imaging results that were available during my care of the patient were reviewed by me and considered in my medical  decision making (see chart for details).  Normal CXR - will treat for bronchitis with prednisone and Zpak  Final Clinical Impressions(s) / UC Diagnoses   Final diagnoses:  Bronchitis    ED Discharge Orders        Ordered    predniSONE (DELTASONE) 10 MG tablet  Daily     04/13/17 1804    azithromycin (ZITHROMAX) 250 MG tablet  Daily     04/13/17 1804       Controlled Substance Prescriptions [place] Controlled Substance Registry consulted? Not Applicable   Alecia Lemming, [place] 04/13/17 1610

## 2017-04-13 NOTE — ED Triage Notes (Signed)
C/O nasal congestion, productive cough (worsening over past 2 days), epistaxis x 4-5 days.  C/O "rattling in chest" with hx asthma.  C/O "low-grade" fevers.  Has been using albuterol HFA and her normal inhalers, Mucinex, and antipyretics, saline nasal rinses.

## 2017-04-16 ENCOUNTER — Other Ambulatory Visit: Payer: Self-pay | Admitting: Internal Medicine

## 2017-04-18 DIAGNOSIS — K219 Gastro-esophageal reflux disease without esophagitis: Secondary | ICD-10-CM | POA: Diagnosis not present

## 2017-04-18 DIAGNOSIS — J019 Acute sinusitis, unspecified: Secondary | ICD-10-CM | POA: Diagnosis not present

## 2017-04-18 DIAGNOSIS — J300 Vasomotor rhinitis: Secondary | ICD-10-CM | POA: Diagnosis not present

## 2017-04-18 DIAGNOSIS — J4530 Mild persistent asthma, uncomplicated: Secondary | ICD-10-CM | POA: Diagnosis not present

## 2017-04-26 ENCOUNTER — Other Ambulatory Visit: Payer: Self-pay | Admitting: Internal Medicine

## 2017-05-18 ENCOUNTER — Other Ambulatory Visit: Payer: Self-pay | Admitting: Internal Medicine

## 2017-05-29 ENCOUNTER — Other Ambulatory Visit: Payer: Self-pay | Admitting: Internal Medicine

## 2017-07-23 DIAGNOSIS — H43813 Vitreous degeneration, bilateral: Secondary | ICD-10-CM | POA: Diagnosis not present

## 2017-07-23 DIAGNOSIS — H33322 Round hole, left eye: Secondary | ICD-10-CM | POA: Diagnosis not present

## 2017-07-23 DIAGNOSIS — H33301 Unspecified retinal break, right eye: Secondary | ICD-10-CM | POA: Diagnosis not present

## 2017-07-23 DIAGNOSIS — H35371 Puckering of macula, right eye: Secondary | ICD-10-CM | POA: Diagnosis not present

## 2017-07-26 ENCOUNTER — Other Ambulatory Visit: Payer: Self-pay | Admitting: Internal Medicine

## 2017-07-28 ENCOUNTER — Other Ambulatory Visit: Payer: Self-pay | Admitting: Internal Medicine

## 2017-07-29 NOTE — Telephone Encounter (Signed)
Control database checked last refill: 10/18/2016 LOV: 03/31/2017

## 2017-08-05 ENCOUNTER — Other Ambulatory Visit: Payer: Self-pay | Admitting: Internal Medicine

## 2017-08-07 DIAGNOSIS — H401232 Low-tension glaucoma, bilateral, moderate stage: Secondary | ICD-10-CM | POA: Diagnosis not present

## 2017-08-22 ENCOUNTER — Ambulatory Visit: Payer: PPO | Admitting: Internal Medicine

## 2017-08-22 DIAGNOSIS — Z0289 Encounter for other administrative examinations: Secondary | ICD-10-CM

## 2017-08-25 ENCOUNTER — Encounter: Payer: Self-pay | Admitting: Internal Medicine

## 2017-08-25 ENCOUNTER — Ambulatory Visit (INDEPENDENT_AMBULATORY_CARE_PROVIDER_SITE_OTHER): Payer: PPO | Admitting: Internal Medicine

## 2017-08-25 DIAGNOSIS — J4521 Mild intermittent asthma with (acute) exacerbation: Secondary | ICD-10-CM | POA: Diagnosis not present

## 2017-08-25 MED ORDER — ESCITALOPRAM OXALATE 10 MG PO TABS
20.0000 mg | ORAL_TABLET | Freq: Every day | ORAL | 2 refills | Status: DC
Start: 2017-08-25 — End: 2018-04-01

## 2017-08-25 MED ORDER — PREDNISONE 20 MG PO TABS: 40.0000 mg | ORAL_TABLET | Freq: Every day | ORAL | 0 refills | Status: DC

## 2017-08-25 NOTE — Progress Notes (Signed)
   Subjective:    Patient ID: Dominique Johnston, female    DOB: March 08, 1946, 72 y.o.   MRN: [id number]  HPI The patient is a 72 YO female coming in for congestion and coughing. Started about 2 weeks ago. Overall stable to mild worsening. Some SOB. Cough is non-productive. She is having some wheezing which she is hearing. Denies headaches or sinus pressure. Some congestion and drainage. Denies sore throat. Taking otc allergy medicine as well as breo and mucninex. She is drinking more liquids. No chills or fevers.   Review of Systems  Constitutional: Positive for activity change and appetite change. Negative for chills, fatigue, fever and unexpected weight change.  HENT: Positive for congestion, postnasal drip, rhinorrhea and sinus pressure. Negative for ear discharge, ear pain, sinus pain, sneezing, sore throat, tinnitus, trouble swallowing and voice change.   Eyes: Negative.   Respiratory: Positive for cough, shortness of breath and wheezing. Negative for chest tightness.   Cardiovascular: Negative.   Gastrointestinal: Negative.   Neurological: Negative.       Objective:   Physical Exam  Constitutional: She is oriented to person, place, and time. She appears well-developed and well-nourished.  HENT:  Head: Normocephalic and atraumatic.  Oropharynx with redness and clear drainage, nose with swollen turbinates, TMs normal bilaterally  Eyes: EOM are normal.  Neck: Normal range of motion. No thyromegaly present.  Cardiovascular: Normal rate and regular rhythm.  Pulmonary/Chest: Effort normal. No respiratory distress. She has wheezes. She has no rales.  Mild expiratory wheezing bilaterally.   Abdominal: Soft.  Musculoskeletal: She exhibits tenderness.  Lymphadenopathy:    She has no cervical adenopathy.  Neurological: She is alert and oriented to person, place, and time.  Skin: Skin is warm and dry.   Vitals:   08/25/17 1027 08/25/17 1055  BP: (!) 152/100 120/70  Pulse: 69   Temp: 98.1  F (36.7 C)   TempSrc: Oral   SpO2: 99%   Weight: 207 lb (93.9 kg)   Height: 5\' 6"  (1.676 m)       Assessment & Plan:

## 2017-08-25 NOTE — Assessment & Plan Note (Signed)
With flare, rx for prednisone. Will avoid antibiotics for now. Call back in 2-3 days if no improvement and antibiotic will be prescribed. Continue breo and albuterol as needed.

## 2017-08-25 NOTE — Patient Instructions (Signed)
We have sent in the prednisone to take 2 pills daily for 7 days.   If you do not feel better by Thursday call us back.   We have sent in the higher dose of lexapro so you can take 2 pills daily to help more.

## 2017-08-28 ENCOUNTER — Telehealth: Payer: Self-pay | Admitting: Internal Medicine

## 2017-08-28 MED ORDER — AZITHROMYCIN 250 MG PO TABS: ORAL_TABLET | ORAL | 0 refills | Status: DC

## 2017-08-28 NOTE — Telephone Encounter (Signed)
Sent in azithromycin

## 2017-08-28 NOTE — Telephone Encounter (Signed)
Patient informed. 

## 2017-08-28 NOTE — Telephone Encounter (Signed)
Copied from CRM [phone]. Topic: Quick Communication - See Telephone Encounter >> Aug 28, 2017  8:54 AM Windy KalataMichael, Seriah Brotzman L, NT wrote: CRM for notification. See Telephone encounter for: 08/28/17.  Patient is calling and was seen on 08/25/17. Was prescribed predniSONE (DELTASONE) 20 MG tablet and was told if she did not feel better by 08/28/17 to call the office so that a antibiotic could be called in. Patient states she is still not feeling better.  [facility] [phone] [place]  - [address] [facility] [address] [place] [id number] Phone: [phone] Fax: [phone]

## 2017-09-12 ENCOUNTER — Other Ambulatory Visit: Payer: Self-pay | Admitting: Internal Medicine

## 2017-09-25 ENCOUNTER — Ambulatory Visit (HOSPITAL_COMMUNITY)
Admission: EM | Admit: 2017-09-25 | Discharge: 2017-09-25 | Disposition: A | Discharge status: Home / Self Care | Payer: PPO | Attending: Family Medicine | Admitting: Family Medicine

## 2017-09-25 ENCOUNTER — Encounter (HOSPITAL_COMMUNITY): Payer: Self-pay | Admitting: Emergency Medicine

## 2017-09-25 ENCOUNTER — Ambulatory Visit (INDEPENDENT_AMBULATORY_CARE_PROVIDER_SITE_OTHER): Payer: PPO

## 2017-09-25 ENCOUNTER — Other Ambulatory Visit: Payer: Self-pay

## 2017-09-25 DIAGNOSIS — M79672 Pain in left foot: Secondary | ICD-10-CM | POA: Diagnosis not present

## 2017-09-25 DIAGNOSIS — S99922A Unspecified injury of left foot, initial encounter: Secondary | ICD-10-CM | POA: Diagnosis not present

## 2017-09-25 DIAGNOSIS — M25571 Pain in right ankle and joints of right foot: Secondary | ICD-10-CM | POA: Diagnosis not present

## 2017-09-25 NOTE — ED Triage Notes (Signed)
Pt reports twisting her right ankle in the yard and falling.  In the process she states she may have twisted her right foot.  She complains of right ankle pain and swelling and pain on the top of her left foot.

## 2017-09-25 NOTE — Discharge Instructions (Addendum)
You may use over the counter ibuprofen or acetaminophen as needed.  ° °

## 2017-09-29 NOTE — ED Provider Notes (Signed)
[facility]   [id number] 09/25/17 Arrival Time: 1332  ASSESSMENT & PLAN:  1. Acute right ankle pain   2. Left foot pain     Imaging: Dg Ankle Complete Right  Result Date: 09/25/2017 CLINICAL DATA:  Status post fall with right ankle pain. EXAM: RIGHT ANKLE - COMPLETE 3+ VIEW COMPARISON:  None. FINDINGS: There is no evidence of fracture, dislocation, or joint effusion. There is no evidence of arthropathy or other focal bone abnormality. Significant soft tissue swelling about the lateral malleolus. IMPRESSION: No acute fracture or dislocation identified about the right ankle Significant soft tissues swelling about the lateral malleolus. Electronically Signed   By: Ted Mcalpine M.D.   On: 09/25/2017 15:11   Dg Foot Complete Left  Result Date: 09/25/2017 CLINICAL DATA:  Fall with left foot injury.  Initial encounter. EXAM: LEFT FOOT - COMPLETE 3+ VIEW COMPARISON:  None. FINDINGS: Negative for acute fracture or dislocation. Remote distal fibula fracture and ORIF. Hallux valgus with first MTP osteoarthritis and bunion. The sesamoids are subluxed laterally and spurred. IMPRESSION: No acute finding. Electronically Signed   By: Marnee Spring M.D.   On: 09/25/2017 15:20   Natural history and expected course discussed. Questions answered. Rest, ice, compression, elevation (RICE) therapy. Transport planner distributed. Fit with ankle brace for use over next 1 week. OTC analgesics as needed.  Reviewed expectations re: course of current medical issues. Questions answered. Outlined signs and symptoms indicating need for more acute intervention. Patient verbalized understanding. After Visit Summary given.  SUBJECTIVE: History from: patient. Dominique Johnston is a 72 y.o. female who reports persistent mild to moderate pain of her right ankle that is stable; described as aching without radiation. Onset: abrupt, today. Injury/trama: yes, reports tripping in her yard and twisting  her R ankle; able to bear weight with discomfort laterally; some swelling Relieved by: not bearing weight. Worsened by: certain movements. Associated symptoms: none reported. Extremity sensation changes or weakness: none. Self treatment: has not tried OTCs for relief of pain. History of similar: no  ROS: As per HPI.   OBJECTIVE:  Vitals:   09/25/17 1358  BP: (!) 147/84  Pulse: 72  Temp: 98.4 F (36.9 C)  TempSrc: Oral  SpO2: 97%    General appearance: alert; no distress Extremities: warm and well perfused; symmetrical with no gross deformities; poorly localized tenderness over her right lateral ankle with mild swelling and no bruising; ROM: normal but with reported discomfort CV: normal extremity capillary refill Skin: warm and dry Neurologic: normal gait; normal symmetric reflexes in all extremities; normal sensation in all extremities Psychological: alert and cooperative; normal mood and affect  Allergies  Allergen Reactions  . Enalapril Maleate     REACTION: swelling, headache  . Sulfamethoxazole-Trimethoprim     Abdominal pain, redness eyes    Past Medical History:  Diagnosis Date  . Allergy    rhinitis  . Asthma   . Bouchard nodes (DJD hand)   . Bronchitis    recurrent  . Depression   . DJD (degenerative joint disease) of knee   . DJD (degenerative joint disease), lumbosacral   . GERD (gastroesophageal reflux disease)   . Headache(784.0)   . Headache(784.0)   . Hyperlipidemia   . Hypertension   . IBS (irritable bowel syndrome)   . Sinusitis, chronic    Social History   Socioeconomic History  . Marital status: Divorced    Spouse name: Not on file  . Number of children: Not on file  . Years  of education: 30  . Highest education level: Not on file  Occupational History    Employer: [employer]    Comment: retired  Engineer, production  . Financial resource strain: Not on file  . Food insecurity:    Worry: Not on file    Inability: Not on file  .  Transportation needs:    Medical: Not on file    Non-medical: Not on file  Tobacco Use  . Smoking status: Never Smoker  . Smokeless tobacco: Never Used  Substance and Sexual Activity  . Alcohol use: Not on file  . Drug use: Not on file  . Sexual activity: Not on file  Lifestyle  . Physical activity:    Days per week: Not on file    Minutes per session: Not on file  . Stress: Not on file  Relationships  . Social connections:    Talks on phone: Not on file    Gets together: Not on file    Attends religious service: Not on file    Active member of club or organization: Not on file    Attends meetings of clubs or organizations: Not on file    Relationship status: Not on file  . Intimate partner violence:    Fear of current or ex partner: Not on file    Emotionally abused: Not on file    Physically abused: Not on file    Forced sexual activity: Not on file  Other Topics Concern  . Not on file  Social History Narrative   [name] -BS, has had additional course work.  married '71- after 29 years husband left, divorce final in '06.  1 son- '74, 1 daughter '76; 2 grandchildren. 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 Lives in her own home, daughter and grandchild home with her. ACP - HCPOA dtr - tiffany (c) [phone] [id number]. CPR - ?, no mechanical ventilation except short term care, no prolonged artificial nutrition.    Family History  Problem Relation Age of Onset  . Hypertension Father   . Coronary artery disease Father        MI/ Fatal  . Breast cancer Neg Hx   . Diabetes Neg Hx   . Colon cancer Neg Hx    Past Surgical History:  Procedure Laterality Date  . BREAST EXCISIONAL BIOPSY    . BREAST LUMPECTOMY  1998   benign; left breast  . ORIF ANKLE FRACTURE  '07   Left ankle  . SEPTOPLASTY  2000  . TONSILLECTOMY    . transnasal laryngoscopy  2005  . TUBAL LIGATION  1977      Mardella Layman, MD 09/30/17 [phone]

## 2017-10-21 DIAGNOSIS — J4530 Mild persistent asthma, uncomplicated: Secondary | ICD-10-CM | POA: Diagnosis not present

## 2017-10-21 DIAGNOSIS — K219 Gastro-esophageal reflux disease without esophagitis: Secondary | ICD-10-CM | POA: Diagnosis not present

## 2017-10-21 DIAGNOSIS — J300 Vasomotor rhinitis: Secondary | ICD-10-CM | POA: Diagnosis not present

## 2017-10-28 ENCOUNTER — Other Ambulatory Visit: Payer: Self-pay | Admitting: Internal Medicine

## 2017-11-24 ENCOUNTER — Other Ambulatory Visit: Payer: Self-pay | Admitting: Internal Medicine

## 2018-01-07 DIAGNOSIS — M170 Bilateral primary osteoarthritis of knee: Secondary | ICD-10-CM | POA: Diagnosis not present

## 2018-01-14 DIAGNOSIS — M170 Bilateral primary osteoarthritis of knee: Secondary | ICD-10-CM | POA: Diagnosis not present

## 2018-01-16 ENCOUNTER — Other Ambulatory Visit: Payer: Self-pay | Admitting: Internal Medicine

## 2018-01-20 DIAGNOSIS — H33011 Retinal detachment with single break, right eye: Secondary | ICD-10-CM | POA: Diagnosis not present

## 2018-01-20 DIAGNOSIS — H33012 Retinal detachment with single break, left eye: Secondary | ICD-10-CM | POA: Diagnosis not present

## 2018-01-20 DIAGNOSIS — H43813 Vitreous degeneration, bilateral: Secondary | ICD-10-CM | POA: Diagnosis not present

## 2018-01-20 DIAGNOSIS — H35371 Puckering of macula, right eye: Secondary | ICD-10-CM | POA: Diagnosis not present

## 2018-01-21 ENCOUNTER — Other Ambulatory Visit: Payer: Self-pay | Admitting: Internal Medicine

## 2018-01-21 DIAGNOSIS — M170 Bilateral primary osteoarthritis of knee: Secondary | ICD-10-CM | POA: Diagnosis not present

## 2018-01-28 DIAGNOSIS — M170 Bilateral primary osteoarthritis of knee: Secondary | ICD-10-CM | POA: Diagnosis not present

## 2018-02-04 DIAGNOSIS — M170 Bilateral primary osteoarthritis of knee: Secondary | ICD-10-CM | POA: Diagnosis not present

## 2018-02-12 DIAGNOSIS — H401231 Low-tension glaucoma, bilateral, mild stage: Secondary | ICD-10-CM | POA: Diagnosis not present

## 2018-02-12 DIAGNOSIS — H2513 Age-related nuclear cataract, bilateral: Secondary | ICD-10-CM | POA: Diagnosis not present

## 2018-02-12 DIAGNOSIS — H35371 Puckering of macula, right eye: Secondary | ICD-10-CM | POA: Diagnosis not present

## 2018-02-20 ENCOUNTER — Other Ambulatory Visit: Payer: Self-pay | Admitting: Internal Medicine

## 2018-03-11 ENCOUNTER — Other Ambulatory Visit: Payer: Self-pay | Admitting: Internal Medicine

## 2018-03-11 DIAGNOSIS — Z1231 Encounter for screening mammogram for malignant neoplasm of breast: Secondary | ICD-10-CM

## 2018-03-18 ENCOUNTER — Other Ambulatory Visit: Payer: Self-pay | Admitting: Internal Medicine

## 2018-03-18 ENCOUNTER — Ambulatory Visit
Admission: RE | Admit: 2018-03-18 | Discharge: 2018-03-18 | Disposition: A | Discharge status: Home / Self Care | Payer: PPO | Source: Ambulatory Visit | Attending: Internal Medicine | Admitting: Internal Medicine

## 2018-03-18 DIAGNOSIS — Z1231 Encounter for screening mammogram for malignant neoplasm of breast: Secondary | ICD-10-CM | POA: Diagnosis not present

## 2018-03-20 ENCOUNTER — Ambulatory Visit: Payer: PPO

## 2018-03-31 NOTE — Progress Notes (Signed)
Subjective:   Dominique Johnston is a 72 y.o. female who presents for Medicare Annual (Subsequent) preventive examination.  Review of Systems:  No ROS.  Medicare Wellness Visit. Additional risk factors are reflected in the social history.  Cardiac Risk Factors include: advanced age (>90mn, >>49women);dyslipidemia;hypertension Sleep patterns: does not get up to void, gets up 1-2 times nightly to void and sleeps 7 hours nightly.    Home Safety/Smoke Alarms: Feels safe in home. Smoke alarms in place.  Living environment; residence and Firearm Safety: 1-story house/ trailer, no firearms. Lives with family, no needs for DME, good support system Seat Belt Safety/Bike Helmet: Wears seat belt.      Objective:     Vitals: BP 128/82   Pulse 60   Temp 98.4 F (36.9 C)   Resp 17   Ht '5\' 6"'$  (1.676 m)   Wt 199 lb (90.3 kg)   SpO2 100%   BMI 32.12 kg/m   Body mass index is 32.12 kg/m.  Advanced Directives 04/01/2018 03/31/2017 10/08/2016 02/27/2015  Does Patient Have a Medical Advance Directive? No No No No  Does patient want to make changes to medical advance directive? Yes (ED - Information included in AVS) - - -  Would patient like information on creating a medical advance directive? - Yes (ED - Information included in AVS) - No - patient declined information    Tobacco Social History   Tobacco Use  Smoking Status Never Smoker  Smokeless Tobacco Never Used     Counseling given: Not Answered  Past Medical History:  Diagnosis Date  . Allergy    rhinitis  . Asthma   . Bouchard nodes (DJD hand)   . Bronchitis    recurrent  . Depression   . DJD (degenerative joint disease) of knee   . DJD (degenerative joint disease), lumbosacral   . GERD (gastroesophageal reflux disease)   . Headache(784.0)   . Headache(784.0)   . Hyperlipidemia   . Hypertension   . IBS (irritable bowel syndrome)   . Sinusitis, chronic    Past Surgical History:  Procedure Laterality Date  .  BREAST EXCISIONAL BIOPSY Left 1998  . ORIF ANKLE FRACTURE  '07   Left ankle  . SEPTOPLASTY  2000  . TONSILLECTOMY    . transnasal laryngoscopy  2005  . TUBAL LIGATION  1977   Family History  Problem Relation Age of Onset  . Hypertension Father   . Coronary artery disease Father        MI/ Fatal  . Breast cancer Neg Hx   . Diabetes Neg Hx   . Colon cancer Neg Hx    Social History   Socioeconomic History  . Marital status: Divorced    Spouse name: Not on file  . Number of children: 1  . Years of education: 132 . Highest education level: Not on file  Occupational History    Employer: [employer] retired  [employer] laboratory technician . Financial resource strain: Not hard at all  . Food insecurity:    Worry: Never true    Inability: Never true  . Transportation needs:    Medical: No    Non-medical: No  Tobacco Use  . Smoking status: Never Smoker  . Smokeless tobacco: Never Used  Substance and Sexual Activity  . Alcohol use: Not Currently  . Drug use: Not Currently  . Sexual activity: Not Currently  Lifestyle  . Physical activity:    Days per  week: 0 days    Minutes per session: 0 min  . Stress: To some extent  Relationships  . Social connections:    Talks on phone: More than three times a week    Gets together: More than three times a week    Attends religious service: 1 to 4 times per year    Active member of club or organization: Yes    Attends meetings of clubs or organizations: More than 4 times per year    Relationship status: Divorced  Other Topics Concern  . Not on file  Social History Narrative   [employer] -BS, has had additional course work.  married '71- after 29 years husband left, divorce final in '06.  1 son- '74, 1 daughter '76; 2 grandchildren. 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 Lives in her own home, daughter and grandchild home with her. ACP - HCPOA dtr - tiffany (c) [phone] [phone]. CPR - ?, no  mechanical ventilation except short term care, no prolonged artificial nutrition.     Outpatient Encounter Medications as of 04/01/2018  Medication Sig  . albuterol (PROVENTIL HFA;VENTOLIN HFA) 108 (90 Base) MCG/ACT inhaler Inhale 2 puffs into the lungs every 6 (six) hours as needed.  . Ascorbic Acid (VITAMIN C PO) Take 1 tablet by mouth daily.    Marland Kitchen BREO ELLIPTA 200-25 MCG/INH AEPB INHALE 1 PUFF PO QD  . Cyanocobalamin (B-12 PO) Take by mouth daily.    Marland Kitchen dicyclomine (BENTYL) 20 MG tablet TAKE 1/2 TABLET BY MOUTH THREE TIMES DAILY BEFORE MEALS  . dorzolamide (TRUSOPT) 2 % ophthalmic solution Place into both eyes 2 (two) times daily.   Marland Kitchen guaiFENesin (ROBITUSSIN) 100 MG/5ML liquid Take 200 mg 3 (three) times daily as needed by mouth for cough.  . latanoprost (XALATAN) 0.005 % ophthalmic solution Place 1 drop into both eyes daily.  Marland Kitchen levalbuterol (XOPENEX) 1.25 MG/3ML nebulizer solution Take 1.25 mg by nebulization as needed.  Marland Kitchen levocetirizine (XYZAL) 5 MG tablet TK 1 T PO QD IN THE EVE  . multivitamin (THERAGRAN) tablet Take 1 tablet by mouth daily.    . [DISCONTINUED] clonazePAM (KLONOPIN) 0.5 MG tablet Take 1 tablet (0.5 mg total) by mouth daily as needed. for anxiety  . [DISCONTINUED] diltiazem (TIAZAC) 120 MG 24 hr capsule TAKE ONE CAPSULE BY MOUTH DAILY; KEEP 03/31/2017 APPOINTMENT FOR FURTHER REFILLS  . [DISCONTINUED] escitalopram (LEXAPRO) 10 MG tablet Take 2 tablets (20 mg total) by mouth daily.  . [DISCONTINUED] furosemide (LASIX) 40 MG tablet TAKE 1 TABLET(40 MG) BY MOUTH DAILY  . [DISCONTINUED] ibuprofen (ADVIL,MOTRIN) 800 MG tablet TAKE 1 TABLET(800 MG) BY MOUTH DAILY AS NEEDED FOR MODERATE PAIN  . [DISCONTINUED] losartan (COZAAR) 100 MG tablet TAKE 1 TABLET(100 MG) BY MOUTH DAILY  . [DISCONTINUED] omeprazole (PRILOSEC) 40 MG capsule TAKE 1 CAPSULE BY MOUTH EVERY DAY  . [DISCONTINUED] potassium chloride (KLOR-CON) 8 MEQ tablet TAKE 1 TABLET BY MOUTH EVERY DAY  . SUMAtriptan (IMITREX)  100 MG tablet Take 1 tablet (100 mg total) by mouth once. Take 1 tablet at onset of headache  . [DISCONTINUED] Cetirizine HCl (ZYRTEC PO) Take by mouth daily.  . [DISCONTINUED] diltiazem (DILACOR XR) 120 MG 24 hr capsule Take 1 capsule (120 mg total) by mouth daily.  . [DISCONTINUED] potassium chloride (KLOR-CON) 8 MEQ tablet TAKE 1 TABLET(8 MEQ) BY MOUTH DAILY (Patient not taking: Reported on 04/01/2018)   No facility-administered encounter medications on file as of 04/01/2018.  Activities of Daily Living In your present state of health, do you have any difficulty performing the following activities: 04/01/2018  Hearing? N  Vision? N  Difficulty concentrating or making decisions? N  Walking or climbing stairs? N  Dressing or bathing? N  Doing errands, shopping? N  Preparing Food and eating ? N  Using the Toilet? N  In the past six months, have you accidently leaked urine? N  Do you have problems with loss of bowel control? N  Managing your Medications? N  Managing your Finances? N  Housekeeping or managing your Housekeeping? N  Some recent data might be hidden    Patient Care Team: Hoyt Koch, MD as PCP - General (Internal Medicine) Mosetta Anis, MD (Allergy) Romine, Lubertha South, MD (Obstetrics and Gynecology)    Assessment:   This is a routine wellness examination for Alechia. Physical assessment deferred to PCP.   Exercise Activities and Dietary recommendations Current Exercise Habits: The patient does not participate in regular exercise at present, Exercise limited by: orthopedic condition(s)  Diet (meal preparation, eat out, water intake, caffeinated beverages, dairy products, fruits and vegetables): in general, a "healthy" diet  , well balanced   Reviewed heart healthy diet. Encouraged patient to increase daily water and healthy fluid intake.  Goals    . Patient Stated     I would like to remove the clutter from my house. I will continue to do what I  can to relax and decrease my stress level. I am going to start to go to the [facility].     . Patient Stated     Continue to lose weight and get involved at the senior centers.    . Weight < 200 lb (90.719 kg)     Lose 7 lbs; more movement; having more energy; try water and try tai chi/ Based on pulmonary and may seek guidance from pulmonologist as she feels tried and gets sob       Fall Risk Fall Risk  04/01/2018 03/31/2017 03/28/2016 02/27/2015  Falls in the past year? 1 Yes No No  Number falls in past yr: 0 - - -  Injury with Fall? 1 No - -  Follow up Falls prevention discussed - - -    Depression Screen PHQ 2/9 Scores 04/01/2018 03/31/2017 03/28/2016 01/17/2016  PHQ - 2 Score 4 2 0 5  PHQ- 9 Score 7 2 - 9     Cognitive Function MMSE - Mini Mental State Exam 02/27/2015  Not completed: (No Data)       Ad8 score reviewed for issues:  Issues making decisions: no  Less interest in hobbies / activities: no  Repeats questions, stories (family complaining): no  Trouble using ordinary gadgets (microwave, computer, phone):no  Forgets the month or year: no  Mismanaging finances: no  Remembering appts: no  Daily problems with thinking and/or memory: no Ad8 score is= 0  Immunization History  Administered Date(s) Administered  . Influenza Split 04/05/2011  . Influenza Whole 04/22/2007, 02/23/2008, 02/21/2009, 03/06/2009, 02/28/2010, 01/26/2014  . Influenza, High Dose Seasonal PF 03/06/2017  . Influenza,inj,Quad PF,6+ Mos 01/17/2016  . Influenza-Unspecified 02/21/2015, 02/10/2018  . Pneumococcal Conjugate-13 03/16/2015  . Pneumococcal Polysaccharide-23 05/29/2005, 04/05/2011  . Zoster 07/26/2009   Screening Tests Health Maintenance  Topic Date Due  . TETANUS/TDAP  07/13/2018  . MAMMOGRAM  03/18/2020  . COLONOSCOPY  12/25/2022  . INFLUENZA VACCINE  Completed  . DEXA SCAN  Completed  . Hepatitis C Screening  Completed  .  PNA vac Low Risk Adult  Completed       Plan:      Continue doing brain stimulating activities (puzzles, reading, adult coloring books, staying active) to keep memory sharp.   Continue to eat heart healthy diet (full of fruits, vegetables, whole grains, lean protein, water--limit salt, fat, and sugar intake) and increase physical activity as tolerated.  I have personally reviewed and noted the following in the patient's chart:   . Medical and social history . Use of alcohol, tobacco or illicit drugs  . Current medications and supplements . Functional ability and status . Nutritional status . Physical activity . Advanced directives . List of other physicians . Vitals . Screenings to include cognitive, depression, and falls . Referrals and appointments  In addition, I have reviewed and discussed with patient certain preventive protocols, quality metrics, and best practice recommendations. A written personalized care plan for preventive services as well as general preventive health recommendations were provided to patient.     Michiel Cowboy, RN  04/01/2018

## 2018-04-01 ENCOUNTER — Ambulatory Visit (INDEPENDENT_AMBULATORY_CARE_PROVIDER_SITE_OTHER): Payer: PPO | Admitting: *Deleted

## 2018-04-01 ENCOUNTER — Ambulatory Visit (INDEPENDENT_AMBULATORY_CARE_PROVIDER_SITE_OTHER): Payer: PPO | Admitting: Internal Medicine

## 2018-04-01 ENCOUNTER — Other Ambulatory Visit (INDEPENDENT_AMBULATORY_CARE_PROVIDER_SITE_OTHER): Payer: PPO

## 2018-04-01 ENCOUNTER — Encounter: Payer: Self-pay | Admitting: Internal Medicine

## 2018-04-01 VITALS — BP 128/82 | HR 60 | Temp 98.4°F | Ht 66.0 in | Wt 199.0 lb

## 2018-04-01 VITALS — BP 128/82 | HR 60 | Temp 98.4°F | Resp 17 | Ht 66.0 in | Wt 199.0 lb

## 2018-04-01 DIAGNOSIS — I10 Essential (primary) hypertension: Secondary | ICD-10-CM

## 2018-04-01 DIAGNOSIS — J4520 Mild intermittent asthma, uncomplicated: Secondary | ICD-10-CM | POA: Diagnosis not present

## 2018-04-01 DIAGNOSIS — K219 Gastro-esophageal reflux disease without esophagitis: Secondary | ICD-10-CM

## 2018-04-01 DIAGNOSIS — F411 Generalized anxiety disorder: Secondary | ICD-10-CM

## 2018-04-01 DIAGNOSIS — Z0000 Encounter for general adult medical examination without abnormal findings: Secondary | ICD-10-CM

## 2018-04-01 LAB — CBC
HCT: 39.8 % (ref 36.0–46.0)
Hemoglobin: 12.4 g/dL (ref 12.0–15.0)
MCHC: 31.1 g/dL (ref 30.0–36.0)
MCV: 73.5 fl — ABNORMAL LOW (ref 78.0–100.0)
Platelets: 231 10*3/uL (ref 150.0–400.0)
RBC: 5.42 Mil/uL — ABNORMAL HIGH (ref 3.87–5.11)
RDW: 15.2 % (ref 11.5–15.5)
WBC: 5.8 10*3/uL (ref 4.0–10.5)

## 2018-04-01 LAB — COMPREHENSIVE METABOLIC PANEL
ALT: 12 U/L (ref 0–35)
AST: 17 U/L (ref 0–37)
Albumin: 4 g/dL (ref 3.5–5.2)
Alkaline Phosphatase: 88 U/L (ref 39–117)
BUN: 13 mg/dL (ref 6–23)
CO2: 26 mEq/L (ref 19–32)
Calcium: 9.3 mg/dL (ref 8.4–10.5)
Chloride: 105 mEq/L (ref 96–112)
Creatinine, Ser: 1.02 mg/dL (ref 0.40–1.20)
GFR: 68.45 mL/min (ref >60.00)
Glucose, Bld: 103 mg/dL — ABNORMAL HIGH (ref 70–99)
Potassium: 3.9 mEq/L (ref 3.5–5.1)
Sodium: 140 mEq/L (ref 135–145)
Total Bilirubin: 0.5 mg/dL (ref 0.2–1.2)
Total Protein: 7.5 g/dL (ref 6.0–8.3)

## 2018-04-01 LAB — TSH: TSH: 0.98 u[IU]/mL (ref 0.35–4.50)

## 2018-04-01 LAB — LIPID PANEL
Cholesterol: 174 mg/dL (ref 0–200)
HDL: 47.5 mg/dL (ref >39.00)
LDL Cholesterol: 110 mg/dL — ABNORMAL HIGH (ref 0–99)
NonHDL: 126.12
Total CHOL/HDL Ratio: 4
Triglycerides: 81 mg/dL (ref 0.0–149.0)
VLDL: 16.2 mg/dL (ref 0.0–40.0)

## 2018-04-01 MED ORDER — IBUPROFEN 800 MG PO TABS: ORAL_TABLET | ORAL | 0 refills | Status: DC

## 2018-04-01 MED ORDER — ESCITALOPRAM OXALATE 10 MG PO TABS: 20.0000 mg | ORAL_TABLET | Freq: Every day | ORAL | 3 refills | Status: DC

## 2018-04-01 MED ORDER — FUROSEMIDE 40 MG PO TABS: ORAL_TABLET | ORAL | 3 refills | Status: DC

## 2018-04-01 MED ORDER — CLONAZEPAM 0.5 MG PO TABS: 0.5000 mg | ORAL_TABLET | Freq: Every day | ORAL | 3 refills | Status: DC | PRN

## 2018-04-01 MED ORDER — LOSARTAN POTASSIUM 100 MG PO TABS: ORAL_TABLET | ORAL | 3 refills | Status: DC

## 2018-04-01 MED ORDER — OMEPRAZOLE 40 MG PO CPDR: DELAYED_RELEASE_CAPSULE | ORAL | 3 refills | Status: DC

## 2018-04-01 MED ORDER — POTASSIUM CHLORIDE ER 8 MEQ PO TBCR: 8.0000 meq | EXTENDED_RELEASE_TABLET | Freq: Every day | ORAL | 3 refills | Status: DC

## 2018-04-01 MED ORDER — DILTIAZEM HCL ER BEADS 120 MG PO CP24: ORAL_CAPSULE | ORAL | 3 refills | Status: DC

## 2018-04-01 NOTE — Progress Notes (Signed)
   Subjective:    Patient ID: Dominique Johnston, female    DOB: [birth year], 72 y.o.   MRN: [id number]  HPI The patient is a 72 YO female coming in for physical.   PMH, FMH, social history reviewed and updated  Review of Systems  Constitutional: Negative.   HENT: Negative.   Eyes: Negative.   Respiratory: Negative for cough, chest tightness and shortness of breath.   Cardiovascular: Negative for chest pain, palpitations and leg swelling.  Gastrointestinal: Negative for abdominal distention, abdominal pain, constipation, diarrhea, nausea and vomiting.  Musculoskeletal: Positive for arthralgias.  Skin: Negative.   Neurological: Negative.   Psychiatric/Behavioral: Negative.       Objective:   Physical Exam  Constitutional: She is oriented to person, place, and time. She appears well-developed and well-nourished.  HENT:  Head: Normocephalic and atraumatic.  Eyes: EOM are normal.  Neck: Normal range of motion.  Cardiovascular: Normal rate and regular rhythm.  Pulmonary/Chest: Effort normal and breath sounds normal. No respiratory distress. She has no wheezes. She has no rales.  Abdominal: Soft. Bowel sounds are normal. She exhibits no distension. There is no tenderness. There is no rebound.  Musculoskeletal: She exhibits no edema.  Neurological: She is alert and oriented to person, place, and time. Coordination normal.  Skin: Skin is warm and dry.  Psychiatric: She has a normal mood and affect.   Vitals:   04/01/18 1002  BP: 128/82  Pulse: 60  Temp: 98.4 F (36.9 C)  SpO2: 100%  Weight: 199 lb (90.3 kg)  Height: 5\' 6"  (1.676 m)      Assessment & Plan:

## 2018-04-01 NOTE — Assessment & Plan Note (Signed)
Taking diltiazem, lasix, and losartan with good BP control. Checking CMP and adjust as needed.

## 2018-04-01 NOTE — Assessment & Plan Note (Signed)
Using breo and albuterol prn. No flare today.

## 2018-04-01 NOTE — Assessment & Plan Note (Signed)
Using rare clonazepam and taking lexapro 20 mg daily.

## 2018-04-01 NOTE — Patient Instructions (Addendum)
Continue doing brain stimulating activities (puzzles, reading, adult coloring books, staying active) to keep memory sharp.   Continue to eat heart healthy diet (full of fruits, vegetables, whole grains, lean protein, water--limit salt, fat, and sugar intake) and increase physical activity as tolerated.   [name] , Thank you for taking time to come for your Medicare Wellness Visit. I appreciate your ongoing commitment to your health goals. Please review the following plan we discussed and let me know if I can assist you in the future.   These are the goals we discussed: Goals    . Patient Stated     I would like to remove the clutter from my house. I will continue to do what I can to relax and decrease my stress level. I am going to start to go to the [facility].     . Patient Stated     Continue to lose weight and get involved at the senior centers.    . Weight < 200 lb (90.719 kg)     Lose 7 lbs; more movement; having more energy; try water and try tai chi/ Based on pulmonary and may seek guidance from pulmonologist as she feels tried and gets sob       This is a list of the screening recommended for you and due dates:  Health Maintenance  Topic Date Due  . Flu Shot  12/11/2017  . Tetanus Vaccine  07/13/2018  . Mammogram  03/18/2020  . Colon Cancer Screening  12/25/2022  . DEXA scan (bone density measurement)  Completed  .  Hepatitis C: One time screening is recommended by Center for Disease Control  (CDC) for  adults born from [birth year] through 1965.   Completed  . Pneumonia vaccines  Completed     Health Maintenance, Female Adopting a healthy lifestyle and getting preventive care can go a long way to promote health and wellness. Talk with your health care provider about what schedule of regular examinations is right for you. This is a good chance for you to check in with your provider about disease prevention and staying healthy. In between checkups, there are plenty of things  you can do on your own. Experts have done a lot of research about which lifestyle changes and preventive measures are most likely to keep you healthy. Ask your health care provider for more information. Weight and diet Eat a healthy diet  Be sure to include plenty of vegetables, fruits, low-fat dairy products, and lean protein.  Do not eat a lot of foods high in solid fats, added sugars, or salt.  Get regular exercise. This is one of the most important things you can do for your health. ? Most adults should exercise for at least 150 minutes each week. The exercise should increase your heart rate and make you sweat (moderate-intensity exercise). ? Most adults should also do strengthening exercises at least twice a week. This is in addition to the moderate-intensity exercise.  Maintain a healthy weight  Body mass index (BMI) is a measurement that can be used to identify possible weight problems. It estimates body fat based on height and weight. Your health care provider can help determine your BMI and help you achieve or maintain a healthy weight.  For females 54 years of age and older: ? A BMI below 18.5 is considered underweight. ? A BMI of 18.5 to 24.9 is normal. ? A BMI of 25 to 29.9 is considered overweight. ? A BMI of 30 and  above is considered obese.  Watch levels of cholesterol and blood lipids  You should start having your blood tested for lipids and cholesterol at 72 years of age, then have this test every 5 years.  You may need to have your cholesterol levels checked more often if: ? Your lipid or cholesterol levels are high. ? You are older than 72 years of age. ? You are at high risk for heart disease.  Cancer screening Lung Cancer  Lung cancer screening is recommended for adults 35-17 years old who are at high risk for lung cancer because of a history of smoking.  A yearly low-dose CT scan of the lungs is recommended for people who: ? Currently smoke. ? Have quit  within the past 15 years. ? Have at least a 30-pack-year history of smoking. A pack year is smoking an average of one pack of cigarettes a day for 1 year.  Yearly screening should continue until it has been 15 years since you quit.  Yearly screening should stop if you develop a health problem that would prevent you from having lung cancer treatment.  Breast Cancer  Practice breast self-awareness. This means understanding how your breasts normally appear and feel.  It also means doing regular breast self-exams. Let your health care provider know about any changes, no matter how small.  If you are in your 20s or 30s, you should have a clinical breast exam (CBE) by a health care provider every 1-3 years as part of a regular health exam.  If you are 39 or older, have a CBE every year. Also consider having a breast X-ray (mammogram) every year.  If you have a family history of breast cancer, talk to your health care provider about genetic screening.  If you are at high risk for breast cancer, talk to your health care provider about having an MRI and a mammogram every year.  Breast cancer gene (BRCA) assessment is recommended for women who have family members with BRCA-related cancers. BRCA-related cancers include: ? Breast. ? Ovarian. ? Tubal. ? Peritoneal cancers.  Results of the assessment will determine the need for genetic counseling and BRCA1 and BRCA2 testing.  Cervical Cancer Your health care provider may recommend that you be screened regularly for cancer of the pelvic organs (ovaries, uterus, and vagina). This screening involves a pelvic examination, including checking for microscopic changes to the surface of your cervix (Pap test). You may be encouraged to have this screening done every 3 years, beginning at age 65.  For women ages 4-65, health care providers may recommend pelvic exams and Pap testing every 3 years, or they may recommend the Pap and pelvic exam, combined with  testing for human papilloma virus (HPV), every 5 years. Some types of HPV increase your risk of cervical cancer. Testing for HPV may also be done on women of any age with unclear Pap test results.  Other health care providers may not recommend any screening for nonpregnant women who are considered low risk for pelvic cancer and who do not have symptoms. Ask your health care provider if a screening pelvic exam is right for you.  If you have had past treatment for cervical cancer or a condition that could lead to cancer, you need Pap tests and screening for cancer for at least 20 years after your treatment. If Pap tests have been discontinued, your risk factors (such as having a new sexual partner) need to be reassessed to determine if screening should resume. Some women  have medical problems that increase the chance of getting cervical cancer. In these cases, your health care provider may recommend more frequent screening and Pap tests.  Colorectal Cancer  This type of cancer can be detected and often prevented.  Routine colorectal cancer screening usually begins at 72 years of age and continues through 72 years of age.  Your health care provider may recommend screening at an earlier age if you have risk factors for colon cancer.  Your health care provider may also recommend using home test kits to check for hidden blood in the stool.  A small camera at the end of a tube can be used to examine your colon directly (sigmoidoscopy or colonoscopy). This is done to check for the earliest forms of colorectal cancer.  Routine screening usually begins at age 62.  Direct examination of the colon should be repeated every 5-10 years through 72 years of age. However, you may need to be screened more often if early forms of precancerous polyps or small growths are found.  Skin Cancer  Check your skin from head to toe regularly.  Tell your health care provider about any new moles or changes in moles,  especially if there is a change in a mole's shape or color.  Also tell your health care provider if you have a mole that is larger than the size of a pencil eraser.  Always use sunscreen. Apply sunscreen liberally and repeatedly throughout the day.  Protect yourself by wearing long sleeves, pants, a wide-brimmed hat, and sunglasses whenever you are outside.  Heart disease, diabetes, and high blood pressure  High blood pressure causes heart disease and increases the risk of stroke. High blood pressure is more likely to develop in: ? People who have blood pressure in the high end of the normal range (130-139/85-89 mm Hg). ? People who are overweight or obese. ? People who are African American.  If you are 40-62 years of age, have your blood pressure checked every 3-5 years. If you are 23 years of age or older, have your blood pressure checked every year. You should have your blood pressure measured twice-once when you are at a hospital or clinic, and once when you are not at a hospital or clinic. Record the average of the two measurements. To check your blood pressure when you are not at a hospital or clinic, you can use: ? An automated blood pressure machine at a pharmacy. ? A home blood pressure monitor.  If you are between 71 years and 20 years old, ask your health care provider if you should take aspirin to prevent strokes.  Have regular diabetes screenings. This involves taking a blood sample to check your fasting blood sugar level. ? If you are at a normal weight and have a low risk for diabetes, have this test once every three years after 72 years of age. ? If you are overweight and have a high risk for diabetes, consider being tested at a younger age or more often. Preventing infection Hepatitis B  If you have a higher risk for hepatitis B, you should be screened for this virus. You are considered at high risk for hepatitis B if: ? You were born in a country where hepatitis B is  common. Ask your health care provider which countries are considered high risk. ? Your parents were born in a high-risk country, and you have not been immunized against hepatitis B (hepatitis B vaccine). ? You have HIV or AIDS. ? You  use needles to inject street drugs. ? You live with someone who has hepatitis B. ? You have had sex with someone who has hepatitis B. ? You get hemodialysis treatment. ? You take certain medicines for conditions, including cancer, organ transplantation, and autoimmune conditions.  Hepatitis C  Blood testing is recommended for: ? Everyone born from [birth year] through 1965. ? Anyone with known risk factors for hepatitis C.  Sexually transmitted infections (STIs)  You should be screened for sexually transmitted infections (STIs) including gonorrhea and chlamydia if: ? You are sexually active and are younger than 72 years of age. ? You are older than 72 years of age and your health care provider tells you that you are at risk for this type of infection. ? Your sexual activity has changed since you were last screened and you are at an increased risk for chlamydia or gonorrhea. Ask your health care provider if you are at risk.  If you do not have HIV, but are at risk, it may be recommended that you take a prescription medicine daily to prevent HIV infection. This is called pre-exposure prophylaxis (PrEP). You are considered at risk if: ? You are sexually active and do not regularly use condoms or know the HIV status of your partner(s). ? You take drugs by injection. ? You are sexually active with a partner who has HIV.  Talk with your health care provider about whether you are at high risk of being infected with HIV. If you choose to begin PrEP, you should first be tested for HIV. You should then be tested every 3 months for as long as you are taking PrEP. Pregnancy  If you are premenopausal and you may become pregnant, ask your health care provider about preconception  counseling.  If you may become pregnant, take 400 to 800 micrograms (mcg) of folic acid every day.  If you want to prevent pregnancy, talk to your health care provider about birth control (contraception). Osteoporosis and menopause  Osteoporosis is a disease in which the bones lose minerals and strength with aging. This can result in serious bone fractures. Your risk for osteoporosis can be identified using a bone density scan.  If you are 57 years of age or older, or if you are at risk for osteoporosis and fractures, ask your health care provider if you should be screened.  Ask your health care provider whether you should take a calcium or vitamin D supplement to lower your risk for osteoporosis.  Menopause may have certain physical symptoms and risks.  Hormone replacement therapy may reduce some of these symptoms and risks. Talk to your health care provider about whether hormone replacement therapy is right for you. Follow these instructions at home:  Schedule regular health, dental, and eye exams.  Stay current with your immunizations.  Do not use any tobacco products including cigarettes, chewing tobacco, or electronic cigarettes.  If you are pregnant, do not drink alcohol.  If you are breastfeeding, limit how much and how often you drink alcohol.  Limit alcohol intake to no more than 1 drink per day for nonpregnant women. One drink equals 12 ounces of beer, 5 ounces of wine, or 1 ounces of hard liquor.  Do not use street drugs.  Do not share needles.  Ask your health care provider for help if you need support or information about quitting drugs.  Tell your health care provider if you often feel depressed.  Tell your health care provider if you have ever been  abused or do not feel safe at home. This information is not intended to replace advice given to you by your health care provider. Make sure you discuss any questions you have with your health care provider. Document  Released: 11/12/2010 Document Revised: 10/05/2015 Document Reviewed: 01/31/2015 Elsevier Interactive Patient Education  [employer].

## 2018-04-01 NOTE — Assessment & Plan Note (Signed)
Taking omeprazole daily with good control.

## 2018-04-01 NOTE — Assessment & Plan Note (Signed)
Flu shot up to date. Pneumonia complete. Shingrix counseled. Tetanus up to date. Colonoscopy up to date. Mammogram up to date, pap smear aged out and dexa up to date. Counseled about sun safety and mole surveillance. Counseled about the dangers of distracted driving. Given 10 year screening recommendations.  

## 2018-04-02 NOTE — Progress Notes (Signed)
Medical screening examination/treatment/procedure(s) were performed by non-physician practitioner and as supervising physician I was immediately available for consultation/collaboration. I agree with above. Elizabeth A Crawford, MD 

## 2018-04-21 DIAGNOSIS — H25041 Posterior subcapsular polar age-related cataract, right eye: Secondary | ICD-10-CM | POA: Diagnosis not present

## 2018-04-21 DIAGNOSIS — H25811 Combined forms of age-related cataract, right eye: Secondary | ICD-10-CM | POA: Diagnosis not present

## 2018-04-21 DIAGNOSIS — H2511 Age-related nuclear cataract, right eye: Secondary | ICD-10-CM | POA: Diagnosis not present

## 2018-04-30 ENCOUNTER — Ambulatory Visit (HOSPITAL_COMMUNITY)
Admission: EM | Admit: 2018-04-30 | Discharge: 2018-04-30 | Disposition: A | Discharge status: Home / Self Care | Payer: PPO | Attending: Family Medicine | Admitting: Family Medicine

## 2018-04-30 ENCOUNTER — Encounter (HOSPITAL_COMMUNITY): Payer: Self-pay | Admitting: Family Medicine

## 2018-04-30 DIAGNOSIS — R69 Illness, unspecified: Secondary | ICD-10-CM

## 2018-04-30 DIAGNOSIS — J111 Influenza due to unidentified influenza virus with other respiratory manifestations: Secondary | ICD-10-CM

## 2018-04-30 MED ORDER — PREDNISONE 20 MG PO TABS: ORAL_TABLET | ORAL | 0 refills | Status: DC

## 2018-04-30 MED ORDER — HYDROCODONE-HOMATROPINE 5-1.5 MG/5ML PO SYRP: 5.0000 mL | ORAL_SOLUTION | Freq: Four times a day (QID) | ORAL | 0 refills | Status: DC | PRN

## 2018-04-30 MED ORDER — OSELTAMIVIR PHOSPHATE 75 MG PO CAPS: 75.0000 mg | ORAL_CAPSULE | Freq: Two times a day (BID) | ORAL | 0 refills | Status: DC

## 2018-04-30 NOTE — ED Triage Notes (Signed)
Pt states "I think I have bronchitis". Coughing since the weekend.

## 2018-04-30 NOTE — ED Provider Notes (Signed)
[facility]    CSN: [id number] Arrival date & time: 04/30/18  1701     History   Chief Complaint Chief Complaint  Patient presents with  . Cough    HPI Dominique Johnston is a 72 y.o. female.   This is a 72 year old woman, established [place] urgent care patient, who comes in for evaluation of cough.  She started having chills and sweats 24 hours ago and has now developed a fever with increasing cough, wheezing, diaphoresis, muscle aches.  She is an asthmatic and is started taking her albuterol in response to the wheezing.     Past Medical History:  Diagnosis Date  . Allergy    rhinitis  . Asthma   . Bouchard nodes (DJD hand)   . Bronchitis    recurrent  . Depression   . DJD (degenerative joint disease) of knee   . DJD (degenerative joint disease), lumbosacral   . GERD (gastroesophageal reflux disease)   . Headache(784.0)   . Headache(784.0)   . Hyperlipidemia   . Hypertension   . IBS (irritable bowel syndrome)   . Sinusitis, chronic     Patient Active Problem List   Diagnosis Date Noted  . Mild intermittent asthma 10/08/2016  . Routine health maintenance 10/25/2012  . Borderline high cholesterol 01/19/2010  . G E REFLUX 12/10/2007  . Generalized anxiety disorder 12/04/2007  . Essential hypertension 12/01/2006  . Allergic rhinitis 12/01/2006  . IRRITABLE BOWEL SYNDROME 12/01/2006  . Osteoarthritis 12/01/2006    Past Surgical History:  Procedure Laterality Date  . BREAST EXCISIONAL BIOPSY Left 1998  . ORIF ANKLE FRACTURE  '07   Left ankle  . SEPTOPLASTY  2000  . TONSILLECTOMY    . transnasal laryngoscopy  2005  . TUBAL LIGATION  1977    OB History   No obstetric history on file.      Home Medications    Prior to Admission medications   Medication Sig Start Date End Date Taking? Authorizing Provider  albuterol (PROVENTIL HFA;VENTOLIN HFA) 108 (90 Base) MCG/ACT inhaler Inhale 2 puffs into the lungs every 6 (six) hours as  needed. 03/28/16   Myrlene Brokerrawford, Elizabeth A, MD  Ascorbic Acid (VITAMIN C PO) Take 1 tablet by mouth daily.      [provider]  Earlie ServerBREO ELLIPTA [phone] MCG/INH AEPB INHALE 1 PUFF PO QD 02/27/16   [provider]  clonazePAM (KLONOPIN) 0.5 MG tablet Take 1 tablet (0.5 mg total) by mouth daily as needed. for anxiety 04/01/18   Myrlene Brokerrawford, Elizabeth A, MD  Cyanocobalamin (B-12 PO) Take by mouth daily.      [provider]  dicyclomine (BENTYL) 20 MG tablet TAKE 1/2 TABLET BY MOUTH THREE TIMES DAILY BEFORE MEALS 01/16/18   Myrlene Brokerrawford, Elizabeth A, MD  diltiazem [facility](TIAZAC) 120 MG 24 hr capsule TAKE ONE CAPSULE BY MOUTH DAILY 04/01/18   Myrlene Brokerrawford, Elizabeth A, MD  dorzolamide (TRUSOPT) 2 % ophthalmic solution Place into both eyes 2 (two) times daily.  01/13/14   [provider]  escitalopram (LEXAPRO) 10 MG tablet Take 2 tablets (20 mg total) by mouth daily. 04/01/18   Myrlene Brokerrawford, Elizabeth A, MD  furosemide (LASIX) 40 MG tablet TAKE 1 TABLET(40 MG) BY MOUTH DAILY 04/01/18   Myrlene Brokerrawford, Elizabeth A, MD  guaiFENesin (ROBITUSSIN) 100 MG/5ML liquid Take 200 mg 3 (three) times daily as needed by mouth for cough.    [provider]  HYDROcodone-homatropine (HYDROMET) 5-1.5 MG/5ML syrup Take 5 mLs by mouth every 6 (six)  hours as needed for cough. 04/30/18   Elvina Sidle, MD  ibuprofen (ADVIL,MOTRIN) 800 MG tablet TAKE 1 TABLET(800 MG) BY MOUTH DAILY AS NEEDED FOR MODERATE PAIN 04/01/18   [name], MD  latanoprost (XALATAN) 0.005 % ophthalmic solution Place 1 drop into both eyes daily. 10/22/12   Norins, Rosalyn Gess, MD  levalbuterol Pauline Aus) 1.25 MG/3ML nebulizer solution Take 1.25 mg by nebulization as needed. 10/22/12   Norins, Rosalyn Gess, MD  levocetirizine (XYZAL) 5 MG tablet TK 1 T PO QD IN THE EVE 02/23/18   [provider]  losartan (COZAAR) 100 MG tablet TAKE 1 TABLET(100 MG) BY MOUTH DAILY 04/01/18   [name], MD  multivitamin [facility])  tablet Take 1 tablet by mouth daily.      [provider]  omeprazole (PRILOSEC) 40 MG capsule TAKE 1 CAPSULE BY MOUTH EVERY DAY 04/01/18   [name], MD  oseltamivir (TAMIFLU) 75 MG capsule Take 1 capsule (75 mg total) by mouth every 12 (twelve) hours. 04/30/18   Elvina Sidle, MD  potassium chloride (KLOR-CON) 8 MEQ tablet Take 1 tablet (8 mEq total) by mouth daily. 04/01/18   [name], MD  predniSONE (DELTASONE) 20 MG tablet Two daily with food 04/30/18   Elvina Sidle, MD  SUMAtriptan (IMITREX) 100 MG tablet Take 1 tablet (100 mg total) by mouth once. Take 1 tablet at onset of headache 03/08/16 03/28/16  [name], MD  diltiazem (DILACOR XR) 120 MG 24 hr capsule Take 1 capsule (120 mg total) by mouth daily. 10/22/11 10/11/12  Norins, Rosalyn Gess, MD    Family History Family History  Problem Relation Age of Onset  . Hypertension Father   . Coronary artery disease Father        MI/ Fatal  . Breast cancer Neg Hx   . Diabetes Neg Hx   . Colon cancer Neg Hx     Social History Social History   Tobacco Use  . Smoking status: Never Smoker  . Smokeless tobacco: Never Used  Substance Use Topics  . Alcohol use: Not Currently  . Drug use: Not Currently     Allergies   Enalapril maleate and Sulfamethoxazole-trimethoprim   Review of Systems Review of Systems   Physical Exam Triage Vital Signs ED Triage Vitals  Enc Vitals Group     BP      Pulse      Resp      Temp      Temp src      SpO2      Weight      Height      Head Circumference      Peak Flow      Pain Score      Pain Loc      Pain Edu?      Excl. in GC?    No data found.  Updated Vital Signs BP (!) 167/65   Pulse 81   Temp 99.1 F (37.3 C)   Resp 16   SpO2 96%    Physical Exam Vitals signs and nursing note reviewed.  Constitutional:      Appearance: Normal appearance.  HENT:     Head: Normocephalic.     Right Ear: Tympanic membrane, ear canal and  external ear normal.     Left Ear: Tympanic membrane, ear canal and external ear normal.     Nose: Nose normal.     Mouth/Throat:     Mouth: Mucous membranes are  moist.  Eyes:     Conjunctiva/sclera: Conjunctivae normal.  Neck:     Musculoskeletal: Normal range of motion and neck supple.  Cardiovascular:     Rate and Rhythm: Normal rate.     Heart sounds: Normal heart sounds.  Pulmonary:     Effort: Pulmonary effort is normal.     Breath sounds: Wheezing and rhonchi present.  Musculoskeletal: Normal range of motion.  Skin:    General: Skin is warm and dry.  Neurological:     General: No focal deficit present.     Mental Status: She is alert.  Psychiatric:        Mood and Affect: Mood normal.      UC Treatments / Results  Labs (all labs ordered are listed, but only abnormal results are displayed) Labs Reviewed - No data to display  EKG None  Radiology No results found.  Procedures Procedures (including critical care time)  Medications Ordered in UC Medications - No data to display  Initial Impression / Assessment and Plan / UC Course  I have reviewed the triage vital signs and the nursing notes.  Pertinent labs & imaging results that were available during my care of the patient were reviewed by me and considered in my medical decision making (see chart for details).    Final Clinical Impressions(s) / UC Diagnoses   Final diagnoses:  Influenza-like illness     Discharge Instructions     Return if not better in 48 hours    ED Prescriptions    Medication Sig Dispense Auth. Provider   oseltamivir (TAMIFLU) 75 MG capsule Take 1 capsule (75 mg total) by mouth every 12 (twelve) hours. 10 capsule Elvina SidleLauenstein, Lynann Demetrius, MD   predniSONE (DELTASONE) 20 MG tablet Two daily with food 10 tablet Elvina SidleLauenstein, Deliana Avalos, MD   HYDROcodone-homatropine (HYDROMET) 5-1.5 MG/5ML syrup Take 5 mLs by mouth every 6 (six) hours as needed for cough. 60 mL Elvina SidleLauenstein, Staphanie Harbison, MD      Controlled Substance Prescriptions [place] Controlled Substance Registry consulted? Not Applicable   Elvina SidleLauenstein, Jochebed Bills, MD 04/30/18 1756

## 2018-04-30 NOTE — Discharge Instructions (Addendum)
Return if not better in 48 hours

## 2018-05-03 ENCOUNTER — Ambulatory Visit (HOSPITAL_COMMUNITY)
Admission: EM | Admit: 2018-05-03 | Discharge: 2018-05-03 | Disposition: A | Discharge status: Home / Self Care | Payer: PPO | Attending: Internal Medicine | Admitting: Internal Medicine

## 2018-05-03 ENCOUNTER — Encounter (HOSPITAL_COMMUNITY): Payer: Self-pay

## 2018-05-03 ENCOUNTER — Other Ambulatory Visit: Payer: Self-pay

## 2018-05-03 DIAGNOSIS — J209 Acute bronchitis, unspecified: Secondary | ICD-10-CM

## 2018-05-03 MED ORDER — AZITHROMYCIN 250 MG PO TABS: ORAL_TABLET | ORAL | 0 refills | Status: DC

## 2018-05-03 NOTE — ED Triage Notes (Addendum)
Pt cc fever. Pt states she needs a antibiotic. Pt states she been coughing up mucus.

## 2018-05-03 NOTE — ED Provider Notes (Signed)
[facility]    CSN: [id number] Arrival date & time: 05/03/18  1512     History   Chief Complaint Chief Complaint  Patient presents with  . Fever    HPI Dominique Johnston is a 72 y.o. female.    72 y/o with history of asthma Who present due to having more of a productive cough and is having to use her inahaler more. Today when she woke up she felt more fatigued, when she  Was feeling more energetic yesterday. The Tamiflu she was prescribed on 12/19 for Influenza has helped. She started having a low grade fever again today. She has had bronchitis in the past. This pm while waiting to be seen broke out in sweats. She feels as when she gets a secondary infection.     Past Medical History:  Diagnosis Date  . Allergy    rhinitis  . Asthma   . Bouchard nodes (DJD hand)   . Bronchitis    recurrent  . Depression   . DJD (degenerative joint disease) of knee   . DJD (degenerative joint disease), lumbosacral   . GERD (gastroesophageal reflux disease)   . Headache(784.0)   . Headache(784.0)   . Hyperlipidemia   . Hypertension   . IBS (irritable bowel syndrome)   . Sinusitis, chronic     Patient Active Problem List   Diagnosis Date Noted  . Mild intermittent asthma 10/08/2016  . Routine health maintenance 10/25/2012  . Borderline high cholesterol 01/19/2010  . G E REFLUX 12/10/2007  . Generalized anxiety disorder 12/04/2007  . Essential hypertension 12/01/2006  . Allergic rhinitis 12/01/2006  . IRRITABLE BOWEL SYNDROME 12/01/2006  . Osteoarthritis 12/01/2006    Past Surgical History:  Procedure Laterality Date  . BREAST EXCISIONAL BIOPSY Left 1998  . ORIF ANKLE FRACTURE  '07   Left ankle  . SEPTOPLASTY  2000  . TONSILLECTOMY    . transnasal laryngoscopy  2005  . TUBAL LIGATION  1977    OB History   No obstetric history on file.     Home Medications    Prior to Admission medications   Medication Sig Start Date End Date Taking? Authorizing  Provider  albuterol (PROVENTIL HFA;VENTOLIN HFA) 108 (90 Base) MCG/ACT inhaler Inhale 2 puffs into the lungs every 6 (six) hours as needed. 03/28/16   Myrlene Brokerrawford, Elizabeth A, MD  Ascorbic Acid (VITAMIN C PO) Take 1 tablet by mouth daily.      [provider]  azithromycin (ZITHROMAX Z-PAK) 250 MG tablet 2 today, then  1 qd x 4 days 05/03/18   Rodriguez-Southworth, Nettie ElmSylvia, PA-C  BREO ELLIPTA 200-25 MCG/INH AEPB INHALE 1 PUFF PO QD 02/27/16   [provider]  clonazePAM (KLONOPIN) 0.5 MG tablet Take 1 tablet (0.5 mg total) by mouth daily as needed. for anxiety 04/01/18   Myrlene Brokerrawford, Elizabeth A, MD  Cyanocobalamin (B-12 PO) Take by mouth daily.      [provider]  dicyclomine (BENTYL) 20 MG tablet TAKE 1/2 TABLET BY MOUTH THREE TIMES DAILY BEFORE MEALS 01/16/18   Myrlene Brokerrawford, Elizabeth A, MD  diltiazem [facility](TIAZAC) 120 MG 24 hr capsule TAKE ONE CAPSULE BY MOUTH DAILY 04/01/18   Myrlene Brokerrawford, Elizabeth A, MD  dorzolamide (TRUSOPT) 2 % ophthalmic solution Place into both eyes 2 (two) times daily.  01/13/14   [provider]  escitalopram (LEXAPRO) 10 MG tablet Take 2 tablets (20 mg total) by mouth daily. 04/01/18   Myrlene Brokerrawford, Elizabeth A, MD  furosemide (LASIX) 40 MG tablet  TAKE 1 TABLET(40 MG) BY MOUTH DAILY 04/01/18   [name], MD  guaiFENesin (ROBITUSSIN) 100 MG/5ML liquid Take 200 mg 3 (three) times daily as needed by mouth for cough.    [provider]  HYDROcodone-homatropine (HYDROMET) 5-1.5 MG/5ML syrup Take 5 mLs by mouth every 6 (six) hours as needed for cough. 04/30/18   Elvina Sidle, MD  ibuprofen (ADVIL,MOTRIN) 800 MG tablet TAKE 1 TABLET(800 MG) BY MOUTH DAILY AS NEEDED FOR MODERATE PAIN 04/01/18   [name], MD  latanoprost (XALATAN) 0.005 % ophthalmic solution Place 1 drop into both eyes daily. 10/22/12   Norins, Rosalyn Gess, MD  levalbuterol Pauline Aus) 1.25 MG/3ML nebulizer solution Take 1.25 mg by nebulization as needed. 10/22/12    Norins, Rosalyn Gess, MD  levocetirizine (XYZAL) 5 MG tablet TK 1 T PO QD IN THE EVE 02/23/18   [provider]  losartan (COZAAR) 100 MG tablet TAKE 1 TABLET(100 MG) BY MOUTH DAILY 04/01/18   [name], MD  multivitamin [facility]) tablet Take 1 tablet by mouth daily.      [provider]  omeprazole (PRILOSEC) 40 MG capsule TAKE 1 CAPSULE BY MOUTH EVERY DAY 04/01/18   [name], MD  oseltamivir (TAMIFLU) 75 MG capsule Take 1 capsule (75 mg total) by mouth every 12 (twelve) hours. 04/30/18   Elvina Sidle, MD  potassium chloride (KLOR-CON) 8 MEQ tablet Take 1 tablet (8 mEq total) by mouth daily. 04/01/18   [name], MD  predniSONE (DELTASONE) 20 MG tablet Two daily with food 04/30/18   Elvina Sidle, MD  SUMAtriptan (IMITREX) 100 MG tablet Take 1 tablet (100 mg total) by mouth once. Take 1 tablet at onset of headache 03/08/16 03/28/16  [name], MD  diltiazem (DILACOR XR) 120 MG 24 hr capsule Take 1 capsule (120 mg total) by mouth daily. 10/22/11 10/11/12  Norins, Rosalyn Gess, MD    Family History Family History  Problem Relation Age of Onset  . Hypertension Father   . Coronary artery disease Father        MI/ Fatal  . Breast cancer Neg Hx   . Diabetes Neg Hx   . Colon cancer Neg Hx     Social History Social History   Tobacco Use  . Smoking status: Never Smoker  . Smokeless tobacco: Never Used  Substance Use Topics  . Alcohol use: Not Currently  . Drug use: Not Currently     Allergies   Enalapril maleate and Sulfamethoxazole-trimethoprim   Review of Systems Review of Systems  Constitutional: Positive for chills, diaphoresis and fever.  HENT: Positive for congestion, postnasal drip and rhinorrhea. Negative for ear discharge, ear pain and sore throat.   Eyes: Negative for discharge.  Respiratory: Positive for cough and wheezing. Negative for shortness of breath.   Cardiovascular: Negative for chest pain.    Musculoskeletal: Negative for arthralgias and myalgias.  Skin: Negative for rash.  Neurological: Negative for headaches.  Hematological: Negative for adenopathy.     Physical Exam Triage Vital Signs ED Triage Vitals  Enc Vitals Group     BP 05/03/18 1559 (!) 142/90     Pulse Rate 05/03/18 1559 64     Resp 05/03/18 1559 16     Temp 05/03/18 1559 98.2 F (36.8 C)     Temp Source 05/03/18 1559 Oral     SpO2 05/03/18 1559 99 %     Weight 05/03/18 1558 200 lb (90.7 kg)     Height --  Head Circumference --      Peak Flow --      Pain Score 05/03/18 1558 1     Pain Loc --      Pain Edu? --      Excl. in GC? --    No data found.  Updated Vital Signs BP (!) 142/90 (BP Location: Right Arm)   Pulse 64   Temp 98.2 F (36.8 C) (Oral)   Resp 16   Wt 200 lb (90.7 kg)   SpO2 99%   BMI 32.28 kg/m   Visual Acuity Right Eye Distance:   Left Eye Distance:   Bilateral Distance:    Right Eye Near:   Left Eye Near:    Bilateral Near:     Physical Exam Vitals signs and nursing note reviewed.  Constitutional:      Appearance: Normal appearance.     Comments: Her voice is horsed  HENT:     Head: Normocephalic.     Right Ear: Tympanic membrane, ear canal and external ear normal.     Left Ear: Tympanic membrane, ear canal and external ear normal.     Nose: Congestion present. No rhinorrhea.     Mouth/Throat:     Mouth: Mucous membranes are moist.     Pharynx: Oropharynx is clear.  Eyes:     General: No scleral icterus.       Right eye: No discharge.        Left eye: No discharge.     Conjunctiva/sclera: Conjunctivae normal.  Neck:     Musculoskeletal: Neck supple.  Cardiovascular:     Rate and Rhythm: Normal rate and regular rhythm.     Heart sounds: No murmur.  Pulmonary:     Effort: Pulmonary effort is normal. No respiratory distress.     Breath sounds: Wheezing present. No rhonchi or rales.     Comments: Has diffuse end of expiration wheezing all  over. Musculoskeletal: Normal range of motion.  Lymphadenopathy:     Cervical: No cervical adenopathy.  Skin:    General: Skin is warm and dry.  Neurological:     Mental Status: She is alert and oriented to person, place, and time.     Gait: Gait normal.  Psychiatric:        Mood and Affect: Mood normal.        Behavior: Behavior normal.        Thought Content: Thought content normal.        Judgment: Judgment normal.      UC Treatments / Results  Labs (all labs ordered are listed, but only abnormal results are displayed) Labs Reviewed - No data to display  EKG None  Radiology No results found.  Procedures Procedures   Medications Ordered in UC Medications - No data to display  Initial Impression / Assessment and Plan / UC Course  I have reviewed the triage vital signs and the nursing notes. She seems to be getting acute bronchitis. I placed her on Zpack which she said she responds to. I advised her to use her neb treatment q 4-6 h  Final Clinical Impressions(s) / UC Diagnoses   Final diagnoses:  Acute bronchitis, unspecified organism     Discharge Instructions     Use your Nebulizer inhaler every 4-6 hours for the next 3 days to help the wheezing better. Then as needed.     ED Prescriptions    Medication Sig Dispense Auth. Provider   azithromycin (ZITHROMAX Z-PAK) 250 MG  tablet 2 today, then  1 qd x 4 days 6 each Rodriguez-Southworth, Nettie ElmSylvia, PA-C     Controlled Substance Prescriptions [place] Controlled Substance Registry consulted?    Garey HamRodriguez-Southworth, Bana Borgmeyer, PA-C 05/03/18 1643

## 2018-05-03 NOTE — Discharge Instructions (Addendum)
Use your Nebulizer inhaler every 4-6 hours for the next 3 days to help the wheezing better. Then as needed.

## 2018-05-15 ENCOUNTER — Other Ambulatory Visit: Payer: Self-pay | Admitting: Internal Medicine

## 2018-05-17 ENCOUNTER — Encounter (HOSPITAL_COMMUNITY): Payer: Self-pay | Admitting: Emergency Medicine

## 2018-05-17 ENCOUNTER — Ambulatory Visit (HOSPITAL_COMMUNITY)
Admission: EM | Admit: 2018-05-17 | Discharge: 2018-05-17 | Disposition: A | Discharge status: Home / Self Care | Payer: PPO | Attending: Physician Assistant | Admitting: Physician Assistant

## 2018-05-17 DIAGNOSIS — H66001 Acute suppurative otitis media without spontaneous rupture of ear drum, right ear: Secondary | ICD-10-CM | POA: Diagnosis not present

## 2018-05-17 DIAGNOSIS — B349 Viral infection, unspecified: Secondary | ICD-10-CM

## 2018-05-17 MED ORDER — FLUTICASONE PROPIONATE 50 MCG/ACT NA SUSP: 2.0000 | Freq: Every day | NASAL | 0 refills | Status: DC

## 2018-05-17 MED ORDER — AMOXICILLIN-POT CLAVULANATE 875-125 MG PO TABS: 1.0000 | ORAL_TABLET | Freq: Two times a day (BID) | ORAL | 0 refills | Status: DC

## 2018-05-17 MED ORDER — IPRATROPIUM BROMIDE 0.06 % NA SOLN: 2.0000 | Freq: Four times a day (QID) | NASAL | 0 refills | Status: DC

## 2018-05-17 NOTE — ED Triage Notes (Signed)
Pt states "ive been running a fever off and on since Thursday. And my head has just been throbbing".

## 2018-05-17 NOTE — Discharge Instructions (Signed)
Start augmentin for right ear infection. Start flonase, atrovent nasal spray for nasal congestion/drainage. You can use over the counter nasal saline rinse such as neti pot for nasal congestion. Keep hydrated, your urine should be clear to pale yellow in color. Tylenol/motrin for fever and pain. Monitor for any worsening of symptoms, chest pain, shortness of breath, wheezing, swelling of the throat, follow up for reevaluation. If experiencing continued fever, with worsening headache, unable to move neck, nausea/vomiting, confusion, go to the emergency department for further evaluation needed.   For sore throat/cough try using a honey-based tea. Use 3 teaspoons of honey with juice squeezed from half lemon. Place shaved pieces of ginger into 1/2-1 cup of water and warm over stove top. Then mix the ingredients and repeat every 4 hours as needed.

## 2018-05-17 NOTE — ED Provider Notes (Signed)
[facility]    CSN: [id number] Arrival date & time: 05/17/18  1458     History   Chief Complaint Chief Complaint  Patient presents with  . Headache  . Fever    HPI Dominique Johnston is a 73 y.o. female.   73 year old female comes in for 4 day history of URI symptoms. Cough, rhinorrhea, nasal congestion. Tmax 100.9. Right sided headache that is throbbing in sensation. Cough can be productive at times.  Denies nausea, vomiting.  Denies neck stiffness/rigidity.  Denies confusion/altered mental status.  OTC cold medication without relief.  Has continued to use albuterol inhaler with good relief.  Never smoker.     Past Medical History:  Diagnosis Date  . Allergy    rhinitis  . Asthma   . Bouchard nodes (DJD hand)   . Bronchitis    recurrent  . Depression   . DJD (degenerative joint disease) of knee   . DJD (degenerative joint disease), lumbosacral   . GERD (gastroesophageal reflux disease)   . Headache(784.0)   . Headache(784.0)   . Hyperlipidemia   . Hypertension   . IBS (irritable bowel syndrome)   . Sinusitis, chronic     Patient Active Problem List   Diagnosis Date Noted  . Mild intermittent asthma 10/08/2016  . Routine health maintenance 10/25/2012  . Borderline high cholesterol 01/19/2010  . G E REFLUX 12/10/2007  . Generalized anxiety disorder 12/04/2007  . Essential hypertension 12/01/2006  . Allergic rhinitis 12/01/2006  . IRRITABLE BOWEL SYNDROME 12/01/2006  . Osteoarthritis 12/01/2006    Past Surgical History:  Procedure Laterality Date  . BREAST EXCISIONAL BIOPSY Left 1998  . ORIF ANKLE FRACTURE  '07   Left ankle  . SEPTOPLASTY  2000  . TONSILLECTOMY    . transnasal laryngoscopy  2005  . TUBAL LIGATION  1977    OB History   No obstetric history on file.      Home Medications    Prior to Admission medications   Medication Sig Start Date End Date Taking? Authorizing Provider  albuterol (PROVENTIL HFA;VENTOLIN HFA)  108 (90 Base) MCG/ACT inhaler Inhale 2 puffs into the lungs every 6 (six) hours as needed. 03/28/16   [name], MD  amoxicillin-clavulanate (AUGMENTIN) 875-125 MG tablet Take 1 tablet by mouth every 12 (twelve) hours. 05/17/18   Cathie Hoops, Amy V, PA-C  Ascorbic Acid (VITAMIN C PO) Take 1 tablet by mouth daily.      [provider]  Earlie Server 096-28 MCG/INH AEPB INHALE 1 PUFF PO QD 02/27/16   [provider]  clonazePAM (KLONOPIN) 0.5 MG tablet Take 1 tablet (0.5 mg total) by mouth daily as needed. for anxiety 04/01/18   [name], MD  Cyanocobalamin (B-12 PO) Take by mouth daily.      [provider]  dicyclomine (BENTYL) 20 MG tablet TAKE 1/2 TABLET BY MOUTH THREE TIMES DAILY BEFORE MEALS 01/16/18   [name], MD  diltiazem [facility]) 120 MG 24 hr capsule TAKE ONE CAPSULE BY MOUTH DAILY 04/01/18   [name], MD  dorzolamide (TRUSOPT) 2 % ophthalmic solution Place into both eyes 2 (two) times daily.  01/13/14   [provider]  escitalopram (LEXAPRO) 10 MG tablet Take 2 tablets (20 mg total) by mouth daily. 04/01/18   [name], MD  fluticasone (FLONASE) 50 MCG/ACT nasal spray Place 2 sprays into both nostrils daily. 05/17/18   Cathie Hoops, Amy V, PA-C  furosemide (LASIX) 40 MG  tablet TAKE 1 TABLET(40 MG) BY MOUTH DAILY 04/01/18   [name], MD  ibuprofen (ADVIL,MOTRIN) 800 MG tablet TAKE 1 TABLET(800 MG) BY MOUTH DAILY AS NEEDED FOR MODERATE PAIN 04/01/18   [name], MD  ipratropium (ATROVENT) 0.06 % nasal spray Place 2 sprays into both nostrils 4 (four) times daily. 05/17/18   Cathie Hoops, Amy V, PA-C  latanoprost (XALATAN) 0.005 % ophthalmic solution Place 1 drop into both eyes daily. 10/22/12   Norins, Rosalyn Gess, MD  levalbuterol Pauline Aus) 1.25 MG/3ML nebulizer solution Take 1.25 mg by nebulization as needed. 10/22/12   Norins, Rosalyn Gess, MD  levocetirizine (XYZAL) 5 MG tablet TK 1 T PO QD IN THE EVE 02/23/18    [provider]  losartan (COZAAR) 100 MG tablet TAKE 1 TABLET(100 MG) BY MOUTH DAILY 04/01/18   [name], MD  multivitamin [facility]) tablet Take 1 tablet by mouth daily.      [provider]  omeprazole (PRILOSEC) 40 MG capsule TAKE 1 CAPSULE BY MOUTH EVERY DAY 04/01/18   [name], MD  potassium chloride (KLOR-CON) 8 MEQ tablet Take 1 tablet (8 mEq total) by mouth daily. 04/01/18   [name], MD  SUMAtriptan (IMITREX) 100 MG tablet Take 1 tablet (100 mg total) by mouth once. Take 1 tablet at onset of headache 03/08/16 03/28/16  [name], MD  diltiazem (DILACOR XR) 120 MG 24 hr capsule Take 1 capsule (120 mg total) by mouth daily. 10/22/11 10/11/12  Norins, Rosalyn Gess, MD    Family History Family History  Problem Relation Age of Onset  . Hypertension Father   . Coronary artery disease Father        MI/ Fatal  . Breast cancer Neg Hx   . Diabetes Neg Hx   . Colon cancer Neg Hx     Social History Social History   Tobacco Use  . Smoking status: Never Smoker  . Smokeless tobacco: Never Used  Substance Use Topics  . Alcohol use: Not Currently  . Drug use: Not Currently     Allergies   Enalapril maleate and Sulfamethoxazole-trimethoprim   Review of Systems Review of Systems  Reason unable to perform ROS: See HPI as above.     Physical Exam Triage Vital Signs ED Triage Vitals  Enc Vitals Group     BP 05/17/18 1635 140/79     Pulse Rate 05/17/18 1635 74     Resp 05/17/18 1635 18     Temp 05/17/18 1635 98.6 F (37 C)     Temp src --      SpO2 05/17/18 1635 98 %     Weight --      Height --      Head Circumference --      Peak Flow --      Pain Score 05/17/18 1637 4     Pain Loc --      Pain Edu? --      Excl. in GC? --    No data found.  Updated Vital Signs BP 140/79   Pulse 74   Temp 98.6 F (37 C)   Resp 18   SpO2 98%   Physical Exam Constitutional:      General: She is not in acute  distress.    Appearance: She is well-developed. She is not ill-appearing, toxic-appearing or diaphoretic.  HENT:     Head: Normocephalic and atraumatic.     Right Ear: Ear canal and external ear normal.  Tympanic membrane is erythematous and bulging.     Left Ear: Tympanic membrane, ear canal and external ear normal. Tympanic membrane is not erythematous or bulging.     Nose:     Right Sinus: Maxillary sinus tenderness and frontal sinus tenderness present.     Left Sinus: No maxillary sinus tenderness or frontal sinus tenderness.     Mouth/Throat:     Mouth: Mucous membranes are moist.     Pharynx: Oropharynx is clear. Uvula midline.     Comments: No gum swelling, no tenderness to palpation along the gums. Eyes:     Extraocular Movements: Extraocular movements intact.     Conjunctiva/sclera: Conjunctivae normal.     Pupils: Pupils are equal, round, and reactive to light.     Comments: No photophobia on exam.  Neck:     Musculoskeletal: Normal range of motion and neck supple. Normal range of motion. No neck rigidity, crepitus, pain with movement, spinous process tenderness or muscular tenderness.  Cardiovascular:     Rate and Rhythm: Normal rate and regular rhythm.     Heart sounds: Normal heart sounds. No murmur. No friction rub. No gallop.   Pulmonary:     Effort: Pulmonary effort is normal.     Breath sounds: Normal breath sounds. No decreased breath sounds, wheezing, rhonchi or rales.  Skin:    General: Skin is warm and dry.  Neurological:     Mental Status: She is alert and oriented to person, place, and time.  Psychiatric:        Behavior: Behavior normal.        Judgment: Judgment normal.      UC Treatments / Results  Labs (all labs ordered are listed, but only abnormal results are displayed) Labs Reviewed - No data to display  EKG None  Radiology No results found.  Procedures Procedures (including critical care time)  Medications Ordered in UC Medications -  No data to display  Initial Impression / Assessment and Plan / UC Course  I have reviewed the triage vital signs and the nursing notes.  Pertinent labs & imaging results that were available during my care of the patient were reviewed by me and considered in my medical decision making (see chart for details).    Patient without neck rigidity, nausea, vomiting, photophobia, low suspicion for meningitis.  Will cover for right ear infection with Augmentin.  Other symptomatic treatment discussed.  Push fluids. Return precautions given.  Final Clinical Impressions(s) / UC Diagnoses   Final diagnoses:  Non-recurrent acute suppurative otitis media of right ear without spontaneous rupture of tympanic membrane  Viral illness    ED Prescriptions    Medication Sig Dispense Auth. Provider   amoxicillin-clavulanate (AUGMENTIN) 875-125 MG tablet Take 1 tablet by mouth every 12 (twelve) hours. 14 tablet Yu, Amy V, PA-C   fluticasone (FLONASE) 50 MCG/ACT nasal spray Place 2 sprays into both nostrils daily. 1 g Yu, Amy V, PA-C   ipratropium (ATROVENT) 0.06 % nasal spray Place 2 sprays into both nostrils 4 (four) times daily. 15 mL Threasa AlphaYu, Amy V, PA-C        Yu, Amy V, [place]PA-[place] 05/17/18 1720

## 2018-05-27 ENCOUNTER — Other Ambulatory Visit: Payer: Self-pay | Admitting: Internal Medicine

## 2018-06-01 ENCOUNTER — Other Ambulatory Visit: Payer: Self-pay | Admitting: Internal Medicine

## 2018-06-21 ENCOUNTER — Other Ambulatory Visit: Payer: Self-pay | Admitting: Internal Medicine

## 2018-07-26 ENCOUNTER — Other Ambulatory Visit: Payer: Self-pay | Admitting: Internal Medicine

## 2018-07-28 ENCOUNTER — Other Ambulatory Visit: Payer: Self-pay | Admitting: Internal Medicine

## 2018-10-07 DIAGNOSIS — H401232 Low-tension glaucoma, bilateral, moderate stage: Secondary | ICD-10-CM | POA: Diagnosis not present

## 2018-10-07 DIAGNOSIS — Z961 Presence of intraocular lens: Secondary | ICD-10-CM | POA: Diagnosis not present

## 2018-11-20 ENCOUNTER — Other Ambulatory Visit: Payer: Self-pay | Admitting: Internal Medicine

## 2019-02-19 ENCOUNTER — Encounter: Payer: Self-pay | Admitting: Internal Medicine

## 2019-02-23 ENCOUNTER — Other Ambulatory Visit: Payer: Self-pay | Admitting: Internal Medicine

## 2019-02-23 DIAGNOSIS — Z1231 Encounter for screening mammogram for malignant neoplasm of breast: Secondary | ICD-10-CM

## 2019-03-12 ENCOUNTER — Other Ambulatory Visit: Payer: Self-pay | Admitting: Internal Medicine

## 2019-03-22 ENCOUNTER — Other Ambulatory Visit: Payer: Self-pay

## 2019-03-22 ENCOUNTER — Ambulatory Visit (INDEPENDENT_AMBULATORY_CARE_PROVIDER_SITE_OTHER)
Admission: RE | Admit: 2019-03-22 | Discharge: 2019-03-22 | Disposition: A | Discharge status: Home / Self Care | Payer: PPO | Source: Ambulatory Visit | Attending: Internal Medicine | Admitting: Internal Medicine

## 2019-03-22 ENCOUNTER — Ambulatory Visit (INDEPENDENT_AMBULATORY_CARE_PROVIDER_SITE_OTHER): Payer: PPO | Admitting: Internal Medicine

## 2019-03-22 ENCOUNTER — Encounter: Payer: Self-pay | Admitting: Internal Medicine

## 2019-03-22 ENCOUNTER — Other Ambulatory Visit (INDEPENDENT_AMBULATORY_CARE_PROVIDER_SITE_OTHER): Payer: PPO

## 2019-03-22 VITALS — BP 138/86 | HR 69 | Temp 98.4°F | Ht 66.0 in | Wt 201.0 lb

## 2019-03-22 DIAGNOSIS — M25552 Pain in left hip: Secondary | ICD-10-CM | POA: Insufficient documentation

## 2019-03-22 DIAGNOSIS — I10 Essential (primary) hypertension: Secondary | ICD-10-CM

## 2019-03-22 DIAGNOSIS — M1612 Unilateral primary osteoarthritis, left hip: Secondary | ICD-10-CM | POA: Diagnosis not present

## 2019-03-22 LAB — COMPREHENSIVE METABOLIC PANEL
ALT: 14 U/L (ref 0–35)
AST: 17 U/L (ref 0–37)
Albumin: 4 g/dL (ref 3.5–5.2)
Alkaline Phosphatase: 81 U/L (ref 39–117)
BUN: 15 mg/dL (ref 6–23)
CO2: 29 mEq/L (ref 19–32)
Calcium: 9 mg/dL (ref 8.4–10.5)
Chloride: 103 mEq/L (ref 96–112)
Creatinine, Ser: 1.01 mg/dL (ref 0.40–1.20)
GFR: 64.97 mL/min (ref >60.00)
Glucose, Bld: 96 mg/dL (ref 70–99)
Potassium: 3.8 mEq/L (ref 3.5–5.1)
Sodium: 139 mEq/L (ref 135–145)
Total Bilirubin: 0.4 mg/dL (ref 0.2–1.2)
Total Protein: 7.3 g/dL (ref 6.0–8.3)

## 2019-03-22 LAB — CBC
HCT: 38.6 % (ref 36.0–46.0)
Hemoglobin: 12 g/dL (ref 12.0–15.0)
MCHC: 31.2 g/dL (ref 30.0–36.0)
MCV: 73.8 fl — ABNORMAL LOW (ref 78.0–100.0)
Platelets: 211 10*3/uL (ref 150.0–400.0)
RBC: 5.23 Mil/uL — ABNORMAL HIGH (ref 3.87–5.11)
RDW: 15.2 % (ref 11.5–15.5)
WBC: 4.1 10*3/uL (ref 4.0–10.5)

## 2019-03-22 LAB — LIPID PANEL
Cholesterol: 175 mg/dL (ref 0–200)
HDL: 52.7 mg/dL (ref >39.00)
LDL Cholesterol: 106 mg/dL — ABNORMAL HIGH (ref 0–99)
NonHDL: 122.7
Total CHOL/HDL Ratio: 3
Triglycerides: 86 mg/dL (ref 0.0–149.0)
VLDL: 17.2 mg/dL (ref 0.0–40.0)

## 2019-03-22 LAB — MAGNESIUM: Magnesium: 1.9 mg/dL (ref 1.5–2.5)

## 2019-03-22 MED ORDER — PREDNISONE 20 MG PO TABS: 40.0000 mg | ORAL_TABLET | Freq: Every day | ORAL | 0 refills | Status: DC

## 2019-03-22 NOTE — Assessment & Plan Note (Signed)
Checking labs to rule out low potassium or other cause of muscle spasms. Checking x-ray to rule out arthritis. Rx prednisone burst for likely trochanteric bursitis and given stretching exercises.

## 2019-03-22 NOTE — Progress Notes (Signed)
   Subjective:   Patient ID: Dominique Johnston, female    DOB: 28-Nov-1945, 73 y.o.   MRN: [id number]  HPI The patient is a 73 YO female coming in for left hip pain. Started several months ago and was intermittent at the time. She is getting spasm down the whole leg which seems to start near the hip and can cause severe pain. Lasts less than 10 minutes. Happening daily. Denies injury or overuse. Having no numbness or weakness in the legs. Walking does make it worse. Overall it is worsening. Has tried aleve and tylenol which does help temporarily. Pain is 8/10.  Review of Systems  Constitutional: Negative.   HENT: Negative.   Eyes: Negative.   Respiratory: Negative for cough, chest tightness and shortness of breath.   Cardiovascular: Negative for chest pain, palpitations and leg swelling.  Gastrointestinal: Negative for abdominal distention, abdominal pain, constipation, diarrhea, nausea and vomiting.  Musculoskeletal: Positive for arthralgias and myalgias.  Skin: Negative.   Neurological: Negative.   Psychiatric/Behavioral: Negative.     Objective:  Physical Exam Constitutional:      Appearance: She is well-developed.  HENT:     Head: Normocephalic and atraumatic.  Neck:     Musculoskeletal: Normal range of motion.  Cardiovascular:     Rate and Rhythm: Normal rate and regular rhythm.  Pulmonary:     Effort: Pulmonary effort is normal. No respiratory distress.     Breath sounds: Normal breath sounds. No wheezing or rales.  Abdominal:     General: Bowel sounds are normal. There is no distension.     Palpations: Abdomen is soft.     Tenderness: There is no abdominal tenderness. There is no rebound.  Musculoskeletal:        General: Tenderness present.     Comments: Tenderness over the trochanteric bursa and down IT band  Skin:    General: Skin is warm and dry.  Neurological:     Mental Status: She is alert and oriented to person, place, and time.     Coordination:  Coordination normal.     Vitals:   03/22/19 1050  BP: 138/86  Pulse: 69  Temp: 98.4 F (36.9 C)  TempSrc: Oral  SpO2: 99%  Weight: 201 lb (91.2 kg)  Height: 5\' 6"  (1.676 m)    Assessment & Plan:

## 2019-03-22 NOTE — Patient Instructions (Signed)
We will check the labs and x-ray.   We have sent in prednisone to take 2 pills daily for 5 days.    Hip Bursitis Rehab Ask your health care provider which exercises are safe for you. Do exercises exactly as told by your health care provider and adjust them as directed. It is normal to feel mild stretching, pulling, tightness, or discomfort as you do these exercises. Stop right away if you feel sudden pain or your pain gets worse. Do not begin these exercises until told by your health care provider. Stretching exercise This exercise warms up your muscles and joints and improves the movement and flexibility of your hip. This exercise also helps to relieve pain and stiffness. Iliotibial band stretch An iliotibial band is a strong band of muscle tissue that runs from the outer side of your hip to the outer side of your thigh and knee. 1. Lie on your side with your left / right leg in the top position. 2. Bend your left / right knee and grab your ankle. Stretch out your bottom arm to help you balance. 3. Slowly bring your knee back so your thigh is behind your body. 4. Slowly lower your knee toward the floor until you feel a gentle stretch on the outside of your left / right thigh. If you do not feel a stretch and your knee will not fall farther, place the heel of your other foot on top of your knee and pull your knee down toward the floor with your foot. 5. Hold this position for __________ seconds. 6. Slowly return to the starting position. Repeat __________ times. Complete this exercise __________ times a day. Strengthening exercises These exercises build strength and endurance in your hip and pelvis. Endurance is the ability to use your muscles for a long time, even after they get tired. Bridge This exercise strengthens the muscles that move your thigh backward (hip extensors). 1. Lie on your back on a firm surface with your knees bent and your feet flat on the floor. 2. Tighten your buttocks  muscles and lift your buttocks off the floor until your trunk is level with your thighs. ? Do not arch your back. ? You should feel the muscles working in your buttocks and the back of your thighs. If you do not feel these muscles, slide your feet 1-2 inches (2.5-5 cm) farther away from your buttocks. ? If this exercise is too easy, try doing it with your arms crossed over your chest. 3. Hold this position for __________ seconds. 4. Slowly lower your hips to the starting position. 5. Let your muscles relax completely after each repetition. Repeat __________ times. Complete this exercise __________ times a day. Squats This exercise strengthens the muscles in front of your thigh and knee (quadriceps). 1. Stand in front of a table, with your feet and knees pointing straight ahead. You may rest your hands on the table for balance but not for support. 2. Slowly bend your knees and lower your hips like you are going to sit in a chair. ? Keep your weight over your heels, not over your toes. ? Keep your lower legs upright so they are parallel with the table legs. ? Do not let your hips go lower than your knees. ? Do not bend lower than told by your health care provider. ? If your hip pain increases, do not bend as low. 3. Hold the squat position for __________ seconds. 4. Slowly push with your legs to return to  standing. Do not use your hands to pull yourself to standing. Repeat __________ times. Complete this exercise __________ times a day. Hip hike 1. Stand sideways on a bottom step. Stand on your left / right leg with your other foot unsupported next to the step. You can hold on to the railing or wall for balance if needed. 2. Keep your knees straight and your torso square. Then lift your left / right hip up toward the ceiling. 3. Hold this position for __________ seconds. 4. Slowly let your left / right hip lower toward the floor, past the starting position. Your foot should get closer to the  floor. Do not lean or bend your knees. Repeat __________ times. Complete this exercise __________ times a day. Single leg stand 1. Without shoes, stand near a railing or in a doorway. You may hold on to the railing or door frame as needed for balance. 2. Squeeze your left / right buttock muscles, then lift up your other foot. ? Do not let your left / right hip push out to the side. ? It is helpful to stand in front of a mirror for this exercise so you can watch your hip. 3. Hold this position for __________ seconds. Repeat __________ times. Complete this exercise __________ times a day. This information is not intended to replace advice given to you by your health care provider. Make sure you discuss any questions you have with your health care provider. Document Released: 06/06/2004 Document Revised: 08/24/2018 Document Reviewed: 08/24/2018 Elsevier Patient Education  2020 [employer].

## 2019-03-31 ENCOUNTER — Telehealth: Payer: Self-pay | Admitting: Internal Medicine

## 2019-03-31 NOTE — Telephone Encounter (Signed)
Patient calling and states that she would like to know what to do next regarding her hip. States that she has finished the prednisone and is alternating ibuprofen and tylenol. States that on her print out there was some exercises, would like to know if she needs to do these regardless of pain level? States that she is still having some tightness in the hip and leg; still elevating leg in bed and states that is helping. Would just like to know what else she needs to do regarding this. Questions if a muscle relaxer would help? CB#: [phone]

## 2019-04-01 ENCOUNTER — Other Ambulatory Visit: Payer: Self-pay

## 2019-04-01 MED ORDER — CLONAZEPAM 0.5 MG PO TABS: 0.5000 mg | ORAL_TABLET | Freq: Every day | ORAL | 3 refills | Status: DC | PRN

## 2019-04-01 NOTE — Telephone Encounter (Signed)
Control database checked last refill: 09/07/2018 30 tabs LOV: 03/22/2019 acute cpe 04/01/2018 NOV: 04/14/2019  Would like a refill does not take often but has been helping with her anxiety since her mom has passed

## 2019-04-01 NOTE — Telephone Encounter (Signed)
Does tylenol and ibuprofen help the pain? Is she overall improving or worsening or stable?

## 2019-04-01 NOTE — Telephone Encounter (Signed)
Ibuprofen and tylenol does help. Does alternate because she has high BP. States that the pain did get better off the prednisone. States has been able to move around more yesterday than she was able days before. States will try the exercises and keep alternating the ibuprofen and tylenol and was told it could take time to get better but since she is getting better overall she is going in the right direction. Will call back and let us know if she continues to have problems

## 2019-04-05 ENCOUNTER — Other Ambulatory Visit: Payer: Self-pay

## 2019-04-13 ENCOUNTER — Other Ambulatory Visit: Payer: Self-pay

## 2019-04-13 MED ORDER — LOSARTAN POTASSIUM 100 MG PO TABS: ORAL_TABLET | ORAL | 0 refills | Status: DC

## 2019-04-14 ENCOUNTER — Ambulatory Visit: Payer: PPO

## 2019-04-14 ENCOUNTER — Encounter: Payer: PPO | Admitting: Internal Medicine

## 2019-04-21 ENCOUNTER — Other Ambulatory Visit: Payer: Self-pay | Admitting: Sports Medicine

## 2019-04-21 DIAGNOSIS — M25552 Pain in left hip: Secondary | ICD-10-CM | POA: Diagnosis not present

## 2019-04-21 DIAGNOSIS — M47816 Spondylosis without myelopathy or radiculopathy, lumbar region: Secondary | ICD-10-CM

## 2019-04-21 DIAGNOSIS — M545 Low back pain, unspecified: Secondary | ICD-10-CM

## 2019-04-21 DIAGNOSIS — M5416 Radiculopathy, lumbar region: Secondary | ICD-10-CM | POA: Diagnosis not present

## 2019-04-26 ENCOUNTER — Ambulatory Visit: Payer: PPO

## 2019-04-26 NOTE — Progress Notes (Signed)
Subjective:   Dominique Johnston is a 73 y.o. female who presents for Medicare Annual (Subsequent) preventive examination.  I connected with patient by a telephone and verified that I am speaking with the correct person using two identifiers. Patient stated full name and DOB. Patient gave permission to continue with telephonic visit. Patient's location was at home and Nurse's location was at [place] office. Participants during this visit included patient and nurse.  Review of Systems:   Cardiac Risk Factors include: advanced age (>48mn, >>53women);hypertension Sleep patterns: feels rested on waking, gets up 0-1 times nightly to void and sleeps 6-7 hours nightly.    Home Safety/Smoke Alarms: Feels safe in home. Smoke alarms in place.  Living environment; residence and Firearm Safety: 1-story house/ trailer. Lives with daughter, no needs for DME, good support system Seat Belt Safety/Bike Helmet: Wears seat belt.     Objective:     Vitals: There were no vitals taken for this visit.  There is no height or weight on file to calculate BMI.  Advanced Directives 04/27/2019 04/01/2018 03/31/2017 10/08/2016 02/27/2015  Does Patient Have a Medical Advance Directive? _0   Does patient want to make changes to medical advance directive? Yes (ED - Information included in AVS) Yes (ED - Information included in AVS) - - -  Would patient like information on creating a medical advance directive? - - Yes (ED - Information included in AVS) - No - patient declined information    Tobacco Social History   Tobacco Use  Smoking Status Never Smoker  Smokeless Tobacco Never Used     Counseling given: Not Answered  Past Medical History:  Diagnosis Date  . Allergy    rhinitis  . Asthma   . Bouchard nodes (DJD hand)   . Bronchitis    recurrent  . Cataract   . Depression   . DJD (degenerative joint disease) of knee   . DJD (degenerative joint disease), lumbosacral   . GERD  (gastroesophageal reflux disease)   . Headache(784.0)   . Headache(784.0)   . Hyperlipidemia   . Hypertension   . IBS (irritable bowel syndrome)   . Sinusitis, chronic    Past Surgical History:  Procedure Laterality Date  . BREAST EXCISIONAL BIOPSY Left 1998  . CATARACT EXTRACTION Right    04/21/18  . ORIF ANKLE FRACTURE  '07   Left ankle  . SEPTOPLASTY  2000  . TONSILLECTOMY    . transnasal laryngoscopy  2005  . TUBAL LIGATION  1977   Family History  Problem Relation Age of Onset  . Hypertension Father   . Coronary artery disease Father        MI/ Fatal  . Breast cancer Neg Hx   . Diabetes Neg Hx   . Colon cancer Neg Hx    Social History   Socioeconomic History  . Marital status: Divorced    Spouse name: Not on file  . Number of children: 2  . Years of education: 159 . Highest education level: Not on file  Occupational History    Employer: [employer]   Comment: retired  Tobacco Use  . Smoking status: Never Smoker  . Smokeless tobacco: Never Used  Substance and Sexual Activity  . Alcohol use: Not Currently  . Drug use: Not Currently  . Sexual activity: Not Currently  Other Topics Concern  . Not on file  Social History Narrative   [employer]-BS, has had additional course work.  married '71- after 29 years husband left, divorce final in '06.  1 son- '74, 1 daughter '76; 2 grandchildren. 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 Lives in her own home, daughter and grandchild home with her. ACP - HCPOA dtr - tiffany (c) [phone] - [id number]. CPR - ?, no mechanical ventilation except short term care, no prolonged artificial nutrition.    Social Determinants of Health   Financial Resource Strain:   . Difficulty of Paying Living Expenses: Not on file  Food Insecurity:   . Worried About Charity fundraiser in the Last Year: Not on file  . Ran Out of Food in the Last Year: Not on file  Transportation Needs:   . Lack of  Transportation (Medical): Not on file  . Lack of Transportation (Non-Medical): Not on file  Physical Activity:   . Days of Exercise per Week: Not on file  . Minutes of Exercise per Session: Not on file  Stress:   . Feeling of Stress : Not on file  Social Connections:   . Frequency of Communication with Friends and Family: Not on file  . Frequency of Social Gatherings with Friends and Family: Not on file  . Attends Religious Services: Not on file  . Active Member of Clubs or Organizations: Not on file  . Attends Archivist Meetings: Not on file  . Marital Status: Not on file    Outpatient Encounter Medications as of 04/27/2019  Medication Sig  . albuterol (PROVENTIL HFA;VENTOLIN HFA) 108 (90 Base) MCG/ACT inhaler Inhale 2 puffs into the lungs every 6 (six) hours as needed.  . Ascorbic Acid (VITAMIN C PO) Take 1 tablet by mouth daily.    Marland Kitchen BREO ELLIPTA 200-25 MCG/INH AEPB INHALE 1 PUFF PO QD  . clonazePAM (KLONOPIN) 0.5 MG tablet Take 1 tablet (0.5 mg total) by mouth daily as needed. for anxiety  . Cyanocobalamin (B-12 PO) Take by mouth daily.    Marland Kitchen dicyclomine (BENTYL) 20 MG tablet TAKE 1/2 TABLET BY MOUTH THREE TIMES DAILY BEFORE MEALS  . diltiazem (TIAZAC) 120 MG 24 hr capsule TAKE ONE CAPSULE BY MOUTH DAILY  . dorzolamide (TRUSOPT) 2 % ophthalmic solution Place into both eyes 2 (two) times daily.   Marland Kitchen escitalopram (LEXAPRO) 10 MG tablet Take 2 tablets (20 mg total) by mouth daily.  . fluticasone (FLONASE) 50 MCG/ACT nasal spray Place 2 sprays into both nostrils daily.  . furosemide (LASIX) 40 MG tablet TAKE 1 TABLET(40 MG) BY MOUTH DAILY  . gabapentin (NEURONTIN) 100 MG capsule Take 200 mg by mouth daily.  Marland Kitchen ibuprofen (ADVIL,MOTRIN) 800 MG tablet TAKE 1 TABLET(800 MG) BY MOUTH DAILY AS NEEDED FOR MODERATE PAIN  . ipratropium (ATROVENT) 0.06 % nasal spray Place 2 sprays into both nostrils 4 (four) times daily.  Marland Kitchen latanoprost (XALATAN) 0.005 % ophthalmic solution Place 1 drop  into both eyes daily.  Marland Kitchen levalbuterol (XOPENEX) 1.25 MG/3ML nebulizer solution Take 1.25 mg by nebulization as needed.  Marland Kitchen levocetirizine (XYZAL) 5 MG tablet TK 1 T PO QD IN THE EVE  . losartan (COZAAR) 100 MG tablet TAKE 1 TABLET(100 MG) BY MOUTH DAILY  . multivitamin (THERAGRAN) tablet Take 1 tablet by mouth daily.    Marland Kitchen omeprazole (PRILOSEC) 40 MG capsule TAKE 1 CAPSULE BY MOUTH EVERY DAY  . potassium chloride (KLOR-CON) 8 MEQ tablet Take 1 tablet (8 mEq total) by mouth daily.  . SUMAtriptan (IMITREX) 100 MG tablet Take 1 tablet (  100 mg total) by mouth once. Take 1 tablet at onset of headache  . [DISCONTINUED] diltiazem (DILACOR XR) 120 MG 24 hr capsule Take 1 capsule (120 mg total) by mouth daily.  . [DISCONTINUED] predniSONE (DELTASONE) 20 MG tablet Take 2 tablets (40 mg total) by mouth daily with breakfast. (Patient not taking: Reported on 04/27/2019)   No facility-administered encounter medications on file as of 04/27/2019.    Activities of Daily Living In your present state of health, do you have any difficulty performing the following activities: 04/27/2019  Hearing? N  Vision? N  Difficulty concentrating or making decisions? N  Walking or climbing stairs? N  Dressing or bathing? N  Doing errands, shopping? N  Preparing Food and eating ? N  Using the Toilet? N  In the past six months, have you accidently leaked urine? N  Do you have problems with loss of bowel control? N  Managing your Medications? N  Managing your Finances? N  Housekeeping or managing your Housekeeping? N  Some recent data might be hidden    Patient Care Team: Hoyt Koch, MD as PCP - General (Internal Medicine) Mosetta Anis, MD (Allergy) Romine, Lubertha South, MD (Obstetrics and Gynecology) Verner Chol, MD as Consulting Physician (Sports Medicine) Katy Apo, MD as Consulting Physician (Ophthalmology)    Assessment:   This is a routine wellness examination for Nocole. Physical  assessment deferred to PCP.   Exercise Activities and Dietary recommendations Current Exercise Habits: The patient does not participate in regular exercise at present, Exercise limited by: orthopedic condition(s)  Diet (meal preparation, eat out, water intake, caffeinated beverages, dairy products, fruits and vegetables): in general, a "healthy" diet  , well balanced   Reviewed heart healthy diet. Encouraged patient to increase daily water and healthy fluid intake.  Goals    . Patient Stated     I would like to remove the clutter from my house. I will continue to do what I can to relax and decrease my stress level. I am going to start to go to the [facility].     . Patient Stated     Continue to lose weight and get involved at the senior centers.    . Weight < 200 lb (90.719 kg)     Lose 7 lbs; more movement; having more energy; try water and try tai chi/ Based on pulmonary and may seek guidance from pulmonologist as she feels tried and gets sob       Fall Risk Fall Risk  04/27/2019 04/01/2018 03/31/2017 03/28/2016 02/27/2015  Falls in the past year? 0 1 Yes No No  Number falls in past yr: 0 0 - - -  Injury with Fall? 0 1 No - -  Risk for fall due to : Impaired balance/gait;Impaired mobility - - - -  Follow up Falls prevention discussed Falls prevention discussed - - -   Is the patient's home free of loose throw rugs in walkways, pet beds, electrical cords, etc?   yes      Grab bars in the bathroom? no      Handrails on the stairs?   yes      Adequate lighting?   yes   Depression Screen PHQ 2/9 Scores 04/27/2019 04/01/2018 03/31/2017 03/28/2016  PHQ - 2 Score _0 0  PHQ- 9 Score _1 -     Cognitive Function MMSE - Mini Mental State Exam 02/27/2015  Not completed: (No Data)  Ad8 score reviewed for issues:  Issues making decisions: no  Less interest in hobbies / activities: no  Repeats questions, stories (family complaining): no  Trouble using  ordinary gadgets (microwave, computer, phone):no  Forgets the month or year: no  Mismanaging finances: no  Remembering appts: no  Daily problems with thinking and/or memory: no Ad8 score is= 0  Immunization History  Administered Date(s) Administered  . Influenza Split 04/05/2011  . Influenza Whole 04/22/2007, 02/23/2008, 02/21/2009, 03/06/2009, 02/28/2010, 01/26/2014  . Influenza, High Dose Seasonal PF 03/06/2017  . Influenza,inj,Quad PF,6+ Mos 01/17/2016  . Influenza-Unspecified 02/21/2015, 02/10/2018  . Pneumococcal Conjugate-13 03/16/2015  . Pneumococcal Polysaccharide-23 05/29/2005, 04/05/2011  . Zoster 07/26/2009     Screening Tests Health Maintenance  Topic Date Due  . TETANUS/TDAP  07/13/2018  . MAMMOGRAM  03/18/2020  . COLONOSCOPY  12/25/2022  . INFLUENZA VACCINE  Completed  . DEXA SCAN  Completed  . Hepatitis C Screening  Completed  . PNA vac Low Risk Adult  Completed      Plan:    Reviewed health maintenance screenings with patient today and relevant education, vaccines, and/or referrals were provided.   Continue to eat heart healthy diet (full of fruits, vegetables, whole grains, lean protein, water--limit salt, fat, and sugar intake) and increase physical activity as tolerated.  Continue doing brain stimulating activities (puzzles, reading, adult coloring books, staying active) to keep memory sharp.   I have personally reviewed and noted the following in the patient's chart:   . Medical and social history . Use of alcohol, tobacco or illicit drugs  . Current medications and supplements . Functional ability and status . Nutritional status . Physical activity . Advanced directives . List of other physicians . Vitals . Screenings to include cognitive, depression, and falls . Referrals and appointments  In addition, I have reviewed and discussed with patient certain preventive protocols, quality metrics, and best practice recommendations. A written  personalized care plan for preventive services as well as general preventive health recommendations were provided to patient.     Michiel Cowboy, RN  04/27/2019

## 2019-04-27 ENCOUNTER — Encounter: Payer: PPO | Admitting: Internal Medicine

## 2019-04-27 ENCOUNTER — Ambulatory Visit (INDEPENDENT_AMBULATORY_CARE_PROVIDER_SITE_OTHER): Payer: PPO | Admitting: *Deleted

## 2019-04-27 ENCOUNTER — Ambulatory Visit: Payer: PPO

## 2019-04-27 DIAGNOSIS — Z0000 Encounter for general adult medical examination without abnormal findings: Secondary | ICD-10-CM

## 2019-04-27 NOTE — Progress Notes (Signed)
Medical screening examination/treatment/procedure(s) were performed by non-physician practitioner and as supervising physician I was immediately available for consultation/collaboration. I agree with above. Jaqwan Wieber A Shonita Rinck, MD 

## 2019-04-28 NOTE — Addendum Note (Signed)
Addended by: Emelia Loron A on: 04/28/2019 08:33 AM   Modules accepted: Orders

## 2019-05-03 ENCOUNTER — Ambulatory Visit (INDEPENDENT_AMBULATORY_CARE_PROVIDER_SITE_OTHER): Payer: PPO | Admitting: Internal Medicine

## 2019-05-03 ENCOUNTER — Other Ambulatory Visit: Payer: Self-pay

## 2019-05-03 ENCOUNTER — Encounter: Payer: Self-pay | Admitting: Internal Medicine

## 2019-05-03 VITALS — BP 144/88 | HR 78 | Temp 98.5°F | Ht 66.0 in | Wt 204.0 lb

## 2019-05-03 DIAGNOSIS — Z23 Encounter for immunization: Secondary | ICD-10-CM

## 2019-05-03 DIAGNOSIS — F411 Generalized anxiety disorder: Secondary | ICD-10-CM

## 2019-05-03 DIAGNOSIS — J4520 Mild intermittent asthma, uncomplicated: Secondary | ICD-10-CM

## 2019-05-03 DIAGNOSIS — K589 Irritable bowel syndrome without diarrhea: Secondary | ICD-10-CM | POA: Diagnosis not present

## 2019-05-03 DIAGNOSIS — E789 Disorder of lipoprotein metabolism, unspecified: Secondary | ICD-10-CM

## 2019-05-03 DIAGNOSIS — I10 Essential (primary) hypertension: Secondary | ICD-10-CM | POA: Diagnosis not present

## 2019-05-03 DIAGNOSIS — Z0000 Encounter for general adult medical examination without abnormal findings: Secondary | ICD-10-CM | POA: Diagnosis not present

## 2019-05-03 NOTE — Progress Notes (Signed)
   Subjective:   Patient ID: Dominique Johnston, female    DOB: May 12, 1946, 73 y.o.   MRN: [id number]  HPI The patient is a 73 YO female coming in for physical.   PMH, [place], social history reviewed and updated  Review of Systems  Constitutional: Negative.   HENT: Negative.   Eyes: Negative.   Respiratory: Negative for cough, chest tightness and shortness of breath.   Cardiovascular: Negative for chest pain, palpitations and leg swelling.  Gastrointestinal: Negative for abdominal distention, abdominal pain, constipation, diarrhea, nausea and vomiting.  Musculoskeletal: Positive for arthralgias and myalgias.  Skin: Negative.   Neurological: Negative.   Psychiatric/Behavioral: Negative.     Objective:  Physical Exam Constitutional:      Appearance: She is well-developed.  HENT:     Head: Normocephalic and atraumatic.  Cardiovascular:     Rate and Rhythm: Normal rate and regular rhythm.  Pulmonary:     Effort: Pulmonary effort is normal. No respiratory distress.     Breath sounds: Normal breath sounds. No wheezing or rales.  Abdominal:     General: Bowel sounds are normal. There is no distension.     Palpations: Abdomen is soft.     Tenderness: There is no abdominal tenderness. There is no rebound.  Musculoskeletal:        General: Tenderness present.     Cervical back: Normal range of motion.  Skin:    General: Skin is warm and dry.  Neurological:     Mental Status: She is alert and oriented to person, place, and time.     Coordination: Coordination normal.     Vitals:   05/03/19 1346  BP: (!) 144/88  Pulse: 78  Temp: 98.5 F (36.9 C)  TempSrc: Oral  SpO2: 98%  Weight: 204 lb (92.5 kg)  Height: 5\' 6"  (1.676 m)    This visit occurred during the SARS-CoV-2 public health emergency.  Safety protocols were in place, including screening questions prior to the visit, additional usage of staff PPE, and extensive cleaning of exam room while observing appropriate  contact time as indicated for disinfecting solutions.   Assessment & Plan:  Tdap given at visit

## 2019-05-03 NOTE — Patient Instructions (Signed)
Health Maintenance, Female Adopting a healthy lifestyle and getting preventive care are important in promoting health and wellness. Ask your health care provider about:  The right schedule for you to have regular tests and exams.  Things you can do on your own to prevent diseases and keep yourself healthy. What should I know about diet, weight, and exercise? Eat a healthy diet   Eat a diet that includes plenty of vegetables, fruits, low-fat dairy products, and lean protein.  Do not eat a lot of foods that are high in solid fats, added sugars, or sodium. Maintain a healthy weight Body mass index (BMI) is used to identify weight problems. It estimates body fat based on height and weight. Your health care provider can help determine your BMI and help you achieve or maintain a healthy weight. Get regular exercise Get regular exercise. This is one of the most important things you can do for your health. Most adults should:  Exercise for at least 150 minutes each week. The exercise should increase your heart rate and make you sweat (moderate-intensity exercise).  Do strengthening exercises at least twice a week. This is in addition to the moderate-intensity exercise.  Spend less time sitting. Even light physical activity can be beneficial. Watch cholesterol and blood lipids Have your blood tested for lipids and cholesterol at 73 years of age, then have this test every 5 years. Have your cholesterol levels checked more often if:  Your lipid or cholesterol levels are high.  You are older than 73 years of age.  You are at high risk for heart disease. What should I know about cancer screening? Depending on your health history and family history, you may need to have cancer screening at various ages. This may include screening for:  Breast cancer.  Cervical cancer.  Colorectal cancer.  Skin cancer.  Lung cancer. What should I know about heart disease, diabetes, and high blood  pressure? Blood pressure and heart disease  High blood pressure causes heart disease and increases the risk of stroke. This is more likely to develop in people who have high blood pressure readings, are of African descent, or are overweight.  Have your blood pressure checked: ? Every 3-5 years if you are 18-39 years of age. ? Every year if you are 40 years old or older. Diabetes Have regular diabetes screenings. This checks your fasting blood sugar level. Have the screening done:  Once every three years after age 40 if you are at a normal weight and have a low risk for diabetes.  More often and at a younger age if you are overweight or have a high risk for diabetes. What should I know about preventing infection? Hepatitis B If you have a higher risk for hepatitis B, you should be screened for this virus. Talk with your health care provider to find out if you are at risk for hepatitis B infection. Hepatitis C Testing is recommended for:  Everyone born from 1945 through 1965.  Anyone with known risk factors for hepatitis C. Sexually transmitted infections (STIs)  Get screened for STIs, including gonorrhea and chlamydia, if: ? You are sexually active and are younger than 73 years of age. ? You are older than 73 years of age and your health care provider tells you that you are at risk for this type of infection. ? Your sexual activity has changed since you were last screened, and you are at increased risk for chlamydia or gonorrhea. Ask your health care provider if   you are at risk.  Ask your health care provider about whether you are at high risk for HIV. Your health care provider may recommend a prescription medicine to help prevent HIV infection. If you choose to take medicine to prevent HIV, you should first get tested for HIV. You should then be tested every 3 months for as long as you are taking the medicine. Pregnancy  If you are about to stop having your period (premenopausal) and  you may become pregnant, seek counseling before you get pregnant.  Take 400 to 800 micrograms (mcg) of folic acid every day if you become pregnant.  Ask for birth control (contraception) if you want to prevent pregnancy. Osteoporosis and menopause Osteoporosis is a disease in which the bones lose minerals and strength with aging. This can result in bone fractures. If you are 65 years old or older, or if you are at risk for osteoporosis and fractures, ask your health care provider if you should:  Be screened for bone loss.  Take a calcium or vitamin D supplement to lower your risk of fractures.  Be given hormone replacement therapy (HRT) to treat symptoms of menopause. Follow these instructions at home: Lifestyle  Do not use any products that contain nicotine or tobacco, such as cigarettes, e-cigarettes, and chewing tobacco. If you need help quitting, ask your health care provider.  Do not use street drugs.  Do not share needles.  Ask your health care provider for help if you need support or information about quitting drugs. Alcohol use  Do not drink alcohol if: ? Your health care provider tells you not to drink. ? You are pregnant, may be pregnant, or are planning to become pregnant.  If you drink alcohol: ? Limit how much you use to 0-1 drink a day. ? Limit intake if you are breastfeeding.  Be aware of how much alcohol is in your drink. In the [place] one drink equals one 12 oz bottle of beer (355 mL), one 5 oz glass of wine (148 mL), or one 1 oz glass of hard liquor (44 mL). General instructions  Schedule regular health, dental, and eye exams.  Stay current with your vaccines.  Tell your health care provider if: ? You often feel depressed. ? You have ever been abused or do not feel safe at home. Summary  Adopting a healthy lifestyle and getting preventive care are important in promoting health and wellness.  Follow your health care provider's instructions about healthy  diet, exercising, and getting tested or screened for diseases.  Follow your health care provider's instructions on monitoring your cholesterol and blood pressure. This information is not intended to replace advice given to you by your health care provider. Make sure you discuss any questions you have with your health care provider. Document Released: 11/12/2010 Document Revised: 04/22/2018 Document Reviewed: 04/22/2018 Elsevier Patient Education  2020 Elsevier Inc.  

## 2019-05-04 NOTE — Assessment & Plan Note (Signed)
BP at goal on diltiazem, lasix, losartan. Recent CMP without indication for change.

## 2019-05-04 NOTE — Assessment & Plan Note (Signed)
Slightly worse due to covid-19 but coping okay. No need for change in treatment but advised on coping skills.

## 2019-05-04 NOTE — Assessment & Plan Note (Signed)
Flu shot up to date. Pneumonia complete. Shingrix counseled. Tetanus up to date. Colonoscopy up to date. Mammogram up to date, pap smear aged out and dexa up to date. Counseled about sun safety and mole surveillance. Counseled about the dangers of distracted driving. Given 10 year screening recommendations.  

## 2019-05-04 NOTE — Assessment & Plan Note (Signed)
Uses bentyl rarely and having some increased problems with this with the increased stress.

## 2019-05-04 NOTE — Assessment & Plan Note (Signed)
Recent lipid panel at goal without medications. LDL goal <130.

## 2019-05-04 NOTE — Assessment & Plan Note (Signed)
No flare, uses breo prn and albuterol prn. Uses flonase and allergy medications year round.

## 2019-05-08 ENCOUNTER — Ambulatory Visit
Admission: RE | Admit: 2019-05-08 | Discharge: 2019-05-08 | Disposition: A | Discharge status: Home / Self Care | Payer: PPO | Source: Ambulatory Visit | Attending: Sports Medicine | Admitting: Sports Medicine

## 2019-05-08 ENCOUNTER — Other Ambulatory Visit: Payer: Self-pay

## 2019-05-08 DIAGNOSIS — M545 Low back pain, unspecified: Secondary | ICD-10-CM

## 2019-05-08 DIAGNOSIS — M48061 Spinal stenosis, lumbar region without neurogenic claudication: Secondary | ICD-10-CM | POA: Diagnosis not present

## 2019-05-08 DIAGNOSIS — M47816 Spondylosis without myelopathy or radiculopathy, lumbar region: Secondary | ICD-10-CM

## 2019-05-19 DIAGNOSIS — M5416 Radiculopathy, lumbar region: Secondary | ICD-10-CM | POA: Diagnosis not present

## 2019-05-24 ENCOUNTER — Encounter: Payer: Self-pay | Admitting: Internal Medicine

## 2019-05-24 ENCOUNTER — Other Ambulatory Visit: Payer: Self-pay | Admitting: Internal Medicine

## 2019-05-24 NOTE — Telephone Encounter (Signed)
Medication Refill - Medication: diltiazem (TIAZAC) 120 MG 24 hr capsule  Pt is out of medication completely has been waiting since last week to receive refill, has already used emergency supply at pharmacy. Is requesting refill for as soon as possible  Has the patient contacted their pharmacy? Yes.   (Agent: If no, request that the patient contact the pharmacy for the refill.) (Agent: If yes, when and what did the pharmacy advise?)  Preferred Pharmacy (with phone number or street name):  [id number] - [place], [place] - [address] AT [facility] OF [address]  [address] [place] [address]  Phone: [phone] Fax: [phone]      Agent: Please be advised that RX refills may take up to 3 business days. We ask that you follow-up with your pharmacy.

## 2019-05-24 NOTE — Telephone Encounter (Signed)
review for refill of diltiazem 120 mg. prescription expired on 04/01/2019. Dr. [name]

## 2019-05-25 MED ORDER — DILTIAZEM HCL ER BEADS 120 MG PO CP24: ORAL_CAPSULE | ORAL | 3 refills | Status: DC

## 2019-05-28 DIAGNOSIS — J300 Vasomotor rhinitis: Secondary | ICD-10-CM | POA: Diagnosis not present

## 2019-05-28 DIAGNOSIS — K219 Gastro-esophageal reflux disease without esophagitis: Secondary | ICD-10-CM | POA: Diagnosis not present

## 2019-05-28 DIAGNOSIS — J019 Acute sinusitis, unspecified: Secondary | ICD-10-CM | POA: Diagnosis not present

## 2019-05-28 DIAGNOSIS — J4530 Mild persistent asthma, uncomplicated: Secondary | ICD-10-CM | POA: Diagnosis not present

## 2019-06-02 DIAGNOSIS — M5416 Radiculopathy, lumbar region: Secondary | ICD-10-CM | POA: Diagnosis not present

## 2019-06-03 ENCOUNTER — Other Ambulatory Visit: Payer: Self-pay | Admitting: Internal Medicine

## 2019-06-03 ENCOUNTER — Other Ambulatory Visit: Payer: Self-pay | Admitting: *Deleted

## 2019-06-03 MED ORDER — OMEPRAZOLE 40 MG PO CPDR: DELAYED_RELEASE_CAPSULE | ORAL | 3 refills | Status: DC

## 2019-06-03 MED ORDER — POTASSIUM CHLORIDE ER 8 MEQ PO TBCR: 8.0000 meq | EXTENDED_RELEASE_TABLET | Freq: Every day | ORAL | 3 refills | Status: DC

## 2019-06-03 NOTE — Telephone Encounter (Signed)
Refill sent. See meds.  

## 2019-06-03 NOTE — Telephone Encounter (Signed)
Patient is requesting a refill on the following medication.  potassium chloride (KLOR-CON) 8 MEQ tablet   Please send to pharmacy on file.

## 2019-06-15 ENCOUNTER — Other Ambulatory Visit: Payer: Self-pay

## 2019-06-15 ENCOUNTER — Ambulatory Visit
Admission: RE | Admit: 2019-06-15 | Discharge: 2019-06-15 | Disposition: A | Discharge status: Home / Self Care | Payer: PPO | Source: Ambulatory Visit | Attending: Internal Medicine | Admitting: Internal Medicine

## 2019-06-15 DIAGNOSIS — Z1231 Encounter for screening mammogram for malignant neoplasm of breast: Secondary | ICD-10-CM

## 2019-06-20 ENCOUNTER — Encounter: Payer: Self-pay | Admitting: Internal Medicine

## 2019-06-21 MED ORDER — ESCITALOPRAM OXALATE 10 MG PO TABS: 20.0000 mg | ORAL_TABLET | Freq: Every day | ORAL | 1 refills | Status: DC

## 2019-07-15 ENCOUNTER — Other Ambulatory Visit: Payer: Self-pay | Admitting: Internal Medicine

## 2019-07-28 DIAGNOSIS — M5416 Radiculopathy, lumbar region: Secondary | ICD-10-CM | POA: Diagnosis not present

## 2019-07-28 DIAGNOSIS — M25552 Pain in left hip: Secondary | ICD-10-CM | POA: Diagnosis not present

## 2019-09-21 ENCOUNTER — Ambulatory Visit (HOSPITAL_COMMUNITY)
Admission: EM | Admit: 2019-09-21 | Discharge: 2019-09-21 | Disposition: A | Discharge status: Home / Self Care | Payer: PPO | Attending: Family Medicine | Admitting: Family Medicine

## 2019-09-21 ENCOUNTER — Telehealth: Payer: Self-pay | Admitting: Internal Medicine

## 2019-09-21 ENCOUNTER — Other Ambulatory Visit: Payer: Self-pay

## 2019-09-21 ENCOUNTER — Encounter (HOSPITAL_COMMUNITY): Payer: Self-pay

## 2019-09-21 ENCOUNTER — Ambulatory Visit (INDEPENDENT_AMBULATORY_CARE_PROVIDER_SITE_OTHER): Payer: PPO

## 2019-09-21 DIAGNOSIS — E785 Hyperlipidemia, unspecified: Secondary | ICD-10-CM | POA: Insufficient documentation

## 2019-09-21 DIAGNOSIS — R0789 Other chest pain: Secondary | ICD-10-CM | POA: Diagnosis not present

## 2019-09-21 DIAGNOSIS — F411 Generalized anxiety disorder: Secondary | ICD-10-CM | POA: Insufficient documentation

## 2019-09-21 DIAGNOSIS — Z881 Allergy status to other antibiotic agents status: Secondary | ICD-10-CM | POA: Insufficient documentation

## 2019-09-21 DIAGNOSIS — J4520 Mild intermittent asthma, uncomplicated: Secondary | ICD-10-CM | POA: Insufficient documentation

## 2019-09-21 DIAGNOSIS — Z8249 Family history of ischemic heart disease and other diseases of the circulatory system: Secondary | ICD-10-CM | POA: Insufficient documentation

## 2019-09-21 DIAGNOSIS — Z20822 Contact with and (suspected) exposure to covid-19: Secondary | ICD-10-CM | POA: Insufficient documentation

## 2019-09-21 DIAGNOSIS — K589 Irritable bowel syndrome without diarrhea: Secondary | ICD-10-CM | POA: Diagnosis not present

## 2019-09-21 DIAGNOSIS — I10 Essential (primary) hypertension: Secondary | ICD-10-CM | POA: Insufficient documentation

## 2019-09-21 DIAGNOSIS — Z7951 Long term (current) use of inhaled steroids: Secondary | ICD-10-CM | POA: Insufficient documentation

## 2019-09-21 DIAGNOSIS — R0602 Shortness of breath: Secondary | ICD-10-CM

## 2019-09-21 DIAGNOSIS — K219 Gastro-esophageal reflux disease without esophagitis: Secondary | ICD-10-CM | POA: Insufficient documentation

## 2019-09-21 DIAGNOSIS — Z79899 Other long term (current) drug therapy: Secondary | ICD-10-CM | POA: Diagnosis not present

## 2019-09-21 DIAGNOSIS — J0100 Acute maxillary sinusitis, unspecified: Secondary | ICD-10-CM | POA: Insufficient documentation

## 2019-09-21 DIAGNOSIS — F329 Major depressive disorder, single episode, unspecified: Secondary | ICD-10-CM | POA: Insufficient documentation

## 2019-09-21 DIAGNOSIS — R062 Wheezing: Secondary | ICD-10-CM | POA: Diagnosis not present

## 2019-09-21 DIAGNOSIS — Z791 Long term (current) use of non-steroidal anti-inflammatories (NSAID): Secondary | ICD-10-CM | POA: Insufficient documentation

## 2019-09-21 DIAGNOSIS — E7800 Pure hypercholesterolemia, unspecified: Secondary | ICD-10-CM | POA: Insufficient documentation

## 2019-09-21 DIAGNOSIS — R05 Cough: Secondary | ICD-10-CM

## 2019-09-21 LAB — SARS CORONAVIRUS 2 (TAT 6-24 HRS): SARS Coronavirus 2: NEGATIVE

## 2019-09-21 MED ORDER — BENZONATATE 100 MG PO CAPS
100.0000 mg | ORAL_CAPSULE | Freq: Three times a day (TID) | ORAL | 0 refills | Status: DC
Start: 2019-09-21 — End: 2019-11-01

## 2019-09-21 MED ORDER — AMOXICILLIN-POT CLAVULANATE 875-125 MG PO TABS: 1.0000 | ORAL_TABLET | Freq: Two times a day (BID) | ORAL | 0 refills | Status: AC

## 2019-09-21 MED ORDER — SALINE SPRAY 0.65 % NA SOLN
1.0000 | NASAL | 0 refills | Status: DC | PRN
Start: 2019-09-21 — End: 2022-08-07

## 2019-09-21 NOTE — ED Triage Notes (Signed)
Pt reports nasal congestion, post nasal drip, and cough x2.5 wks. Reports hx asthma.

## 2019-09-21 NOTE — ED Provider Notes (Signed)
[facility]    CSN: [id number] Arrival date & time: 09/21/19  1734      History   Chief Complaint Chief Complaint  Patient presents with  . Nasal Congestion  . Cough    HPI Dominique Johnston is a 74 y.o. female.   Patient reports urgent care for evaluation 2.5 weeks of congestion, postnasal drip and cough.  She reports her cough has been productive with yellow phlegm.  She reports the cough is worse at night.  She reports significant coughing fits that causes some chest discomfort and a brief notice of shortness of breath.  She reports she has had constant nasal congestion and nasal drainage down the back of her throat for at least the last 2-1/2 weeks.  She reports she has had some facial pain and headaches.  She has had occasional chills.  She does report a history of asthma has been using her inhaler lately, however she has not had significant wheezing.  She denies any nausea, vomiting or diarrhea.  She denies significant sore throat.  She reports she is taking all her blood pressure medications.  Denies any chest pains today.  She reports she has received both her Covid vaccines.     Repeat bp 162/82     Past Medical History:  Diagnosis Date  . Allergy    rhinitis  . Asthma   . Bouchard nodes (DJD hand)   . Bronchitis    recurrent  . Cataract   . Depression   . DJD (degenerative joint disease) of knee   . DJD (degenerative joint disease), lumbosacral   . GERD (gastroesophageal reflux disease)   . Headache(784.0)   . Headache(784.0)   . Hyperlipidemia   . Hypertension   . IBS (irritable bowel syndrome)   . Sinusitis, chronic     Patient Active Problem List   Diagnosis Date Noted  . Left hip pain 03/22/2019  . Mild intermittent asthma 10/08/2016  . Routine health maintenance 10/25/2012  . Borderline high cholesterol 01/19/2010  . G E REFLUX 12/10/2007  . Generalized anxiety disorder 12/04/2007  . Essential hypertension 12/01/2006  .  Allergic rhinitis 12/01/2006  . IRRITABLE BOWEL SYNDROME 12/01/2006  . Osteoarthritis 12/01/2006    Past Surgical History:  Procedure Laterality Date  . BREAST EXCISIONAL BIOPSY Left 1998  . CATARACT EXTRACTION Right    04/21/18  . ORIF ANKLE FRACTURE  '07   Left ankle  . SEPTOPLASTY  2000  . TONSILLECTOMY    . transnasal laryngoscopy  2005  . TUBAL LIGATION  1977    OB History   No obstetric history on file.      Home Medications    Prior to Admission medications   Medication Sig Start Date End Date Taking? Authorizing Provider  albuterol (PROVENTIL HFA;VENTOLIN HFA) 108 (90 Base) MCG/ACT inhaler Inhale 2 puffs into the lungs every 6 (six) hours as needed. 03/28/16   [name], MD  amoxicillin-clavulanate (AUGMENTIN) 875-125 MG tablet Take 1 tablet by mouth 2 (two) times daily for 10 days. 09/21/19 10/01/19  Meng Winterton, Veryl Speak, PA-C  Ascorbic Acid (VITAMIN C PO) Take 1 tablet by mouth daily.      [provider]  benzonatate (TESSALON) 100 MG capsule Take 1 capsule (100 mg total) by mouth every 8 (eight) hours. 09/21/19   Dafna Romo, Veryl Speak, PA-C  BREO ELLIPTA 200-25 MCG/INH AEPB INHALE 1 PUFF PO QD 02/27/16   [provider]  clonazePAM (KLONOPIN) 0.5 MG tablet Take 1  tablet (0.5 mg total) by mouth daily as needed. for anxiety 04/01/19   [name], MD  Cyanocobalamin (B-12 PO) Take by mouth daily.      [provider]  dicyclomine (BENTYL) 20 MG tablet TAKE 1/2 TABLET BY MOUTH THREE TIMES DAILY BEFORE MEALS 07/15/19   [name], MD  diltiazem [facility]) 120 MG 24 hr capsule TAKE ONE CAPSULE BY MOUTH DAILY 05/25/19   [name], MD  dorzolamide (TRUSOPT) 2 % ophthalmic solution Place into both eyes 2 (two) times daily.  01/13/14   [provider]  escitalopram (LEXAPRO) 10 MG tablet Take 2 tablets (20 mg total) by mouth daily. 06/21/19   [name], MD  fluticasone Aleda Grana) 50 MCG/ACT nasal spray Place 2  sprays into both nostrils daily. 05/17/18   Cathie Hoops, Amy V, PA-C  furosemide (LASIX) 40 MG tablet TAKE 1 TABLET(40 MG) BY MOUTH DAILY 04/01/18   [name], MD  ibuprofen (ADVIL,MOTRIN) 800 MG tablet TAKE 1 TABLET(800 MG) BY MOUTH DAILY AS NEEDED FOR MODERATE PAIN 04/01/18   [name], MD  ipratropium (ATROVENT) 0.06 % nasal spray Place 2 sprays into both nostrils 4 (four) times daily. 05/17/18   Cathie Hoops, Amy V, PA-C  latanoprost (XALATAN) 0.005 % ophthalmic solution Place 1 drop into both eyes daily. 10/22/12   Norins, Rosalyn Gess, MD  levalbuterol Pauline Aus) 1.25 MG/3ML nebulizer solution Take 1.25 mg by nebulization as needed. 10/22/12   Norins, Rosalyn Gess, MD  levocetirizine (XYZAL) 5 MG tablet TK 1 T PO QD IN THE EVE 02/23/18   [provider]  losartan (COZAAR) 100 MG tablet TAKE 1 TABLET BY MOUTH EVERY DAY 07/16/19   [name], MD  multivitamin [facility]) tablet Take 1 tablet by mouth daily.      [provider]  omeprazole (PRILOSEC) 40 MG capsule TAKE 1 CAPSULE BY MOUTH EVERY DAY 06/03/19   [name], MD  potassium chloride (KLOR-CON) 8 MEQ tablet Take 1 tablet (8 mEq total) by mouth daily. 06/03/19   [name], MD  sodium chloride (OCEAN) 0.65 % SOLN nasal spray Place 1 spray into both nostrils as needed for congestion. 09/21/19   Vignesh Willert, Veryl Speak, PA-C  SUMAtriptan (IMITREX) 100 MG tablet Take 1 tablet (100 mg total) by mouth once. Take 1 tablet at onset of headache 03/08/16 03/28/16  [name], MD  diltiazem (DILACOR XR) 120 MG 24 hr capsule Take 1 capsule (120 mg total) by mouth daily. 10/22/11 10/11/12  Norins, Rosalyn Gess, MD    Family History Family History  Problem Relation Age of Onset  . Hypertension Father   . Coronary artery disease Father        MI/ Fatal  . Breast cancer Neg Hx   . Diabetes Neg Hx   . Colon cancer Neg Hx     Social History Social History   Tobacco Use  . Smoking status: Never Smoker  .  Smokeless tobacco: Never Used  Substance Use Topics  . Alcohol use: Not Currently  . Drug use: Not Currently     Allergies   Enalapril maleate and Sulfamethoxazole-trimethoprim   Review of Systems Review of Systems Per HPI  Physical Exam Triage Vital Signs ED Triage Vitals  Enc Vitals Group     BP 09/21/19 1752 (!) 180/77     Pulse Rate 09/21/19 1752 61     Resp 09/21/19 1752 16     Temp 09/21/19 1752 98.7 F (37.1 C)  Temp Source 09/21/19 1752 Oral     SpO2 09/21/19 1752 97 %     Weight --      Height --      Head Circumference --      Peak Flow --      Pain Score 09/21/19 1749 4     Pain Loc --      Pain Edu? --      Excl. in [place]? --    No data found.  Updated Vital Signs BP (!) 180/77 (BP Location: Left Arm) Comment: second attempt  Pulse 61   Temp 98.7 F (37.1 C) (Oral)   Resp 16   SpO2 97%   Repeat blood pressure by provider 162/82  Visual Acuity Right Eye Distance:   Left Eye Distance:   Bilateral Distance:    Right Eye Near:   Left Eye Near:    Bilateral Near:     Physical Exam Vitals and nursing note reviewed.  Constitutional:      General: She is not in acute distress.    Appearance: She is well-developed. She is not ill-appearing.  HENT:     Head: Normocephalic and atraumatic.     Comments: Bilateral maxillary facial tenderness    Right Ear: Tympanic membrane normal.     Left Ear: Tympanic membrane normal.     Nose: Congestion and rhinorrhea present.     Mouth/Throat:     Mouth: Mucous membranes are moist.     Comments: Postnasal drip visible in the oropharynx Eyes:     Conjunctiva/sclera: Conjunctivae normal.  Cardiovascular:     Rate and Rhythm: Normal rate and regular rhythm.     Heart sounds: No murmur.  Pulmonary:     Effort: Pulmonary effort is normal. No respiratory distress.     Breath sounds: Normal breath sounds. No wheezing, rhonchi or rales.     Comments: There is some upper airway sound resignation in the lungs.   She is speaking in full sentences without issue.  No tachypnea.  Saturating 97% on room air Abdominal:     Palpations: Abdomen is soft.     Tenderness: There is no abdominal tenderness.  Musculoskeletal:     Cervical back: Neck supple.  Skin:    General: Skin is warm and dry.  Neurological:     Mental Status: She is alert.      UC Treatments / Results  Labs (all labs ordered are listed, but only abnormal results are displayed) Labs Reviewed  SARS CORONAVIRUS 2 (TAT 6-24 HRS)    EKG   Radiology DG Chest 2 View  Result Date: 09/21/2019 CLINICAL DATA:  Shortness of breath, wheezing, cough EXAM: CHEST - 2 VIEW COMPARISON:  04/13/2017 FINDINGS: Heart is borderline enlarged. Tortuous thoracic aorta. No confluent opacities or effusions. No acute bony abnormality. IMPRESSION: No active cardiopulmonary disease. Electronically Signed   By: Rolm Baptise M.D.   On: 09/21/2019 18:50    Procedures Procedures (including critical care time)  Medications Ordered in UC Medications - No data to display  Initial Impression / Assessment and Plan / UC Course  I have reviewed the triage vital signs and the nursing notes.  Pertinent labs & imaging results that were available during my care of the patient were reviewed by me and considered in my medical decision making (see chart for details).  Clinical Course as of Sep 21 2335  Tue Sep 21, 2019  2332 Repeat blood pressure by provider 162/82  BP(!): 180/77 [JD]  Clinical Course User Index [JD] Ammiel Guiney, Veryl Speak, PA-C    #Acute maxillary sinusitis Patient is a 74 year old female presenting with clinical history and exam consistent with maxillary sinusitis.  Given she had a productive cough reports shortness of breath chest x-ray was taken.  No sign of pneumonia.  Believe that her cough is driven by postnasal drip.  Given duration of symptoms, worsening cough and facial pain will start on Augmentin and give Tessalon for cough.  I also  recommended nasal saline to aid in sinus congestion.  We will also do a Covid test to rule this out.  Recommend patient have follow-up with primary care in 7 days to have blood pressure reevaluated.  He is asymptomatic from blood pressure standpoint in clinic.  Strict emergency department return precautions were discussed.  Patient verbalized understanding plan. Final Clinical Impressions(s) / UC Diagnoses   Final diagnoses:  Acute non-recurrent maxillary sinusitis     Discharge Instructions     Take the augmentin 2 times a day for 10 days. Take the tessalon for cough up to every 8 hours  Schedule follow up with your Primary care in 7 days to have blood pressure checked and follow up on todays visit  If you have shortness of breath, chest pain or high fever, please go to the Emergency Deparment        ED Prescriptions    Medication Sig Dispense Auth. Provider   amoxicillin-clavulanate (AUGMENTIN) 875-125 MG tablet Take 1 tablet by mouth 2 (two) times daily for 10 days. 20 tablet Myer Bohlman, Veryl Speak, PA-C   benzonatate (TESSALON) 100 MG capsule Take 1 capsule (100 mg total) by mouth every 8 (eight) hours. 21 capsule Jhostin Epps, Veryl Speak, PA-C   sodium chloride (OCEAN) 0.65 % SOLN nasal spray Place 1 spray into both nostrils as needed for congestion. 44 mL Jarmon Javid, Veryl Speak, PA-C     PDMP not reviewed this encounter.   Hermelinda Medicus, PA-C 09/21/19 2341

## 2019-09-21 NOTE — Progress Notes (Signed)
  Chronic Care Management   Note  09/21/2019 Name: Jessy Calixte MRN: [id number] DOB: 1946-01-21  Shiann Kam Kiger is a 74 y.o. year old female who is a primary care patient of [name], MD. I reached out to Adair Laundry by phone today in response to a referral sent by Ms. Boneta Lucks Verhoeven's PCP, [name], MD.   Ms. Weill was given information about Chronic Care Management services today including:  1. CCM service includes personalized support from designated clinical staff supervised by her physician, including individualized plan of care and coordination with other care providers 2. 24/7 contact phone numbers for assistance for urgent and routine care needs. 3. Service will only be billed when office clinical staff spend 20 minutes or more in a month to coordinate care. 4. Only one practitioner may furnish and bill the service in a calendar month. 5. The patient may stop CCM services at any time (effective at the end of the month) by phone call to the office staff.   Patient agreed to services and verbal consent obtained.   This note is not being shared with the patient for the following reason: To respect privacy (The patient or proxy has requested that the information not be shared).  Follow up plan:   Lynnae January Upstream Scheduler

## 2019-09-21 NOTE — Discharge Instructions (Addendum)
Take the augmentin 2 times a day for 10 days. Take the tessalon for cough up to every 8 hours  Schedule follow up with your Primary care in 7 days to have blood pressure checked and follow up on todays visit  If you have shortness of breath, chest pain or high fever, please go to the Emergency Deparment

## 2019-10-15 ENCOUNTER — Other Ambulatory Visit: Payer: Self-pay | Admitting: Internal Medicine

## 2019-10-26 ENCOUNTER — Telehealth: Payer: Self-pay

## 2019-10-26 MED ORDER — FUROSEMIDE 40 MG PO TABS: ORAL_TABLET | ORAL | 0 refills | Status: DC

## 2019-10-26 NOTE — Telephone Encounter (Signed)
1.Medication Requested:furosemide (LASIX) 40 MG tablet  2. Pharmacy (Name, Street, City):[facility] [phone] - [place], [place] - [address] AT [facility] OF [address]  3. On Med List:  Yes   4. Last Visit with PCP: 12.21.20   5. Next visit date with PCP: 6.21.21    Agent: Please be advised that RX refills may take up to 3 business days. We ask that you follow-up with your pharmacy.

## 2019-11-01 ENCOUNTER — Ambulatory Visit (INDEPENDENT_AMBULATORY_CARE_PROVIDER_SITE_OTHER): Payer: PPO | Admitting: Internal Medicine

## 2019-11-01 ENCOUNTER — Encounter: Payer: Self-pay | Admitting: Internal Medicine

## 2019-11-01 ENCOUNTER — Other Ambulatory Visit: Payer: Self-pay

## 2019-11-01 DIAGNOSIS — J4520 Mild intermittent asthma, uncomplicated: Secondary | ICD-10-CM | POA: Diagnosis not present

## 2019-11-01 DIAGNOSIS — I10 Essential (primary) hypertension: Secondary | ICD-10-CM

## 2019-11-01 DIAGNOSIS — F411 Generalized anxiety disorder: Secondary | ICD-10-CM | POA: Diagnosis not present

## 2019-11-01 MED ORDER — IBUPROFEN 800 MG PO TABS: ORAL_TABLET | ORAL | 0 refills | Status: DC

## 2019-11-01 NOTE — Assessment & Plan Note (Signed)
Overall well controlled with breo and rare albuterol. No flare today.

## 2019-11-01 NOTE — Assessment & Plan Note (Signed)
BP at goal on diltiazem, lasix, losartan. Recent BMP at goal without indication for change.

## 2019-11-01 NOTE — Assessment & Plan Note (Signed)
Overall stable with lexapro and rare clonazepam. She is working on coping strategies to help with ongoing changes.

## 2019-11-01 NOTE — Progress Notes (Signed)
   Subjective:   Patient ID: Dominique Johnston, female    DOB: 07/13/1945, 74 y.o.   MRN: [id number]  HPI The patient is a 74 YO female coming in for follow up blood pressure (readings high at recent urgent care visit for sinus infection, overall feeling well at home, taking her BP meds regularly including diltiazem, lasix, losartan, denies headaches or chest pains), and mood (using lexapro 20 mg daily and rare clonazepam, denies SI/HI, still adjusting to loss of mother last year and covid-19 pandemic, feels she is coping okay overall), and asthma (taking mucinex at night which helps, some night time coughing, no SOB night time, some SOB with exertion but activity is down overall due to leg pain/low back problems, denies using albuterol inhaler much lately).   Review of Systems  Constitutional: Negative.   HENT: Negative.   Eyes: Negative.   Respiratory: Negative for cough, chest tightness and shortness of breath.   Cardiovascular: Negative for chest pain, palpitations and leg swelling.  Gastrointestinal: Negative for abdominal distention, abdominal pain, constipation, diarrhea, nausea and vomiting.  Musculoskeletal: Negative.   Skin: Negative.   Neurological: Negative.   Psychiatric/Behavioral: Negative.     Objective:  Physical Exam Constitutional:      Appearance: She is well-developed.  HENT:     Head: Normocephalic and atraumatic.  Cardiovascular:     Rate and Rhythm: Normal rate and regular rhythm.  Pulmonary:     Effort: Pulmonary effort is normal. No respiratory distress.     Breath sounds: Normal breath sounds. No wheezing or rales.  Abdominal:     General: Bowel sounds are normal. There is no distension.     Palpations: Abdomen is soft.     Tenderness: There is no abdominal tenderness. There is no rebound.  Musculoskeletal:     Cervical back: Normal range of motion.  Skin:    General: Skin is warm and dry.  Neurological:     Mental Status: She is alert and  oriented to person, place, and time.     Coordination: Coordination normal.     Vitals:   11/01/19 1034  BP: 136/82  Pulse: 70  Temp: 99.4 F (37.4 C)  TempSrc: Oral  SpO2: 94%  Weight: 201 lb (91.2 kg)  Height: 5\' 6"  (1.676 m)    This visit occurred during the SARS-CoV-2 public health emergency.  Safety protocols were in place, including screening questions prior to the visit, additional usage of staff PPE, and extensive cleaning of exam room while observing appropriate contact time as indicated for disinfecting solutions.   Assessment & Plan:

## 2019-11-01 NOTE — Patient Instructions (Signed)
You are doing well and we are not making any changes today.

## 2019-11-10 ENCOUNTER — Other Ambulatory Visit: Payer: Self-pay | Admitting: Internal Medicine

## 2019-11-11 NOTE — Chronic Care Management (AMB) (Deleted)
Chronic Care Management Pharmacy  Name: Dominique Johnston  MRN: [id number] DOB: [birth year]   Chief Complaint/ HPI  Dominique Johnston,  74 y.o. , female presents for their Initial CCM visit with the clinical pharmacist via telephone due to COVID-19 Pandemic.  PCP : Dominique Koch, MD Patient Care Team: Dominique Koch, MD as PCP - General (Internal Medicine) Dominique Anis, MD (Allergy) Dominique Johnston, Dominique South, MD (Obstetrics and Gynecology) Dominique Chol, MD as Consulting Physician (Sports Medicine) Dominique Apo, MD as Consulting Physician (Ophthalmology) Dominique Johnston, [facility] as Pharmacist (Pharmacist)  Their chronic conditions include: Hypertension, Hyperlipidemia, GERD, Asthma, Anxiety and Osteoarthritis   Office Visits: 11/01/19: Patient presented to Dr. Sharlet Johnston for follow-up. Benzonatate stopped (patient no longer taking)  Consult Visit: 09/21/19: Patient presented to ED for acute sinusitis. Patient discharged with Augmentin x 10 days, benzonatate, saline 0.65% nasal spray.  07/28/19: Patient presented to Dr. Ron Johnston (physical medicine and rehabilitation). Patient received dexamethasone injection.  06/02/19: Patient presented to Dr. Layne Johnston (sports medicine) for radiculopathy 05/28/19: Patient presented to Dr. Donneta Johnston (allergy) for follow-up.   Allergies  Allergen Reactions  . Enalapril Maleate     REACTION: swelling, headache  . Sulfamethoxazole-Trimethoprim     Abdominal pain, redness eyes    Medications: Outpatient Encounter Medications as of 11/12/2019  Medication Sig Note  . albuterol (PROVENTIL HFA;VENTOLIN HFA) 108 (90 Base) MCG/ACT inhaler Inhale 2 puffs into the lungs every 6 (six) hours as needed.   . Ascorbic Acid (VITAMIN C PO) Take 1 tablet by mouth daily.     Marland Kitchen BREO ELLIPTA 200-25 MCG/INH AEPB INHALE 1 PUFF PO QD 03/14/2016: Received from: External Pharmacy  . clonazePAM (KLONOPIN) 0.5 MG tablet Take 1 tablet (0.5 mg total) by  mouth daily as needed. for anxiety   . Cyanocobalamin (B-12 PO) Take by mouth daily.     Marland Kitchen dicyclomine (BENTYL) 20 MG tablet TAKE 1/2 TABLET BY MOUTH THREE TIMES DAILY BEFORE MEALS   . diltiazem (TIAZAC) 120 MG 24 hr capsule TAKE ONE CAPSULE BY MOUTH DAILY   . dorzolamide (TRUSOPT) 2 % ophthalmic solution Place into both eyes 2 (two) times daily.  03/02/2014: Received from: External Pharmacy  . escitalopram (LEXAPRO) 10 MG tablet Take 2 tablets (20 mg total) by mouth daily.   . fluticasone (FLONASE) 50 MCG/ACT nasal spray Place 2 sprays into both nostrils daily.   . furosemide (LASIX) 40 MG tablet TAKE 1 TABLET(40 MG) BY MOUTH DAILY   . ibuprofen (ADVIL) 800 MG tablet TAKE 1 TABLET(800 MG) BY MOUTH DAILY AS NEEDED FOR MODERATE PAIN   . ipratropium (ATROVENT) 0.06 % nasal spray Place 2 sprays into both nostrils 4 (four) times daily.   Marland Kitchen latanoprost (XALATAN) 0.005 % ophthalmic solution Place 1 drop into both eyes daily.   Marland Kitchen levalbuterol (XOPENEX) 1.25 MG/3ML nebulizer solution Take 1.25 mg by nebulization as needed. 02/27/2015: Takes as needed   . levocetirizine (XYZAL) 5 MG tablet TK 1 T PO QD IN THE EVE   . losartan (COZAAR) 100 MG tablet TAKE 1 TABLET BY MOUTH EVERY DAY   . multivitamin (THERAGRAN) tablet Take 1 tablet by mouth daily.     Marland Kitchen omeprazole (PRILOSEC) 40 MG capsule TAKE 1 CAPSULE BY MOUTH EVERY DAY   . potassium chloride (KLOR-CON) 8 MEQ tablet Take 1 tablet (8 mEq total) by mouth daily.   . sodium chloride (OCEAN) 0.65 % SOLN nasal spray Place 1 spray into both nostrils as needed for congestion.   Marland Kitchen  SUMAtriptan (IMITREX) 100 MG tablet Take 1 tablet (100 mg total) by mouth once. Take 1 tablet at onset of headache   . [DISCONTINUED] diltiazem (DILACOR XR) 120 MG 24 hr capsule Take 1 capsule (120 mg total) by mouth daily.    No facility-administered encounter medications on file as of 11/12/2019.     Current Diagnosis/Assessment:    Goals Addressed   None     Asthma     Last spirometry score: ***  Gold Grade: {CHL HP Upstream Pharm COPD Gold FIEPP:[id number]} Current COPD Classification:  {CHL HP Upstream Pharm COPD [phone]}  Eosinophil count:   Lab Results  Component Value Date/Time   EOSPCT 2.3 10/22/2012 02:44 PM  %                               Eos (Absolute):  Lab Results  Component Value Date/Time   EOSABS 0.1 10/22/2012 02:44 PM    Tobacco Status:  Social History   Tobacco Use  Smoking Status Never Smoker  Smokeless Tobacco Never Used    Patient has failed these meds in past: *** Patient is currently {CHL Controlled/Uncontrolled:2494871594} on the  following medications:  . Albuterol HFA 108 mcg/act 2 puff q6hr PRN  . Breo Ellipta 200-25 mcg/inh 1 puff daily  . Levalbuterol 1.'25mg'$ /72m neb PRN   Using maintenance inhaler regularly? {yes/no:20286} Frequency of rescue inhaler use:  {CHL HP Upstream Pharm Inhaler FAYTK:1601093235} We discussed:  {CHL HP Upstream Pharmacy [phone]}  Plan  Continue {CHL HP Upstream Pharmacy Plans:[phone]}   Hypertension   BP goal is:  {CHL HP UPSTREAM Pharmacist BP ranges:[phone]}  Office blood pressures are  BP Readings from Last 3 Encounters:  11/01/19 136/82  09/21/19 (!) 180/77  05/03/19 (!) 144/88   Patient checks BP at home {CHL HP BP Monitoring [phone]} Patient home BP readings are ranging: ***  Patient has failed these meds in the past: *** Patient is currently {CHL Controlled/Uncontrolled:2494871594} on the following medications:  . Diltiazem 120 mg daily  . Furosemide 40 mg daily . Losartan 100 mg daily   We discussed {CHL HP Upstream Pharmacy [phone]}  Plan  Continue {CHL HP Upstream Pharmacy Plans:[phone]}   Hyperlipidemia   LDL goal < ***  Lipid Panel     Component Value Date/Time   Johnston 175 03/22/2019 1116   TRIG 86.0 03/22/2019 1116   TRIG 55 05/12/2006 0945   HDL 52.70 03/22/2019 1116    LDLCALC 106 (H) 03/22/2019 1116   LDLDIRECT 137.0 10/22/2011 1020    Hepatic Function Latest Ref Rng & Units 03/22/2019 04/01/2018 03/31/2017  Total Protein 6.0 - 8.3 g/dL 7.3 7.5 7.2  Albumin 3.5 - 5.2 g/dL 4.0 4.0 4.0  AST 0 - 37 U/L '17 17 18  '$ ALT 0 - 35 U/L '14 12 11  '$ Alk Phosphatase 39 - 117 U/L 81 88 81  Total Bilirubin 0.2 - 1.2 mg/dL 0.4 0.5 0.6  Bilirubin, Direct 0.0 - 0.3 mg/dL - - -     The 10-year ASCVD risk score (Mikey BussingDC Jr., et al., 2013) is: 13.5%   Values used to calculate the score:     Age: [age]years     Sex: Female     Is Non-Hispanic African American: Yes     Diabetic: No     Tobacco smoker: No     Systolic Blood Pressure: 1573mmHg     Is BP treated: Yes  HDL Cholesterol: 52.7 mg/dL     Total Cholesterol: 175 mg/dL   Patient has failed these meds in past: n/a Patient is currently {CHL Controlled/Uncontrolled:[phone]} on the following medications:  . none  We discussed:  {CHL HP Upstream Pharmacy discussion:[phone]}  Plan  Continue {CHL HP Upstream Pharmacy Plans:[phone]}  Depression / Anxiety   PHQ9 Score:  PHQ9 SCORE ONLY 04/27/2019 04/01/2018 03/31/2017  PHQ-9 Total Score '3 7 2   '$ GAD7 Score: No flowsheet data found.  Patient has failed these meds in past: *** Patient is currently {CHL Controlled/Uncontrolled:[phone]} on the following medications:  . Clonazepam 0.5 mg daily PRN  . Escitalopram 10 mg 2 tabs daily .  We discussed:  ***  Plan  Continue {CHL HP Upstream Pharmacy Plans:[phone]}  Allergic Rhinitis    Patient has failed these meds in past: *** Patient is currently {CHL Controlled/Uncontrolled:[phone]} on the following medications:  . Flonase 50 mcg/act 2 spray daily  . Atrovent 0.06% nasal spray 2 spray QID  . Levocetirizine 5 mg daily  . Sodium chloride nasal spray PRN  .   We discussed:  ***  Plan  Continue {CHL HP Upstream Pharmacy ZTIWP:[id number]}   GERD   Patient has failed these meds in  past: *** Patient is currently {CHL Controlled/Uncontrolled:[phone]} on the following medications:  . Omeprazole 40 mg daily   We discussed:  ***  Plan  Continue {CHL HP Upstream Pharmacy KNLZJ:[id number]}   ***   Patient has failed these meds in past: *** Patient is currently {CHL Controlled/Uncontrolled:[phone]} on the following medications:  Marland Kitchen Vitamin C daily  . Vitamin B12 daily  . Dicyclomine 20 mg 1/2 tab TID  . Dorzolamide 2% ophth 1 drop BID  . Ibuprofen 800 mg daily PRN  . Latanoprost 0.005% ophth 1 drop daily  . Multivitamin daily  . Potassium chloride 8 meQ daily  . Sumatriptan 100 mg PRN  We discussed:  ***  Plan  Continue {CHL HP Upstream Pharmacy WIOXB:[id number]}  Vaccines   Reviewed and discussed patient's vaccination history.    Immunization History  Administered Date(s) Administered  . Fluad Quad(high Dose 65+) 02/16/2019  . Influenza Split 04/05/2011  . Influenza Whole 04/22/2007, 02/23/2008, 02/21/2009, 03/06/2009, 02/28/2010, 01/26/2014  . Influenza, High Dose Seasonal PF 03/06/2017  . Influenza,inj,Quad PF,6+ Mos 01/17/2016  . Influenza-Unspecified 02/21/2015, 02/10/2018  . PFIZER SARS-COV-2 Vaccination 07/28/2019, 08/25/2019  . Pneumococcal Conjugate-13 03/16/2015  . Pneumococcal Polysaccharide-23 05/29/2005, 04/05/2011  . Tdap 05/03/2019  . Zoster 07/26/2009    Plan  Recommended patient receive *** vaccine in *** office.   Medication Management   Pt uses [facility] pharmacy for all medications Uses pill box? {Yes or If no, why not?:20788} Pt endorses ***% compliance  We discussed: ***  Plan  {US Pharmacy STMH:96222}    Follow up: *** month phone visit  ***

## 2019-11-12 ENCOUNTER — Other Ambulatory Visit: Payer: Self-pay

## 2019-11-12 ENCOUNTER — Ambulatory Visit: Payer: PPO | Admitting: Pharmacist

## 2019-11-12 DIAGNOSIS — I10 Essential (primary) hypertension: Secondary | ICD-10-CM

## 2019-11-12 DIAGNOSIS — F411 Generalized anxiety disorder: Secondary | ICD-10-CM

## 2019-11-12 NOTE — Patient Instructions (Addendum)
Visit Information  Phone number for Pharmacist: [phone]  Thank you for meeting with me to discuss your medications! I look forward to working with you to achieve your health care goals. Below is a summary of what we talked about during the visit:  Goals Addressed            This Visit's Progress   . Pharmacy Care Plan       CARE PLAN ENTRY (see longitudinal plan of care for additional care plan information)  Current Barriers:  . Chronic Disease Management support, education, and care coordination needs related to Hypertension and Depression/Anxiety   Hypertension BP Readings from Last 3 Encounters:  11/01/19 136/82  09/21/19 (!) 180/77  05/03/19 (!) 144/88 .  Pharmacist Clinical Goal(s): o Over the next 60 days, patient will work with PharmD and providers to maintain BP goal <140/90 . Current regimen:  o Diltiazem 120 mg daily  o Losartan 100 mg daily  o Furosemide 40 mg  . Interventions: o Discussed BP goals and benefits of medication for prevention of heart attack / stroke o Discussed benefits of taking blood pressure medication at night . Patient self care activities - Over the next 60 days, patient will: o Check BP 1-2 times weekly, document, and provide at future appointments o Ensure daily salt intake < 2300 mg/day  Depression / Anxiety . Pharmacist Clinical Goal(s) o Over the next 60 days, patient will work with PharmD and providers to optimize therapy . Current regimen:  o Clonazepam 0.5 mg daily PRN  o Escitalopram 10 mg 2 tabs daily . Interventions: o Discussed benefits of adding bupropion (Wellbutrin) to target symptoms of low energy and lack of motivation . Patient self care activities - Over the next 60 days, patient will: o Take medications as prescribed  Medication management . Pharmacist Clinical Goal(s): o Over the next 60 days, patient will work with PharmD and providers to maintain optimal medication adherence . Current pharmacy:  [facility] . Interventions o Comprehensive medication review performed. o Continue current medication management strategy . Patient self care activities - Over the next 60 days, patient will: o Focus on medication adherence by patient report o Take medications as prescribed o Report any questions or concerns to PharmD and/or provider(s)  Initial goal documentation       Dominique Johnston was given information about Chronic Care Management services today including:  1. CCM service includes personalized support from designated clinical staff supervised by her physician, including individualized plan of care and coordination with other care providers 2. 24/7 contact phone numbers for assistance for urgent and routine care needs. 3. Standard insurance, coinsurance, copays and deductibles apply for chronic care management only during months in which we provide at least 20 minutes of these services. Most insurances cover these services at 100%, however patients may be responsible for any copay, coinsurance and/or deductible if applicable. This service may help you avoid the need for more expensive face-to-face services. 4. Only one practitioner may furnish and bill the service in a calendar month. 5. The patient may stop CCM services at any time (effective at the end of the month) by phone call to the office staff.  Patient agreed to services and verbal consent obtained.   The patient verbalized understanding of instructions provided today and agreed to receive a mailed copy of patient instruction and/or educational materials. Telephone follow up appointment with pharmacy team member scheduled for: 2 months  Al Corpus, PharmD Clinical Pharmacist [facility]  [place] [phone]  Health Maintenance After Age 24 After age 59, you are at a higher risk for certain long-term diseases and infections as well as injuries from falls. Falls are a major cause of broken bones and head  injuries in people who are older than age 20. Getting regular preventive care can help to keep you healthy and well. Preventive care includes getting regular testing and making lifestyle changes as recommended by your health care provider. Talk with your health care provider about:  Which screenings and tests you should have. A screening is a test that checks for a disease when you have no symptoms.  A diet and exercise plan that is right for you. What should I know about screenings and tests to prevent falls? Screening and testing are the best ways to find a health problem early. Early diagnosis and treatment give you the best chance of managing medical conditions that are common after age 24. Certain conditions and lifestyle choices may make you more likely to have a fall. Your health care provider may recommend:  Regular vision checks. Poor vision and conditions such as cataracts can make you more likely to have a fall. If you wear glasses, make sure to get your prescription updated if your vision changes.  Medicine review. Work with your health care provider to regularly review all of the medicines you are taking, including over-the-counter medicines. Ask your health care provider about any side effects that may make you more likely to have a fall. Tell your health care provider if any medicines that you take make you feel dizzy or sleepy.  Osteoporosis screening. Osteoporosis is a condition that causes the bones to get weaker. This can make the bones weak and cause them to break more easily.  Blood pressure screening. Blood pressure changes and medicines to control blood pressure can make you feel dizzy.  Strength and balance checks. Your health care provider may recommend certain tests to check your strength and balance while standing, walking, or changing positions.  Foot health exam. Foot pain and numbness, as well as not wearing proper footwear, can make you more likely to have a  fall.  Depression screening. You may be more likely to have a fall if you have a fear of falling, feel emotionally low, or feel unable to do activities that you used to do.  Alcohol use screening. Using too much alcohol can affect your balance and may make you more likely to have a fall. What actions can I take to lower my risk of falls? General instructions  Talk with your health care provider about your risks for falling. Tell your health care provider if: ? You fall. Be sure to tell your health care provider about all falls, even ones that seem minor. ? You feel dizzy, sleepy, or off-balance.  Take over-the-counter and prescription medicines only as told by your health care provider. These include any supplements.  Eat a healthy diet and maintain a healthy weight. A healthy diet includes low-fat dairy products, low-fat (lean) meats, and fiber from whole grains, beans, and lots of fruits and vegetables. Home safety  Remove any tripping hazards, such as rugs, cords, and clutter.  Install safety equipment such as grab bars in bathrooms and safety rails on stairs.  Keep rooms and walkways well-lit. Activity   Follow a regular exercise program to stay fit. This will help you maintain your balance. Ask your health care provider what types of exercise are appropriate for you.  If you  need a cane or walker, use it as recommended by your health care provider.  Wear supportive shoes that have nonskid soles. Lifestyle  Do not drink alcohol if your health care provider tells you not to drink.  If you drink alcohol, limit how much you have: ? 0-1 drink a day for women. ? 0-2 drinks a day for men.  Be aware of how much alcohol is in your drink. In the [place] one drink equals one typical bottle of beer (12 oz), one-half glass of wine (5 oz), or one shot of hard liquor (1 oz).  Do not use any products that contain nicotine or tobacco, such as cigarettes and e-cigarettes. If you need help  quitting, ask your health care provider. Summary  Having a healthy lifestyle and getting preventive care can help to protect your health and wellness after age 96.  Screening and testing are the best way to find a health problem early and help you avoid having a fall. Early diagnosis and treatment give you the best chance for managing medical conditions that are more common for people who are older than age 72.  Falls are a major cause of broken bones and head injuries in people who are older than age 42. Take precautions to prevent a fall at home.  Work with your health care provider to learn what changes you can make to improve your health and wellness and to prevent falls. This information is not intended to replace advice given to you by your health care provider. Make sure you discuss any questions you have with your health care provider. Document Revised: 08/20/2018 Document Reviewed: 03/12/2017 Elsevier Patient Education  2020 [employer].

## 2019-11-12 NOTE — Chronic Care Management (AMB) (Signed)
Chronic Care Management Pharmacy  Name: Dominique Johnston  MRN: [id number] DOB: [birth year]   Chief Complaint/ HPI  Dominique Johnston,  74 y.o. , female presents for their Initial CCM visit with the clinical pharmacist via telephone due to COVID-19 Pandemic.  PCP : [name], MD Patient Care Team: [name], MD as PCP - General (Internal Medicine) Sidney Ace, MD (Allergy) Romine, Edwena Felty, MD (Obstetrics and Gynecology) Melina Fiddler, MD as Consulting Physician (Sports Medicine) Antony Contras, MD as Consulting Physician (Ophthalmology) Kathyrn Sheriff, [facility] as Pharmacist (Pharmacist)  Their chronic conditions include: Hypertension, Hyperlipidemia, GERD, Asthma, Anxiety and Osteoarthritis   Was married for 29 years, ex-husband was Company secretary now lives in [place], moved frequently. Lived in [place], [place], [place], [place], [place], [place]. Has 2 children in [place]. From [place], [place]. Graduated from [employer] in [place]. "My life has been a soap opera". Lost her mom last year at 55 which has been very hard as they were very close. Lost her sister at 17 in car accident. 2 brother still living. Father died at 35. She is oldest of her siblings.  Office Visits: 11/01/19: Patient presented to Dr. [name] for follow-up. Benzonatate stopped (patient no longer taking)  Consult Visit: 09/21/19: Patient presented to Urgent Care for acute sinusitis. Patient discharged with Augmentin x 10 days, benzonatate, saline 0.65% nasal spray.  07/28/19: Patient presented to Dr. Maurice Small (physical medicine and rehabilitation). Patient received dexamethasone injection in back.  06/02/19: Patient presented to Dr. Cleophas Dunker (sports medicine) for radiculopathy 05/28/19: Patient presented to Dr. Edinburgh Callas (allergy) for follow-up.   Allergies  Allergen Reactions  . Enalapril Maleate     REACTION: swelling, headache  . Sulfamethoxazole-Trimethoprim     Abdominal  pain, redness eyes    Medications: Outpatient Encounter Medications as of 11/12/2019  Medication Sig Note  . albuterol (PROVENTIL HFA;VENTOLIN HFA) 108 (90 Base) MCG/ACT inhaler Inhale 2 puffs into the lungs every 6 (six) hours as needed.   . Ascorbic Acid (VITAMIN C PO) Take 1 tablet by mouth daily.     Marland Kitchen BREO ELLIPTA 200-25 MCG/INH AEPB INHALE 1 PUFF PO QD 03/14/2016: Received from: External Pharmacy  . clonazePAM (KLONOPIN) 0.5 MG tablet Take 1 tablet (0.5 mg total) by mouth daily as needed. for anxiety   . Cyanocobalamin (B-12 PO) Take by mouth daily.     Marland Kitchen dicyclomine (BENTYL) 20 MG tablet TAKE 1/2 TABLET BY MOUTH THREE TIMES DAILY BEFORE MEALS   . diltiazem (TIAZAC) 120 MG 24 hr capsule TAKE ONE CAPSULE BY MOUTH DAILY   . dorzolamide (TRUSOPT) 2 % ophthalmic solution Place into both eyes 2 (two) times daily.  03/02/2014: Received from: External Pharmacy  . escitalopram (LEXAPRO) 10 MG tablet Take 2 tablets (20 mg total) by mouth daily.   . fluticasone (FLONASE) 50 MCG/ACT nasal spray Place 2 sprays into both nostrils daily.   . furosemide (LASIX) 40 MG tablet TAKE 1 TABLET(40 MG) BY MOUTH DAILY   . gabapentin (NEURONTIN) 100 MG capsule Take 200 mg by mouth at bedtime.   Marland Kitchen ibuprofen (ADVIL) 800 MG tablet TAKE 1 TABLET(800 MG) BY MOUTH DAILY AS NEEDED FOR MODERATE PAIN   . ipratropium (ATROVENT) 0.06 % nasal spray Place 2 sprays into both nostrils 4 (four) times daily.   Marland Kitchen latanoprost (XALATAN) 0.005 % ophthalmic solution Place 1 drop into both eyes daily.   Marland Kitchen levalbuterol (XOPENEX) 1.25 MG/3ML nebulizer solution Take 1.25 mg by nebulization as needed.  02/27/2015: Takes as needed   . levocetirizine (XYZAL) 5 MG tablet TK 1 T PO QD IN THE EVE   . losartan (COZAAR) 100 MG tablet TAKE 1 TABLET BY MOUTH EVERY DAY   . multivitamin (THERAGRAN) tablet Take 1 tablet by mouth daily.     Marland Kitchen omeprazole (PRILOSEC) 40 MG capsule TAKE 1 CAPSULE BY MOUTH EVERY DAY   . potassium chloride (KLOR-CON) 8 MEQ  tablet Take 1 tablet (8 mEq total) by mouth daily.   . sodium chloride (OCEAN) 0.65 % SOLN nasal spray Place 1 spray into both nostrils as needed for congestion.   . SUMAtriptan (IMITREX) 100 MG tablet Take 1 tablet (100 mg total) by mouth once. Take 1 tablet at onset of headache   . [DISCONTINUED] diltiazem (DILACOR XR) 120 MG 24 hr capsule Take 1 capsule (120 mg total) by mouth daily.    No facility-administered encounter medications on file as of 11/12/2019.    Current Diagnosis/Assessment:  SDOH Interventions     Most Recent Value  SDOH Interventions  Financial Strain Interventions Intervention Not Indicated      Goals Addressed            This Visit's Progress   . Pharmacy Care Plan       CARE PLAN ENTRY (see longitudinal plan of care for additional care plan information)  Current Barriers:  . Chronic Disease Management support, education, and care coordination needs related to Hypertension, Asthma, and Depression   Hypertension BP Readings from Last 3 Encounters:  11/01/19 136/82  09/21/19 (!) 180/77  05/03/19 (!) 144/88 .  Pharmacist Clinical Goal(s): o Over the next 60 days, patient will work with PharmD and providers to maintain BP goal <140/90 . Current regimen:  o Diltiazem 120 mg daily  o Losartan 100 mg daily  o Furosemide 40 mg  . Interventions: o Discussed BP goals and benefits of medication for prevention of heart attack / stroke o Discussed benefits of taking blood pressure medication at night . Patient self care activities - Over the next 60 days, patient will: o Check BP 1-2 times weekly, document, and provide at future appointments o Ensure daily salt intake < 2300 mg/day  Depression / Anxiety . Pharmacist Clinical Goal(s) o Over the next 60 days, patient will work with PharmD and providers to optimize therapy . Current regimen:  o Clonazepam 0.5 mg daily PRN  o Escitalopram 10 mg 2 tabs daily . Interventions: o Discussed benefits of adding  bupropion (Wellbutrin) to target symptoms of low energy and lack of motivation . Patient self care activities - Over the next 60 days, patient will: o Take medications as prescribed  Medication management . Pharmacist Clinical Goal(s): o Over the next 60 days, patient will work with PharmD and providers to maintain optimal medication adherence . Current pharmacy: [facility] . Interventions o Comprehensive medication review performed. o Continue current medication management strategy . Patient self care activities - Over the next 60 days, patient will: o Focus on medication adherence by patient report o Take medications as prescribed o Report any questions or concerns to PharmD and/or provider(s)  Initial goal documentation       Asthma    Last spirometry score: n/a  Tobacco Status:  Social History   Tobacco Use  Smoking Status Never Smoker  Smokeless Tobacco Never Used   Patient has failed these meds in past: n/a Patient is currently controlled on the  following medications:  . Albuterol HFA 108 mcg/act 2 puff q6hr  PRN  . Virgel BouquetBreo Ellipta 200-25 mcg/inh 1 puff daily  . Levalbuterol 1.25mg /253mL neb PRN   Using maintenance inhaler regularly? Yes Frequency of rescue inhaler use:  infrequently  We discussed:  Pt reports symptoms are generally well controlled; she does have worse SOB/coughing during spring/pollen; also reports Virgel BouquetBreo is generally affordable, price is high ~last 3 months of the year during donut hole but this has been manageable in the past.  Plan  Continue current medications   Hypertension   BP goal is:  <140/90  Office blood pressures are  BP Readings from Last 3 Encounters:  11/01/19 136/82  09/21/19 (!) 180/77  05/03/19 (!) 144/88   Kidney Function Lab Results  Component Value Date/Time   CREATININE 1.01 03/22/2019 11:16 AM   CREATININE 1.02 04/01/2018 11:06 AM   GFR 64.97 03/22/2019 11:16 AM   GFRNONAA 64.40 07/19/2009 09:42 AM   GFRAA 65  07/18/2008 12:27 PM   K 3.8 03/22/2019 11:16 AM   K 3.9 04/01/2018 11:06 AM   Patient checks BP at home several times per month Patient home BP readings are ranging: 129/72 - 134/74  Patient has failed these meds in the past: n/a Patient is currently controlled on the following medications:  . Diltiazem 120 mg daily PM . Losartan 100 mg daily PM  Furosemide 40 mg daily  We discussed BP goals, optimal timing of BP medications at bedtime for most effective control; does not take furosemide every single day if she is feeling dehydrated.   Plan  Continue current medications   Depression / Anxiety   [facility] 04/27/2019 04/01/2018 03/31/2017  Decreased Interest 1 2 0  Down, Depressed, Hopeless 1 2 2   PHQ - 2 Score 2 4 2   Altered sleeping 1 0 0  Tired, decreased energy 0 2 0  Change in appetite 0 0 0  Feeling bad or failure about yourself  0 1 0  Trouble concentrating 0 0 0  Moving slowly or fidgety/restless 0 0 0  Suicidal thoughts 0 0 0  PHQ-9 Score 3 7 2   Difficult doing work/chores Somewhat difficult Not difficult at all Not difficult at all   GAD7 Score: No flowsheet data found.  Patient has failed these meds in past: sertraline, venlafaxine Patient is currently controlled on the following medications:  . Clonazepam 0.5 mg daily PRN  . Escitalopram 10 mg 2 tabs daily  We discussed:  Pt uses clonazepam sparingly; compliant with lexapro; pt reports she thinks this dose is working Child psychotherapistdecently well for her, but she does not have a lot of energy, there are days when her "will" is not where it should be, she endorses low motivation or "gumption". She reports feeling this way more days than not. May consider adding bupropion to help with energy/motivation.    Plan  Continue current medications  Recommend adding bupropion 150 mg XL  Allergic Rhinitis    Used to get allergy shots, has been off for many yeas.  Patient has failed these meds in past:  n/a Patient is currently controlled on the following medications:  . Flonase 50 mcg/act 2 spray daily PRN . Atrovent 0.06% nasal spray 2 spray PRN . Levocetirizine 5 mg daily HS . Saline nasal spray PRN   We discussed:  Pt has long history of allergies, currently controlled on current regimen per pt.  Plan  Continue current medications   GERD / GI   Patient has failed these meds in past: n/a Patient is currently controlled on  the following medications:  . Omeprazole 40 mg daily   Dicyclomine 20 mg 1 tablet daily  We discussed:  PPI has made major difference per pt, denies reflux issues as long as she takes it. Pt avoids spicy foods. Pt reports constipation associated with stress/anxiety. Pt reports dicyclomine   Plan  Continue current medications  Glaucoma   Patient has failed these meds in past: n/a Patient is currently controlled on the following medications:   Dorzolamide 2% ophth 1 drop BID   Latanoprost 0.005% ophth 1 drop daily   We discussed:  Pt endorses compliance and denies issues  Plan  Continue current medications  Arthritis   Knee injections at sports med.  Patient has failed these meds in past: n/a Patient is currently controlled on the following medications:   Ibuprofen 200 mg daily PRN  Gabapentin 100 mg - 2 tab HS (Dr Cleophas Dunker)  Tylenol Extra Strength 500 mg PRN  We discussed:  Pt has had to cut back on activity due to arthritis pain. Injections and gabapentin has helped, but she does have "spasms" occasionally. She limits ibuprofen because she knows it can cause BP issues.  Plan  Continue current medications  Health maintenance   Patient is currently controlled on the following medications:  Marland Kitchen Vitamin C daily  . Vitamin B12 daily  . Multivitamin daily  . Potassium chloride 8 meQ daily    We discussed:  Patient is satisfied with current OTC regimen and denies issues  Plan  Continue current medications   Medication Management    Pt uses [facility] for all medications Uses pill box? No - prefers bottles Pt endorses 100% compliance  We discussed: Pt used to use [facility] but insurance had switched to [facility], some rx's on auto refill. Discussed benefits of med sync, packaging and delivery with [facility], pt will think about it and let me know if she would like to switch.  Plan  Continue current medication management strategy    Follow up: 2 month phone visit  Al Corpus, PharmD Clinical Pharmacist [facility] [phone]

## 2019-11-16 NOTE — Addendum Note (Signed)
Addended by: Radford Pax M on: 11/16/2019 03:16 PM   Modules accepted: Orders

## 2019-11-24 ENCOUNTER — Ambulatory Visit (INDEPENDENT_AMBULATORY_CARE_PROVIDER_SITE_OTHER): Payer: PPO | Admitting: Internal Medicine

## 2019-11-24 ENCOUNTER — Other Ambulatory Visit: Payer: Self-pay

## 2019-11-24 ENCOUNTER — Encounter: Payer: Self-pay | Admitting: Internal Medicine

## 2019-11-24 DIAGNOSIS — F411 Generalized anxiety disorder: Secondary | ICD-10-CM

## 2019-11-24 MED ORDER — BUPROPION HCL ER (XL) 150 MG PO TB24: 150.0000 mg | ORAL_TABLET | Freq: Every day | ORAL | 3 refills | Status: DC

## 2019-11-24 MED ORDER — CLONAZEPAM 0.5 MG PO TABS: 0.5000 mg | ORAL_TABLET | Freq: Every day | ORAL | 3 refills | Status: DC | PRN

## 2019-11-24 NOTE — Progress Notes (Signed)
   Subjective:   Patient ID: Dominique Johnston, female    DOB: 1945/06/29, 74 y.o.   MRN: [id number]  HPI The patient is a 74 YO female coming in for concerns about low energy. Had talked to our pharmacist and they suggested adding wellbutrin to her lexapro dosing to help more with energy. She has been struggling with anxiety and motivation to do much of anything. She has seen her brothers recently and enjoyed that. She was tired afterwards and sometimes needs motivation to get out of bed. Taking lexapro 20 mg daily and this does help. If she misses or takes late she can notice difference. Denies SI/HI. Is still dealing with loss of mother and father in the last several years and anniversary coming up on loss of mother which is very difficult.   Review of Systems  Constitutional: Negative.   HENT: Negative.   Eyes: Negative.   Respiratory: Negative for cough, chest tightness and shortness of breath.   Cardiovascular: Negative for chest pain, palpitations and leg swelling.  Gastrointestinal: Negative for abdominal distention, abdominal pain, constipation, diarrhea, nausea and vomiting.  Musculoskeletal: Negative.   Skin: Negative.   Neurological: Negative.   Psychiatric/Behavioral: Positive for dysphoric mood and sleep disturbance. Negative for self-injury and suicidal ideas. The patient is nervous/anxious.     Objective:  Physical Exam Constitutional:      Appearance: She is well-developed.  HENT:     Head: Normocephalic and atraumatic.  Cardiovascular:     Rate and Rhythm: Normal rate and regular rhythm.  Pulmonary:     Effort: Pulmonary effort is normal. No respiratory distress.     Breath sounds: Normal breath sounds. No wheezing or rales.  Abdominal:     General: Bowel sounds are normal. There is no distension.     Palpations: Abdomen is soft.     Tenderness: There is no abdominal tenderness. There is no rebound.  Musculoskeletal:     Cervical back: Normal range of  motion.  Skin:    General: Skin is warm and dry.  Neurological:     Mental Status: She is alert and oriented to person, place, and time.     Coordination: Coordination normal.  Psychiatric:     Comments: Appropriately tearful at times     Vitals:   11/24/19 1004  BP: (!) 158/92  Pulse: 76  Temp: 98.2 F (36.8 C)  TempSrc: Oral  SpO2: 96%  Weight: 205 lb (93 kg)  Height: 5\' 6"  (1.676 m)    This visit occurred during the SARS-CoV-2 public health emergency.  Safety protocols were in place, including screening questions prior to the visit, additional usage of staff PPE, and extensive cleaning of exam room while observing appropriate contact time as indicated for disinfecting solutions.   Assessment & Plan:  Visit time 25 minutes in face to face communication with patient and coordination of care, additional 5 minutes spent in record review, coordination or care, ordering tests, communicating/referring to other healthcare professionals, documenting in medical records all on the same day of the visit for total time 30 minutes spent on the visit.

## 2019-11-24 NOTE — Patient Instructions (Signed)
We have sent in the wellbutrin (bupropion) to take 1 pill daily.   This should show some benefit in 2-3 weeks. Let us know how this is doing.

## 2019-11-25 NOTE — Assessment & Plan Note (Signed)
Will add wellbutrin 150 mg daily to her lexapro 20 mg daily to see if this can help temporarily. We talked about counseling to help her as well with sorting through some of this major life events.

## 2019-12-16 ENCOUNTER — Other Ambulatory Visit: Payer: Self-pay | Admitting: Internal Medicine

## 2020-01-05 ENCOUNTER — Other Ambulatory Visit: Payer: Self-pay

## 2020-01-05 ENCOUNTER — Ambulatory Visit: Payer: PPO | Admitting: Pharmacist

## 2020-01-05 DIAGNOSIS — I10 Essential (primary) hypertension: Secondary | ICD-10-CM

## 2020-01-05 DIAGNOSIS — F411 Generalized anxiety disorder: Secondary | ICD-10-CM

## 2020-01-05 NOTE — Chronic Care Management (AMB) (Signed)
Chronic Care Management Pharmacy  Name: Dominique Johnston  MRN: [id number] DOB: 1946-04-20   Chief Complaint/ HPI  Dominique Johnston,  74 y.o. , female presents for their Follow-Up CCM visit with the clinical pharmacist via telephone due to COVID-19 Pandemic.  PCP : Myrlene Brokerrawford, Elizabeth A, MD Patient Care Team: Myrlene Brokerrawford, Elizabeth A, MD as PCP - General (Internal Medicine) Sidney AceSharma, Ranjan, MD (Allergy) Romine, Edwena Feltyynthia P, MD (Obstetrics and Gynecology) Melina FiddlerBassett, Rebecca S, MD as Consulting Physician (Sports Medicine) Antony ContrasLyles, Graham, MD as Consulting Physician (Ophthalmology) Kathyrn SheriffFoltanski, Neshawn Aird N, [facility] as Pharmacist (Pharmacist)  Their chronic conditions include: Hypertension, Hyperlipidemia, GERD, Asthma, Anxiety and Osteoarthritis   Pt was married for 29 years, ex-husband was [employer] now lives in [place], moved frequently. Lived in [place], [place], [place], [place], [place], [place]. Has 2 children in [place]. From [place], [place]. Graduated from [employer] in [place]. "My life has been a soap opera". Lost her mom last year at [age] which has been very hard as they were very close. Lost her sister at 9625 in car accident. 2 brothers still living. Father died at 1373. She is oldest of her siblings.   Office Visits: 11/24/19 Dr Okey Duprerawford OV: added bupropion XL 150 mg. Also discussed counseling.  11/01/19: Patient presented to Dr. Okey Duprerawford for follow-up. Benzonatate stopped (patient no longer taking)  Consult Visit: 09/21/19: Patient presented to Urgent Care for acute sinusitis. Patient discharged with Augmentin x 10 days, benzonatate, saline 0.65% nasal spray.  07/28/19: Patient presented to Dr. Maurice SmallIbazebo (physical medicine and rehabilitation). Patient received dexamethasone injection in back.  06/02/19: Patient presented to Dr. Cleophas DunkerBassett (sports medicine) for radiculopathy 05/28/19: Patient presented to Dr. Lynn CallasSharma (allergy) for follow-up.   Allergies  Allergen Reactions  . Enalapril  Maleate     REACTION: swelling, headache  . Sulfamethoxazole-Trimethoprim     Abdominal pain, redness eyes    Medications: Outpatient Encounter Medications as of 01/05/2020  Medication Sig Note  . albuterol (PROVENTIL HFA;VENTOLIN HFA) 108 (90 Base) MCG/ACT inhaler Inhale 2 puffs into the lungs every 6 (six) hours as needed.   . Ascorbic Acid (VITAMIN C PO) Take 1 tablet by mouth daily.     Marland Kitchen. BREO ELLIPTA 200-25 MCG/INH AEPB INHALE 1 PUFF PO QD 03/14/2016: Received from: External Pharmacy  . buPROPion (WELLBUTRIN XL) 150 MG 24 hr tablet Take 1 tablet (150 mg total) by mouth daily.   . clonazePAM (KLONOPIN) 0.5 MG tablet Take 1 tablet (0.5 mg total) by mouth daily as needed. for anxiety   . Cyanocobalamin (B-12 PO) Take by mouth daily.     Marland Kitchen. dicyclomine (BENTYL) 20 MG tablet TAKE 1/2 TABLET BY MOUTH THREE TIMES DAILY BEFORE MEALS   . diltiazem (TIAZAC) 120 MG 24 hr capsule TAKE ONE CAPSULE BY MOUTH DAILY   . dorzolamide (TRUSOPT) 2 % ophthalmic solution Place into both eyes 2 (two) times daily.  03/02/2014: Received from: External Pharmacy  . escitalopram (LEXAPRO) 10 MG tablet TAKE 2 TABLETS(20 MG) BY MOUTH DAILY   . fluticasone (FLONASE) 50 MCG/ACT nasal spray Place 2 sprays into both nostrils daily.   . furosemide (LASIX) 40 MG tablet TAKE 1 TABLET(40 MG) BY MOUTH DAILY   . gabapentin (NEURONTIN) 100 MG capsule Take 200 mg by mouth at bedtime.   Marland Kitchen. ibuprofen (ADVIL) 800 MG tablet TAKE 1 TABLET(800 MG) BY MOUTH DAILY AS NEEDED FOR MODERATE PAIN   . ipratropium (ATROVENT) 0.06 % nasal spray Place 2 sprays into both nostrils 4 (four) times  daily.   . latanoprost (XALATAN) 0.005 % ophthalmic solution Place 1 drop into both eyes daily.   Marland Kitchen levalbuterol (XOPENEX) 1.25 MG/3ML nebulizer solution Take 1.25 mg by nebulization as needed. 02/27/2015: Takes as needed   . levocetirizine (XYZAL) 5 MG tablet TK 1 T PO QD IN THE EVE   . losartan (COZAAR) 100 MG tablet TAKE 1 TABLET BY MOUTH EVERY DAY   .  multivitamin (THERAGRAN) tablet Take 1 tablet by mouth daily.     Marland Kitchen omeprazole (PRILOSEC) 40 MG capsule TAKE 1 CAPSULE BY MOUTH EVERY DAY   . potassium chloride (KLOR-CON) 8 MEQ tablet Take 1 tablet (8 mEq total) by mouth daily.   . sodium chloride (OCEAN) 0.65 % SOLN nasal spray Place 1 spray into both nostrils as needed for congestion.   . SUMAtriptan (IMITREX) 100 MG tablet Take 1 tablet (100 mg total) by mouth once. Take 1 tablet at onset of headache   . [DISCONTINUED] diltiazem (DILACOR XR) 120 MG 24 hr capsule Take 1 capsule (120 mg total) by mouth daily.    No facility-administered encounter medications on file as of 01/05/2020.    Current Diagnosis/Assessment:    Goals Addressed            This Visit's Progress   . Pharmacy Care Plan       CARE PLAN ENTRY (see longitudinal plan of care for additional care plan information)  Current Barriers:  . Chronic Disease Management support, education, and care coordination needs related to Hypertension, Depression, and Anxiety   Hypertension BP Readings from Last 3 Encounters:  11/01/19 136/82  09/21/19 (!) 180/77  05/03/19 (!) 144/88 .  Pharmacist Clinical Goal(s): o Over the next 30 days, patient will work with PharmD and providers to maintain BP goal <140/90 . Current regimen:  o Diltiazem 120 mg daily  o Losartan 100 mg daily  o Furosemide 40 mg  . Interventions: o Discussed BP goals and benefits of medication for prevention of heart attack / stroke o Discussed benefits of taking blood pressure medication at night . Patient self care activities - Over the next 30 days, patient will: o Check BP 1-2 times weekly, document, and provide at future appointments o Ensure daily salt intake < 2300 mg/day  Depression / Anxiety . Pharmacist Clinical Goal(s) o Over the next 30 days, patient will work with PharmD and providers to optimize therapy . Current regimen:  o Clonazepam 0.5 mg daily PRN  o Escitalopram 10 mg 2 tabs  daily o Bupropion XL 150 mg daily . Interventions: o Discussed benefits of adding bupropion (Wellbutrin) to target symptoms of low energy and lack of motivation . Patient self care activities - Over the next 30 days, patient will: o Take medications as prescribed  Medication management . Pharmacist Clinical Goal(s): o Over the next 30 days, patient will work with PharmD and providers to maintain optimal medication adherence . Current pharmacy: [facility] . Interventions o Comprehensive medication review performed. o Continue current medication management strategy . Patient self care activities - Over the next 30 days, patient will: o Focus on medication adherence by patient report o Take medications as prescribed o Report any questions or concerns to PharmD and/or provider(s)  Please see past updates related to this goal by clicking on the "Past Updates" button in the selected goal        Asthma    Last spirometry score: n/a  Tobacco Status:  Social History   Tobacco Use  Smoking Status Never  Smoker  Smokeless Tobacco Never Used   Patient has failed these meds in past: n/a Patient is currently controlled on the  following medications:  . Albuterol HFA 108 mcg/act 2 puff q6hr PRN  . Breo Ellipta 200-25 mcg/inh 1 puff daily  . Levalbuterol 1.25mg /37mL neb PRN   Using maintenance inhaler regularly? Yes Frequency of rescue inhaler use:  infrequently  We discussed:  Pt reports symptoms are generally well controlled; she does have worse SOB/coughing during spring/pollen; also reports Virgel Bouquet is generally affordable, price is high ~last 3 months of the year during donut hole but this has been manageable in the past.  Plan  Continue current medications   Hypertension   BP goal is:  <140/90  Office blood pressures are  BP Readings from Last 3 Encounters:  11/24/19 (!) 158/92  11/01/19 136/82  09/21/19 (!) 180/77   Kidney Function Lab Results  Component Value Date/Time    CREATININE 1.01 03/22/2019 11:16 AM   CREATININE 1.02 04/01/2018 11:06 AM   GFR 64.97 03/22/2019 11:16 AM   GFRNONAA 64.40 07/19/2009 09:42 AM   GFRAA 65 07/18/2008 12:27 PM   K 3.8 03/22/2019 11:16 AM   K 3.9 04/01/2018 11:06 AM   Patient checks BP at home several times per month Patient home BP readings are ranging: 129/72 - 134/74  Patient has failed these meds in the past: n/a Patient is currently controlled on the following medications:  . Diltiazem 120 mg daily PM . Losartan 100 mg daily PM  Furosemide 40 mg daily  We discussed BP goals, optimal timing of BP medications at bedtime for most effective control; does not take furosemide every single day if she is feeling dehydrated.   Plan  Continue current medications   Depression / Anxiety   Depression screen [facility] 2/9 04/27/2019 04/01/2018 03/31/2017  Decreased Interest 1 2 0  Down, Depressed, Hopeless 1 2 2   PHQ - 2 Score 2 4 2   Altered sleeping 1 0 0  Tired, decreased energy 0 2 0  Change in appetite 0 0 0  Feeling bad or failure about yourself  0 1 0  Trouble concentrating 0 0 0  Moving slowly or fidgety/restless 0 0 0  Suicidal thoughts 0 0 0  PHQ-9 Score 3 7 2   Difficult doing work/chores Somewhat difficult Not difficult at all Not difficult at all   GAD7 Score: No flowsheet data found.  Patient has failed these meds in past: sertraline, venlafaxine Patient is currently controlled on the following medications:  . Clonazepam 0.5 mg daily PRN  . Escitalopram 10 mg 2 tabs daily . Bupropion XL 150 mg daily  We discussed: Pt started bupropion a little over a month ago; she thinks it may be helping some, but August has been a particularly difficult month for her - it is the anniversary of her mother's death, and she also lost her aunt earlier this month. Discussed benefits of counseling as well.   Plan  Continue current medications    Medication Management   Pt uses [facility] for all  medications Uses pill box? No - prefers bottles Pt endorses 100% compliance  We discussed: Pt used to [facility] but insurance had switched to [facility], some rx's on auto refill. Discussed benefits of med sync, packaging and delivery with [facility], pt will think about it and let me know if she would like to switch.  Plan  Continue current medication management strategy    Follow up: 4 month phone visit   Lenice Llamas, PharmD Clinical Pharmacist [facility] [phone]

## 2020-01-05 NOTE — Patient Instructions (Addendum)
Visit Information  Phone number for Pharmacist: [phone]  Goals Addressed            This Visit's Progress   . Pharmacy Care Plan       CARE PLAN ENTRY (see longitudinal plan of care for additional care plan information)  Current Barriers:  . Chronic Disease Management support, education, and care coordination needs related to Hypertension, Depression, and Anxiety   Hypertension BP Readings from Last 3 Encounters:  11/01/19 136/82  09/21/19 (!) 180/77  05/03/19 (!) 144/88 .  Pharmacist Clinical Goal(s): o Over the next 30 days, patient will work with PharmD and providers to maintain BP goal <140/90 . Current regimen:  o Diltiazem 120 mg daily  o Losartan 100 mg daily  o Furosemide 40 mg  . Interventions: o Discussed BP goals and benefits of medication for prevention of heart attack / stroke o Discussed benefits of taking blood pressure medication at night . Patient self care activities - Over the next 30 days, patient will: o Check BP 1-2 times weekly, document, and provide at future appointments o Ensure daily salt intake < 2300 mg/day  Depression / Anxiety . Pharmacist Clinical Goal(s) o Over the next 30 days, patient will work with PharmD and providers to optimize therapy . Current regimen:  o Clonazepam 0.5 mg daily PRN  o Escitalopram 10 mg 2 tabs daily o Bupropion XL 150 mg daily . Interventions: o Discussed benefits of adding bupropion (Wellbutrin) to target symptoms of low energy and lack of motivation . Patient self care activities - Over the next 30 days, patient will: o Take medications as prescribed  Medication management . Pharmacist Clinical Goal(s): o Over the next 30 days, patient will work with PharmD and providers to maintain optimal medication adherence . Current pharmacy: [facility] . Interventions o Comprehensive medication review performed. o Continue current medication management strategy . Patient self care activities - Over the next 30  days, patient will: o Focus on medication adherence by patient report o Take medications as prescribed o Report any questions or concerns to PharmD and/or provider(s)  Please see past updates related to this goal by clicking on the "Past Updates" button in the selected goal       Patient verbalizes understanding of instructions provided today.  Telephone follow up appointment with pharmacy team member scheduled for: 4 months  Al Corpus, PharmD, BCACP Clinical Pharmacist [facility] [phone]  Managing Loss, Adult People experience loss in many different ways throughout their lives. Events such as moving, changing jobs, and losing friends can create a sense of loss. The loss may be as serious as a major health change, divorce, death of a pet, or death of a loved one. All of these types of loss are likely to create a physical and emotional reaction known as grief. Grief is the result of a major change or an absence of something or someone that you count on. Grief is a normal reaction to loss. A variety of factors can affect your grieving experience, including:  The nature of your loss.  Your relationship to what or whom you lost.  Your understanding of grief and how to manage it.  Your support system. How to manage lifestyle changes Keep to your normal routine as much as possible.  If you have trouble focusing or doing normal activities, it is acceptable to take some time away from your normal routine.  Spend time with friends and loved ones.  Eat a healthy  diet, get plenty of sleep, and rest when you feel tired. How to recognize changes  The way that you deal with your grief will affect your ability to function as you normally do. When grieving, you may experience these changes:  Numbness, shock, sadness, anxiety, anger, denial, and guilt.  Thoughts about death.  Unexpected crying.  A physical sensation of emptiness in your  stomach.  Problems sleeping and eating.  Tiredness (fatigue).  Loss of interest in normal activities.  Dreaming about or imagining seeing the person who died.  A need to remember what or whom you lost.  Difficulty thinking about anything other than your loss for a period of time.  Relief. If you have been expecting the loss for a while, you may feel a sense of relief when it happens. Follow these instructions at home:  Activity Express your feelings in healthy ways, such as:  Talking with others about your loss. It may be helpful to find others who have had a similar loss, such as a support group.  Writing down your feelings in a journal.  Doing physical activities to release stress and emotional energy.  Doing creative activities like painting, sculpting, or playing or listening to music.  Practicing resilience. This is the ability to recover and adjust after facing challenges. Reading some resources that encourage resilience may help you to learn ways to practice those behaviors. General instructions  Be patient with yourself and others. Allow the grieving process to happen, and remember that grieving takes time. ? It is likely that you may never feel completely done with some grief. You may find a way to move on while still cherishing memories and feelings about your loss. ? Accepting your loss is a process. It can take months or longer to adjust.  Keep all follow-up visits as told by your health care provider. This is important. Where to find support To get support for managing loss:  Ask your health care provider for help and recommendations, such as grief counseling or therapy.  Think about joining a support group for people who are managing a loss. Where to find more information You can find more information about managing loss from:  American Society of Clinical Oncology: www.cancer.net  American Psychological Association: [employer] Contact a health care  provider if:  Your grief is extreme and keeps getting worse.  You have ongoing grief that does not improve.  Your body shows symptoms of grief, such as illness.  You feel depressed, anxious, or lonely. Get help right away if:  You have thoughts about hurting yourself or others. If you ever feel like you may hurt yourself or others, or have thoughts about taking your own life, get help right away. You can go to your nearest emergency department or call:  Your local emergency services (911 in the [place]  A suicide crisis helpline, such as the National Suicide Prevention Lifeline at (432) 609-1733. This is open 24 hours a day. Summary  Grief is the result of a major change or an absence of someone or something that you count on. Grief is a normal reaction to loss.  The depth of grief and the period of recovery depend on the type of loss and your ability to adjust to the change and process your feelings.  Processing grief requires patience and a willingness to accept your feelings and talk about your loss with people who are supportive.  It is important to find resources that work for you and to realize that people  experience grief differently. There is not one grieving process that works for everyone in the same way.  Be aware that when grief becomes extreme, it can lead to more severe issues like isolation, depression, anxiety, or suicidal thoughts. Talk with your health care provider if you have any of these issues. This information is not intended to replace advice given to you by your health care provider. Make sure you discuss any questions you have with your health care provider. Document Revised: 07/03/2018 Document Reviewed: 09/12/2016 Elsevier Patient Education  [place].

## 2020-01-12 ENCOUNTER — Other Ambulatory Visit: Payer: Self-pay | Admitting: Internal Medicine

## 2020-01-16 ENCOUNTER — Other Ambulatory Visit: Payer: Self-pay | Admitting: Internal Medicine

## 2020-02-26 ENCOUNTER — Other Ambulatory Visit: Payer: Self-pay

## 2020-02-26 ENCOUNTER — Encounter (HOSPITAL_COMMUNITY): Payer: Self-pay | Admitting: Emergency Medicine

## 2020-02-26 ENCOUNTER — Ambulatory Visit (HOSPITAL_COMMUNITY)
Admission: EM | Admit: 2020-02-26 | Discharge: 2020-02-26 | Disposition: A | Discharge status: Home / Self Care | Payer: PPO | Attending: Emergency Medicine | Admitting: Emergency Medicine

## 2020-02-26 ENCOUNTER — Ambulatory Visit (INDEPENDENT_AMBULATORY_CARE_PROVIDER_SITE_OTHER): Payer: PPO

## 2020-02-26 DIAGNOSIS — R0789 Other chest pain: Secondary | ICD-10-CM

## 2020-02-26 DIAGNOSIS — M25552 Pain in left hip: Secondary | ICD-10-CM | POA: Diagnosis not present

## 2020-02-26 DIAGNOSIS — W19XXXA Unspecified fall, initial encounter: Secondary | ICD-10-CM

## 2020-02-26 MED ORDER — DICLOFENAC SODIUM 1 % EX GEL: 4.0000 g | Freq: Four times a day (QID) | CUTANEOUS | 0 refills | Status: AC

## 2020-02-26 NOTE — ED Triage Notes (Signed)
Pt presents with left side pain after tripping on back deck and fell xs 4 days.

## 2020-02-26 NOTE — Discharge Instructions (Addendum)
Your xray does not show any indication of any breaks or fractures.  Continue to follow with your orthopedist as needed for persistent symptoms.  Tylenol as needed for pain.  Voltaren gel topically to affected areas may be helpful as well.

## 2020-02-26 NOTE — ED Provider Notes (Signed)
[facility]    CSN: [id number] Arrival date & time: 02/26/20  1251      History   Chief Complaint Chief Complaint  Patient presents with  . Fall  . Flank Pain    HPI Dominique Johnston is a 74 y.o. female.   Dominique Johnston presents with complaints of left hip pain as well as mild right lateral chest wall pain, and abrasion to left knee, s/p fall onto her deck 10/12. She tripped. No head injury. Ambulatory since. History of DDD and left hip pain at baseline, has been getting injections to the low back for this. She is about due for injection. Has been taking tylenol and intermittent ibuprofen which have minimally helped with pain. No new numbness or tingling. No shortness of breath . No pain with breathing. States she seeks evaluation to make sure she has not broken anything.    ROS per HPI, negative if not otherwise mentioned.      Past Medical History:  Diagnosis Date  . Allergy    rhinitis  . Asthma   . Bouchard nodes (DJD hand)   . Bronchitis    recurrent  . Cataract   . Depression   . DJD (degenerative joint disease) of knee   . DJD (degenerative joint disease), lumbosacral   . GERD (gastroesophageal reflux disease)   . Headache(784.0)   . Headache(784.0)   . Hyperlipidemia   . Hypertension   . IBS (irritable bowel syndrome)   . Sinusitis, chronic     Patient Active Problem List   Diagnosis Date Noted  . Left hip pain 03/22/2019  . Mild intermittent asthma 10/08/2016  . Routine health maintenance 10/25/2012  . Borderline high cholesterol 01/19/2010  . G E REFLUX 12/10/2007  . Generalized anxiety disorder 12/04/2007  . Essential hypertension 12/01/2006  . Allergic rhinitis 12/01/2006  . IRRITABLE BOWEL SYNDROME 12/01/2006  . Osteoarthritis 12/01/2006    Past Surgical History:  Procedure Laterality Date  . BREAST EXCISIONAL BIOPSY Left 1998  . CATARACT EXTRACTION Right    04/21/18  . ORIF ANKLE FRACTURE  '07   Left  ankle  . SEPTOPLASTY  2000  . TONSILLECTOMY    . transnasal laryngoscopy  2005  . TUBAL LIGATION  1977    OB History   No obstetric history on file.      Home Medications    Prior to Admission medications   Medication Sig Start Date End Date Taking? Authorizing Provider  albuterol (PROVENTIL HFA;VENTOLIN HFA) 108 (90 Base) MCG/ACT inhaler Inhale 2 puffs into the lungs every 6 (six) hours as needed. 03/28/16   [name], MD  Ascorbic Acid (VITAMIN C PO) Take 1 tablet by mouth daily.      [provider]  Earlie Server 132-44 MCG/INH AEPB INHALE 1 PUFF PO QD 02/27/16   [provider]  buPROPion (WELLBUTRIN XL) 150 MG 24 hr tablet Take 1 tablet (150 mg total) by mouth daily. 11/24/19   [name], MD  clonazePAM (KLONOPIN) 0.5 MG tablet Take 1 tablet (0.5 mg total) by mouth daily as needed. for anxiety 11/24/19   [name], MD  Cyanocobalamin (B-12 PO) Take by mouth daily.      [provider]  diclofenac Sodium (VOLTAREN) 1 % GEL Apply 4 g topically 4 (four) times daily. 02/26/20   Georgetta Haber, NP  dicyclomine (BENTYL) 20 MG tablet TAKE 1/2 TABLET BY MOUTH THREE TIMES DAILY BEFORE MEALS 11/10/19   [name],  Austin Miles, MD  diltiazem [facility]) 120 MG 24 hr capsule TAKE ONE CAPSULE BY MOUTH DAILY 05/25/19   [name], MD  dorzolamide (TRUSOPT) 2 % ophthalmic solution Place into both eyes 2 (two) times daily.  01/13/14   [provider]  escitalopram (LEXAPRO) 10 MG tablet TAKE 2 TABLETS(20 MG) BY MOUTH DAILY 12/16/19   [name], MD  fluticasone [facility]) 50 MCG/ACT nasal spray Place 2 sprays into both nostrils daily. 05/17/18   Cathie Hoops, Amy V, PA-C  furosemide (LASIX) 40 MG tablet TAKE 1 TABLET(40 MG) BY MOUTH DAILY 10/26/19   [name], MD  gabapentin (NEURONTIN) 100 MG capsule Take 200 mg by mouth at bedtime. 10/19/19   Melina Fiddler, MD  ibuprofen (ADVIL) 800 MG tablet TAKE 1 TABLET(800  MG) BY MOUTH DAILY AS NEEDED FOR MODERATE PAIN 01/18/20   [name], MD  ipratropium (ATROVENT) 0.06 % nasal spray Place 2 sprays into both nostrils 4 (four) times daily. 05/17/18   Cathie Hoops, Amy V, PA-C  latanoprost (XALATAN) 0.005 % ophthalmic solution Place 1 drop into both eyes daily. 10/22/12   Norins, Rosalyn Gess, MD  levalbuterol [facility]) 1.25 MG/3ML nebulizer solution Take 1.25 mg by nebulization as needed. 10/22/12   Norins, Rosalyn Gess, MD  levocetirizine (XYZAL) 5 MG tablet TK 1 T PO QD IN THE EVE 02/23/18   [provider]  losartan (COZAAR) 100 MG tablet TAKE 1 TABLET BY MOUTH EVERY DAY 01/13/20   [name], MD  multivitamin [facility]) tablet Take 1 tablet by mouth daily.      [provider]  omeprazole (PRILOSEC) 40 MG capsule TAKE 1 CAPSULE BY MOUTH EVERY DAY 06/03/19   [name], MD  potassium chloride (KLOR-CON) 8 MEQ tablet Take 1 tablet (8 mEq total) by mouth daily. 06/03/19   [name], MD  sodium chloride (OCEAN) 0.65 % SOLN nasal spray Place 1 spray into both nostrils as needed for congestion. 09/21/19   Darr, Veryl Speak, PA-C  SUMAtriptan (IMITREX) 100 MG tablet Take 1 tablet (100 mg total) by mouth once. Take 1 tablet at onset of headache 03/08/16 03/28/16  [name], MD  diltiazem (DILACOR XR) 120 MG 24 hr capsule Take 1 capsule (120 mg total) by mouth daily. 10/22/11 10/11/12  Norins, Rosalyn Gess, MD    Family History Family History  Problem Relation Age of Onset  . Hypertension Father   . Coronary artery disease Father        MI/ Fatal  . Breast cancer Neg Hx   . Diabetes Neg Hx   . Colon cancer Neg Hx     Social History Social History   Tobacco Use  . Smoking status: Never Smoker  . Smokeless tobacco: Never Used  Vaping Use  . Vaping Use: Never used  Substance Use Topics  . Alcohol use: Not Currently  . Drug use: Not Currently     Allergies   Enalapril maleate and Sulfamethoxazole-trimethoprim    Review of Systems Review of Systems   Physical Exam Triage Vital Signs ED Triage Vitals  Enc Vitals Group     BP 02/26/20 1411 (!) 176/86     Pulse Rate 02/26/20 1411 64     Resp 02/26/20 1411 18     Temp 02/26/20 1411 97.9 F (36.6 C)     Temp Source 02/26/20 1411 Oral     SpO2 02/26/20 1411 100 %     Weight --  Height --      Head Circumference --      Peak Flow --      Pain Score 02/26/20 1408 3     Pain Loc --      Pain Edu? --      Excl. in GC? --    No data found.  Updated Vital Signs BP (!) 176/86 (BP Location: Right Arm)   Pulse 64   Temp 97.9 F (36.6 C) (Oral)   Resp 18   SpO2 100%   Visual Acuity Right Eye Distance:   Left Eye Distance:   Bilateral Distance:    Right Eye Near:   Left Eye Near:    Bilateral Near:     Physical Exam Constitutional:      General: She is not in acute distress.    Appearance: She is well-developed.  Cardiovascular:     Rate and Rhythm: Normal rate.  Pulmonary:     Effort: Pulmonary effort is normal.  Chest:       Comments: Very small area of point tenderness to right lateral chest wall, mild. No difficulty breathing, shortness of breath or pain with inspiration Musculoskeletal:     Lumbar back: No tenderness or bony tenderness. Normal range of motion. Negative right straight leg raise test and negative left straight leg raise test.     Left hip: Tenderness and bony tenderness present. Normal range of motion. Normal strength.     Comments: Very mild left proximal femur and hip tenderness on palpation without limitation in ROM; strength equal bilaterally; gross sensation intact to lower extremities; ambulatory without difficulty; healing abrasions without redness swelling or warmth to left knee without tenderness  Skin:    General: Skin is warm and dry.  Neurological:     Mental Status: She is alert and oriented to person, place, and time.      UC Treatments / Results  Labs (all labs ordered are listed, but  only abnormal results are displayed) Labs Reviewed - No data to display  EKG   Radiology DG Hip Unilat With Pelvis 2-3 Views Left  Result Date: 02/26/2020 CLINICAL DATA:  Left hip pain after fall EXAM: DG HIP (WITH OR WITHOUT PELVIS) 2-3V LEFT COMPARISON:  None. FINDINGS: Joint space narrowing and spurring within the hip joints bilaterally, symmetric. SI joints symmetric and unremarkable. No acute bony abnormality. Specifically, no fracture, subluxation, or dislocation. IMPRESSION: Mild degenerative changes in the hips bilaterally. No acute bony abnormality. Electronically Signed   By: Charlett NoseKevin  Dover M.D.   On: 02/26/2020 16:29    Procedures Procedures (including critical care time)  Medications Ordered in UC Medications - No data to display  Initial Impression / Assessment and Plan / UC Course  I have reviewed the triage vital signs and the nursing notes.  Pertinent labs & imaging results that were available during my care of the patient were reviewed by me and considered in my medical decision making (see chart for details).     Contusions and soreness s/p fall on deck earlier this week. Pelvic/hip xray without acute findings. Pain management discussed and follow up recommendations provided. Patient verbalized understanding and agreeable to plan.   Final Clinical Impressions(s) / UC Diagnoses   Final diagnoses:  Fall  Left hip pain  Chest wall pain     Discharge Instructions     Your xray does not show any indication of any breaks or fractures.  Continue to follow with your orthopedist as needed for persistent symptoms.  Tylenol as needed for pain.  Voltaren gel topically to affected areas may be helpful as well.     ED Prescriptions    Medication Sig Dispense Auth. Provider   diclofenac Sodium (VOLTAREN) 1 % GEL Apply 4 g topically 4 (four) times daily. 350 g Georgetta Haber, NP     PDMP not reviewed this encounter.   Georgetta Haber, NP 02/27/20 0945

## 2020-03-14 ENCOUNTER — Other Ambulatory Visit: Payer: Self-pay | Admitting: Internal Medicine

## 2020-03-15 DIAGNOSIS — S8002XA Contusion of left knee, initial encounter: Secondary | ICD-10-CM | POA: Diagnosis not present

## 2020-03-15 DIAGNOSIS — S7002XA Contusion of left hip, initial encounter: Secondary | ICD-10-CM | POA: Diagnosis not present

## 2020-03-15 DIAGNOSIS — M25552 Pain in left hip: Secondary | ICD-10-CM | POA: Diagnosis not present

## 2020-03-27 ENCOUNTER — Other Ambulatory Visit: Payer: Self-pay | Admitting: Internal Medicine

## 2020-03-29 DIAGNOSIS — H2000 Unspecified acute and subacute iridocyclitis: Secondary | ICD-10-CM | POA: Diagnosis not present

## 2020-04-12 DIAGNOSIS — H401232 Low-tension glaucoma, bilateral, moderate stage: Secondary | ICD-10-CM | POA: Diagnosis not present

## 2020-04-12 DIAGNOSIS — H2000 Unspecified acute and subacute iridocyclitis: Secondary | ICD-10-CM | POA: Diagnosis not present

## 2020-04-12 DIAGNOSIS — H35351 Cystoid macular degeneration, right eye: Secondary | ICD-10-CM | POA: Diagnosis not present

## 2020-04-17 MED ORDER — BUPROPION HCL ER (XL) 150 MG PO TB24: 150.0000 mg | ORAL_TABLET | Freq: Every day | ORAL | 0 refills | Status: DC

## 2020-04-17 NOTE — Telephone Encounter (Signed)
Sent in 30 day, needs visit within that time as she has not followed up since we started that medication.

## 2020-04-17 NOTE — Addendum Note (Signed)
Addended by: Hillard Danker A on: 04/17/2020 08:41 AM   Modules accepted: Orders

## 2020-04-17 NOTE — Telephone Encounter (Addendum)
    Patient calling for refill on Wellbutrin, no medication remaining

## 2020-04-19 DIAGNOSIS — M5416 Radiculopathy, lumbar region: Secondary | ICD-10-CM | POA: Diagnosis not present

## 2020-04-24 ENCOUNTER — Other Ambulatory Visit: Payer: Self-pay

## 2020-04-25 ENCOUNTER — Encounter: Payer: Self-pay | Admitting: Internal Medicine

## 2020-04-25 ENCOUNTER — Ambulatory Visit (INDEPENDENT_AMBULATORY_CARE_PROVIDER_SITE_OTHER): Payer: PPO | Admitting: Internal Medicine

## 2020-04-25 ENCOUNTER — Ambulatory Visit: Payer: PPO | Admitting: Pharmacist

## 2020-04-25 VITALS — BP 142/80 | HR 66 | Temp 98.6°F | Ht 66.0 in | Wt 198.0 lb

## 2020-04-25 DIAGNOSIS — I10 Essential (primary) hypertension: Secondary | ICD-10-CM

## 2020-04-25 DIAGNOSIS — J4520 Mild intermittent asthma, uncomplicated: Secondary | ICD-10-CM

## 2020-04-25 DIAGNOSIS — F411 Generalized anxiety disorder: Secondary | ICD-10-CM

## 2020-04-25 MED ORDER — OMEPRAZOLE 40 MG PO CPDR: DELAYED_RELEASE_CAPSULE | ORAL | 3 refills | Status: DC

## 2020-04-25 MED ORDER — BUPROPION HCL ER (XL) 150 MG PO TB24: 150.0000 mg | ORAL_TABLET | Freq: Every day | ORAL | 3 refills | Status: DC

## 2020-04-25 NOTE — Patient Instructions (Signed)
We will keep you on the lexapro and wellbutrin.

## 2020-04-25 NOTE — Patient Instructions (Signed)
Visit Information  Phone number for Pharmacist: [phone]  Goals Addressed            This Visit's Progress   . Pharmacy Care Plan       CARE PLAN ENTRY (see longitudinal plan of care for additional care plan information)  Current Barriers:  . Chronic Disease Management support, education, and care coordination needs related to Hypertension, Depression, and Anxiety   Hypertension BP Readings from Last 3 Encounters:  04/25/20 (!) 142/80  02/26/20 (!) 176/86  11/24/19 (!) 158/92 .  Pharmacist Clinical Goal(s): o Over the next 180 days, patient will work with PharmD and providers to maintain BP goal <140/90 . Current regimen:  o Diltiazem 120 mg daily  o Losartan 100 mg daily  o Furosemide 40 mg  . Interventions: o Discussed BP goals and benefits of medication for prevention of heart attack / stroke o Discussed benefits of taking blood pressure medication at night . Patient self care activities - Over the next 180 days, patient will: o Check BP 1-2 times weekly, document, and provide at future appointments o Ensure daily salt intake < 2300 mg/day  Depression / Anxiety . Pharmacist Clinical Goal(s) o Over the next 180 days, patient will work with PharmD and providers to optimize therapy . Current regimen:  o Clonazepam 0.5 mg daily PRN  o Escitalopram 10 mg 2 tabs daily o Bupropion XL 150 mg daily . Interventions: o Bupropion added July 2021 and significant improvement noted . Patient self care activities - Over the next 180 days, patient will: o Take medications as prescribed  Medication management . Pharmacist Clinical Goal(s): o Over the next 180 days, patient will work with PharmD and providers to maintain optimal medication adherence . Current pharmacy: [facility] . Interventions o Comprehensive medication review performed. o Continue current medication management strategy . Patient self care activities - Over the next 180 days, patient will: o Focus on  medication adherence by patient report o Take medications as prescribed o Report any questions or concerns to PharmD and/or provider(s)  Please see past updates related to this goal by clicking on the "Past Updates" button in the selected goal       The patient verbalized understanding of instructions, educational materials, and care plan provided today and declined offer to receive copy of patient instructions, educational materials, and care plan.  Telephone follow up appointment with pharmacy team member scheduled for: 6 months  Al Corpus, PharmD, [facility] Clinical Pharmacist [facility] [phone]

## 2020-04-25 NOTE — Chronic Care Management (AMB) (Signed)
Chronic Care Management Pharmacy  Name: Dominique Johnston  MRN: [id number] DOB: 1946-04-25   Chief Complaint/ HPI  Dominique Johnston,  74 y.o. , female presents for her Follow-Up CCM visit with the clinical pharmacist via telephone due to COVID-19 Pandemic.  PCP : Myrlene Brokerrawford, Elizabeth A, MD Patient Care Team: Myrlene Brokerrawford, Elizabeth A, MD as PCP - General (Internal Medicine) Sidney AceSharma, Ranjan, MD (Allergy) Romine, Edwena Feltyynthia P, MD (Obstetrics and Gynecology) Melina FiddlerBassett, Rebecca S, MD as Consulting Physician (Sports Medicine) Antony ContrasLyles, Graham, MD as Consulting Physician (Ophthalmology) Kathyrn SheriffFoltanski, Oluwakemi Salsberry N, [facility] as Pharmacist (Pharmacist)  Their chronic conditions include: Hypertension, Hyperlipidemia, GERD, Asthma, Anxiety and Osteoarthritis   Pt was married for 29 years, ex-husband was [employer] now [place], moved frequently. Lived in [place], [place], [place], [place], [place] children in [place]. [place], [place]. Graduated from [employer] in [place]. "My life has been a soap opera". Lost her mom last year at [age] which has been very hard as they were very close. Lost her sister at 2725 in car accident. 2 brothers still living. Father died at 6673. She is oldest of her siblings.   Office Visits: 11/24/19 Dr Okey Duprerawford OV: added bupropion XL 150 mg. Also discussed counseling.  11/01/19: Patient presented to Dr. Okey Duprerawford for follow-up. Benzonatate stopped (patient no longer taking)  Consult Visit: 02/26/20 urgent care: s/p fall, L hip pain, chest wall pain. 09/21/19: Patient presented to Urgent Care for acute sinusitis. Patient discharged with Augmentin x 10 days, benzonatate, saline 0.65% nasal spray.  07/28/19: Patient presented to Dr. Maurice SmallIbazebo (physical medicine and rehabilitation). Patient received dexamethasone injection in back.  06/02/19: Patient presented to Dr. Cleophas DunkerBassett (sports medicine) for radiculopathy 05/28/19: Patient presented to Dr. Lynn CallasSharma (allergy) for  follow-up.   Allergies  Allergen Reactions  . Enalapril Maleate     REACTION: swelling, headache  . Sulfamethoxazole-Trimethoprim     Abdominal pain, redness eyes    Medications: Outpatient Encounter Medications as of 04/25/2020  Medication Sig Note  . albuterol (PROVENTIL HFA;VENTOLIN HFA) 108 (90 Base) MCG/ACT inhaler Inhale 2 puffs into the lungs every 6 (six) hours as needed.   . Ascorbic Acid (VITAMIN C PO) Take 1 tablet by mouth daily.   Marland Kitchen. BREO ELLIPTA 200-25 MCG/INH AEPB INHALE 1 PUFF PO QD 03/14/2016: Received from: External Pharmacy  . buPROPion (WELLBUTRIN XL) 150 MG 24 hr tablet Take 1 tablet (150 mg total) by mouth daily.   . clonazePAM (KLONOPIN) 0.5 MG tablet Take 1 tablet (0.5 mg total) by mouth daily as needed. for anxiety   . Cyanocobalamin (B-12 PO) Take by mouth daily.   . diclofenac Sodium (VOLTAREN) 1 % GEL Apply 4 g topically 4 (four) times daily.   Marland Kitchen. dicyclomine (BENTYL) 20 MG tablet TAKE 1/2 TABLET BY MOUTH THREE TIMES DAILY BEFORE MEALS   . diltiazem (TIAZAC) 120 MG 24 hr capsule TAKE ONE CAPSULE BY MOUTH DAILY   . dorzolamide (TRUSOPT) 2 % ophthalmic solution Place into both eyes 2 (two) times daily.  03/02/2014: Received from: External Pharmacy  . escitalopram (LEXAPRO) 10 MG tablet TAKE 2 TABLETS(20 MG) BY MOUTH DAILY   . fluticasone (FLONASE) 50 MCG/ACT nasal spray Place 2 sprays into both nostrils daily.   . furosemide (LASIX) 40 MG tablet TAKE 1 TABLET(40 MG) BY MOUTH DAILY   . gabapentin (NEURONTIN) 100 MG capsule Take 200 mg by mouth at bedtime.   Marland Kitchen. ibuprofen (ADVIL) 800 MG tablet TAKE 1 TABLET(800 MG) BY MOUTH  DAILY AS NEEDED FOR MODERATE PAIN   . ipratropium (ATROVENT) 0.06 % nasal spray Place 2 sprays into both nostrils 4 (four) times daily.   Marland Kitchen latanoprost (XALATAN) 0.005 % ophthalmic solution Place 1 drop into both eyes daily.   Marland Kitchen levalbuterol (XOPENEX) 1.25 MG/3ML nebulizer solution Take 1.25 mg by nebulization as needed. 02/27/2015: Takes as needed    . levocetirizine (XYZAL) 5 MG tablet TK 1 T PO QD IN THE EVE   . losartan (COZAAR) 100 MG tablet TAKE 1 TABLET BY MOUTH EVERY DAY   . multivitamin (THERAGRAN) tablet Take 1 tablet by mouth daily.   Marland Kitchen omeprazole (PRILOSEC) 40 MG capsule TAKE 1 CAPSULE BY MOUTH EVERY DAY   . potassium chloride (KLOR-CON) 8 MEQ tablet Take 1 tablet (8 mEq total) by mouth daily.   . prednisoLONE acetate (PRED FORTE) 1 % ophthalmic suspension    . sodium chloride (OCEAN) 0.65 % SOLN nasal spray Place 1 spray into both nostrils as needed for congestion.   . SUMAtriptan (IMITREX) 100 MG tablet Take 1 tablet (100 mg total) by mouth once. Take 1 tablet at onset of headache   . [DISCONTINUED] diltiazem (DILACOR XR) 120 MG 24 hr capsule Take 1 capsule (120 mg total) by mouth daily.    No facility-administered encounter medications on file as of 04/25/2020.   Wt Readings from Last 3 Encounters:  04/25/20 198 lb (89.8 kg)  11/24/19 205 lb (93 kg)  11/01/19 201 lb (91.2 kg)   Lab Results  Component Value Date   CREATININE 1.01 03/22/2019   BUN 15 03/22/2019   GFR 64.97 03/22/2019   GFRNONAA 64.40 07/19/2009   GFRAA 65 07/18/2008   NA 139 03/22/2019   K 3.8 03/22/2019   CALCIUM 9.0 03/22/2019   CO2 29 03/22/2019    Current Diagnosis/Assessment:    Goals Addressed            This Visit's Progress   . Pharmacy Care Plan       CARE PLAN ENTRY (see longitudinal plan of care for additional care plan information)  Current Barriers:  . Chronic Disease Management support, education, and care coordination needs related to Hypertension, Depression, and Anxiety   Hypertension BP Readings from Last 3 Encounters:  04/25/20 (!) 142/80  02/26/20 (!) 176/86  11/24/19 (!) 158/92 .  Pharmacist Clinical Goal(s): o Over the next 180 days, patient will work with PharmD and providers to maintain BP goal <140/90 . Current regimen:  o Diltiazem 120 mg daily  o Losartan 100 mg daily  o Furosemide 40 mg   . Interventions: o Discussed BP goals and benefits of medication for prevention of heart attack / stroke o Discussed benefits of taking blood pressure medication at night . Patient self care activities - Over the next 180 days, patient will: o Check BP 1-2 times weekly, document, and provide at future appointments o Ensure daily salt intake < 2300 mg/day  Depression / Anxiety . Pharmacist Clinical Goal(s) o Over the next 180 days, patient will work with PharmD and providers to optimize therapy . Current regimen:  o Clonazepam 0.5 mg daily PRN  o Escitalopram 10 mg 2 tabs daily o Bupropion XL 150 mg daily . Interventions: o Bupropion added July 2021 and significant improvement noted . Patient self care activities - Over the next 180 days, patient will: o Take medications as prescribed  Medication management . Pharmacist Clinical Goal(s): o Over the next 180 days, patient will work with PharmD and providers to  maintain optimal medication adherence . Current pharmacy: [facility] . Interventions o Comprehensive medication review performed. o Continue current medication management strategy . Patient self care activities - Over the next 180 days, patient will: o Focus on medication adherence by patient report o Take medications as prescribed o Report any questions or concerns to PharmD and/or provider(s)  Please see past updates related to this goal by clicking on the "Past Updates" button in the selected goal        Asthma    Last spirometry score: n/a  Tobacco Status:  Social History   Tobacco Use  Smoking Status Never Smoker  Smokeless Tobacco Never Used   Patient has failed these meds in past: n/a Patient is currently controlled on the  following medications:  . Albuterol HFA 108 mcg/act 2 puff q6hr PRN  . Breo Ellipta 200-25 mcg/inh 1 puff daily  . Levalbuterol 1.25mg /25mL neb PRN   Using maintenance inhaler regularly? Yes Frequency of rescue inhaler use:   infrequently  We discussed:  Pt reports symptoms are generally well controlled; she does have worse SOB/coughing during spring/pollen; also reports Virgel Bouquet is generally affordable, price is high ~last 3 months of the year during donut hole but this is usually manageable.  Plan  Continue current medications   Hypertension   BP goal is:  <140/90  Office blood pressures are  BP Readings from Last 3 Encounters:  04/25/20 (!) 142/80  02/26/20 (!) 176/86  11/24/19 (!) 158/92   Patient checks BP at home several times per month Patient home BP readings are ranging: 129/72 - 134/74  Patient has failed these meds in the past: n/a Patient is currently controlled on the following medications:  . Diltiazem 120 mg daily PM . Losartan 100 mg daily PM  Furosemide 40 mg daily  We discussed BP goals, optimal timing of BP medications at bedtime for most effective control; does not take furosemide every single day if she is feeling dehydrated.    Plan  Continue current medications   Depression / Anxiety   Depression screen [facility] 2/9 04/27/2019 04/01/2018 03/31/2017  Decreased Interest 1 2 0  Down, Depressed, Hopeless 1 2 2   PHQ - 2 Score 2 4 2   Altered sleeping 1 0 0  Tired, decreased energy 0 2 0  Change in appetite 0 0 0  Feeling bad or failure about yourself  0 1 0  Trouble concentrating 0 0 0  Moving slowly or fidgety/restless 0 0 0  Suicidal thoughts 0 0 0  PHQ-9 Score 3 7 2   Difficult doing work/chores Somewhat difficult Not difficult at all Not difficult at all   GAD7 Score: No flowsheet data found.  Patient has failed these meds in past: sertraline, venlafaxine Patient is currently controlled on the following medications:  . Clonazepam 0.5 mg daily PRN  . Escitalopram 10 mg 2 tabs daily . Bupropion XL 150 mg daily  We discussed: Pt started bupropion in July, has noticed significant improvement.    Plan  Continue current medications    Medication Management    Patient's preferred pharmacy is:  [facility] - [place], [place] - [address] AT [facility] [place] [address]  [id number] Phone: [phone] Fax: [phone]  Uses pill box? No - prefers bottles Pt endorses 100% compliance  We discussed: Discussed benefits of medication synchronization, packaging and delivery as well as enhanced pharmacist oversight with [employer]. Pt is happy with [facility], on auto-refill, and  would like to continue.  Plan  Continue current medication management strategy    Follow up: 6 month phone visit  Al Corpus, PharmD, [facility] Clinical Pharmacist [place] [phone]

## 2020-04-25 NOTE — Assessment & Plan Note (Signed)
Checking labs and adjust diltiazem and lasix as needed.

## 2020-04-25 NOTE — Assessment & Plan Note (Signed)
Doing better with lexapro 20 mg daily and wellbutrin 150 mg daily. Will continue with dual therapy for now. When life is less stressful she may want to resume monotherapy.

## 2020-04-25 NOTE — Progress Notes (Signed)
   Subjective:   Patient ID: Dominique Johnston, female    DOB: 1945/10/30, 74 y.o.   MRN: [id number]  HPI The patient is a 74 YO female coming in for follow up depression. We did add on wellbutrin to lexapro treatment at last visit. She has noticed some significant benefit. Still going through a lot of life stressors. Having appropriate emotions and sadness about that. Feels like others have noticed her being more herself as well. Denies side effects. Denies SI/HI.   Review of Systems  Constitutional: Negative.   HENT: Negative.   Eyes: Negative.   Respiratory: Negative for cough, chest tightness and shortness of breath.   Cardiovascular: Negative for chest pain, palpitations and leg swelling.  Gastrointestinal: Negative for abdominal distention, abdominal pain, constipation, diarrhea, nausea and vomiting.  Musculoskeletal: Negative.   Skin: Negative.   Neurological: Negative.   Psychiatric/Behavioral: Negative.     Objective:  Physical Exam Constitutional:      Appearance: She is well-developed and well-nourished.  HENT:     Head: Normocephalic and atraumatic.  Eyes:     Extraocular Movements: EOM normal.  Cardiovascular:     Rate and Rhythm: Normal rate and regular rhythm.  Pulmonary:     Effort: Pulmonary effort is normal. No respiratory distress.     Breath sounds: Normal breath sounds. No wheezing or rales.  Abdominal:     General: Bowel sounds are normal. There is no distension.     Palpations: Abdomen is soft.     Tenderness: There is no abdominal tenderness. There is no rebound.  Musculoskeletal:        General: No edema.     Cervical back: Normal range of motion.  Skin:    General: Skin is warm and dry.  Neurological:     Mental Status: She is alert and oriented to person, place, and time.     Coordination: Coordination normal.  Psychiatric:        Mood and Affect: Mood and affect normal.     Vitals:   04/25/20 1056  BP: (!) 142/80  Pulse: 66  Temp:  98.6 F (37 C)  TempSrc: Oral  SpO2: 96%  Weight: 198 lb (89.8 kg)  Height: 5\' 6"  (1.676 m)    This visit occurred during the SARS-CoV-2 public health emergency.  Safety protocols were in place, including screening questions prior to the visit, additional usage of staff PPE, and extensive cleaning of exam room while observing appropriate contact time as indicated for disinfecting solutions.   Assessment & Plan:

## 2020-05-09 ENCOUNTER — Ambulatory Visit (INDEPENDENT_AMBULATORY_CARE_PROVIDER_SITE_OTHER): Payer: PPO

## 2020-05-09 DIAGNOSIS — Z0000 Encounter for general adult medical examination without abnormal findings: Secondary | ICD-10-CM | POA: Diagnosis not present

## 2020-05-09 NOTE — Patient Instructions (Signed)
Dominique Johnston , Thank you for taking time to come for your Medicare Wellness Visit. I appreciate your ongoing commitment to your health goals. Please review the following plan we discussed and let me know if I can assist you in the future.   Screening recommendations/referrals: Colonoscopy: 12/24/2012; due every 10 years Mammogram: 06/15/2019 Bone Density: 03/28/2016; due every 5 years Recommended yearly ophthalmology/optometry visit for glaucoma screening and checkup Recommended yearly dental visit for hygiene and checkup  Vaccinations: Influenza vaccine: 03/13/2020 Pneumococcal vaccine: up to date Tdap vaccine: 05/03/2019; due every 10 years Shingles vaccine: never done; will check with insurance or local pharmacy for coverage   Covid-19: up to date  Advanced directives: Advance directive discussed with you today. I have provided a copy for you to complete at home and have notarized. Once this is complete please bring a copy in to our office so we can scan it into your chart.  Conditions/risks identified: Yes; Reviewed health maintenance screenings with patient today and relevant education, vaccines, and/or referrals were provided. Please continue to do your personal lifestyle choices by: daily care of teeth and gums, regular physical activity (goal should be 5 days a week for 30 minutes), eat a healthy diet, avoid tobacco and drug use, limiting any alcohol intake, taking a low-dose aspirin (if not allergic or have been advised by your provider otherwise) and taking vitamins and minerals as recommended by your provider. Continue doing brain stimulating activities (puzzles, reading, adult coloring books, staying active) to keep memory sharp. Continue to eat heart healthy diet (full of fruits, vegetables, whole grains, lean protein, water--limit salt, fat, and sugar intake) and increase physical activity as tolerated.  Next appointment: Please schedule your next Medicare Wellness Visit with your Nurse  Health Advisor in 1 year by calling [phone].   Preventive Care 5 Years and Older, Female Preventive care refers to lifestyle choices and visits with your health care provider that can promote health and wellness. What does preventive care include?  A yearly physical exam. This is also called an annual well check.  Dental exams once or twice a year.  Routine eye exams. Ask your health care provider how often you should have your eyes checked.  Personal lifestyle choices, including:  Daily care of your teeth and gums.  Regular physical activity.  Eating a healthy diet.  Avoiding tobacco and drug use.  Limiting alcohol use.  Practicing safe sex.  Taking low-dose aspirin every day.  Taking vitamin and mineral supplements as recommended by your health care provider. What happens during an annual well check? The services and screenings done by your health care provider during your annual well check will depend on your age, overall health, lifestyle risk factors, and family history of disease. Counseling  Your health care provider may ask you questions about your:  Alcohol use.  Tobacco use.  Drug use.  Emotional well-being.  Home and relationship well-being.  Sexual activity.  Eating habits.  History of falls.  Memory and ability to understand (cognition).  Work and work Astronomer.  Reproductive health. Screening  You may have the following tests or measurements:  Height, weight, and BMI.  Blood pressure.  Lipid and cholesterol levels. These may be checked every 5 years, or more frequently if you are over 4 years old.  Skin check.  Lung cancer screening. You may have this screening every year starting at age 39 if you have a 30-pack-year history of smoking and currently smoke or have quit within the past 15  years.  Fecal occult blood test (FOBT) of the stool. You may have this test every year starting at age 5.  Flexible sigmoidoscopy or  colonoscopy. You may have a sigmoidoscopy every 5 years or a colonoscopy every 10 years starting at age 81.  Hepatitis C blood test.  Hepatitis B blood test.  Sexually transmitted disease (STD) testing.  Diabetes screening. This is done by checking your blood sugar (glucose) after you have not eaten for a while (fasting). You may have this done every 1-3 years.  Bone density scan. This is done to screen for osteoporosis. You may have this done starting at age 19.  Mammogram. This may be done every 1-2 years. Talk to your health care provider about how often you should have regular mammograms. Talk with your health care provider about your test results, treatment options, and if necessary, the need for more tests. Vaccines  Your health care provider may recommend certain vaccines, such as:  Influenza vaccine. This is recommended every year.  Tetanus, diphtheria, and acellular pertussis (Tdap, Td) vaccine. You may need a Td booster every 10 years.  Zoster vaccine. You may need this after age 35.  Pneumococcal 13-valent conjugate (PCV13) vaccine. One dose is recommended after age 85.  Pneumococcal polysaccharide (PPSV23) vaccine. One dose is recommended after age 8. Talk to your health care provider about which screenings and vaccines you need and how often you need them. This information is not intended to replace advice given to you by your health care provider. Make sure you discuss any questions you have with your health care provider. Document Released: 05/26/2015 Document Revised: 01/17/2016 Document Reviewed: 02/28/2015 Elsevier Interactive Patient Education  2017 [employer].  Fall Prevention in the Home Falls can cause injuries. They can happen to people of all ages. There are many things you can do to make your home safe and to help prevent falls. What can I do on the outside of my home?  Regularly fix the edges of walkways and driveways and fix any cracks.  Remove  anything that might make you trip as you walk through a door, such as a raised step or threshold.  Trim any bushes or trees on the path to your home.  Use bright outdoor lighting.  Clear any walking paths of anything that might make someone trip, such as rocks or tools.  Regularly check to see if handrails are loose or broken. Make sure that both sides of any steps have handrails.  Any raised decks and porches should have guardrails on the edges.  Have any leaves, snow, or ice cleared regularly.  Use sand or salt on walking paths during winter.  Clean up any spills in your garage right away. This includes oil or grease spills. What can I do in the bathroom?  Use night lights.  Install grab bars by the toilet and in the tub and shower. Do not use towel bars as grab bars.  Use non-skid mats or decals in the tub or shower.  If you need to sit down in the shower, use a plastic, non-slip stool.  Keep the floor dry. Clean up any water that spills on the floor as soon as it happens.  Remove soap buildup in the tub or shower regularly.  Attach bath mats securely with double-sided non-slip rug tape.  Do not have throw rugs and other things on the floor that can make you trip. What can I do in the bedroom?  Use night lights.  Make  sure that you have a light by your bed that is easy to reach.  Do not use any sheets or blankets that are too big for your bed. They should not hang down onto the floor.  Have a firm chair that has side arms. You can use this for support while you get dressed.  Do not have throw rugs and other things on the floor that can make you trip. What can I do in the kitchen?  Clean up any spills right away.  Avoid walking on wet floors.  Keep items that you use a lot in easy-to-reach places.  If you need to reach something above you, use a strong step stool that has a grab bar.  Keep electrical cords out of the way.  Do not use floor polish or wax that  makes floors slippery. If you must use wax, use non-skid floor wax.  Do not have throw rugs and other things on the floor that can make you trip. What can I do with my stairs?  Do not leave any items on the stairs.  Make sure that there are handrails on both sides of the stairs and use them. Fix handrails that are broken or loose. Make sure that handrails are as long as the stairways.  Check any carpeting to make sure that it is firmly attached to the stairs. Fix any carpet that is loose or worn.  Avoid having throw rugs at the top or bottom of the stairs. If you do have throw rugs, attach them to the floor with carpet tape.  Make sure that you have a light switch at the top of the stairs and the bottom of the stairs. If you do not have them, ask someone to add them for you. What else can I do to help prevent falls?  Wear shoes that:  Do not have high heels.  Have rubber bottoms.  Are comfortable and fit you well.  Are closed at the toe. Do not wear sandals.  If you use a stepladder:  Make sure that it is fully opened. Do not climb a closed stepladder.  Make sure that both sides of the stepladder are locked into place.  Ask someone to hold it for you, if possible.  Clearly mark and make sure that you can see:  Any grab bars or handrails.  First and last steps.  Where the edge of each step is.  Use tools that help you move around (mobility aids) if they are needed. These include:  Canes.  Walkers.  Scooters.  Crutches.  Turn on the lights when you go into a dark area. Replace any light bulbs as soon as they burn out.  Set up your furniture so you have a clear path. Avoid moving your furniture around.  If any of your floors are uneven, fix them.  If there are any pets around you, be aware of where they are.  Review your medicines with your doctor. Some medicines can make you feel dizzy. This can increase your chance of falling. Ask your doctor what other  things that you can do to help prevent falls. This information is not intended to replace advice given to you by your health care provider. Make sure you discuss any questions you have with your health care provider. Document Released: 02/23/2009 Document Revised: 10/05/2015 Document Reviewed: 06/03/2014 Elsevier Interactive Patient Education  2017 [employer].

## 2020-05-09 NOTE — Progress Notes (Addendum)
I connected with Dominique Johnston today by telephone and verified that I am speaking with the correct person using two identifiers. Location patient: home Location provider: work Persons participating in the virtual visit: Dominique Johnston and Lisette Abu, LPN.   I discussed the limitations, risks, security and privacy concerns of performing an evaluation and management service by telephone and the availability of in person appointments. I also discussed with the patient that there may be a patient responsible charge related to this service. The patient expressed understanding and verbally consented to this telephonic visit.    Interactive audio and video telecommunications were attempted between this provider and patient, however failed, due to patient having technical difficulties OR patient did not have access to video capability.  We continued and completed visit with audio only.  Some vital signs may be absent or patient reported.   Time Spent with patient on telephone encounter: 40 minutes  Subjective:   Dominique Johnston is a 74 y.o. female who presents for Medicare Annual (Subsequent) preventive examination.  Review of Systems    No ROS. Medicare Wellness Visit. Additional risk factors are reflected in social history. Cardiac Risk Factors include: advanced age (>69men, >46 women);family history of premature cardiovascular disease;hypertension     Objective:    There were no vitals filed for this visit. There is no height or weight on file to calculate BMI.  Advanced Directives 05/09/2020 04/27/2019 04/01/2018 03/31/2017 10/08/2016 02/27/2015  Does Patient Have a Medical Advance Directive? No No No No No No  Does patient want to make changes to medical advance directive? - Yes (ED - Information included in AVS) Yes (ED - Information included in AVS) - - -  Would patient like information on creating a medical advance directive? Yes (MAU/Ambulatory/Procedural Areas -  Information given) - - Yes (ED - Information included in AVS) - No - patient declined information    Current Medications (verified) Outpatient Encounter Medications as of 05/09/2020  Medication Sig   albuterol (PROVENTIL HFA;VENTOLIN HFA) 108 (90 Base) MCG/ACT inhaler Inhale 2 puffs into the lungs every 6 (six) hours as needed.   Ascorbic Acid (VITAMIN C PO) Take 1 tablet by mouth daily.   BREO ELLIPTA 200-25 MCG/INH AEPB INHALE 1 PUFF PO QD   buPROPion (WELLBUTRIN XL) 150 MG 24 hr tablet Take 1 tablet (150 mg total) by mouth daily.   clonazePAM (KLONOPIN) 0.5 MG tablet Take 1 tablet (0.5 mg total) by mouth daily as needed. for anxiety   Cyanocobalamin (B-12 PO) Take by mouth daily.   diclofenac Sodium (VOLTAREN) 1 % GEL Apply 4 g topically 4 (four) times daily.   dicyclomine (BENTYL) 20 MG tablet TAKE 1/2 TABLET BY MOUTH THREE TIMES DAILY BEFORE MEALS   diltiazem (TIAZAC) 120 MG 24 hr capsule TAKE ONE CAPSULE BY MOUTH DAILY   dorzolamide (TRUSOPT) 2 % ophthalmic solution Place into both eyes 2 (two) times daily.    escitalopram (LEXAPRO) 10 MG tablet TAKE 2 TABLETS(20 MG) BY MOUTH DAILY   fluticasone (FLONASE) 50 MCG/ACT nasal spray Place 2 sprays into both nostrils daily.   furosemide (LASIX) 40 MG tablet TAKE 1 TABLET(40 MG) BY MOUTH DAILY   gabapentin (NEURONTIN) 100 MG capsule Take 200 mg by mouth at bedtime.   ibuprofen (ADVIL) 800 MG tablet TAKE 1 TABLET(800 MG) BY MOUTH DAILY AS NEEDED FOR MODERATE PAIN   ipratropium (ATROVENT) 0.06 % nasal spray Place 2 sprays into both nostrils 4 (four) times daily.   latanoprost (XALATAN) 0.005 %  ophthalmic solution Place 1 drop into both eyes daily.   levalbuterol (XOPENEX) 1.25 MG/3ML nebulizer solution Take 1.25 mg by nebulization as needed.   levocetirizine (XYZAL) 5 MG tablet TK 1 T PO QD IN THE EVE   losartan (COZAAR) 100 MG tablet TAKE 1 TABLET BY MOUTH EVERY DAY   multivitamin (THERAGRAN) tablet Take 1 tablet by mouth daily.   omeprazole  (PRILOSEC) 40 MG capsule TAKE 1 CAPSULE BY MOUTH EVERY DAY   potassium chloride (KLOR-CON) 8 MEQ tablet Take 1 tablet (8 mEq total) by mouth daily.   prednisoLONE acetate (PRED FORTE) 1 % ophthalmic suspension    sodium chloride (OCEAN) 0.65 % SOLN nasal spray Place 1 spray into both nostrils as needed for congestion.   SUMAtriptan (IMITREX) 100 MG tablet Take 1 tablet (100 mg total) by mouth once. Take 1 tablet at onset of headache   [DISCONTINUED] diltiazem (DILACOR XR) 120 MG 24 hr capsule Take 1 capsule (120 mg total) by mouth daily.   No facility-administered encounter medications on file as of 05/09/2020.    Allergies (verified) Enalapril maleate and Sulfamethoxazole-trimethoprim   History: Past Medical History:  Diagnosis Date   Allergy    rhinitis   Asthma    Bouchard nodes (DJD hand)    Bronchitis    recurrent   Cataract    Depression    DJD (degenerative joint disease) of knee    DJD (degenerative joint disease), lumbosacral    GERD (gastroesophageal reflux disease)    Headache(784.0)    Headache(784.0)    Hyperlipidemia    Hypertension    IBS (irritable bowel syndrome)    Sinusitis, chronic    Past Surgical History:  Procedure Laterality Date   BREAST EXCISIONAL BIOPSY Left 1998   CATARACT EXTRACTION Right    04/21/18   ORIF ANKLE FRACTURE  '07   Left ankle   SEPTOPLASTY  2000   TONSILLECTOMY     transnasal laryngoscopy  2005   TUBAL LIGATION  1977   Family History  Problem Relation Age of Onset   Hypertension Father    Coronary artery disease Father        MI/ Fatal   Breast cancer Neg Hx    Diabetes Neg Hx    Colon cancer Neg Hx    Social History   Socioeconomic History   Marital status: Divorced    Spouse name: Not on file   Number of children: 2   Years of education: 78   Highest education level: Not on file  Occupational History    Employer: [employer]    Comment: retired  Tobacco Use   Smoking status: Never Smoker   Smokeless  tobacco: Never Used  Scientific laboratory technician Use: Never used  Substance and Sexual Activity   Alcohol use: Not Currently   Drug use: Not Currently   Sexual activity: Not Currently  Other Topics Concern   Not on file  Social History Narrative   Environmental consultant -BS, has had additional course work.  married '71- after 29 years husband left, divorce final in '06.  1 son- '74, 1 daughter '76; 2 grandchildren. 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 Lives in her own home, daughter and grandchild home with her. ACP - HCPOA dtr - tiffany (c) [phone] [id number]. CPR - ?, no mechanical ventilation except short term care, no prolonged artificial nutrition.    Social Determinants of Radio broadcast assistant  Strain: Low Risk    Difficulty of Paying Living Expenses: Not very hard  Food Insecurity: No Food Insecurity   Worried About Charity fundraiser in the Last Year: Never true   Ran Out of Food in the Last Year: Never true  Transportation Needs: No Transportation Needs   Lack of Transportation (Medical): No   Lack of Transportation (Non-Medical): No  Physical Activity: Inactive   Days of Exercise per Week: 0 days   Minutes of Exercise per Session: 0 min  Stress: No Stress Concern Present   Feeling of Stress : Not at all  Social Connections: Moderately Integrated   Frequency of Communication with Friends and Family: More than three times a week   Frequency of Social Gatherings with Friends and Family: Once a week   Attends Religious Services: 1 to 4 times per year   Active Member of [employer] or Organizations: No   Attends Music therapist: 1 to 4 times per year   Marital Status: Divorced    Tobacco Counseling Counseling given: Not Answered   Clinical Intake:  Pre-visit preparation completed: Yes  Pain : No/denies pain     Nutritional Risks: None Diabetes: No  How often do you need to have someone help you when you read instructions,  pamphlets, or other written materials from your doctor or pharmacy?: 1 - Never What is the last grade level you completed in school?: Bachelor's Degree from [employer]  Diabetic? no  Interpreter Needed?: No  Information entered by :: [employer]. Jimmylee Ratterree, LPN   Activities of Daily Living In your present state of health, do you have any difficulty performing the following activities: 05/09/2020  Hearing? N  Vision? N  Difficulty concentrating or making decisions? N  Walking or climbing stairs? N  Dressing or bathing? N  Doing errands, shopping? N  Preparing Food and eating ? N  Using the Toilet? N  In the past six months, have you accidently leaked urine? N  Do you have problems with loss of bowel control? N  Managing your Medications? N  Managing your Finances? N  Housekeeping or managing your Housekeeping? N  Some recent data might be hidden    Patient Care Team: Hoyt Koch, MD as PCP - General (Internal Medicine) Mosetta Anis, MD (Allergy) Romine, Lubertha South, MD (Obstetrics and Gynecology) Verner Chol, MD as Consulting Physician (Sports Medicine) Katy Apo, MD as Consulting Physician (Ophthalmology) Charlton Haws, [facility] as Pharmacist (Pharmacist)  Indicate any recent Medical Services you may have received from other than [facility] providers in the past year (date may be approximate).     Assessment:   This is a routine wellness examination for Jarelis.  Hearing/Vision screen No exam data present  Dietary issues and exercise activities discussed: Current Exercise Habits: The patient does not participate in regular exercise at present, Exercise limited by: orthopedic condition(s);respiratory conditions(s)  Goals      Patient Stated     I would like to remove the clutter from my house. I will continue to do what I can to relax and decrease my stress level. I am going to start to go to the [facility].      Patient Stated     Continue  to lose weight and get involved at the senior centers.     Pharmacy Care Plan     CARE PLAN ENTRY (see longitudinal plan of care for additional care plan information)  Current Barriers:  Chronic Disease Management  support, education, and care coordination needs related to Hypertension, Depression, and Anxiety   Hypertension BP Readings from Last 3 Encounters:  04/25/20 (!) 142/80  02/26/20 (!) 176/86  11/24/19 (!) 158/92  Pharmacist Clinical Goal(s): Over the next 180 days, patient will work with PharmD and providers to maintain BP goal <140/90 Current regimen:  Diltiazem 120 mg daily  Losartan 100 mg daily  Furosemide 40 mg  Interventions: Discussed BP goals and benefits of medication for prevention of heart attack / stroke Discussed benefits of taking blood pressure medication at night Patient self care activities - Over the next 180 days, patient will: Check BP 1-2 times weekly, document, and provide at future appointments Ensure daily salt intake < 2300 mg/day  Depression / Anxiety Pharmacist Clinical Goal(s) Over the next 180 days, patient will work with PharmD and providers to optimize therapy Current regimen:  Clonazepam 0.5 mg daily PRN  Escitalopram 10 mg 2 tabs daily Bupropion XL 150 mg daily Interventions: Bupropion added July 2021 and significant improvement noted Patient self care activities - Over the next 180 days, patient will: Take medications as prescribed  Medication management Pharmacist Clinical Goal(s): Over the next 180 days, patient will work with PharmD and providers to maintain optimal medication adherence Current pharmacy: [facility] Interventions Comprehensive medication review performed. Continue current medication management strategy Patient self care activities - Over the next 180 days, patient will: Focus on medication adherence by patient report Take medications as prescribed Report any questions or concerns to PharmD and/or  provider(s)  Please see past updates related to this goal by clicking on the "Past Updates" button in the selected goal      Weight < 200 lb (90.719 kg)     Lose 7 lbs; more movement; having more energy; try water and try tai chi/ Based on pulmonary and may seek guidance from pulmonologist as she feels tried and gets sob       Depression Screen PHQ 2/9 Scores 05/09/2020 05/09/2020 04/27/2019 04/01/2018 03/31/2017 03/28/2016 01/17/2016  PHQ - 2 Score 0 0 $R'2 4 2 'AW$ 0 5  PHQ- 9 Score - - $R'3 7 2 'et$ - 9    Fall Risk Fall Risk  05/09/2020 04/27/2019 04/05/2019 04/01/2018 03/31/2017  Falls in the past year? 1 0 1 1 Yes  Comment - - Emmi Telephone Survey: data to providers prior to load - -  Number falls in past yr: 0 0 1 0 -  Comment - - Emmi Telephone Survey Actual Response = 2 - -  Injury with Fall? 0 0 0 1 No  Risk for fall due to : Other (Comment) Impaired balance/gait;Impaired mobility - - -  Risk for fall due to: Comment running behind cat - - - -  Follow up Falls evaluation completed Falls prevention discussed - Falls prevention discussed -    FALL RISK PREVENTION PERTAINING TO THE HOME:  Any stairs in or around the home? No  If so, are there any without handrails? No  Home free of loose throw rugs in walkways, pet beds, electrical cords, etc? Yes  Adequate lighting in your home to reduce risk of falls? Yes   ASSISTIVE DEVICES UTILIZED TO PREVENT FALLS:  Life alert? No  Use of a cane, walker or w/c? No  Grab bars in the bathroom? No  Shower chair or bench in shower? No  Elevated toilet seat or a handicapped toilet? Yes   TIMED UP AND GO:  Was the test performed? No .  Length of time to  ambulate 10 feet: 0 sec.   Gait steady and fast without use of assistive device  Cognitive Function: MMSE - Mini Mental State Exam 02/27/2015  Not completed: (No Data)        Immunizations Immunization History  Administered Date(s) Administered   Fluad Quad(high Dose 65+) 02/16/2019    Influenza Split 04/05/2011   Influenza Whole 04/22/2007, 02/23/2008, 02/21/2009, 03/06/2009, 02/28/2010, 01/26/2014   Influenza, High Dose Seasonal PF 03/06/2017   Influenza,inj,Quad PF,6+ Mos 01/17/2016   Influenza-Unspecified 02/21/2015, 02/10/2018, 03/13/2020   PFIZER SARS-COV-2 Vaccination 07/28/2019, 08/25/2019, 03/27/2020   Pneumococcal Conjugate-13 03/16/2015   Pneumococcal Polysaccharide-23 05/29/2005, 04/05/2011   Tdap 05/03/2019   Zoster 07/26/2009    TDAP status: Up to date  Flu Vaccine status: Up to date  Pneumococcal vaccine status: Up to date  Covid-19 vaccine status: Completed vaccines  Qualifies for Shingles Vaccine? Yes   Zostavax completed Yes   Shingrix Completed?: No.    Education has been provided regarding the importance of this vaccine. Patient has been advised to call insurance company to determine out of pocket expense if they have not yet received this vaccine. Advised may also receive vaccine at local pharmacy or Health Dept. Verbalized acceptance and understanding.  Screening Tests Health Maintenance  Topic Date Due   MAMMOGRAM  06/14/2021   COLONOSCOPY (Pts 45-41yrs Insurance coverage will need to be confirmed)  12/25/2022   TETANUS/TDAP  05/02/2029   INFLUENZA VACCINE  Completed   DEXA SCAN  Completed   COVID-19 Vaccine  Completed   Hepatitis C Screening  Completed   PNA vac Low Risk Adult  Completed    Health Maintenance  There are no preventive care reminders to display for this patient.  Colorectal cancer screening: Type of screening: Colonoscopy. Completed 12/24/2012. Repeat every 10 years  Mammogram status: Completed 06/15/2019. Repeat every year  Bone Density status: Completed 03/28/2016. Results reflect: Bone density results: NORMAL. Repeat every 5 years.  Lung Cancer Screening: (Low Dose CT Chest recommended if Age 34-80 years, 30 pack-year currently smoking OR have quit w/in 15years.) does not qualify.   Lung Cancer Screening  Referral: no  Additional Screening:  Hepatitis C Screening: does qualify; Completed yes  Vision Screening: Recommended annual ophthalmology exams for early detection of glaucoma and other disorders of the eye. Is the patient up to date with their annual eye exam?  Yes  Who is the provider or what is the name of the office in which the patient attends annual eye exams? Katy Apo, MD. If pt is not established with a provider, would they like to be referred to a provider to establish care? No .   Dental Screening: Recommended annual dental exams for proper oral hygiene  Community Resource Referral / Chronic Care Management: CRR required this visit?  No   CCM required this visit?  No      Plan:     I have personally reviewed and noted the following in the patient's chart:   Medical and social history Use of alcohol, tobacco or illicit drugs  Current medications and supplements Functional ability and status Nutritional status Physical activity Advanced directives List of other physicians Hospitalizations, surgeries, and ER visits in previous 12 months Vitals Screenings to include cognitive, depression, and falls Referrals and appointments  In addition, I have reviewed and discussed with patient certain preventive protocols, quality metrics, and best practice recommendations. A written personalized care plan for preventive services as well as general preventive health recommendations were provided to patient.  Sheral Flow, LPN   74/12/1446   Nurse Notes:  Patient is cogitatively intact. There were no vitals filed for this visit. There is no height or weight on file to calculate BMI. Patient stated that she has no issues with gait or balance; does not use any assistive devices.   Medical screening examination/treatment/procedure(s) were performed by non-physician practitioner and as supervising physician I was immediately available for  consultation/collaboration.  I agree with above. Lew Dawes, MD

## 2020-05-13 ENCOUNTER — Other Ambulatory Visit: Payer: Self-pay | Admitting: Internal Medicine

## 2020-05-17 ENCOUNTER — Other Ambulatory Visit: Payer: Self-pay | Admitting: Internal Medicine

## 2020-05-24 DIAGNOSIS — H401232 Low-tension glaucoma, bilateral, moderate stage: Secondary | ICD-10-CM | POA: Diagnosis not present

## 2020-05-24 DIAGNOSIS — H2000 Unspecified acute and subacute iridocyclitis: Secondary | ICD-10-CM | POA: Diagnosis not present

## 2020-05-24 DIAGNOSIS — H35351 Cystoid macular degeneration, right eye: Secondary | ICD-10-CM | POA: Diagnosis not present

## 2020-05-30 ENCOUNTER — Encounter: Payer: PPO | Admitting: Internal Medicine

## 2020-06-04 ENCOUNTER — Other Ambulatory Visit: Payer: Self-pay | Admitting: Internal Medicine

## 2020-06-14 ENCOUNTER — Other Ambulatory Visit: Payer: Self-pay | Admitting: Internal Medicine

## 2020-06-15 NOTE — Telephone Encounter (Signed)
Patient will be out of medication today.

## 2020-06-19 ENCOUNTER — Ambulatory Visit (INDEPENDENT_AMBULATORY_CARE_PROVIDER_SITE_OTHER): Payer: PPO | Admitting: Internal Medicine

## 2020-06-19 ENCOUNTER — Encounter: Payer: Self-pay | Admitting: Internal Medicine

## 2020-06-19 ENCOUNTER — Other Ambulatory Visit: Payer: Self-pay

## 2020-06-19 VITALS — BP 134/84 | HR 85 | Temp 98.8°F | Resp 18 | Ht 66.0 in | Wt 189.6 lb

## 2020-06-19 DIAGNOSIS — K589 Irritable bowel syndrome without diarrhea: Secondary | ICD-10-CM | POA: Diagnosis not present

## 2020-06-19 DIAGNOSIS — J4520 Mild intermittent asthma, uncomplicated: Secondary | ICD-10-CM | POA: Diagnosis not present

## 2020-06-19 DIAGNOSIS — Z23 Encounter for immunization: Secondary | ICD-10-CM

## 2020-06-19 DIAGNOSIS — Z0000 Encounter for general adult medical examination without abnormal findings: Secondary | ICD-10-CM

## 2020-06-19 DIAGNOSIS — F411 Generalized anxiety disorder: Secondary | ICD-10-CM

## 2020-06-19 DIAGNOSIS — I10 Essential (primary) hypertension: Secondary | ICD-10-CM

## 2020-06-19 LAB — LIPID PANEL
Cholesterol: 175 mg/dL (ref 0–200)
HDL: 51 mg/dL (ref >39.00)
LDL Cholesterol: 110 mg/dL — ABNORMAL HIGH (ref 0–99)
NonHDL: 124.05
Total CHOL/HDL Ratio: 3
Triglycerides: 68 mg/dL (ref 0.0–149.0)
VLDL: 13.6 mg/dL (ref 0.0–40.0)

## 2020-06-19 LAB — COMPREHENSIVE METABOLIC PANEL
ALT: 9 U/L (ref 0–35)
AST: 17 U/L (ref 0–37)
Albumin: 3.8 g/dL (ref 3.5–5.2)
Alkaline Phosphatase: 74 U/L (ref 39–117)
BUN: 11 mg/dL (ref 6–23)
CO2: 27 mEq/L (ref 19–32)
Calcium: 9.1 mg/dL (ref 8.4–10.5)
Chloride: 104 mEq/L (ref 96–112)
Creatinine, Ser: 1.17 mg/dL (ref 0.40–1.20)
GFR: 45.97 mL/min — ABNORMAL LOW (ref >60.00)
Glucose, Bld: 90 mg/dL (ref 70–99)
Potassium: 3.8 mEq/L (ref 3.5–5.1)
Sodium: 137 mEq/L (ref 135–145)
Total Bilirubin: 0.4 mg/dL (ref 0.2–1.2)
Total Protein: 7 g/dL (ref 6.0–8.3)

## 2020-06-19 LAB — CBC
HCT: 39.6 % (ref 36.0–46.0)
Hemoglobin: 12.3 g/dL (ref 12.0–15.0)
MCHC: 31.1 g/dL (ref 30.0–36.0)
MCV: 74.5 fl — ABNORMAL LOW (ref 78.0–100.0)
Platelets: 197 10*3/uL (ref 150.0–400.0)
RBC: 5.31 Mil/uL — ABNORMAL HIGH (ref 3.87–5.11)
RDW: 15.8 % — ABNORMAL HIGH (ref 11.5–15.5)
WBC: 4.2 10*3/uL (ref 4.0–10.5)

## 2020-06-19 MED ORDER — CLONAZEPAM 0.5 MG PO TABS: 0.5000 mg | ORAL_TABLET | Freq: Every day | ORAL | 5 refills | Status: DC | PRN

## 2020-06-19 MED ORDER — ESCITALOPRAM OXALATE 10 MG PO TABS: ORAL_TABLET | ORAL | 3 refills | Status: DC

## 2020-06-19 MED ORDER — BUPROPION HCL ER (XL) 150 MG PO TB24: 150.0000 mg | ORAL_TABLET | Freq: Every day | ORAL | 3 refills | Status: DC

## 2020-06-19 NOTE — Progress Notes (Signed)
   Subjective:   Patient ID: Dominique Johnston, female    DOB: 1945-06-19, 75 y.o.   MRN: [id number]  HPI The patient is a 75 YO female coming in for physical.   PMH, FMH, social history reviewed and updated  Review of Systems  Constitutional: Negative.   HENT: Negative.   Eyes: Negative.   Respiratory: Negative for cough, chest tightness and shortness of breath.   Cardiovascular: Negative for chest pain, palpitations and leg swelling.  Gastrointestinal: Negative for abdominal distention, abdominal pain, constipation, diarrhea, nausea and vomiting.  Musculoskeletal: Negative.   Skin: Negative.   Neurological: Negative.   Psychiatric/Behavioral: Negative.     Objective:  Physical Exam Constitutional:      Appearance: She is well-developed and well-nourished.  HENT:     Head: Normocephalic and atraumatic.  Eyes:     Extraocular Movements: EOM normal.  Cardiovascular:     Rate and Rhythm: Normal rate and regular rhythm.  Pulmonary:     Effort: Pulmonary effort is normal. No respiratory distress.     Breath sounds: Wheezing present. No rales.     Comments: Left lung wheezing Abdominal:     General: Bowel sounds are normal. There is no distension.     Palpations: Abdomen is soft.     Tenderness: There is no abdominal tenderness. There is no rebound.  Musculoskeletal:        General: No edema.     Cervical back: Normal range of motion.  Skin:    General: Skin is warm and dry.  Neurological:     Mental Status: She is alert and oriented to person, place, and time.     Coordination: Coordination normal.  Psychiatric:        Mood and Affect: Mood and affect normal.     Vitals:   06/19/20 1040  BP: 134/84  Pulse: 85  Resp: 18  Temp: 98.8 F (37.1 C)  TempSrc: Oral  SpO2: 99%  Weight: 189 lb 9.6 oz (86 kg)  Height: 5\' 6"  (1.676 m)    This visit occurred during the SARS-CoV-2 public health emergency.  Safety protocols were in place, including screening  questions prior to the visit, additional usage of staff PPE, and extensive cleaning of exam room while observing appropriate contact time as indicated for disinfecting solutions.   Assessment & Plan:  Pneumonia 23 given at visit

## 2020-06-19 NOTE — Assessment & Plan Note (Signed)
Flu shot up to date. Covid-19 up to date including booster. Pneumonia given 23 today and due every 5 years, 13 complete. Shingrix counseled to check with pharmacy about coverage. Tetanus due 2030. Colonoscopy due 2024. Mammogram due 2023, pap smear aged out and dexa has had further not needed. Counseled about sun safety and mole surveillance. Counseled about the dangers of distracted driving. Given 10 year screening recommendations.

## 2020-06-19 NOTE — Assessment & Plan Note (Signed)
Uses bentyl and dietary modification to help.

## 2020-06-19 NOTE — Assessment & Plan Note (Signed)
Keep lexapro and wellbutrin and clonazepam prn.

## 2020-06-19 NOTE — Patient Instructions (Signed)
We will check the labs.   Think about getting shingles vaccine at the pharmacy.   Health Maintenance, Female Adopting a healthy lifestyle and getting preventive care are important in promoting health and wellness. Ask your health care provider about:  The right schedule for you to have regular tests and exams.  Things you can do on your own to prevent diseases and keep yourself healthy. What should I know about diet, weight, and exercise? Eat a healthy diet  Eat a diet that includes plenty of vegetables, fruits, low-fat dairy products, and lean protein.  Do not eat a lot of foods that are high in solid fats, added sugars, or sodium.   Maintain a healthy weight Body mass index (BMI) is used to identify weight problems. It estimates body fat based on height and weight. Your health care provider can help determine your BMI and help you achieve or maintain a healthy weight. Get regular exercise Get regular exercise. This is one of the most important things you can do for your health. Most adults should:  Exercise for at least 150 minutes each week. The exercise should increase your heart rate and make you sweat (moderate-intensity exercise).  Do strengthening exercises at least twice a week. This is in addition to the moderate-intensity exercise.  Spend less time sitting. Even light physical activity can be beneficial. Watch cholesterol and blood lipids Have your blood tested for lipids and cholesterol at 75 years of age, then have this test every 5 years. Have your cholesterol levels checked more often if:  Your lipid or cholesterol levels are high.  You are older than 75 years of age.  You are at high risk for heart disease. What should I know about cancer screening? Depending on your health history and family history, you may need to have cancer screening at various ages. This may include screening for:  Breast cancer.  Cervical cancer.  Colorectal cancer.  Skin  cancer.  Lung cancer. What should I know about heart disease, diabetes, and high blood pressure? Blood pressure and heart disease  High blood pressure causes heart disease and increases the risk of stroke. This is more likely to develop in people who have high blood pressure readings, are of African descent, or are overweight.  Have your blood pressure checked: ? Every 3-5 years if you are 59-19 years of age. ? Every year if you are 73 years old or older. Diabetes Have regular diabetes screenings. This checks your fasting blood sugar level. Have the screening done:  Once every three years after age 61 if you are at a normal weight and have a low risk for diabetes.  More often and at a younger age if you are overweight or have a high risk for diabetes. What should I know about preventing infection? Hepatitis B If you have a higher risk for hepatitis B, you should be screened for this virus. Talk with your health care provider to find out if you are at risk for hepatitis B infection. Hepatitis C Testing is recommended for:  Everyone born from [birth year] through 1965.  Anyone with known risk factors for hepatitis C. Sexually transmitted infections (STIs)  Get screened for STIs, including gonorrhea and chlamydia, if: ? You are sexually active and are younger than 75 years of age. ? You are older than 75 years of age and your health care provider tells you that you are at risk for this type of infection. ? Your sexual activity has changed since you were  last screened, and you are at increased risk for chlamydia or gonorrhea. Ask your health care provider if you are at risk.  Ask your health care provider about whether you are at high risk for HIV. Your health care provider may recommend a prescription medicine to help prevent HIV infection. If you choose to take medicine to prevent HIV, you should first get tested for HIV. You should then be tested every 3 months for as long as you are taking  the medicine. Pregnancy  If you are about to stop having your period (premenopausal) and you may become pregnant, seek counseling before you get pregnant.  Take 400 to 800 micrograms (mcg) of folic acid every day if you become pregnant.  Ask for birth control (contraception) if you want to prevent pregnancy. Osteoporosis and menopause Osteoporosis is a disease in which the bones lose minerals and strength with aging. This can result in bone fractures. If you are 83 years old or older, or if you are at risk for osteoporosis and fractures, ask your health care provider if you should:  Be screened for bone loss.  Take a calcium or vitamin D supplement to lower your risk of fractures.  Be given hormone replacement therapy (HRT) to treat symptoms of menopause. Follow these instructions at home: Lifestyle  Do not use any products that contain nicotine or tobacco, such as cigarettes, e-cigarettes, and chewing tobacco. If you need help quitting, ask your health care provider.  Do not use street drugs.  Do not share needles.  Ask your health care provider for help if you need support or information about quitting drugs. Alcohol use  Do not drink alcohol if: ? Your health care provider tells you not to drink. ? You are pregnant, may be pregnant, or are planning to become pregnant.  If you drink alcohol: ? Limit how much you use to 0-1 drink a day. ? Limit intake if you are breastfeeding.  Be aware of how much alcohol is in your drink. In the [place] one drink equals one 12 oz bottle of beer (355 mL), one 5 oz glass of wine (148 mL), or one 1 oz glass of hard liquor (44 mL). General instructions  Schedule regular health, dental, and eye exams.  Stay current with your vaccines.  Tell your health care provider if: ? You often feel depressed. ? You have ever been abused or do not feel safe at home. Summary  Adopting a healthy lifestyle and getting preventive care are important in  promoting health and wellness.  Follow your health care provider's instructions about healthy diet, exercising, and getting tested or screened for diseases.  Follow your health care provider's instructions on monitoring your cholesterol and blood pressure. This information is not intended to replace advice given to you by your health care provider. Make sure you discuss any questions you have with your health care provider. Document Revised: 04/22/2018 Document Reviewed: 04/22/2018 Elsevier Patient Education  2021 [employer].

## 2020-06-19 NOTE — Assessment & Plan Note (Signed)
Stable with minimal wheezing on exam. Xopenex/albuterol prn and allergy medications.

## 2020-06-19 NOTE — Assessment & Plan Note (Signed)
BP at goal on diltiazem and losartan and lasix. Checking CMP and adjust as needed.

## 2020-06-28 DIAGNOSIS — H401232 Low-tension glaucoma, bilateral, moderate stage: Secondary | ICD-10-CM | POA: Diagnosis not present

## 2020-06-28 DIAGNOSIS — H35351 Cystoid macular degeneration, right eye: Secondary | ICD-10-CM | POA: Diagnosis not present

## 2020-07-12 ENCOUNTER — Other Ambulatory Visit: Payer: Self-pay | Admitting: Internal Medicine

## 2020-07-22 ENCOUNTER — Ambulatory Visit (HOSPITAL_COMMUNITY)
Admission: EM | Admit: 2020-07-22 | Discharge: 2020-07-22 | Disposition: A | Discharge status: Home / Self Care | Payer: PPO | Attending: Student | Admitting: Student

## 2020-07-22 ENCOUNTER — Encounter (HOSPITAL_COMMUNITY): Payer: Self-pay

## 2020-07-22 ENCOUNTER — Other Ambulatory Visit: Payer: Self-pay

## 2020-07-22 DIAGNOSIS — J209 Acute bronchitis, unspecified: Secondary | ICD-10-CM

## 2020-07-22 DIAGNOSIS — J4521 Mild intermittent asthma with (acute) exacerbation: Secondary | ICD-10-CM

## 2020-07-22 DIAGNOSIS — Z9889 Other specified postprocedural states: Secondary | ICD-10-CM | POA: Diagnosis not present

## 2020-07-22 DIAGNOSIS — I10 Essential (primary) hypertension: Secondary | ICD-10-CM | POA: Diagnosis not present

## 2020-07-22 DIAGNOSIS — J301 Allergic rhinitis due to pollen: Secondary | ICD-10-CM

## 2020-07-22 MED ORDER — AZITHROMYCIN 250 MG PO TABS
250.0000 mg | ORAL_TABLET | Freq: Every day | ORAL | 0 refills | Status: DC
Start: 2020-07-22 — End: 2020-10-11

## 2020-07-22 MED ORDER — PREDNISONE 10 MG (21) PO TBPK
ORAL_TABLET | Freq: Every day | ORAL | 0 refills | Status: DC
Start: 2020-07-22 — End: 2020-10-11

## 2020-07-22 MED ORDER — FLUTICASONE PROPIONATE 50 MCG/ACT NA SUSP: 1.0000 | Freq: Every day | NASAL | 2 refills | Status: AC

## 2020-07-22 NOTE — Discharge Instructions (Addendum)
-  Start the prednisone taper.  Follow instructions on the package.  Try taking this earlier in the day as it can give you energy.  This will help the inflammation in your lungs calm down, which is what is causing your frequent coughing. -Also start the Z-Pak.  Take 2 pills on day 1.  Take 1 pill/day on days 2 through 5. -For asthma- continue albuterol inhaler, breo ellipta inhaler -For allergic rhinitis, continue flonase, Xyzal. Flonase refilled today. -Continue your antihypertensives as directed -Seek additional medical treatment if you develop shortness of breath despite treatment, chest pain, new  fever/chills, or other new symptoms that concern you.

## 2020-07-22 NOTE — ED Provider Notes (Signed)
[facility]    CSN: [id number] Arrival date & time: 07/22/20  1517      History   Chief Complaint Chief Complaint  Patient presents with  . Cough    HPI Serrena Linderman is a 75 y.o. female presenting with bronchitis.  History of asthma controlled on albuterol and breo ellipta, recurrent bronchitis, GAD, hypertension, osteoarthritis, hyperlipidemia, sinus surgery, allergic rhinitis. Notes 2 weeks of hacking productive cough, somewhat improved with inhalers. Allergic rhinitis moderately well controlled on Xyzal but she is out of flonase; endorses nasal congestion but denies facial pressure. History deviated septum/surgery due to this. Denies fevers/chills, n/v/d, shortness of breath, chest pain,  facial pain, teeth pain, headaches, sore throat, loss of taste/smell, swollen lymph nodes, ear pain. Denies shortness of breath, fevers, chest pain, dizziness, headaches. Denies history diabetes. Feeling well otherwise. Hasn't taken antihypertensives yet today.  HPI  Past Medical History:  Diagnosis Date  . Allergy    rhinitis  . Asthma   . Bouchard nodes (DJD hand)   . Bronchitis    recurrent  . Cataract   . Depression   . DJD (degenerative joint disease) of knee   . DJD (degenerative joint disease), lumbosacral   . GERD (gastroesophageal reflux disease)   . Headache(784.0)   . Headache(784.0)   . Hyperlipidemia   . Hypertension   . IBS (irritable bowel syndrome)   . Sinusitis, chronic     Patient Active Problem List   Diagnosis Date Noted  . Left hip pain 03/22/2019  . Mild intermittent asthma 10/08/2016  . Routine health maintenance 10/25/2012  . Borderline high cholesterol 01/19/2010  . G E REFLUX 12/10/2007  . Generalized anxiety disorder 12/04/2007  . Essential hypertension 12/01/2006  . Allergic rhinitis 12/01/2006  . IRRITABLE BOWEL SYNDROME 12/01/2006  . Osteoarthritis 12/01/2006    Past Surgical History:  Procedure Laterality Date  . BREAST  EXCISIONAL BIOPSY Left 1998  . CATARACT EXTRACTION Right    04/21/18  . ORIF ANKLE FRACTURE  '07   Left ankle  . SEPTOPLASTY  2000  . TONSILLECTOMY    . transnasal laryngoscopy  2005  . TUBAL LIGATION  1977    OB History   No obstetric history on file.      Home Medications    Prior to Admission medications   Medication Sig Start Date End Date Taking? Authorizing Provider  azithromycin (ZITHROMAX Z-PAK) 250 MG tablet Take 1 tablet (250 mg total) by mouth daily. Z-pack: Take two pills on day 1. Take one pill per day on days 2-5. 07/22/20  Yes Rhys Martini, PA-C  fluticasone [facility]) 50 MCG/ACT nasal spray Place 1 spray into both nostrils daily. 07/22/20  Yes Rhys Martini, PA-C  predniSONE (STERAPRED UNI-PAK 21 TAB) 10 MG (21) TBPK tablet Take by mouth daily. Take 6 tabs by mouth daily  for 2 days, then 5 tabs for 2 days, then 4 tabs for 2 days, then 3 tabs for 2 days, 2 tabs for 2 days, then 1 tab by mouth daily for 2 days 07/22/20  Yes Cheree Ditto, Lyman Speller, PA-C  albuterol (PROVENTIL HFA;VENTOLIN HFA) 108 (90 Base) MCG/ACT inhaler Inhale 2 puffs into the lungs every 6 (six) hours as needed. 03/28/16   [name], MD  Ascorbic Acid (VITAMIN C PO) Take 1 tablet by mouth daily.    [provider]  Earlie Server 539-76 MCG/INH AEPB INHALE 1 PUFF PO QD 02/27/16   [provider]  buPROPion (WELLBUTRIN XL)  150 MG 24 hr tablet Take 1 tablet (150 mg total) by mouth daily. 06/19/20   Myrlene Brokerrawford, Elizabeth A, MD  clonazePAM (KLONOPIN) 0.5 MG tablet Take 1 tablet (0.5 mg total) by mouth daily as needed. for anxiety 06/19/20   Myrlene Brokerrawford, Elizabeth A, MD  Cyanocobalamin (B-12 PO) Take by mouth daily.    [provider]  diclofenac Sodium (VOLTAREN) 1 % GEL Apply 4 g topically 4 (four) times daily. 02/26/20   Georgetta HaberBurky, Natalie B, NP  dicyclomine (BENTYL) 20 MG tablet TAKE 1/2 TABLET BY MOUTH THREE TIMES DAILY BEFORE MEALS 05/15/20   Myrlene Brokerrawford, Elizabeth A, MD  diltiazem  [facility](TIAZAC) 120 MG 24 hr capsule TAKE 1 CAPSULE BY MOUTH DAILY 05/15/20   Myrlene Brokerrawford, Elizabeth A, MD  dorzolamide (TRUSOPT) 2 % ophthalmic solution Place into both eyes 2 (two) times daily.  01/13/14   [provider]  escitalopram (LEXAPRO) 10 MG tablet TAKE 2 TABLETS(20 MG) BY MOUTH DAILY 06/19/20   Myrlene Brokerrawford, Elizabeth A, MD  furosemide (LASIX) 40 MG tablet TAKE 1 TABLET(40 MG) BY MOUTH DAILY 10/26/19   Myrlene Brokerrawford, Elizabeth A, MD  gabapentin (NEURONTIN) 100 MG capsule Take 200 mg by mouth at bedtime. 10/19/19   Melina FiddlerBassett, Rebecca S, MD  ibuprofen (ADVIL) 800 MG tablet TAKE 1 TABLET(800 MG) BY MOUTH DAILY AS NEEDED FOR MODERATE PAIN 01/18/20   Myrlene Brokerrawford, Elizabeth A, MD  ipratropium (ATROVENT) 0.06 % nasal spray Place 2 sprays into both nostrils 4 (four) times daily. 05/17/18   Cathie HoopsYu, Amy V, PA-C  latanoprost (XALATAN) 0.005 % ophthalmic solution Place 1 drop into both eyes daily. 10/22/12   Norins, Rosalyn GessMichael E, MD  levalbuterol Pauline Aus(XOPENEX) 1.25 MG/3ML nebulizer solution Take 1.25 mg by nebulization as needed. 10/22/12   Norins, Rosalyn GessMichael E, MD  levocetirizine (XYZAL) 5 MG tablet TK 1 T PO QD IN THE EVE 02/23/18   [provider]  losartan (COZAAR) 100 MG tablet TAKE 1 TABLET BY MOUTH EVERY DAY 07/14/20   Myrlene Brokerrawford, Elizabeth A, MD  multivitamin [facility](THERAGRAN) tablet Take 1 tablet by mouth daily.    [provider]  omeprazole (PRILOSEC) 40 MG capsule TAKE 1 CAPSULE BY MOUTH EVERY DAY 04/25/20   Myrlene Brokerrawford, Elizabeth A, MD  potassium chloride (KLOR-CON) 8 MEQ tablet TAKE 1 TABLET(8 MEQ) BY MOUTH DAILY 06/06/20   Myrlene Brokerrawford, Elizabeth A, MD  prednisoLONE acetate (PRED FORTE) 1 % ophthalmic suspension  03/29/20   [provider]  sodium chloride (OCEAN) 0.65 % SOLN nasal spray Place 1 spray into both nostrils as needed for congestion. 09/21/19   Darr, Gerilyn PilgrimJacob, PA-C  SUMAtriptan (IMITREX) 100 MG tablet Take 1 tablet (100 mg total) by mouth once. Take 1 tablet at onset of headache 03/08/16 03/28/16  Myrlene Brokerrawford,  Elizabeth A, MD  diltiazem (DILACOR XR) 120 MG 24 hr capsule Take 1 capsule (120 mg total) by mouth daily. 10/22/11 10/11/12  Norins, Rosalyn GessMichael E, MD    Family History Family History  Problem Relation Age of Onset  . Hypertension Father   . Coronary artery disease Father        MI/ Fatal  . Breast cancer Neg Hx   . Diabetes Neg Hx   . Colon cancer Neg Hx     Social History Social History   Tobacco Use  . Smoking status: Never Smoker  . Smokeless tobacco: Never Used  Vaping Use  . Vaping Use: Never used  Substance Use Topics  . Alcohol use: Not Currently  . Drug use: Not Currently     Allergies  Enalapril maleate and Sulfamethoxazole-trimethoprim   Review of Systems Review of Systems  Constitutional: Negative for appetite change, chills and fever.  HENT: Positive for congestion. Negative for ear pain, rhinorrhea, sinus pressure, sinus pain and sore throat.   Eyes: Negative for redness and visual disturbance.  Respiratory: Positive for cough and wheezing. Negative for chest tightness and shortness of breath.   Cardiovascular: Negative for chest pain and palpitations.  Gastrointestinal: Negative for abdominal pain, constipation, diarrhea, nausea and vomiting.  Genitourinary: Negative for dysuria, frequency and urgency.  Musculoskeletal: Negative for myalgias.  Neurological: Negative for dizziness, weakness and headaches.  Psychiatric/Behavioral: Negative for confusion.  All other systems reviewed and are negative.    Physical Exam Triage Vital Signs ED Triage Vitals  Enc Vitals Group     BP      Pulse      Resp      Temp      Temp src      SpO2      Weight      Height      Head Circumference      Peak Flow      Pain Score      Pain Loc      Pain Edu?      Excl. in GC?    No data found.  Updated Vital Signs BP (!) 173/63 (BP Location: Right Arm)   Pulse 63   Temp 98.9 F (37.2 C) (Oral)   Resp 18   SpO2 97%   Visual Acuity Right Eye Distance:    Left Eye Distance:   Bilateral Distance:    Right Eye Near:   Left Eye Near:    Bilateral Near:     Physical Exam Vitals reviewed.  Constitutional:      General: She is not in acute distress.    Appearance: Normal appearance. She is not ill-appearing.  HENT:     Head: Normocephalic and atraumatic.     Right Ear: Hearing, tympanic membrane, ear canal and external ear normal. No swelling or tenderness. There is no impacted cerumen. No mastoid tenderness. Tympanic membrane is not perforated, erythematous, retracted or bulging.     Left Ear: Hearing, tympanic membrane, ear canal and external ear normal. No swelling or tenderness. There is no impacted cerumen. No mastoid tenderness. Tympanic membrane is not perforated, erythematous, retracted or bulging.     Nose:     Right Sinus: No maxillary sinus tenderness or frontal sinus tenderness.     Left Sinus: No maxillary sinus tenderness or frontal sinus tenderness.     Mouth/Throat:     Mouth: Mucous membranes are moist.     Pharynx: Uvula midline. No oropharyngeal exudate or posterior oropharyngeal erythema.     Tonsils: No tonsillar exudate.  Eyes:     Extraocular Movements: Extraocular movements intact.     Pupils: Pupils are equal, round, and reactive to light.  Cardiovascular:     Rate and Rhythm: Normal rate and regular rhythm.     Heart sounds: Normal heart sounds.  Pulmonary:     Breath sounds: Normal air entry. Wheezing present. No decreased breath sounds, rhonchi or rales.     Comments: Wheezes throughout. Frequent hacking cough. Chest:     Chest wall: No tenderness.  Abdominal:     General: Abdomen is flat. Bowel sounds are normal.     Tenderness: There is no abdominal tenderness. There is no guarding or rebound.  Lymphadenopathy:     Cervical: No cervical  adenopathy.  Neurological:     General: No focal deficit present.     Mental Status: She is alert and oriented to person, place, and time.  Psychiatric:         Attention and Perception: Attention and perception normal.        Mood and Affect: Mood and affect normal.        Behavior: Behavior normal. Behavior is cooperative.        Thought Content: Thought content normal.        Judgment: Judgment normal.      UC Treatments / Results  Labs (all labs ordered are listed, but only abnormal results are displayed) Labs Reviewed - No data to display  EKG   Radiology No results found.  Procedures Procedures (including critical care time)  Medications Ordered in UC Medications - No data to display  Initial Impression / Assessment and Plan / UC Course  I have reviewed the triage vital signs and the nursing notes.  Pertinent labs & imaging results that were available during my care of the patient were reviewed by me and considered in my medical decision making (see chart for details).     This patient is a 75 year old female presenting with acute bronchitis and asthma exacerbation.  Prednisone taper as below.  She is not a diabetic.  Continue Breo Ellipta and albuterol inhalers.  Patient with history of sinus surgery presenting with nasal congestion for 2 weeks; Z-Pak as below.  Has not taken her antihypertensives yet today.  Continue these as directed.  For allergic rhinitis, continue Xyzal.  Flonase refilled.  Return precautions discussed.  This chart was dictated using voice recognition software, Dragon. Despite the best efforts of this provider to proofread and correct errors, errors may still occur which can change documentation meaning.   Final Clinical Impressions(s) / UC Diagnoses   Final diagnoses:  Acute bronchitis, unspecified organism  Mild intermittent asthma with acute exacerbation  Seasonal allergic rhinitis due to pollen  Essential hypertension  History of sinus surgery     Discharge Instructions     -Start the prednisone taper.  Follow instructions on the package.  Try taking this earlier in the day as it  can give you energy.  This will help the inflammation in your lungs calm down, which is what is causing your frequent coughing. -Also start the Z-Pak.  Take 2 pills on day 1.  Take 1 pill/day on days 2 through 5. -For asthma- continue albuterol inhaler, breo ellipta inhaler -For allergic rhinitis, continue flonase, Xyzal. Flonase refilled today. -Continue your antihypertensives as directed -Seek additional medical treatment if you develop shortness of breath despite treatment, chest pain, new  fever/chills, or other new symptoms that concern you.    ED Prescriptions    Medication Sig Dispense Auth. Provider   predniSONE (STERAPRED UNI-PAK 21 TAB) 10 MG (21) TBPK tablet Take by mouth daily. Take 6 tabs by mouth daily  for 2 days, then 5 tabs for 2 days, then 4 tabs for 2 days, then 3 tabs for 2 days, 2 tabs for 2 days, then 1 tab by mouth daily for 2 days 42 tablet Ignacia Bayley E, PA-C   azithromycin (ZITHROMAX Z-PAK) 250 MG tablet Take 1 tablet (250 mg total) by mouth daily. Z-pack: Take two pills on day 1. Take one pill per day on days 2-5. 6 tablet Rhys Martini, PA-C   fluticasone [facility]) 50 MCG/ACT nasal spray Place 1 spray into both nostrils daily. 15  mL Rhys Martini, PA-C     PDMP not reviewed this encounter.   Rhys Martini, PA-C 07/22/20 1624

## 2020-07-22 NOTE — ED Triage Notes (Signed)
Pt present cough with nasal drainage. Symptoms started two weeks ago but this week the cough has gotten worst. Pt tried OTC medication with no relief.

## 2020-07-25 ENCOUNTER — Other Ambulatory Visit: Payer: Self-pay | Admitting: Internal Medicine

## 2020-07-31 DIAGNOSIS — H35351 Cystoid macular degeneration, right eye: Secondary | ICD-10-CM | POA: Diagnosis not present

## 2020-08-25 ENCOUNTER — Telehealth: Payer: Self-pay | Admitting: Pharmacist

## 2020-08-25 NOTE — Chronic Care Management (AMB) (Signed)
Chronic Care Management Pharmacy Assistant   Name: Dominique Johnston  MRN: [id number] DOB: Jun 12, 1945   Reason for Encounter: Hypertension Disease State Call   Conditions to be addressed/monitored: HTN   Recent office visits:  06/19/20 Dominique Johnston, no medications changes  Recent consult visits:  None ID  Hospital visits:  Medication Reconciliation was completed by comparing discharge summary, patient's EMR and Pharmacy list, and upon discussion with patient.  Admitted to the hospital on 07/22/20 due to persistent cough. Discharge date was 07/22/20. Discharged from [facility].    New?Medications Started at [facility] Discharge:?? -started prednisone due to inflammation, but patient stated that she did not start it because she was already taking eye drop -Started Z-Pak  Medication Changes at Hospital Discharge: -Changed None ID  Medications Discontinued at Hospital Discharge: -Stopped None ID  Medications that remain the same after Hospital Discharge:??  -All other medications will remain the same.    Medications: Outpatient Encounter Medications as of 08/25/2020  Medication Sig Note   albuterol (PROVENTIL HFA;VENTOLIN HFA) 108 (90 Base) MCG/ACT inhaler Inhale 2 puffs into the lungs every 6 (six) hours as needed.    Ascorbic Acid (VITAMIN C PO) Take 1 tablet by mouth daily.    azithromycin (ZITHROMAX Z-PAK) 250 MG tablet Take 1 tablet (250 mg total) by mouth daily. Z-pack: Take two pills on day 1. Take one pill per day on days 2-5.    BREO ELLIPTA 200-25 MCG/INH AEPB INHALE 1 PUFF PO QD 03/14/2016: Received from: External Pharmacy   buPROPion (WELLBUTRIN XL) 150 MG 24 hr tablet Take 1 tablet (150 mg total) by mouth daily.    clonazePAM (KLONOPIN) 0.5 MG tablet Take 1 tablet (0.5 mg total) by mouth daily as needed. for anxiety    Cyanocobalamin (B-12 PO) Take by mouth daily.    diclofenac Sodium (VOLTAREN) 1 % GEL Apply 4 g topically 4 (four) times  daily.    dicyclomine (BENTYL) 20 MG tablet TAKE 1/2 TABLET BY MOUTH THREE TIMES DAILY BEFORE MEALS    diltiazem (TIAZAC) 120 MG 24 hr capsule TAKE 1 CAPSULE BY MOUTH DAILY    dorzolamide (TRUSOPT) 2 % ophthalmic solution Place into both eyes 2 (two) times daily.  03/02/2014: Received from: External Pharmacy   escitalopram (LEXAPRO) 10 MG tablet TAKE 2 TABLETS(20 MG) BY MOUTH DAILY    fluticasone (FLONASE) 50 MCG/ACT nasal spray Place 1 spray into both nostrils daily.    furosemide (LASIX) 40 MG tablet TAKE 1 TABLET(40 MG) BY MOUTH DAILY    gabapentin (NEURONTIN) 100 MG capsule Take 200 mg by mouth at bedtime.    ibuprofen (ADVIL) 800 MG tablet TAKE 1 TABLET(800 MG) BY MOUTH DAILY AS NEEDED FOR MODERATE PAIN    ipratropium (ATROVENT) 0.06 % nasal spray Place 2 sprays into both nostrils 4 (four) times daily.    latanoprost (XALATAN) 0.005 % ophthalmic solution Place 1 drop into both eyes daily.    levalbuterol (XOPENEX) 1.25 MG/3ML nebulizer solution Take 1.25 mg by nebulization as needed. 02/27/2015: Takes as needed    levocetirizine (XYZAL) 5 MG tablet TK 1 T PO QD IN THE EVE    losartan (COZAAR) 100 MG tablet TAKE 1 TABLET BY MOUTH EVERY DAY    multivitamin (THERAGRAN) tablet Take 1 tablet by mouth daily.    omeprazole (PRILOSEC) 40 MG capsule TAKE 1 CAPSULE BY MOUTH EVERY DAY    potassium chloride (KLOR-CON) 8 MEQ tablet TAKE 1 TABLET(8 MEQ) BY MOUTH DAILY  prednisoLONE acetate (PRED FORTE) 1 % ophthalmic suspension     predniSONE (STERAPRED UNI-PAK 21 TAB) 10 MG (21) TBPK tablet Take by mouth daily. Take 6 tabs by mouth daily  for 2 days, then 5 tabs for 2 days, then 4 tabs for 2 days, then 3 tabs for 2 days, 2 tabs for 2 days, then 1 tab by mouth daily for 2 days    sodium chloride (OCEAN) 0.65 % SOLN nasal spray Place 1 spray into both nostrils as needed for congestion.    SUMAtriptan (IMITREX) 100 MG tablet Take 1 tablet (100 mg total) by mouth once. Take 1 tablet at onset of headache     [DISCONTINUED] diltiazem (DILACOR XR) 120 MG 24 hr capsule Take 1 capsule (120 mg total) by mouth daily.    No facility-administered encounter medications on file as of 08/25/2020.    Reviewed chart prior to disease state call. Spoke with patient regarding BP  Recent Office Vitals: BP Readings from Last 3 Encounters:  07/22/20 (!) 173/63  06/19/20 134/84  04/25/20 (!) 142/80   Pulse Readings from Last 3 Encounters:  07/22/20 63  06/19/20 85  04/25/20 66    Wt Readings from Last 3 Encounters:  06/19/20 189 lb 9.6 oz (86 kg)  04/25/20 198 lb (89.8 kg)  11/24/19 205 lb (93 kg)     Kidney Function Lab Results  Component Value Date/Time   CREATININE 1.17 06/19/2020 11:22 AM   CREATININE 1.01 03/22/2019 11:16 AM   GFR 45.97 (L) 06/19/2020 11:22 AM   GFRNONAA 64.40 07/19/2009 09:42 AM   GFRAA 65 07/18/2008 12:27 PM    BMP Latest Ref Rng & Units 06/19/2020 03/22/2019 04/01/2018  Glucose 70 - 99 mg/dL 90 96 416(S)  BUN 6 - 23 mg/dL 11 15 13   Creatinine 0.40 - 1.20 mg/dL 0.63 0.16  Sodium 135 - 145 mEq/L 137 139 140  Potassium 3.5 - 5.1 mEq/L 3.8 3.8 3.9  Chloride 96 - 112 mEq/L 104 103 105  CO2 19 - 32 mEq/L 27 29 26   Calcium 8.4 - 10.5 mg/dL 9.1 9.0 9.3    Current antihypertensive regimen: Diltiazem 120 mg 1 cap daily, losartan 100 mg 1 tab daily, and furosemide 40 mg 1 tab daily, but patient does not take every day  How often are you checking your Blood Pressure? Patient states that she does not take her blood pressure every day, but states that when she does take it at home it is usually normal but at the office it runs high  Current home BP readings: Patient states that when she was at Urgent Care it was 173/63  What recent interventions/DTPs have been made by any provider to improve Blood Pressure control since last CPP Visit: Patient to continue with current medications  Any recent hospitalizations or ED visits since last visit with CPP? Yes, patient states that  she was having a persistent cough for a few days that just kept getting worse, so she went to urgent care.  What diet changes have been made to improve Blood Pressure Control?  Patient states that she does try to watch her salt intake, eats more vegetables and fruits.  What exercise is being done to improve your Blood Pressure Control?  Patient states that she tries to stay active as she can but because of the arthritis in her knees she can't do much  Adherence Review: Is the patient currently on ACE/ARB medication? Yes, Losartan Does the patient have >5 day gap between  last estimated fill dates? No   Star Rating Drugs: Losartan 07/14/20 90 ds   Velvet Bathe Clinical Pharmacist Assistant [phone]  Time spent:40

## 2020-08-28 DIAGNOSIS — J300 Vasomotor rhinitis: Secondary | ICD-10-CM | POA: Diagnosis not present

## 2020-08-28 DIAGNOSIS — J4530 Mild persistent asthma, uncomplicated: Secondary | ICD-10-CM | POA: Diagnosis not present

## 2020-08-28 DIAGNOSIS — K219 Gastro-esophageal reflux disease without esophagitis: Secondary | ICD-10-CM | POA: Diagnosis not present

## 2020-09-07 ENCOUNTER — Other Ambulatory Visit: Payer: Self-pay | Admitting: Internal Medicine

## 2020-09-07 DIAGNOSIS — Z1231 Encounter for screening mammogram for malignant neoplasm of breast: Secondary | ICD-10-CM

## 2020-10-02 DIAGNOSIS — H401232 Low-tension glaucoma, bilateral, moderate stage: Secondary | ICD-10-CM | POA: Diagnosis not present

## 2020-10-02 DIAGNOSIS — H25012 Cortical age-related cataract, left eye: Secondary | ICD-10-CM | POA: Diagnosis not present

## 2020-10-02 DIAGNOSIS — H2512 Age-related nuclear cataract, left eye: Secondary | ICD-10-CM | POA: Diagnosis not present

## 2020-10-11 ENCOUNTER — Ambulatory Visit (INDEPENDENT_AMBULATORY_CARE_PROVIDER_SITE_OTHER): Payer: PPO | Admitting: Pharmacist

## 2020-10-11 ENCOUNTER — Other Ambulatory Visit: Payer: Self-pay

## 2020-10-11 DIAGNOSIS — E789 Disorder of lipoprotein metabolism, unspecified: Secondary | ICD-10-CM

## 2020-10-11 DIAGNOSIS — M8949 Other hypertrophic osteoarthropathy, multiple sites: Secondary | ICD-10-CM

## 2020-10-11 DIAGNOSIS — R944 Abnormal results of kidney function studies: Secondary | ICD-10-CM | POA: Diagnosis not present

## 2020-10-11 DIAGNOSIS — M159 Polyosteoarthritis, unspecified: Secondary | ICD-10-CM

## 2020-10-11 DIAGNOSIS — I10 Essential (primary) hypertension: Secondary | ICD-10-CM | POA: Diagnosis not present

## 2020-10-11 DIAGNOSIS — J4520 Mild intermittent asthma, uncomplicated: Secondary | ICD-10-CM

## 2020-10-11 DIAGNOSIS — F411 Generalized anxiety disorder: Secondary | ICD-10-CM

## 2020-10-11 NOTE — Progress Notes (Signed)
Chronic Care Management Pharmacy Note  10/16/2020 Name:  Dominique Johnston MRN:  [id number] DOB:  1946/01/15  Summary: -Discussed kidney function - most recent GFR 45, first time less than 60 since 2015 (AKI vs CKD undetermined as yet). Advised we will continue to monitor  Recommendations/Changes made from today's visit: -Focus on hydration, avoidance of nephrotoxins (NSAIDs) to protect kidneys -Covid booster and Shingrix at local pharmacy -Follow up with orthopedist for special shoes/inserts   Subjective: Dominique Johnston is an 75 y.o. year old female who is a primary patient of Hoyt Koch, MD.  The CCM team was consulted for assistance with disease management and care coordination needs.    Engaged with patient by telephone for follow up visit in response to provider referral for pharmacy case management and/or care coordination services.   Consent to Services:  The patient was given information about Chronic Care Management services, agreed to services, and gave verbal consent prior to initiation of services.  Please see initial visit note for detailed documentation.   Patient Care Team: Hoyt Koch, MD as PCP - General (Internal Medicine) Mosetta Anis, MD (Allergy) Romine, Lubertha South, MD (Obstetrics and Gynecology) Verner Chol, MD as Consulting Physician (Sports Medicine) Katy Apo, MD as Consulting Physician (Ophthalmology) Charlton Haws, [facility] as Pharmacist (Pharmacist)   Pt was married for 29 years, ex-husband was Social research officer, government now lives in [place], moved frequently. Lived in [place], [place], [place], [place], [place], [place]. Has 2 children in [place]. From [place], [place]. Graduated from [employer] in [place]. "My life has been a soap opera". Lost her mom last year at 84 which has been very hard as they were very close. Lost her sister at 73 in car accident. 2 brothers still living. Father died at 87. She is oldest of her  siblings.   Recent office visits: 06/19/20 Dominique Johnston, no medications changes  Recent consult visits: 07/31/20 Dr Prudencio Burly (ophthalmology): f/u macular degeneration 07/22/20 Urgent care: acute bronchitis. Rx'd z-pak and prednisone taper plus flonase  Hospital visits: None in previous 6 months   Objective:  Lab Results  Component Value Date   CREATININE 1.17 06/19/2020   BUN 11 06/19/2020   GFR 45.97 (L) 06/19/2020   GFRNONAA 64.40 07/19/2009   GFRAA 65 07/18/2008   NA 137 06/19/2020   K 3.8 06/19/2020   CALCIUM 9.1 06/19/2020   CO2 27 06/19/2020   GLUCOSE 90 06/19/2020    Lab Results  Component Value Date/Time   GFR 45.97 (L) 06/19/2020 11:22 AM   GFR 64.97 03/22/2019 11:16 AM    Last diabetic Eye exam: No results found for: HMDIABEYEEXA  Last diabetic Foot exam: No results found for: HMDIABFOOTEX   Lab Results  Component Value Date   CHOL 175 06/19/2020   HDL 51.00 06/19/2020   LDLCALC 110 (H) 06/19/2020   LDLDIRECT 137.0 10/22/2011   TRIG 68.0 06/19/2020   CHOLHDL 3 06/19/2020    Hepatic Function Latest Ref Rng & Units 06/19/2020 03/22/2019 04/01/2018  Total Protein 6.0 - 8.3 g/dL 7.0 7.3 7.5  Albumin 3.5 - 5.2 g/dL 3.8 4.0 4.0  AST 0 - 37 U/L $Remo'17 17 17  'ncdqL$ ALT 0 - 35 U/L $Remo'9 14 12  'ngdlz$ Alk Phosphatase 39 - 117 U/L 74 81 88  Total Bilirubin 0.2 - 1.2 mg/dL 0.4 0.4 0.5  Bilirubin, Direct 0.0 - 0.3 mg/dL - - -    Lab Results  Component Value Date/Time   TSH 0.98 04/01/2018 11:06  AM   TSH 2.17 03/31/2017 10:45 AM    CBC Latest Ref Rng & Units 06/19/2020 03/22/2019 04/01/2018  WBC 4.0 - 10.5 K/uL 4.2 4.1 5.8  Hemoglobin 12.0 - 15.0 g/dL 12.3 12.0 12.4  Hematocrit 36.0 - 46.0 % 39.6 38.6 39.8  Platelets 150.0 - 400.0 K/uL 197.0 211.0 231.0    No results found for: VD25OH  Clinical ASCVD: No  The 10-year ASCVD risk score Mikey Bussing DC Jr., et al., 2013) is: 19.8%   Values used to calculate the score:     Age: 71 years     Sex: Female     Is Non-Hispanic African American:  Yes     Diabetic: No     Tobacco smoker: No     Systolic Blood Pressure: 948 mmHg     Is BP treated: Yes     HDL Cholesterol: 51 mg/dL     Total Cholesterol: 175 mg/dL    Depression screen [facility] 2/9 05/09/2020 05/09/2020 04/27/2019  Decreased Interest 0 0 1  Down, Depressed, Hopeless 0 0 1  PHQ - 2 Score 0 0 2  Altered sleeping - - 1  Tired, decreased energy - - 0  Change in appetite - - 0  Feeling bad or failure about yourself  - - 0  Trouble concentrating - - 0  Moving slowly or fidgety/restless - - 0  Suicidal thoughts - - 0  PHQ-9 Score - - 3  Difficult doing work/chores - - Somewhat difficult  Some recent data might be hidden       Social History   Tobacco Use  Smoking Status Never Smoker  Smokeless Tobacco Never Used   BP Readings from Last 3 Encounters:  07/22/20 (!) 173/63  06/19/20 134/84  04/25/20 (!) 142/80   Pulse Readings from Last 3 Encounters:  07/22/20 63  06/19/20 85  04/25/20 66   Wt Readings from Last 3 Encounters:  06/19/20 189 lb 9.6 oz (86 kg)  04/25/20 198 lb (89.8 kg)  11/24/19 205 lb (93 kg)   BMI Readings from Last 3 Encounters:  06/19/20 30.60 kg/m  04/25/20 31.96 kg/m  11/24/19 33.09 kg/m    Assessment/Interventions: Review of patient past medical history, allergies, medications, health status, including review of consultants reports, laboratory and other test data, was performed as part of comprehensive evaluation and provision of chronic care management services.   SDOH:  (Social Determinants of Health) assessments and interventions performed: Yes  SDOH Screenings   Alcohol Screen: Low Risk   . Last Alcohol Screening Score (AUDIT): 0  Depression (PHQ2-9): Low Risk   . PHQ-2 Score: 0  Financial Resource Strain: Low Risk   . Difficulty of Paying Living Expenses: Not very hard  Food Insecurity: No Food Insecurity  . Worried About Charity fundraiser in the Last Year: Never true  . Ran Out of Food in the Last Year: Never  true  Housing: Low Risk   . Last Housing Risk Score: 0  Physical Activity: Inactive  . Days of Exercise per Week: 0 days  . Minutes of Exercise per Session: 0 min  Social Connections: Moderately Integrated  . Frequency of Communication with Friends and Family: More than three times a week  . Frequency of Social Gatherings with Friends and Family: Once a week  . Attends Religious Services: 1 to 4 times per year  . Active Member of Clubs or Organizations: No  . Attends Archivist Meetings: 1 to 4 times per year  .  Marital Status: Divorced  Stress: No Stress Concern Present  . Feeling of Stress : Not at all  Tobacco Use: Low Risk   . Smoking Tobacco Use: Never Smoker  . Smokeless Tobacco Use: Never Used  Transportation Needs: No Transportation Needs  . Lack of Transportation (Medical): No  . Lack of Transportation (Non-Medical): No    CCM Care Plan  Allergies  Allergen Reactions  . Enalapril Maleate     REACTION: swelling, headache  . Sulfamethoxazole-Trimethoprim     Abdominal pain, redness eyes    Medications Reviewed Today    Reviewed by Charlton Haws, [facility] (Pharmacist) on 10/11/20 at 1227  Med List Status: <None>  Medication Order Taking? Sig Documenting Provider Last Dose Status Informant  albuterol (PROVENTIL HFA;VENTOLIN HFA) 108 (90 Base) MCG/ACT inhaler [id number] Yes Inhale 2 puffs into the lungs every 6 (six) hours as needed. Hoyt Koch, MD Taking Active   Ascorbic Acid (VITAMIN C PO) [id number] Yes Take 1 tablet by mouth daily. [provider] Taking Active   BREO ELLIPTA 200-25 MCG/INH AEPB [id number] Yes INHALE 1 PUFF PO QD [provider] Taking Active            Med Note Izola Price, AMY R   Thu Mar 14, 2016  9:06 AM) Received from: External Pharmacy  buPROPion (WELLBUTRIN XL) 150 MG 24 hr tablet [id number] Yes Take 1 tablet (150 mg total) by mouth daily. Hoyt Koch, MD Taking Active   clonazePAM Bobbye Charleston) 0.5 MG  tablet [id number] Yes Take 1 tablet (0.5 mg total) by mouth daily as needed. for anxiety Hoyt Koch, MD Taking Active   Cyanocobalamin (B-12 PO) [id number] Yes Take by mouth daily. [provider] Taking Active   diclofenac Sodium (VOLTAREN) 1 % GEL [id number] Yes Apply 4 g topically 4 (four) times daily. Zigmund Gottron, NP Taking Active   dicyclomine (BENTYL) 20 MG tablet [id number] Yes TAKE 1/2 TABLET BY MOUTH THREE TIMES DAILY BEFORE MEALS Hoyt Koch, MD Taking Active         Discontinued 10/12/12 0915 (Reorder)   diltiazem (TIAZAC) 120 MG 24 hr capsule [id number] Yes TAKE 1 CAPSULE BY MOUTH DAILY Hoyt Koch, MD Taking Active   dorzolamide (TRUSOPT) 2 % ophthalmic solution [id number] Yes Place into both eyes 2 (two) times daily.  [provider] Taking Active            Med Note Izola Price, AMY R   Wed Mar 02, 2014  9:27 AM) Received from: External Pharmacy  escitalopram (LEXAPRO) 10 MG tablet [id number] Yes TAKE 2 TABLETS(20 MG) BY MOUTH DAILY Hoyt Koch, MD Taking Active   fluticasone (FLONASE) 50 MCG/ACT nasal spray [id number] Yes Place 1 spray into both nostrils daily. Hazel Sams, PA-C Taking Active   furosemide (LASIX) 40 MG tablet [id number]  TAKE 1 TABLET(40 MG) BY MOUTH DAILY Hoyt Koch, MD  Active   gabapentin (NEURONTIN) 100 MG capsule [id number] Yes Take 200 mg by mouth at bedtime. Verner Chol, MD Taking Active   ibuprofen (ADVIL) 800 MG tablet [id number] Yes TAKE 1 TABLET(800 MG) BY MOUTH DAILY AS NEEDED FOR MODERATE PAIN Hoyt Koch, MD Taking Active   ipratropium (ATROVENT) 0.06 % nasal spray [id number] Yes Place 2 sprays into both nostrils 4 (four) times daily. Tasia Catchings, Amy V, PA-C Taking Active   latanoprost (XALATAN) 0.005 % ophthalmic solution [id number] Yes Place 1 drop into both eyes daily. Norins, Heinz Knuckles, MD Taking Active   levalbuterol (  XOPENEX) 1.25 MG/3ML nebulizer solution [id number] Yes Take 1.25  mg by nebulization as needed. Norins, Heinz Knuckles, MD Taking Active            Med Note Neal Dy Feb 27, 2015 11:33 AM) Dewaine Conger as needed   levocetirizine (XYZAL) 5 MG tablet [id number] Yes TK 1 T PO QD IN THE EVE [provider] Taking Active   losartan (COZAAR) 100 MG tablet [id number] Yes TAKE 1 TABLET BY MOUTH EVERY DAY Hoyt Koch, MD Taking Active   multivitamin [facility]) tablet [id number] Yes Take 1 tablet by mouth daily. [provider] Taking Active   omeprazole (PRILOSEC) 40 MG capsule [id number] Yes TAKE 1 CAPSULE BY MOUTH EVERY DAY Hoyt Koch, MD Taking Active   potassium chloride (KLOR-CON) 8 MEQ tablet [id number] Yes TAKE 1 TABLET(8 MEQ) BY MOUTH DAILY Hoyt Koch, MD Taking Active   sodium chloride (OCEAN) 0.65 % SOLN nasal spray [id number] Yes Place 1 spray into both nostrils as needed for congestion. Roland Rack, PA-C Taking Active           Patient Active Problem List   Diagnosis Date Noted  . Left hip pain 03/22/2019  . Mild intermittent asthma 10/08/2016  . Routine health maintenance 10/25/2012  . Borderline high cholesterol 01/19/2010  . G E REFLUX 12/10/2007  . Generalized anxiety disorder 12/04/2007  . Essential hypertension 12/01/2006  . Allergic rhinitis 12/01/2006  . IRRITABLE BOWEL SYNDROME 12/01/2006  . Osteoarthritis 12/01/2006    Immunization History  Administered Date(s) Administered  . Fluad Quad(high Dose 65+) 02/16/2019  . Influenza Split 04/05/2011  . Influenza Whole 04/22/2007, 02/23/2008, 02/21/2009, 03/06/2009, 02/28/2010, 01/26/2014  . Influenza, High Dose Seasonal PF 03/06/2017  . Influenza,inj,Quad PF,6+ Mos 01/17/2016  . Influenza-Unspecified 02/21/2015, 02/10/2018, 03/13/2020  . PFIZER(Purple Top)SARS-COV-2 Vaccination 07/28/2019, 08/25/2019, 03/27/2020  . Pneumococcal Conjugate-13 03/16/2015  . Pneumococcal Polysaccharide-23 05/29/2005, 04/05/2011, 06/19/2020  . Tdap 05/03/2019  .  Zoster, Live 07/26/2009    Conditions to be addressed/monitored:  Hypertension, Hyperlipidemia, Asthma, Chronic Kidney Disease, Depression and Anxiety  Care Plan : CCM Pharmacy Care Plan  Updates made by Charlton Haws, [place] since 10/16/2020 12:00 AM    Problem: Hypertension, Hyperlipidemia, Asthma, Chronic Kidney Disease, Depression and Anxiety   Priority: High    Long-Range Goal: Disease management   Start Date: 10/16/2020  Expected End Date: 04/17/2021  This Visit's Progress: On track  Priority: High  Note:    Current Barriers:  . Unable to independently monitor therapeutic efficacy  Pharmacist Clinical Goal(s):  Marland Kitchen Patient will achieve adherence to monitoring guidelines and medication adherence to achieve therapeutic efficacy through collaboration with PharmD and provider.   Interventions: . 1:1 collaboration with Hoyt Koch, MD regarding development and update of comprehensive plan of care as evidenced by provider attestation and co-signature . Inter-disciplinary care team collaboration (see longitudinal plan of care) . Comprehensive medication review performed; medication list updated in electronic medical record  Asthma     Last spirometry score: n/a Patient has failed these meds in past: n/a Patient is currently controlled on the  following medications:   Albuterol HFA 108 mcg/act 2 puff q6hr PRN   Breo Ellipta 200-25 mcg/inh 1 puff daily   Levalbuterol 1.$RemoveBeforeD'25mg'[id number]$ /50mL neb PRN    Using maintenance inhaler regularly? Yes Frequency of rescue inhaler use:  infrequently   We discussed:  Pt reports symptoms are generally well controlled; she does have worse SOB/coughing during spring/pollen; also reports [facility] is generally affordable, price  is high ~last 3 months of the year during donut hole but this is usually manageable.   Plan: Continue current medications    Hypertension    BP goal is:  <140/90  Patient checks BP at home several times per  month Patient home BP readings are ranging: 129/72 - 134/74   Patient has failed these meds in the past: n/a Patient is currently controlled on the following medications:   Diltiazem 120 mg daily PM  Losartan 100 mg daily PM  Furosemide 40 mg daily PRN  Potassium chloride 8 mEq w/ furosemide   We discussed BP goals, optimal timing of BP medications at bedtime for most effective control; does not take furosemide every single day if she is feeling dehydrated.     Plan: Continue current medications    Depression / Anxiety    Patient has failed these meds in past: sertraline, venlafaxine Patient is currently controlled on the following medications:   Clonazepam 0.5 mg daily PRN   Escitalopram 10 mg 2 tabs daily  Bupropion XL 150 mg daily   We discussed: The anniversary of her mother's death is upcoming and pt has been coping by reaching out to family members; she reports doing fairly well currently, denies need to change medications   Plan: Continue current medications  CKD stage 3 (Goal: prevent progression) -Not ideally controlled-  Most recent BMP showed reduced GFR <60 for first time since 2015; this qualifies as CKD stage 3. Discussed importance of hydration and BP control to keep kidneys healthy; discussed dangers of NSAIDs -Plan: increase hydration, avoid nephrotoxins (NSAIDs), focus on BP control  Leg pain/spasms. Lumbar degenerative disease -no injections this year yet. She is using ibuprofen, OTC creams, and massage to manage pain; she thinks some of her pain may be coming from ill-fitting shoes -Recommended asking orthopedist about special shoes/inserts  Health Maintenance -Vaccine gaps: Advised to get Shingrix vaccine and 2nd COVID booster at local pharmacy  Patient Goals/Self-Care Activities . Patient will:  - take medications as prescribed -focus on medication adherence by routine -check blood pressure daily, document, and provide at future  appointments -engage in dietary modifications by increasing hydration  -Limit NSAID use to protect kidneys -Follow up with orthopedist regarding special shoes/inserts      Medication Assistance: None required.  Patient affirms current coverage meets needs.  Compliance/Adherence/Medication fill history: Care Gaps: Shingrix  Star-Rating Drugs: Losartan - LF 07/14/20 x 90 ds  Patient's preferred pharmacy is:  [id number] - [address], [address] [place] [address] Phone: [phone] Fax: [phone]  Uses pill box? No - prefers bottles Pt endorses 100% compliance  We discussed: Current pharmacy is preferred with insurance plan and patient is satisfied with pharmacy services Patient decided to: Continue current medication management strategy  Care Plan and Follow Up Patient Decision:  Patient agrees to Care Plan and Follow-up.  Plan: Telephone follow up appointment with care management team member scheduled for:  6 months  Charlene Brooke, PharmD, [place], CPP Clinical Pharmacist [facility] [phone]

## 2020-10-16 NOTE — Patient Instructions (Signed)
Visit Information  Phone number for Pharmacist: [phone]  Goals Addressed            This Visit's Progress   . Manage My Medicine       Timeframe:  Long-Range Goal Priority:  Medium Start Date:    10/12/20                         Expected End Date:     10/12/21                  Follow Up Date Dec 2022   - call for medicine refill 2 or 3 days before it runs out - call if I am sick and can't take my medicine - keep a list of all the medicines I take; vitamins and herbals too - use a pillbox to sort medicine  -Limit NSAID use to protect kidneys -Follow up with orthopedist regarding special shoes/inserts -Get COVID booster and Shingrix vaccine at local pharmacy   Why is this important?   . These steps will help you keep on track with your medicines.   Notes:       Patient verbalizes understanding of instructions provided today and agrees to view in MyChart.  Telephone follow up appointment with pharmacy team member scheduled for: 6 months  Al Corpus, PharmD, [place], CPP Clinical Pharmacist [facility] [phone]

## 2020-10-27 ENCOUNTER — Ambulatory Visit
Admission: RE | Admit: 2020-10-27 | Discharge: 2020-10-27 | Disposition: A | Discharge status: Home / Self Care | Payer: PPO | Source: Ambulatory Visit | Attending: Internal Medicine | Admitting: Internal Medicine

## 2020-10-27 ENCOUNTER — Other Ambulatory Visit: Payer: Self-pay

## 2020-10-27 DIAGNOSIS — Z1231 Encounter for screening mammogram for malignant neoplasm of breast: Secondary | ICD-10-CM | POA: Diagnosis not present

## 2020-10-31 DIAGNOSIS — H25012 Cortical age-related cataract, left eye: Secondary | ICD-10-CM | POA: Diagnosis not present

## 2020-10-31 DIAGNOSIS — H2512 Age-related nuclear cataract, left eye: Secondary | ICD-10-CM | POA: Diagnosis not present

## 2020-10-31 DIAGNOSIS — H25812 Combined forms of age-related cataract, left eye: Secondary | ICD-10-CM | POA: Diagnosis not present

## 2020-12-04 ENCOUNTER — Other Ambulatory Visit: Payer: Self-pay | Admitting: Internal Medicine

## 2020-12-20 DIAGNOSIS — Z961 Presence of intraocular lens: Secondary | ICD-10-CM | POA: Diagnosis not present

## 2020-12-27 ENCOUNTER — Telehealth: Payer: Self-pay | Admitting: Pharmacist

## 2020-12-27 NOTE — Progress Notes (Addendum)
Chronic Care Management Pharmacy Assistant   Name: Dominique Johnston  MRN: [id number] DOB: Apr 04, 1946   Reason for Encounter: Disease State   Conditions to be addressed/monitored: HTN   Recent office visits:  None noted  Recent consult visits:  10/31/20 [facility] (Combined forms of age-related cataract, left eye)  Hospital visits:  None in previous 6 months  Medications: Outpatient Encounter Medications as of 12/27/2020  Medication Sig Note   albuterol (PROVENTIL HFA;VENTOLIN HFA) 108 (90 Base) MCG/ACT inhaler Inhale 2 puffs into the lungs every 6 (six) hours as needed.    Ascorbic Acid (VITAMIN C PO) Take 1 tablet by mouth daily.    BREO ELLIPTA 200-25 MCG/INH AEPB INHALE 1 PUFF PO QD 03/14/2016: Received from: External Pharmacy   buPROPion (WELLBUTRIN XL) 150 MG 24 hr tablet Take 1 tablet (150 mg total) by mouth daily.    clonazePAM (KLONOPIN) 0.5 MG tablet Take 1 tablet (0.5 mg total) by mouth daily as needed. for anxiety    Cyanocobalamin (B-12 PO) Take by mouth daily.    diclofenac Sodium (VOLTAREN) 1 % GEL Apply 4 g topically 4 (four) times daily.    dicyclomine (BENTYL) 20 MG tablet TAKE 1/2 TABLET BY MOUTH THREE TIMES DAILY BEFORE MEALS    diltiazem (TIAZAC) 120 MG 24 hr capsule TAKE 1 CAPSULE BY MOUTH DAILY    dorzolamide (TRUSOPT) 2 % ophthalmic solution Place into both eyes 2 (two) times daily.  03/02/2014: Received from: External Pharmacy   escitalopram (LEXAPRO) 10 MG tablet TAKE 2 TABLETS(20 MG) BY MOUTH DAILY    fluticasone (FLONASE) 50 MCG/ACT nasal spray Place 1 spray into both nostrils daily.    furosemide (LASIX) 40 MG tablet TAKE 1 TABLET(40 MG) BY MOUTH DAILY    gabapentin (NEURONTIN) 100 MG capsule Take 200 mg by mouth at bedtime.    ibuprofen (ADVIL) 800 MG tablet TAKE 1 TABLET(800 MG) BY MOUTH DAILY AS NEEDED FOR MODERATE PAIN    ipratropium (ATROVENT) 0.06 % nasal spray Place 2 sprays into both nostrils 4 (four) times  daily.    latanoprost (XALATAN) 0.005 % ophthalmic solution Place 1 drop into both eyes daily.    levalbuterol (XOPENEX) 1.25 MG/3ML nebulizer solution Take 1.25 mg by nebulization as needed. 02/27/2015: Takes as needed    levocetirizine (XYZAL) 5 MG tablet TK 1 T PO QD IN THE EVE    losartan (COZAAR) 100 MG tablet TAKE 1 TABLET BY MOUTH EVERY DAY    multivitamin (THERAGRAN) tablet Take 1 tablet by mouth daily.    omeprazole (PRILOSEC) 40 MG capsule TAKE 1 CAPSULE BY MOUTH EVERY DAY    potassium chloride (KLOR-CON) 8 MEQ tablet TAKE 1 TABLET(8 MEQ) BY MOUTH DAILY    sodium chloride (OCEAN) 0.65 % SOLN nasal spray Place 1 spray into both nostrils as needed for congestion.    [DISCONTINUED] diltiazem (DILACOR XR) 120 MG 24 hr capsule Take 1 capsule (120 mg total) by mouth daily.    No facility-administered encounter medications on file as of 12/27/2020.     Recent Office Vitals: BP Readings from Last 3 Encounters:  07/22/20 (!) 173/63  06/19/20 134/84  04/25/20 (!) 142/80   Pulse Readings from Last 3 Encounters:  07/22/20 63  06/19/20 85  04/25/20 66    Wt Readings from Last 3 Encounters:  06/19/20 189 lb 9.6 oz (86 kg)  04/25/20 198 lb (89.8 kg)  11/24/19 205 lb (93 kg)     Kidney Function Lab Results  Component Value Date/Time   CREATININE 1.17 06/19/2020 11:22 AM   CREATININE 1.01 03/22/2019 11:16 AM   GFR 45.97 (L) 06/19/2020 11:22 AM   GFRNONAA 64.40 07/19/2009 09:42 AM   GFRAA 65 07/18/2008 12:27 PM    BMP Latest Ref Rng & Units 06/19/2020 03/22/2019 04/01/2018  Glucose 70 - 99 mg/dL 90 96 160(V)  BUN 6 - 23 mg/dL 11 15 13   Creatinine 0.40 - 1.20 mg/dL 3.71 0.62  Sodium 135 - 145 mEq/L 137 139 140  Potassium 3.5 - 5.1 mEq/L 3.8 3.8 3.9  Chloride 96 - 112 mEq/L 104 103 105  CO2 19 - 32 mEq/L 27 29 26   Calcium 8.4 - 10.5 mg/dL 9.1 9.0 9.3     Contacted patient on 12/27/20 to discuss hypertension disease state  Current antihypertensive regimen:  Diltiazem 120  mg daily PM Losartan 100 mg daily PM Furosemide 40 mg daily PRN Potassium chloride 8 mEq w/ furosemide  Patient verbally confirms she is taking the above medications as directed. Yes  How often are you checking your Blood Pressure? infrequently  she checks her blood pressure in the afternoon after taking her medication.  Current home BP readings:Patient has no home readings  Wrist or arm cuff:Use wrist cuff Caffeine intake:A cup of coffee every morning Salt intake:Does not add much salt to foods OTC medications including pseudoephedrine or NSAIDs?No  Any readings above 180/120? No If yes any symptoms of hypertensive emergency? patient denies any symptoms of high blood pressure   What recent interventions/DTPs have been made by any provider to improve Blood Pressure control since last CPP Visit: None note  Any recent hospitalizations or ED visits since last visit with CPP? No  What diet changes have been made to improve Blood Pressure Control?  Patient states that she has not had any changes to her diet  What exercise is being done to improve your Blood Pressure Control?  Patient states that she does some chair exercises things she can do at home  Adherence Review: Is the patient currently on ACE/ARB medication? Yes Does the patient have >5 day gap between last estimated fill dates? No   Star Rating Drugs:  Medication:  Last Fill: Day Supply Losartan 100 mg 10/12/20  90   Care Gaps: Last annual wellness visit:05/09/20   CCM appointment on 04/12/21    05/11/20 Clinical Pharmacist Assistant [phone]

## 2021-01-01 ENCOUNTER — Other Ambulatory Visit: Payer: Self-pay | Admitting: Internal Medicine

## 2021-01-09 ENCOUNTER — Telehealth: Payer: Self-pay

## 2021-01-09 ENCOUNTER — Other Ambulatory Visit: Payer: Self-pay | Admitting: Internal Medicine

## 2021-01-09 NOTE — Telephone Encounter (Signed)
Refill completed for dic 8/23

## 2021-01-10 ENCOUNTER — Ambulatory Visit (INDEPENDENT_AMBULATORY_CARE_PROVIDER_SITE_OTHER): Payer: PPO | Admitting: Internal Medicine

## 2021-01-10 ENCOUNTER — Other Ambulatory Visit: Payer: Self-pay

## 2021-01-10 ENCOUNTER — Encounter: Payer: Self-pay | Admitting: Internal Medicine

## 2021-01-10 DIAGNOSIS — J4521 Mild intermittent asthma with (acute) exacerbation: Secondary | ICD-10-CM | POA: Diagnosis not present

## 2021-01-10 DIAGNOSIS — R21 Rash and other nonspecific skin eruption: Secondary | ICD-10-CM | POA: Diagnosis not present

## 2021-01-10 MED ORDER — METHYLPREDNISOLONE ACETATE 40 MG/ML IJ SUSP
40.0000 mg | Freq: Once | INTRAMUSCULAR | Status: AC
  Administered 2021-01-10: 40 mg via INTRAMUSCULAR

## 2021-01-10 MED ORDER — TRIAMCINOLONE ACETONIDE 0.1 % EX CREA: 1.0000 "application " | TOPICAL_CREAM | Freq: Two times a day (BID) | CUTANEOUS | 0 refills | Status: DC

## 2021-01-10 NOTE — Assessment & Plan Note (Signed)
Rx triamcinolone ointment to use prn on the rash which is likely eczema.

## 2021-01-10 NOTE — Patient Instructions (Signed)
We have done the steroid shot today for the wheezing. Let us know if it does not improve.  We have sent in the cream to use twice a day when the rash is there.

## 2021-01-10 NOTE — Progress Notes (Signed)
   Subjective:   Patient ID: Dominique Johnston, female    DOB: 30-Apr-1946, 74 y.o.   MRN: [id number]  HPI The patient is a 75 YO female coming in for follow up medical conditions and rash.   Review of Systems  Constitutional: Negative.   HENT: Negative.    Eyes: Negative.   Respiratory:  Positive for cough, shortness of breath and wheezing. Negative for chest tightness.   Cardiovascular:  Negative for chest pain, palpitations and leg swelling.  Gastrointestinal:  Negative for abdominal distention, abdominal pain, constipation, diarrhea, nausea and vomiting.  Musculoskeletal: Negative.   Skin:  Positive for rash.  Neurological: Negative.   Psychiatric/Behavioral: Negative.     Objective:  Physical Exam Constitutional:      Appearance: She is well-developed.  HENT:     Head: Normocephalic and atraumatic.  Cardiovascular:     Rate and Rhythm: Normal rate and regular rhythm.  Pulmonary:     Effort: Pulmonary effort is normal. No respiratory distress.     Breath sounds: Wheezing present. No rales.  Abdominal:     General: Bowel sounds are normal. There is no distension.     Palpations: Abdomen is soft.     Tenderness: There is no abdominal tenderness. There is no rebound.  Musculoskeletal:     Cervical back: Normal range of motion.  Skin:    General: Skin is warm and dry.  Neurological:     Mental Status: She is alert and oriented to person, place, and time.     Coordination: Coordination normal.    Vitals:   01/10/21 1040  BP: 124/90  Pulse: 66  Resp: 18  Temp: 98.5 F (36.9 C)  TempSrc: Oral  SpO2: 98%  Weight: 178 lb (80.7 kg)  Height: 5\' 6"  (1.676 m)    This visit occurred during the SARS-CoV-2 public health emergency.  Safety protocols were in place, including screening questions prior to the visit, additional usage of staff PPE, and extensive cleaning of exam room while observing appropriate contact time as indicated for disinfecting solutions.    Assessment & Plan:  Depo-medrol 40 mg IM given at visit

## 2021-01-10 NOTE — Assessment & Plan Note (Signed)
Given depo-medrol 40 mg IM for flare today and keep breo and albuterol prn.

## 2021-01-23 ENCOUNTER — Encounter: Payer: Self-pay | Admitting: Internal Medicine

## 2021-01-23 MED ORDER — DOXYCYCLINE HYCLATE 100 MG PO TABS: 100.0000 mg | ORAL_TABLET | Freq: Two times a day (BID) | ORAL | 0 refills | Status: DC

## 2021-02-23 ENCOUNTER — Telehealth: Payer: Self-pay

## 2021-02-23 NOTE — Chronic Care Management (AMB) (Signed)
Chronic Care Management Pharmacy Assistant   Name: Dominique Johnston  MRN: [id number] DOB: [birth year]   Reason for Encounter: Disease State   Conditions to be addressed/monitored: HTN   Recent office visits:  01/10/21 Dominique Broker, MD-PCP (Mild intermittent asthma with acute exacerbation)  med changes:triamcinolone ointment to use prn   Recent consult visits:  None ID  Hospital visits:  None in previous 6 months  Medications: Outpatient Encounter Medications as of 02/23/2021  Medication Sig Note   albuterol (PROVENTIL HFA;VENTOLIN HFA) 108 (90 Base) MCG/ACT inhaler Inhale 2 puffs into the lungs every 6 (six) hours as needed.    Ascorbic Acid (VITAMIN C PO) Take 1 tablet by mouth daily.    BREO ELLIPTA 200-25 MCG/INH AEPB INHALE 1 PUFF PO QD 03/14/2016: Received from: External Pharmacy   buPROPion (WELLBUTRIN XL) 150 MG 24 hr tablet Take 1 tablet (150 mg total) by mouth daily.    clonazePAM (KLONOPIN) 0.5 MG tablet Take 1 tablet (0.5 mg total) by mouth daily as needed. for anxiety    Cyanocobalamin (B-12 PO) Take by mouth daily.    diclofenac Sodium (VOLTAREN) 1 % GEL Apply 4 g topically 4 (four) times daily.    dicyclomine (BENTYL) 20 MG tablet TAKE 1/2 TABLET BY MOUTH THREE TIMES DAILY BEFORE MEALS    diltiazem (TIAZAC) 120 MG 24 hr capsule TAKE 1 CAPSULE BY MOUTH DAILY    dorzolamide (TRUSOPT) 2 % ophthalmic solution Place into both eyes 2 (two) times daily.  03/02/2014: Received from: External Pharmacy   doxycycline (VIBRA-TABS) 100 MG tablet Take 1 tablet (100 mg total) by mouth 2 (two) times daily.    escitalopram (LEXAPRO) 10 MG tablet TAKE 2 TABLETS(20 MG) BY MOUTH DAILY    fluticasone (FLONASE) 50 MCG/ACT nasal spray Place 1 spray into both nostrils daily.    furosemide (LASIX) 40 MG tablet TAKE 1 TABLET(40 MG) BY MOUTH DAILY    gabapentin (NEURONTIN) 100 MG capsule Take 100 mg by mouth at bedtime.    ibuprofen (ADVIL) 800 MG tablet TAKE 1 TABLET(800  MG) BY MOUTH DAILY AS NEEDED FOR MODERATE PAIN    ipratropium (ATROVENT) 0.06 % nasal spray Place 2 sprays into both nostrils 4 (four) times daily.    latanoprost (XALATAN) 0.005 % ophthalmic solution Place 1 drop into both eyes daily.    levalbuterol (XOPENEX) 1.25 MG/3ML nebulizer solution Take 1.25 mg by nebulization as needed. 02/27/2015: Takes as needed    levocetirizine (XYZAL) 5 MG tablet TK 1 T PO QD IN THE EVE    losartan (COZAAR) 100 MG tablet TAKE 1 TABLET BY MOUTH EVERY DAY    multivitamin (THERAGRAN) tablet Take 1 tablet by mouth daily.    omeprazole (PRILOSEC) 40 MG capsule TAKE 1 CAPSULE BY MOUTH EVERY DAY    potassium chloride (KLOR-CON) 8 MEQ tablet TAKE 1 TABLET(8 MEQ) BY MOUTH DAILY    sodium chloride (OCEAN) 0.65 % SOLN nasal spray Place 1 spray into both nostrils as needed for congestion.    triamcinolone cream (KENALOG) 0.1 % Apply 1 application topically 2 (two) times daily.    [DISCONTINUED] diltiazem (DILACOR XR) 120 MG 24 hr capsule Take 1 capsule (120 mg total) by mouth daily.    No facility-administered encounter medications on file as of 02/23/2021.   Reviewed chart prior to disease state call. Spoke with patient regarding BP  Recent Office Vitals: BP Readings from Last 3 Encounters:  01/10/21 124/90  07/22/20 (!) 173/63  06/19/20 134/84  Pulse Readings from Last 3 Encounters:  01/10/21 66  07/22/20 63  06/19/20 85    Wt Readings from Last 3 Encounters:  01/10/21 178 lb (80.7 kg)  06/19/20 189 lb 9.6 oz (86 kg)  04/25/20 198 lb (89.8 kg)     Kidney Function Lab Results  Component Value Date/Time   CREATININE 1.17 06/19/2020 11:22 AM   CREATININE 1.01 03/22/2019 11:16 AM   GFR 45.97 (L) 06/19/2020 11:22 AM   GFRNONAA 64.40 07/19/2009 09:42 AM   GFRAA 65 07/18/2008 12:27 PM    BMP Latest Ref Rng & Units 06/19/2020 03/22/2019 04/01/2018  Glucose 70 - 99 mg/dL 90 96 161(W)  BUN 6 - 23 mg/dL 11 15 13   Creatinine 0.40 - 1.20 mg/dL 9.60 4.54   Sodium 135 - 145 mEq/L 137 139 140  Potassium 3.5 - 5.1 mEq/L 3.8 3.8 3.9  Chloride 96 - 112 mEq/L 104 103 105  CO2 19 - 32 mEq/L 27 29 26   Calcium 8.4 - 10.5 mg/dL 9.1 9.0 9.3    Current antihypertensive regimen:  Diltiazem 120 mg daily PM Losartan 100 mg daily PM Furosemide 40 mg daily PRN Potassium chloride 8 mEq w/ furosemide How often are you checking your Blood Pressure? infrequently Current home BP readings: 124/72 that she remembers a few weeks ago What recent interventions/DTPs have been made by any provider to improve Blood Pressure control since last CPP Visit: none noted Any recent hospitalizations or ED visits since last visit with CPP? No What diet changes have been made to improve Blood Pressure Control?  Patient states that she has not had any changes to her diet What exercise is being done to improve your Blood Pressure Control?  Patient states that she does not have regular routine but walks, go up and down the stairs  Adherence Review: Is the patient currently on ACE/ARB medication? Yes Does the patient have >5 day gap between last estimated fill dates? No  Care Gaps:  Star Rating Drugs: Losartan 100 mg         01/09/21              90  CCM appointment on 04/12/21    01/11/21 Clinical Pharmacist Assistant [phone]

## 2021-03-02 DIAGNOSIS — H35352 Cystoid macular degeneration, left eye: Secondary | ICD-10-CM | POA: Diagnosis not present

## 2021-03-02 DIAGNOSIS — Z961 Presence of intraocular lens: Secondary | ICD-10-CM | POA: Diagnosis not present

## 2021-03-02 DIAGNOSIS — H26491 Other secondary cataract, right eye: Secondary | ICD-10-CM | POA: Diagnosis not present

## 2021-03-30 ENCOUNTER — Other Ambulatory Visit: Payer: Self-pay | Admitting: Internal Medicine

## 2021-03-30 DIAGNOSIS — H35352 Cystoid macular degeneration, left eye: Secondary | ICD-10-CM | POA: Diagnosis not present

## 2021-04-12 ENCOUNTER — Telehealth: Payer: PPO

## 2021-04-15 ENCOUNTER — Other Ambulatory Visit: Payer: Self-pay | Admitting: Internal Medicine

## 2021-04-16 ENCOUNTER — Ambulatory Visit (INDEPENDENT_AMBULATORY_CARE_PROVIDER_SITE_OTHER): Payer: PPO

## 2021-04-16 ENCOUNTER — Other Ambulatory Visit: Payer: Self-pay

## 2021-04-16 DIAGNOSIS — R944 Abnormal results of kidney function studies: Secondary | ICD-10-CM

## 2021-04-16 DIAGNOSIS — I10 Essential (primary) hypertension: Secondary | ICD-10-CM

## 2021-04-16 NOTE — Patient Instructions (Signed)
Visit Information  Following are the goals we discussed today:   Manage My Medications   Timeframe:  Long-Range Goal Priority:  Medium Start Date:    10/12/20                         Expected End Date:     04/13/22                  Follow Up Date June 2023   - call for medicine refill 2 or 3 days before it runs out - call if I am sick and can't take my medicine - keep a list of all the medicines I take; vitamins and herbals too - use a pillbox to sort medicine  -Limit NSAID use to protect kidneys -Follow up with orthopedist regarding special shoes/inserts -Get COVID booster and Shingrix vaccine at local pharmacy   Why is this important?   These steps will help you keep on track with your medicines.  Plan: Telephone follow up appointment with care management team member scheduled for:  6 months  The patient has been provided with contact information for the care management team and has been advised to call with any health related questions or concerns.   Ellin Saba, PharmD Clinical Pharmacist, [facility]    Please call the care guide team at [phone] if you need to cancel or reschedule your appointment.   Patient verbalizes understanding of instructions provided today and agrees to view in MyChart.

## 2021-04-16 NOTE — Progress Notes (Signed)
Chronic Care Management Pharmacy Note  04/16/2021 Name:  Dominique Johnston MRN:  [id number] DOB:  [birth year]  Summary: -Patient reports that she has been doing well, had cataract surgery June 2022 - has follow up with optometrist later this month -Reports that asthma is well controlled, notes that changes in weather can sometimes exacerbate her symptoms, but no issues at this time  -Checking BP at home - recently has been averaging 120/75  Recommendations/Changes made from today's visit: -Focus on hydration, avoidance of nephrotoxins (NSAIDs) to protect kidneys -Patient to continue to monitor blood pressure at home at least once weekly  -Patient to monitor breathing / cough - aware to reach out should she develop any symptoms of asthma exacerbation / worsening SOB  -Plans to get shingles vaccine at beginning of next year with changes in benefits   Subjective: Dominique Johnston is an 75 y.o. year old female who is a primary patient of [name], MD.  The CCM team was consulted for assistance with disease management and care coordination needs.    Engaged with patient by telephone for follow up visit in response to provider referral for pharmacy case management and/or care coordination services.   Consent to Services:  The patient was given information about Chronic Care Management services, agreed to services, and gave verbal consent prior to initiation of services.  Please see initial visit note for detailed documentation.   Patient Care Team: [name], MD as PCP - General (Internal Medicine) Sidney Ace, MD (Allergy) Romine, Edwena Felty, MD (Obstetrics and Gynecology) Melina Fiddler, MD as Consulting Physician (Sports Medicine) Antony Contras, MD as Consulting Physician (Ophthalmology) Kathyrn Sheriff, [facility] as Pharmacist (Pharmacist)   Pt was married for 29 years, ex-husband was Company secretary now lives in [place], moved frequently.  Lived in [place], [place], [place], [place], [place], [place]. Has 2 children in [place]. From [place], [place]. Graduated from [employer] in [place]. "My life has been a soap opera". Lost her mom last year at 87 which has been very hard as they were very close. Lost her sister at 34 in car accident. 2 brothers still living. Father died at 49. She is oldest of her siblings.   Recent office visits: 01/10/2021 - Dr. [name] - evaluation of rash - given depo medrol injection - prescribed triamcinolone for rash   Recent consult visits: 10/31/20 Dr Randon Goldsmith (ophthalmology): f/u macular degeneration - notes not available    Hospital visits: None in previous 6 months   Objective:  Lab Results  Component Value Date   CREATININE 1.17 06/19/2020   BUN 11 06/19/2020   GFR 45.97 (L) 06/19/2020   GFRNONAA 64.40 07/19/2009   GFRAA 65 07/18/2008   NA 137 06/19/2020   K 3.8 06/19/2020   CALCIUM 9.1 06/19/2020   CO2 27 06/19/2020   GLUCOSE 90 06/19/2020    Lab Results  Component Value Date/Time   GFR 45.97 (L) 06/19/2020 11:22 AM   GFR 64.97 03/22/2019 11:16 AM    Last diabetic Eye exam: No results found for: HMDIABEYEEXA  Last diabetic Foot exam: No results found for: HMDIABFOOTEX   Lab Results  Component Value Date   CHOL 175 06/19/2020   HDL 51.00 06/19/2020   LDLCALC 110 (H) 06/19/2020   LDLDIRECT 137.0 10/22/2011   TRIG 68.0 06/19/2020   CHOLHDL 3 06/19/2020    Hepatic Function Latest Ref Rng & Units 06/19/2020 03/22/2019 04/01/2018  Total Protein 6.0 - 8.3 g/dL 7.0 7.3  7.5  Albumin 3.5 - 5.2 g/dL 3.8 4.0 4.0  AST 0 - 37 U/L $Remo'17 17 17  'ltHtF$ ALT 0 - 35 U/L $Remo'9 14 12  'jApBh$ Alk Phosphatase 39 - 117 U/L 74 81 88  Total Bilirubin 0.2 - 1.2 mg/dL 0.4 0.4 0.5  Bilirubin, Direct 0.0 - 0.3 mg/dL - - -    Lab Results  Component Value Date/Time   TSH 0.98 04/01/2018 11:06 AM   TSH 2.17 03/31/2017 10:45 AM    CBC Latest Ref Rng & Units 06/19/2020 03/22/2019 04/01/2018  WBC 4.0 - 10.5 K/uL 4.2 4.1 5.8   Hemoglobin 12.0 - 15.0 g/dL 12.3 12.0 12.4  Hematocrit 36.0 - 46.0 % 39.6 38.6 39.8  Platelets 150.0 - 400.0 K/uL 197.0 211.0 231.0    No results found for: VD25OH  Clinical ASCVD: No  The 10-year ASCVD risk score (Arnett DK, et al., 2019) is: 12.7%   Values used to calculate the score:     Age: 59 years     Sex: Female     Is Non-Hispanic African American: Yes     Diabetic: No     Tobacco smoker: No     Systolic Blood Pressure: 097 mmHg     Is BP treated: Yes     HDL Cholesterol: 51 mg/dL     Total Cholesterol: 175 mg/dL    Depression screen [facility] 2/9 05/09/2020 05/09/2020 04/27/2019  Decreased Interest 0 0 1  Down, Depressed, Hopeless 0 0 1  PHQ - 2 Score 0 0 2  Altered sleeping - - 1  Tired, decreased energy - - 0  Change in appetite - - 0  Feeling bad or failure about yourself  - - 0  Trouble concentrating - - 0  Moving slowly or fidgety/restless - - 0  Suicidal thoughts - - 0  PHQ-9 Score - - 3  Difficult doing work/chores - - Somewhat difficult  Some recent data might be hidden       Social History   Tobacco Use  Smoking Status Never  Smokeless Tobacco Never   BP Readings from Last 3 Encounters:  01/10/21 124/90  07/22/20 (!) 173/63  06/19/20 134/84   Pulse Readings from Last 3 Encounters:  01/10/21 66  07/22/20 63  06/19/20 85   Wt Readings from Last 3 Encounters:  01/10/21 178 lb (80.7 kg)  06/19/20 189 lb 9.6 oz (86 kg)  04/25/20 198 lb (89.8 kg)   BMI Readings from Last 3 Encounters:  01/10/21 28.73 kg/m  06/19/20 30.60 kg/m  04/25/20 31.96 kg/m    Assessment/Interventions: Review of patient past medical history, allergies, medications, health status, including review of consultants reports, laboratory and other test data, was performed as part of comprehensive evaluation and provision of chronic care management services.   SDOH:  (Social Determinants of Health) assessments and interventions performed: Yes  SDOH Screenings   Alcohol  Screen: Low Risk    Last Alcohol Screening Score (AUDIT): 0  Depression (PHQ2-9): Low Risk    PHQ-2 Score: 0  Financial Resource Strain: Not on file  Food Insecurity: No Food Insecurity   Worried About Charity fundraiser in the Last Year: Never true   Ran Out of Food in the Last Year: Never true  Housing: Low Risk    Last Housing Risk Score: 0  Physical Activity: Inactive   Days of Exercise per Week: 0 days   Minutes of Exercise per Session: 0 min  Social Connections: Moderately Integrated   Frequency of  Communication with Friends and Family: More than three times a week   Frequency of Social Gatherings with Friends and Family: Once a week   Attends Religious Services: 1 to 4 times per year   Active Member of [employer] or Organizations: No   Attends Music therapist: 1 to 4 times per year   Marital Status: Divorced  Stress: No Stress Concern Present   Feeling of Stress : Not at all  Tobacco Use: Low Risk    Smoking Tobacco Use: Never   Smokeless Tobacco Use: Never   Passive Exposure: Not on file  Transportation Needs: No Transportation Needs   Lack of Transportation (Medical): No   Lack of Transportation (Non-Medical): No    CCM Care Plan  Allergies  Allergen Reactions   Enalapril Maleate     REACTION: swelling, headache   Sulfamethoxazole-Trimethoprim     Abdominal pain, redness eyes    Medications Reviewed Today     Reviewed by Tomasa Blase, [facility] (Pharmacist) on 04/16/21 at [place] List Status: <None>   Medication Order Taking? Sig Documenting Provider Last Dose Status Informant  albuterol (PROVENTIL HFA;VENTOLIN HFA) 108 (90 Base) MCG/ACT inhaler [id number]  Inhale 2 puffs into the lungs every 6 (six) hours as needed. Hoyt Koch, MD  Active   Ascorbic Acid (VITAMIN C PO) [id number]  Take 1 tablet by mouth daily. [provider]  Active   Adair Patter 200-25 MCG/INH AEPB [id number] Yes INHALE 1 PUFF PO QD [provider] Taking  Active            Med Note Izola Price, AMY R   Thu Mar 14, 2016  9:06 AM) Received from: External Pharmacy  Brinzolamide-Brimonidine [facility]) 1-0.2 % SUSP [id number] Yes Place 1 drop into the left eye in the morning and at bedtime. [provider] Taking Active   Bromfenac Sodium 0.07 % SOLN [id number] Yes Place 1 drop into the left eye daily. [provider] Taking Active   buPROPion (WELLBUTRIN XL) 150 MG 24 hr tablet [id number]  Take 1 tablet (150 mg total) by mouth daily. Hoyt Koch, MD  Active   clonazePAM [facility]) 0.5 MG tablet [id number]  Take 1 tablet (0.5 mg total) by mouth daily as needed. for anxiety Hoyt Koch, MD  Active   Cyanocobalamin (B-12 PO) [id number]  Take by mouth daily. [provider]  Active   diclofenac Sodium (VOLTAREN) 1 % GEL [id number]  Apply 4 g topically 4 (four) times daily. Zigmund Gottron, NP  Active   dicyclomine (BENTYL) 20 MG tablet [id number]  TAKE 1/2 TABLET BY MOUTH THREE TIMES DAILY BEFORE MEALS Hoyt Koch, MD  Active     Discontinued 10/12/12 0915 (Reorder)   diltiazem (TIAZAC) 120 MG 24 hr capsule [id number]  TAKE 1 CAPSULE BY MOUTH DAILY Hoyt Koch, MD  Active   dorzolamide (TRUSOPT) 2 % ophthalmic solution [id number]  Place into both eyes 2 (two) times daily.  [provider]  Active            Med Note Izola Price, AMY R   Wed Mar 02, 2014  9:27 AM) Received from: External Pharmacy  escitalopram (LEXAPRO) 10 MG tablet [id number]  TAKE 2 TABLETS(20 MG) BY MOUTH DAILY Hoyt Koch, MD  Active   fluticasone (FLONASE) 50 MCG/ACT nasal spray [id number]  Place 1 spray into both nostrils daily. Hazel Sams, PA-C  Active   furosemide (LASIX) 40 MG tablet [id number]  TAKE 1 TABLET(40  MG) BY MOUTH DAILY Hoyt Koch, MD  Active   gabapentin (NEURONTIN) 100 MG capsule [id number]  Take 100 mg by mouth at bedtime. Verner Chol, MD  Active   ibuprofen (ADVIL) 800 MG  tablet [id number] No TAKE 1 TABLET(800 MG) BY MOUTH DAILY AS NEEDED FOR MODERATE PAIN  Patient not taking: Reported on 04/16/2021   Hoyt Koch, MD Not Taking Active   ipratropium (ATROVENT) 0.06 % nasal spray [id number]  Place 2 sprays into both nostrils 4 (four) times daily. Tasia Catchings, Amy V, PA-C  Active   latanoprost (XALATAN) 0.005 % ophthalmic solution [id number]  Place 1 drop into both eyes daily. Norins, Heinz Knuckles, MD  Active   levalbuterol Penne Lash) 1.25 MG/3ML nebulizer solution [id number]  Take 1.25 mg by nebulization as needed. Norins, Heinz Knuckles, MD  Active            Med Note Wynetta Fines   Mon Feb 27, 2015 11:33 AM) Dewaine Conger as needed   levocetirizine (XYZAL) 5 MG tablet [id number]  TK 1 T PO QD IN THE EVE [provider]  Active   losartan (COZAAR) 100 MG tablet [id number]  TAKE 1 TABLET BY MOUTH EVERY DAY Hoyt Koch, MD  Active   multivitamin [facility]) tablet [id number]  Take 1 tablet by mouth daily. [provider]  Active   omeprazole (PRILOSEC) 40 MG capsule [id number]  TAKE 1 CAPSULE BY MOUTH EVERY DAY Hoyt Koch, MD  Active   potassium chloride (KLOR-CON) 8 MEQ tablet [id number]  TAKE 1 TABLET(8 MEQ) BY MOUTH DAILY Hoyt Koch, MD  Active   prednisoLONE acetate (PRED FORTE) 1 % ophthalmic suspension [id number] Yes Place 1 drop into the left eye 4 (four) times daily. [provider] Taking Active   sodium chloride (OCEAN) 0.65 % SOLN nasal spray [id number]  Place 1 spray into both nostrils as needed for congestion. Darr, Edison Nasuti, PA-C  Active   triamcinolone cream (KENALOG) 0.1 % [id number]  Apply 1 application topically 2 (two) times daily. Hoyt Koch, MD  Active             Patient Active Problem List   Diagnosis Date Noted   Rash 01/10/2021   Left hip pain 03/22/2019   Mild intermittent asthma 10/08/2016   Routine health maintenance 10/25/2012   Borderline high cholesterol 01/19/2010   G E REFLUX 12/10/2007    Generalized anxiety disorder 12/04/2007   Essential hypertension 12/01/2006   Allergic rhinitis 12/01/2006   IRRITABLE BOWEL SYNDROME 12/01/2006   Osteoarthritis 12/01/2006    Immunization History  Administered Date(s) Administered   Fluad Quad(high Dose 65+) 02/16/2019   Influenza Split 04/05/2011   Influenza Whole 04/22/2007, 02/23/2008, 02/21/2009, 03/06/2009, 02/28/2010, 01/26/2014   Influenza, High Dose Seasonal PF 03/06/2017   Influenza,inj,Quad PF,6+ Mos 01/17/2016   Influenza-Unspecified 02/21/2015, 02/10/2018, 03/13/2020, 03/04/2021   PFIZER(Purple Top)SARS-COV-2 Vaccination 07/28/2019, 08/25/2019, 03/27/2020, 03/04/2021   Pneumococcal Conjugate-13 03/16/2015   Pneumococcal Polysaccharide-23 05/29/2005, 04/05/2011, 06/19/2020   Tdap 05/03/2019   Zoster, Live 07/26/2009    Conditions to be addressed/monitored:  Hypertension, Hyperlipidemia, Asthma, Chronic Kidney Disease, Depression and Anxiety  Care Plan : [place]  Updates made by Tomasa Blase, RPH since 04/16/2021 12:00 AM     Problem: Hypertension, Hyperlipidemia, Asthma, Chronic Kidney Disease, Depression and Anxiety   Priority: High     Long-Range Goal: Disease management   Start Date: 10/16/2020  Expected End Date: 04/17/2021  This Visit's Progress: On track  Recent Progress: On  track  Priority: High  Note:    Current Barriers:  Unable to independently monitor therapeutic efficacy  Pharmacist Clinical Goal(s):  Patient will achieve adherence to monitoring guidelines and medication adherence to achieve therapeutic efficacy through collaboration with PharmD and provider.   Interventions: 1:1 collaboration with Hoyt Koch, MD regarding development and update of comprehensive plan of care as evidenced by provider attestation and co-signature Inter-disciplinary care team collaboration (see longitudinal plan of care) Comprehensive medication review performed; medication list updated  in electronic medical record  Asthma - prevention of exacerbation     Last spirometry score: n/a Patient has failed these meds in past: n/a Patient is currently controlled on the  following medications:  Albuterol HFA 108 mcg/act 2 puff q6hr PRN  Breo Ellipta 200-25 mcg/inh 1 puff daily  Levalbuterol 1.$RemoveBeforeD'25mg'zGMVagDnOOzoEL$ /23mL neb PRN    Using maintenance inhaler regularly? Yes Frequency of rescue inhaler use:  infrequently   We discussed:  Pt reports symptoms are generally well controlled; she does have worse SOB/coughing during spring/pollen / colder weather   Plan: Continue current medications    Hypertension    BP goal is:  <140/90  Patient checks BP at home several times per month Patient home BP readings are ranging: 1120's/70's recnetly    Patient has failed these meds in the past: n/a Patient is currently controlled on the following medications:  Diltiazem 120 mg daily PM Losartan 100 mg daily PM Furosemide 40 mg daily PRN Potassium chloride 8 mEq w/ furosemide   We discussed BP goals, optimal timing of BP medications at bedtime for most effective control; does not take furosemide every single day if she is feeling dehydrated.     Plan: Continue current medications    Depression / Anxiety    Patient has failed these meds in past: sertraline, venlafaxine Patient is currently controlled on the following medications:  Clonazepam 0.5 mg daily PRN  Escitalopram 10 mg 2 tabs daily Bupropion XL 150 mg daily     Plan: Continue current medications  CKD stage 3 (Goal: prevent progression) -Not ideally controlled-  Most recent BMP showed reduced GFR <60 for first time since 2015; this qualifies as CKD stage 3. Discussed importance of hydration and BP control to keep kidneys healthy; discussed dangers of NSAIDs -Plan: increase hydration, avoid nephrotoxins (NSAIDs), focus on BP control  Health Maintenance -Vaccine gaps: Advised to get Shingrix vaccine - due to changes in medicare  coverage, will get with start of new year   Patient Goals/Self-Care Activities Patient will:  - take medications as prescribed -focus on medication adherence by routine -check blood pressure at least once weekly, document, and provide at future appointments -engage in dietary modifications by increasing hydration  -Limit NSAID use to protect kidneys       Medication Assistance: None required.  Patient affirms current coverage meets needs.  Compliance/Adherence/Medication fill history: Care Gaps: Shingrix  Star-Rating Drugs: Losartan - LF 07/14/20 x 90 ds  Patient's preferred pharmacy is:  [id number] - [address] [place] [address] Phone: [phone] Fax: [phone]  Uses pill box? No - prefers bottles Pt endorses 100% compliance  We discussed: Current pharmacy is preferred with insurance plan and patient is satisfied with pharmacy services Patient decided to: Continue current medication management strategy  Care Plan and Follow Up Patient Decision:  Patient agrees to Care Plan and Follow-up.  Plan: Telephone follow  up appointment with care management team member scheduled for:  6 months  Tomasa Blase, PharmD Clinical Pharmacist, [place]

## 2021-05-02 DIAGNOSIS — H35352 Cystoid macular degeneration, left eye: Secondary | ICD-10-CM | POA: Diagnosis not present

## 2021-05-06 ENCOUNTER — Other Ambulatory Visit: Payer: Self-pay | Admitting: Internal Medicine

## 2021-05-12 DIAGNOSIS — I10 Essential (primary) hypertension: Secondary | ICD-10-CM | POA: Diagnosis not present

## 2021-05-12 DIAGNOSIS — R944 Abnormal results of kidney function studies: Secondary | ICD-10-CM | POA: Diagnosis not present

## 2021-05-16 ENCOUNTER — Telehealth: Payer: PPO | Admitting: Physician Assistant

## 2021-05-16 DIAGNOSIS — B9689 Other specified bacterial agents as the cause of diseases classified elsewhere: Secondary | ICD-10-CM | POA: Diagnosis not present

## 2021-05-16 DIAGNOSIS — J208 Acute bronchitis due to other specified organisms: Secondary | ICD-10-CM

## 2021-05-16 MED ORDER — BENZONATATE 100 MG PO CAPS: 100.0000 mg | ORAL_CAPSULE | Freq: Three times a day (TID) | ORAL | 0 refills | Status: DC | PRN

## 2021-05-16 MED ORDER — AZITHROMYCIN 250 MG PO TABS
ORAL_TABLET | ORAL | 0 refills | Status: AC
Start: 2021-05-16 — End: 2021-05-21

## 2021-05-16 NOTE — Patient Instructions (Addendum)
Dominique Johnston, thank you for joining Piedad Climes, PA-C for today's virtual visit.  While this provider is not your primary care provider (PCP), if your PCP is located in our provider database this encounter information will be shared with them immediately following your visit.  Consent: (Patient) Dominique Johnston provided verbal consent for this virtual visit at the beginning of the encounter.  Current Medications:  Current Outpatient Medications:    azithromycin (ZITHROMAX) 250 MG tablet, Take 2 tablets on day 1, then 1 tablet daily on days 2 through 5, Disp: 6 tablet, Rfl: 0   benzonatate (TESSALON) 100 MG capsule, Take 1 capsule (100 mg total) by mouth 3 (three) times daily as needed for cough., Disp: 30 capsule, Rfl: 0   albuterol (PROVENTIL HFA;VENTOLIN HFA) 108 (90 Base) MCG/ACT inhaler, Inhale 2 puffs into the lungs every 6 (six) hours as needed., Disp: 18 g, Rfl: 1   Ascorbic Acid (VITAMIN C PO), Take 1 tablet by mouth daily., Disp: , Rfl:    BREO ELLIPTA 200-25 MCG/INH AEPB, INHALE 1 PUFF PO QD, Disp: , Rfl: 5   Brinzolamide-Brimonidine (SIMBRINZA) 1-0.2 % SUSP, Place 1 drop into the left eye in the morning and at bedtime., Disp: , Rfl:    Bromfenac Sodium 0.07 % SOLN, Place 1 drop into the left eye daily., Disp: , Rfl:    buPROPion (WELLBUTRIN XL) 150 MG 24 hr tablet, Take 1 tablet (150 mg total) by mouth daily., Disp: 90 tablet, Rfl: 3   clonazePAM (KLONOPIN) 0.5 MG tablet, TAKE 1 TABLET(0.5 MG) BY MOUTH DAILY AS NEEDED FOR ANXIETY, Disp: 30 tablet, Rfl: 1   Cyanocobalamin (B-12 PO), Take by mouth daily., Disp: , Rfl:    diclofenac Sodium (VOLTAREN) 1 % GEL, Apply 4 g topically 4 (four) times daily., Disp: 350 g, Rfl: 0   dicyclomine (BENTYL) 20 MG tablet, TAKE 1/2 TABLET BY MOUTH THREE TIMES DAILY BEFORE MEALS, Disp: 45 tablet, Rfl: 1   diltiazem (TIAZAC) 120 MG 24 hr capsule, TAKE 1 CAPSULE BY MOUTH DAILY, Disp: 90 capsule, Rfl: 3   dorzolamide (TRUSOPT) 2  % ophthalmic solution, Place into both eyes 2 (two) times daily. , Disp: , Rfl:    escitalopram (LEXAPRO) 10 MG tablet, TAKE 2 TABLETS(20 MG) BY MOUTH DAILY, Disp: 180 tablet, Rfl: 3   fluticasone (FLONASE) 50 MCG/ACT nasal spray, Place 1 spray into both nostrils daily., Disp: 15 mL, Rfl: 2   furosemide (LASIX) 40 MG tablet, TAKE 1 TABLET(40 MG) BY MOUTH DAILY, Disp: 90 tablet, Rfl: 0   gabapentin (NEURONTIN) 100 MG capsule, Take 100 mg by mouth at bedtime., Disp: , Rfl:    ibuprofen (ADVIL) 800 MG tablet, TAKE 1 TABLET(800 MG) BY MOUTH DAILY AS NEEDED FOR MODERATE PAIN (Patient not taking: Reported on 04/16/2021), Disp: 90 tablet, Rfl: 1   ipratropium (ATROVENT) 0.06 % nasal spray, Place 2 sprays into both nostrils 4 (four) times daily., Disp: 15 mL, Rfl: 0   latanoprost (XALATAN) 0.005 % ophthalmic solution, Place 1 drop into both eyes daily., Disp: 2.5 mL, Rfl: 11   levalbuterol (XOPENEX) 1.25 MG/3ML nebulizer solution, Take 1.25 mg by nebulization as needed., Disp: 72 mL, Rfl: 11   levocetirizine (XYZAL) 5 MG tablet, TK 1 T PO QD IN THE EVE, Disp: , Rfl: 6   losartan (COZAAR) 100 MG tablet, TAKE 1 TABLET BY MOUTH EVERY DAY, Disp: 90 tablet, Rfl: 1   multivitamin (THERAGRAN) tablet, Take 1 tablet by mouth daily., Disp: , Rfl:  omeprazole (PRILOSEC) 40 MG capsule, TAKE 1 CAPSULE BY MOUTH EVERY DAY, Disp: 90 capsule, Rfl: 3   potassium chloride (KLOR-CON) 8 MEQ tablet, TAKE 1 TABLET(8 MEQ) BY MOUTH DAILY, Disp: 90 tablet, Rfl: 3   prednisoLONE acetate (PRED FORTE) 1 % ophthalmic suspension, Place 1 drop into the left eye 4 (four) times daily., Disp: , Rfl:    sodium chloride (OCEAN) 0.65 % SOLN nasal spray, Place 1 spray into both nostrils as needed for congestion., Disp: 44 mL, Rfl: 0   triamcinolone cream (KENALOG) 0.1 %, Apply 1 application topically 2 (two) times daily., Disp: 100 g, Rfl: 0   Medications ordered in this encounter:  Meds ordered this encounter  Medications   benzonatate  (TESSALON) 100 MG capsule    Sig: Take 1 capsule (100 mg total) by mouth 3 (three) times daily as needed for cough.    Dispense:  30 capsule    Refill:  0    Order Specific Question:   Supervising Provider    Answer:   MILLER, BRIAN [[id number]]   azithromycin (ZITHROMAX) 250 MG tablet    Sig: Take 2 tablets on day 1, then 1 tablet daily on days 2 through 5    Dispense:  6 tablet    Refill:  0    Order Specific Question:   Supervising Provider    Answer:   Eber Hong [[id number]]     *If you need refills on other medications prior to your next appointment, please contact your pharmacy*  Follow-Up: Call back or seek an in-person evaluation if the symptoms worsen or if the condition fails to improve as anticipated.  Other Instructions Take antibiotic (Azithromycin - had to send this in instead of Clarithromycin due to interaction with one of your medications) as directed.  Increase fluids.  Get plenty of rest. Use Mucinex for congestion. Use the Tessalon as directed for cough. Take a daily probiotic (I recommend Align or Culturelle, but even Activia Yogurt may be beneficial).  A humidifier placed in the bedroom may offer some relief for a dry, scratchy throat of nasal irritation.  Read information below on acute bronchitis. Please call or return to clinic if symptoms are not improving.  Acute Bronchitis Bronchitis is when the airways that extend from the windpipe into the lungs get red, puffy, and painful (inflamed). Bronchitis often causes thick spit (mucus) to develop. This leads to a cough. A cough is the most common symptom of bronchitis. In acute bronchitis, the condition usually begins suddenly and goes away over time (usually in 2 weeks). Smoking, allergies, and asthma can make bronchitis worse. Repeated episodes of bronchitis may cause more lung problems.  HOME CARE Rest. Drink enough fluids to keep your pee (urine) clear or pale yellow (unless you need to limit fluids as told by your  doctor). Only take over-the-counter or prescription medicines as told by your doctor. Avoid smoking and secondhand smoke. These can make bronchitis worse. If you are a smoker, think about using nicotine gum or skin patches. Quitting smoking will help your lungs heal faster. Reduce the chance of getting bronchitis again by: Washing your hands often. Avoiding people with cold symptoms. Trying not to touch your hands to your mouth, nose, or eyes. Follow up with your doctor as told.  GET HELP IF: Your symptoms do not improve after 1 week of treatment. Symptoms include: Cough. Fever. Coughing up thick spit. Body aches. Chest congestion. Chills. Shortness of breath. Sore throat.  GET HELP RIGHT AWAY IF:  You have an increased fever. You have chills. You have severe shortness of breath. You have bloody thick spit (sputum). You throw up (vomit) often. You lose too much body fluid (dehydration). You have a severe headache. You faint.  MAKE SURE YOU:  Understand these instructions. Will watch your condition. Will get help right away if you are not doing well or get worse. Document Released: 10/16/2007 Document Revised: 12/30/2012 Document Reviewed: 10/20/2012 [facility] Patient Information 2015 [employer] [place]. This information is not intended to replace advice given to you by your health care provider. Make sure you discuss any questions you have with your health care provider.   If you have been instructed to have an in-person evaluation today at a local Urgent Care facility, please use the link below. It will take you to a list of all of our available [facility], including address, phone number and hours of operation. Please do not delay care.  [facility]  If you or a family member do not have a primary care provider, use the link below to schedule a visit and establish care. When you choose a [facility] primary care physician or advanced practice  provider, you gain a long-term partner in health. Find a Primary Care Provider  Learn more about [facility]'s in-office and virtual care options: [facility] Get Care Now

## 2021-05-16 NOTE — Progress Notes (Signed)
Virtual Visit Consent   Dominique Johnston, you are scheduled for a virtual visit with a [facility] provider today.     Just as with appointments in the office, your consent must be obtained to participate.  Your consent will be active for this visit and any virtual visit you may have with one of our providers in the next 365 days.     If you have a MyChart account, a copy of this consent can be sent to you electronically.  All virtual visits are billed to your insurance company just like a traditional visit in the office.    As this is a virtual visit, video technology does not allow for your provider to perform a traditional examination.  This may limit your provider's ability to fully assess your condition.  If your provider identifies any concerns that need to be evaluated in person or the need to arrange testing (such as labs, EKG, etc.), we will make arrangements to do so.     Although advances in technology are sophisticated, we cannot ensure that it will always work on either your end or our end.  If the connection with a video visit is poor, the visit may have to be switched to a telephone visit.  With either a video or telephone visit, we are not always able to ensure that we have a secure connection.     I need to obtain your verbal consent now.   Are you willing to proceed with your visit today?    Dominique Johnston has provided verbal consent on 05/16/2021 for a virtual visit (video or telephone).   Leeanne Rio, [place]   Date: 05/16/2021 6:40 PM   Virtual Visit via Video Note   I, Leeanne Rio, connected with  Dominique Johnston  ([id number], 04-22-1946) on 05/16/21 at  6:30 PM EST by a video-enabled telemedicine application and verified that I am speaking with the correct person using two identifiers.  Location: Patient: Virtual Visit Location Patient: Home Provider: Virtual Visit Location Provider: Home Office   I discussed the limitations of  evaluation and management by telemedicine and the availability of in person appointments. The patient expressed understanding and agreed to proceed.    History of Present Illness: Dominique Johnston is a 76 y.o. who identifies as a female who was assigned female at birth, and is being seen today for URI symptoms over the past 2 weeks. Notes initially starting with chest congestion and cough that was initially dry but is now productive of thick, colored phlegm. Notes some low-grade fever the past two days. Has history of asthma but seems to be stable with her Breo and albuterol inhalers. COVID booster and flu shot are up-to-date. Denies recent travel or sick contact. Wears mask. Has been taking OTC Mucinex-DM and keeping hydrated. Marland Kitchen  HPI: HPI  Problems:  Patient Active Problem List   Diagnosis Date Noted   Rash 01/10/2021   Left hip pain 03/22/2019   Mild intermittent asthma 10/08/2016   Routine health maintenance 10/25/2012   Borderline high cholesterol 01/19/2010   G E REFLUX 12/10/2007   Generalized anxiety disorder 12/04/2007   Essential hypertension 12/01/2006   Allergic rhinitis 12/01/2006   IRRITABLE BOWEL SYNDROME 12/01/2006   Osteoarthritis 12/01/2006    Allergies:  Allergies  Allergen Reactions   Enalapril Maleate     REACTION: swelling, headache   Sulfamethoxazole-Trimethoprim     Abdominal pain, redness eyes   Medications:  Current Outpatient Medications:  albuterol (PROVENTIL HFA;VENTOLIN HFA) 108 (90 Base) MCG/ACT inhaler, Inhale 2 puffs into the lungs every 6 (six) hours as needed., Disp: 18 g, Rfl: 1   Ascorbic Acid (VITAMIN C PO), Take 1 tablet by mouth daily., Disp: , Rfl:    BREO ELLIPTA 200-25 MCG/INH AEPB, INHALE 1 PUFF PO QD, Disp: , Rfl: 5   Brinzolamide-Brimonidine (SIMBRINZA) 1-0.2 % SUSP, Place 1 drop into the left eye in the morning and at bedtime., Disp: , Rfl:    Bromfenac Sodium 0.07 % SOLN, Place 1 drop into the left eye daily., Disp: , Rfl:     buPROPion (WELLBUTRIN XL) 150 MG 24 hr tablet, Take 1 tablet (150 mg total) by mouth daily., Disp: 90 tablet, Rfl: 3   clonazePAM (KLONOPIN) 0.5 MG tablet, TAKE 1 TABLET(0.5 MG) BY MOUTH DAILY AS NEEDED FOR ANXIETY, Disp: 30 tablet, Rfl: 1   Cyanocobalamin (B-12 PO), Take by mouth daily., Disp: , Rfl:    diclofenac Sodium (VOLTAREN) 1 % GEL, Apply 4 g topically 4 (four) times daily., Disp: 350 g, Rfl: 0   dicyclomine (BENTYL) 20 MG tablet, TAKE 1/2 TABLET BY MOUTH THREE TIMES DAILY BEFORE MEALS, Disp: 45 tablet, Rfl: 1   diltiazem (TIAZAC) 120 MG 24 hr capsule, TAKE 1 CAPSULE BY MOUTH DAILY, Disp: 90 capsule, Rfl: 3   dorzolamide (TRUSOPT) 2 % ophthalmic solution, Place into both eyes 2 (two) times daily. , Disp: , Rfl:    escitalopram (LEXAPRO) 10 MG tablet, TAKE 2 TABLETS(20 MG) BY MOUTH DAILY, Disp: 180 tablet, Rfl: 3   fluticasone (FLONASE) 50 MCG/ACT nasal spray, Place 1 spray into both nostrils daily., Disp: 15 mL, Rfl: 2   furosemide (LASIX) 40 MG tablet, TAKE 1 TABLET(40 MG) BY MOUTH DAILY, Disp: 90 tablet, Rfl: 0   gabapentin (NEURONTIN) 100 MG capsule, Take 100 mg by mouth at bedtime., Disp: , Rfl:    ibuprofen (ADVIL) 800 MG tablet, TAKE 1 TABLET(800 MG) BY MOUTH DAILY AS NEEDED FOR MODERATE PAIN (Patient not taking: Reported on 04/16/2021), Disp: 90 tablet, Rfl: 1   ipratropium (ATROVENT) 0.06 % nasal spray, Place 2 sprays into both nostrils 4 (four) times daily., Disp: 15 mL, Rfl: 0   latanoprost (XALATAN) 0.005 % ophthalmic solution, Place 1 drop into both eyes daily., Disp: 2.5 mL, Rfl: 11   levalbuterol (XOPENEX) 1.25 MG/3ML nebulizer solution, Take 1.25 mg by nebulization as needed., Disp: 72 mL, Rfl: 11   levocetirizine (XYZAL) 5 MG tablet, TK 1 T PO QD IN THE EVE, Disp: , Rfl: 6   losartan (COZAAR) 100 MG tablet, TAKE 1 TABLET BY MOUTH EVERY DAY, Disp: 90 tablet, Rfl: 1   multivitamin (THERAGRAN) tablet, Take 1 tablet by mouth daily., Disp: , Rfl:    omeprazole (PRILOSEC) 40 MG  capsule, TAKE 1 CAPSULE BY MOUTH EVERY DAY, Disp: 90 capsule, Rfl: 3   potassium chloride (KLOR-CON) 8 MEQ tablet, TAKE 1 TABLET(8 MEQ) BY MOUTH DAILY, Disp: 90 tablet, Rfl: 3   prednisoLONE acetate (PRED FORTE) 1 % ophthalmic suspension, Place 1 drop into the left eye 4 (four) times daily., Disp: , Rfl:    sodium chloride (OCEAN) 0.65 % SOLN nasal spray, Place 1 spray into both nostrils as needed for congestion., Disp: 44 mL, Rfl: 0   triamcinolone cream (KENALOG) 0.1 %, Apply 1 application topically 2 (two) times daily., Disp: 100 g, Rfl: 0  Observations/Objective: Patient is well-developed, well-nourished in no acute distress.  Resting comfortably at home.  Head is normocephalic, atraumatic.  No labored breathing. Speech is clear and coherent with logical content.  Patient is alert and oriented at baseline.   Assessment and Plan: 1. Acute bacterial bronchitis  Rx Azithromycin as she does not tolerate Doxycycline well and Augmentin is on back-order.  Increase fluids.  Rest.  Saline nasal spray.  Probiotic.  Mucinex as directed.  Humidifier in bedroom. Tessalon per orders. Continue Asthma medications.  Call or return to clinic if symptoms are not improving.   Follow Up Instructions: I discussed the assessment and treatment plan with the patient. The patient was provided an opportunity to ask questions and all were answered. The patient agreed with the plan and demonstrated an understanding of the instructions.  A copy of instructions were sent to the patient via MyChart unless otherwise noted below.   The patient was advised to call back or seek an in-person evaluation if the symptoms worsen or if the condition fails to improve as anticipated.  Time:  I spent 12 minutes with the patient via telehealth technology discussing the above problems/concerns.    Leeanne Rio, PA-C

## 2021-06-05 ENCOUNTER — Other Ambulatory Visit: Payer: Self-pay | Admitting: Internal Medicine

## 2021-06-14 DIAGNOSIS — H2000 Unspecified acute and subacute iridocyclitis: Secondary | ICD-10-CM | POA: Diagnosis not present

## 2021-06-14 DIAGNOSIS — H35352 Cystoid macular degeneration, left eye: Secondary | ICD-10-CM | POA: Diagnosis not present

## 2021-06-19 ENCOUNTER — Ambulatory Visit (INDEPENDENT_AMBULATORY_CARE_PROVIDER_SITE_OTHER): Payer: PPO

## 2021-06-19 ENCOUNTER — Other Ambulatory Visit: Payer: Self-pay

## 2021-06-19 DIAGNOSIS — Z0000 Encounter for general adult medical examination without abnormal findings: Secondary | ICD-10-CM

## 2021-06-19 NOTE — Progress Notes (Signed)
I connected with Dominique Johnston. Cappello today by telephone and verified that I am speaking with the correct person using two identifiers. Location patient: home Location provider: work Persons participating in the virtual visit: patient, provider.   I discussed the limitations, risks, security and privacy concerns of performing an evaluation and management service by telephone and the availability of in person appointments. I also discussed with the patient that there may be a patient responsible charge related to this service. The patient expressed understanding and verbally consented to this telephonic visit.    Interactive audio and video telecommunications were attempted between this provider and patient, however failed, due to patient having technical difficulties OR patient did not have access to video capability.  We continued and completed visit with audio only.  Some vital signs may be absent or patient reported.   Time Spent with patient on telephone encounter: 40 minutes  Subjective:   Dominique Johnston is a 76 y.o. female who presents for Medicare Annual (Subsequent) preventive examination.  Review of Systems     Cardiac Risk Factors include: advanced age (>60men, >53 women);family history of premature cardiovascular disease;hypertension     Objective:    There were no vitals filed for this visit. There is no height or weight on file to calculate BMI.  Advanced Directives 06/19/2021 05/09/2020 04/27/2019 04/01/2018 03/31/2017 10/08/2016 02/27/2015  Does Patient Have a Medical Advance Directive? No No No No No No No  Does patient want to make changes to medical advance directive? - - Yes (ED - Information included in AVS) Yes (ED - Information included in AVS) - - -  Would patient like information on creating a medical advance directive? Yes (MAU/Ambulatory/Procedural Areas - Information given) Yes (MAU/Ambulatory/Procedural Areas - Information given) - - Yes (ED -  Information included in AVS) - No - patient declined information    Current Medications (verified) Outpatient Encounter Medications as of 06/19/2021  Medication Sig   albuterol (PROVENTIL HFA;VENTOLIN HFA) 108 (90 Base) MCG/ACT inhaler Inhale 2 puffs into the lungs every 6 (six) hours as needed.   Ascorbic Acid (VITAMIN C PO) Take 1 tablet by mouth daily.   benzonatate (TESSALON) 100 MG capsule Take 1 capsule (100 mg total) by mouth 3 (three) times daily as needed for cough.   BREO ELLIPTA 200-25 MCG/INH AEPB INHALE 1 PUFF PO QD   Brinzolamide-Brimonidine (SIMBRINZA) 1-0.2 % SUSP Place 1 drop into the left eye in the morning and at bedtime.   Bromfenac Sodium 0.07 % SOLN Place 1 drop into the left eye daily.   buPROPion (WELLBUTRIN XL) 150 MG 24 hr tablet TAKE 1 TABLET(150 MG) BY MOUTH DAILY   clonazePAM (KLONOPIN) 0.5 MG tablet TAKE 1 TABLET(0.5 MG) BY MOUTH DAILY AS NEEDED FOR ANXIETY   Cyanocobalamin (B-12 PO) Take by mouth daily.   diclofenac Sodium (VOLTAREN) 1 % GEL Apply 4 g topically 4 (four) times daily.   dicyclomine (BENTYL) 20 MG tablet TAKE 1/2 TABLET BY MOUTH THREE TIMES DAILY BEFORE MEALS   diltiazem (TIAZAC) 120 MG 24 hr capsule TAKE 1 CAPSULE BY MOUTH DAILY   dorzolamide (TRUSOPT) 2 % ophthalmic solution Place into both eyes 2 (two) times daily.    escitalopram (LEXAPRO) 10 MG tablet TAKE 2 TABLETS(20 MG) BY MOUTH DAILY   fluticasone (FLONASE) 50 MCG/ACT nasal spray Place 1 spray into both nostrils daily.   furosemide (LASIX) 40 MG tablet TAKE 1 TABLET(40 MG) BY MOUTH DAILY   gabapentin (NEURONTIN) 100 MG capsule Take 100 mg  by mouth at bedtime.   ibuprofen (ADVIL) 800 MG tablet TAKE 1 TABLET(800 MG) BY MOUTH DAILY AS NEEDED FOR MODERATE PAIN (Patient not taking: Reported on 04/16/2021)   ipratropium (ATROVENT) 0.06 % nasal spray Place 2 sprays into both nostrils 4 (four) times daily.   latanoprost (XALATAN) 0.005 % ophthalmic solution Place 1 drop into both eyes daily.    levalbuterol (XOPENEX) 1.25 MG/3ML nebulizer solution Take 1.25 mg by nebulization as needed.   levocetirizine (XYZAL) 5 MG tablet TK 1 T PO QD IN THE EVE   losartan (COZAAR) 100 MG tablet TAKE 1 TABLET BY MOUTH EVERY DAY   multivitamin (THERAGRAN) tablet Take 1 tablet by mouth daily.   omeprazole (PRILOSEC) 40 MG capsule TAKE 1 CAPSULE BY MOUTH EVERY DAY   potassium chloride (KLOR-CON) 8 MEQ tablet TAKE 1 TABLET(8 MEQ) BY MOUTH DAILY   prednisoLONE acetate (PRED FORTE) 1 % ophthalmic suspension Place 1 drop into the left eye 4 (four) times daily.   sodium chloride (OCEAN) 0.65 % SOLN nasal spray Place 1 spray into both nostrils as needed for congestion.   triamcinolone cream (KENALOG) 0.1 % Apply 1 application topically 2 (two) times daily.   [DISCONTINUED] diltiazem (DILACOR XR) 120 MG 24 hr capsule Take 1 capsule (120 mg total) by mouth daily.   No facility-administered encounter medications on file as of 06/19/2021.    Allergies (verified) Enalapril maleate and Sulfamethoxazole-trimethoprim   History: Past Medical History:  Diagnosis Date   Allergy    rhinitis   Asthma    Bouchard nodes (DJD hand)    Bronchitis    recurrent   Cataract    Depression    DJD (degenerative joint disease) of knee    DJD (degenerative joint disease), lumbosacral    GERD (gastroesophageal reflux disease)    Headache(784.0)    Headache(784.0)    Hyperlipidemia    Hypertension    IBS (irritable bowel syndrome)    Sinusitis, chronic    Past Surgical History:  Procedure Laterality Date   BREAST EXCISIONAL BIOPSY Left 1998   CATARACT EXTRACTION Right    04/21/18   ORIF ANKLE FRACTURE  '07   Left ankle   SEPTOPLASTY  2000   TONSILLECTOMY     transnasal laryngoscopy  2005   TUBAL LIGATION  1977   Family History  Problem Relation Age of Onset   Hypertension Father    Coronary artery disease Father        MI/ Fatal   Breast cancer Neg Hx    Diabetes Neg Hx    Colon cancer Neg Hx    Social  History   Socioeconomic History   Marital status: Divorced    Spouse name: Not on file   Number of children: 2   Years of education: 36   Highest education level: Not on file  Occupational History    Employer: [employer]    Comment: retired  Tobacco Use   Smoking status: Never   Smokeless tobacco: Never  Vaping Use   Vaping Use: Never used  Substance and Sexual Activity   Alcohol use: Not Currently   Drug use: Not Currently   Sexual activity: Not Currently  Other Topics Concern   Not on file  Social History Narrative   Environmental consultant -BS, has had additional course work.  married '71- after 29 years husband left, divorce final in '06.  1 son- '74, 1 daughter '76; 2 grandchildren. 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 Lives in her own home, daughter and grandchild home with her. ACP - HCPOA dtr - tiffany (c) [phone] [id number]. CPR - ?, no mechanical ventilation except short term care, no prolonged artificial nutrition.    Social Determinants of Health   Financial Resource Strain: Low Risk    Difficulty of Paying Living Expenses: Not hard at all  Food Insecurity: No Food Insecurity   Worried About Charity fundraiser in the Last Year: Never true   [place] in the Last Year: Never true  Transportation Needs: No Transportation Needs   Lack of Transportation (Medical): No   Lack of Transportation (Non-Medical): No  Physical Activity: Inactive   Days of Exercise per Week: 0 days   Minutes of Exercise per Session: 0 min  Stress: No Stress Concern Present   Feeling of Stress : Not at all  Social Connections: Moderately Integrated   Frequency of Communication with Friends and Family: More than three times a week   Frequency of Social Gatherings with Friends and Family: Once a week   Attends Religious Services: 1 to 4 times per year   Active Member of [employer] or Organizations: No   Attends Music therapist: 1 to 4 times  per year   Marital Status: Divorced    Tobacco Counseling Counseling given: Not Answered   Clinical Intake:  Pre-visit preparation completed: Yes  Pain : No/denies pain     Nutritional Risks: None  How often do you need to have someone help you when you read instructions, pamphlets, or other written materials from your doctor or pharmacy?: 1 - Never What is the last grade level you completed in school?: Bachelor of Science Degree  Diabetic? no  Interpreter Needed?: No  Information entered by :: Lisette Abu, LPN   Activities of Daily Living In your present state of health, do you have any difficulty performing the following activities: 06/19/2021  Hearing? N  Vision? N  Difficulty concentrating or making decisions? N  Walking or climbing stairs? N  Dressing or bathing? N  Doing errands, shopping? N  Preparing Food and eating ? N  Using the Toilet? N  In the past six months, have you accidently leaked urine? Y  Comment sneezing episode or asthma; no daily protection  Do you have problems with loss of bowel control? N  Managing your Medications? N  Managing your Finances? N  Housekeeping or managing your Housekeeping? N  Some recent data might be hidden    Patient Care Team: Hoyt Koch, MD as PCP - General (Internal Medicine) Mosetta Anis, MD (Allergy) Romine, Lubertha South, MD (Obstetrics and Gynecology) Verner Chol, MD as Consulting Physician (Sports Medicine) Katy Apo, MD as Consulting Physician (Ophthalmology) Charlton Haws, [facility] as Pharmacist (Pharmacist)  Indicate any recent Medical Services you may have received from other than [facility] providers in the past year (date may be approximate).     Assessment:   This is a routine wellness examination for Dominique Johnston.  Hearing/Vision screen Hearing Screening - Comments:: Patient denied any hearing difficulty.   No hearing aids.  Vision Screening - Comments:: Patient wears  corrective glasses/contacts.  Eye exam done often for glaucoma by: Katy Apo, MD.  Dietary issues and exercise activities discussed: Current Exercise Habits: The patient does not participate in regular exercise at present, Exercise limited by: orthopedic condition(s)   Goals Addressed   None   Depression Screen [facility] 2/9 Scores 06/19/2021 05/09/2020  05/09/2020 04/27/2019 04/01/2018 03/31/2017 03/28/2016  PHQ - 2 Score 0 0 0 2 4 2  0  PHQ- 9 Score - - - 3 7 2  -    Fall Risk Fall Risk  06/19/2021 05/09/2020 04/27/2019 04/05/2019 04/01/2018  Falls in the past year? 0 1 0 1 1  Comment - - - Emmi Telephone Survey: data to providers prior to load -  Number falls in past yr: 0 0 0 1 0  Comment - - - Emmi Telephone Survey Actual Response = 2 -  Injury with Fall? 0 0 0 0 1  Risk for fall due to : No Fall Risks Other (Comment) Impaired balance/gait;Impaired mobility - -  Risk for fall due to: Comment - running behind cat - - -  Follow up Falls evaluation completed Falls evaluation completed Falls prevention discussed - Falls prevention discussed    FALL RISK PREVENTION PERTAINING TO THE HOME:  Any stairs in or around the home? No  If so, are there any without handrails? No  Home free of loose throw rugs in walkways, pet beds, electrical cords, etc? Yes  Adequate lighting in your home to reduce risk of falls? Yes   ASSISTIVE DEVICES UTILIZED TO PREVENT FALLS:  Life alert? No  Use of a cane, walker or w/c? No  Grab bars in the bathroom? No  Shower chair or bench in shower? No  Elevated toilet seat or a handicapped toilet? Yes   TIMED UP AND GO:  Was the test performed? No .  Length of time to ambulate 10 feet: n/a sec.   Gait steady and fast without use of assistive device  Cognitive Function: Normal cognitive status assessed by direct observation by this Nurse Health Advisor. No abnormalities found.   MMSE - Mini Mental State Exam 02/27/2015  Not completed: (No Data)         Immunizations Immunization History  Administered Date(s) Administered   Fluad Quad(high Dose 65+) 02/16/2019   Influenza Split 04/05/2011   Influenza Whole 04/22/2007, 02/23/2008, 02/21/2009, 03/06/2009, 02/28/2010, 01/26/2014   Influenza, High Dose Seasonal PF 03/06/2017   Influenza,inj,Quad PF,6+ Mos 01/17/2016   Influenza-Unspecified 02/21/2015, 02/10/2018, 03/13/2020, 03/04/2021   PFIZER(Purple Top)SARS-COV-2 Vaccination 07/28/2019, 08/25/2019, 03/27/2020, 03/04/2021   Pneumococcal Conjugate-13 03/16/2015   Pneumococcal Polysaccharide-23 05/29/2005, 04/05/2011, 06/19/2020   Tdap 05/03/2019   Zoster, Live 07/26/2009    TDAP status: Up to date  Flu Vaccine status: Up to date  Pneumococcal vaccine status: Up to date  Covid-19 vaccine status: Completed vaccines  Qualifies for Shingles Vaccine? Yes   Zostavax completed Yes   Shingrix Completed?: No.    Education has been provided regarding the importance of this vaccine. Patient has been advised to call insurance company to determine out of pocket expense if they have not yet received this vaccine. Advised may also receive vaccine at local pharmacy or Health Dept. Verbalized acceptance and understanding.  Screening Tests Health Maintenance  Topic Date Due   Zoster Vaccines- Shingrix (1 of 2) Never done   COVID-19 Vaccine (5 - Booster for Pfizer series) 04/29/2021   COLONOSCOPY (Pts 45-86yrs Insurance coverage will need to be confirmed)  12/25/2022   TETANUS/TDAP  05/02/2029   Pneumonia Vaccine 70+ Years old  Completed   INFLUENZA VACCINE  Completed   DEXA SCAN  Completed   Hepatitis C Screening  Completed   HPV VACCINES  Aged Out    Health Maintenance  Health Maintenance Due  Topic Date Due   Zoster Vaccines- Shingrix (1 of 2)  Never done   COVID-19 Vaccine (5 - Booster for Pfizer series) 04/29/2021    Colorectal cancer screening: Type of screening: Colonoscopy. Completed 12/24/2012. Repeat every 10  years  Mammogram status: Completed 10/27/2020. Repeat every year  Bone Density status: Completed 03/28/2016. Results reflect: Bone density results: NORMAL. Repeat every 5 years.  Lung Cancer Screening: (Low Dose CT Chest recommended if Age 77-80 years, 30 pack-year currently smoking OR have quit w/in 15years.) does not qualify.   Lung Cancer Screening Referral: no  Additional Screening:  Hepatitis C Screening: does qualify; Completed yes  Vision Screening: Recommended annual ophthalmology exams for early detection of glaucoma and other disorders of the eye. Is the patient up to date with their annual eye exam?  Yes  Who is the provider or what is the name of the office in which the patient attends annual eye exams? Katy Apo, MD. If pt is not established with a provider, would they like to be referred to a provider to establish care? No .   Dental Screening: Recommended annual dental exams for proper oral hygiene  Community Resource Referral / Chronic Care Management: CRR required this visit?  No   CCM required this visit?  No      Plan:     I have personally reviewed and noted the following in the patients chart:   Medical and social history Use of alcohol, tobacco or illicit drugs  Current medications and supplements including opioid prescriptions.  Functional ability and status Nutritional status Physical activity Advanced directives List of other physicians Hospitalizations, surgeries, and ER visits in previous 12 months Vitals Screenings to include cognitive, depression, and falls Referrals and appointments  In addition, I have reviewed and discussed with patient certain preventive protocols, quality metrics, and best practice recommendations. A written personalized care plan for preventive services as well as general preventive health recommendations were provided to patient.     Sheral Flow, LPN   [id number]   Nurse Notes:  Patient is cogitatively  intact.  Patient does brain stimulating exercises such as reading, crossword puzzles and listening to music. There were no vitals filed for this visit. There is no height or weight on file to calculate BMI. Patient stated that she has no issues with gait or balance; does not use any assistive devices. Medications reviewed with patient; no opioid use noted.

## 2021-06-19 NOTE — Patient Instructions (Addendum)
[name] , Thank you for taking time to come for your Medicare Wellness Visit. I appreciate your ongoing commitment to your health goals. Please review the following plan we discussed and let me know if I can assist you in the future.   Screening recommendations/referrals: Colonoscopy: 12/24/2012; due every 10 years (due 12/25/2022) Mammogram: 10/27/2020; due every year (due 10/27/2021) Bone Density: 03/28/2016; normal results Recommended yearly ophthalmology/optometry visit for glaucoma screening and checkup Recommended yearly dental visit for hygiene and checkup  Vaccinations: Influenza vaccine: 03/04/2021 Pneumococcal vaccine: 03/16/2015, 06/19/2020 Tdap vaccine: 05/03/2019; due every 10 years (due 05/02/2029) Shingles vaccine: never done   Covid-19: 07/18/2019, 08/25/2019, 03/27/2020, 12/12/2020, 03/04/2021  Advanced directives: Advance directive discussed with you today. I have provided a copy for you to complete at home and have notarized. Once this is complete please bring a copy in to our office so we can scan it into your chart.  Conditions/risks identified: Yes; Client understands the importance of follow-up with providers by attending scheduled visits and discussed goals to eat healthier, increase physical activity, exercise the brain, socialize more, get enough sleep and make time for laughter.  Next appointment: 06/20/2022 at 10:00 a.m. telephone visit with Susie Cassette, LPN.  If need to reschedule or cancel please call [phone].   Preventive Care 66 Years and Older, Female Preventive care refers to lifestyle choices and visits with your health care provider that can promote health and wellness. What does preventive care include? A yearly physical exam. This is also called an annual well check. Dental exams once or twice a year. Routine eye exams. Ask your health care provider how often you should have your eyes checked. Personal lifestyle choices, including: Daily care of  your teeth and gums. Regular physical activity. Eating a healthy diet. Avoiding tobacco and drug use. Limiting alcohol use. Practicing safe sex. Taking low-dose aspirin every day. Taking vitamin and mineral supplements as recommended by your health care provider. What happens during an annual well check? The services and screenings done by your health care provider during your annual well check will depend on your age, overall health, lifestyle risk factors, and family history of disease. Counseling  Your health care provider may ask you questions about your: Alcohol use. Tobacco use. Drug use. Emotional well-being. Home and relationship well-being. Sexual activity. Eating habits. History of falls. Memory and ability to understand (cognition). Work and work Astronomer. Reproductive health. Screening  You may have the following tests or measurements: Height, weight, and BMI. Blood pressure. Lipid and cholesterol levels. These may be checked every 5 years, or more frequently if you are over 34 years old. Skin check. Lung cancer screening. You may have this screening every year starting at age 25 if you have a 30-pack-year history of smoking and currently smoke or have quit within the past 15 years. Fecal occult blood test (FOBT) of the stool. You may have this test every year starting at age 50. Flexible sigmoidoscopy or colonoscopy. You may have a sigmoidoscopy every 5 years or a colonoscopy every 10 years starting at age 33. Hepatitis C blood test. Hepatitis B blood test. Sexually transmitted disease (STD) testing. Diabetes screening. This is done by checking your blood sugar (glucose) after you have not eaten for a while (fasting). You may have this done every 1-3 years. Bone density scan. This is done to screen for osteoporosis. You may have this done starting at age 53. Mammogram. This may be done every 1-2 years. Talk to your health care provider  about how often you should  have regular mammograms. Talk with your health care provider about your test results, treatment options, and if necessary, the need for more tests. Vaccines  Your health care provider may recommend certain vaccines, such as: Influenza vaccine. This is recommended every year. Tetanus, diphtheria, and acellular pertussis (Tdap, Td) vaccine. You may need a Td booster every 10 years. Zoster vaccine. You may need this after age 16. Pneumococcal 13-valent conjugate (PCV13) vaccine. One dose is recommended after age 59. Pneumococcal polysaccharide (PPSV23) vaccine. One dose is recommended after age 44. Talk to your health care provider about which screenings and vaccines you need and how often you need them. This information is not intended to replace advice given to you by your health care provider. Make sure you discuss any questions you have with your health care provider. Document Released: 05/26/2015 Document Revised: 01/17/2016 Document Reviewed: 02/28/2015 Elsevier Interactive Patient Education  2017 [employer].  Fall Prevention in the Home Falls can cause injuries. They can happen to people of all ages. There are many things you can do to make your home safe and to help prevent falls. What can I do on the outside of my home? Regularly fix the edges of walkways and driveways and fix any cracks. Remove anything that might make you trip as you walk through a door, such as a raised step or threshold. Trim any bushes or trees on the path to your home. Use bright outdoor lighting. Clear any walking paths of anything that might make someone trip, such as rocks or tools. Regularly check to see if handrails are loose or broken. Make sure that both sides of any steps have handrails. Any raised decks and porches should have guardrails on the edges. Have any leaves, snow, or ice cleared regularly. Use sand or salt on walking paths during winter. Clean up any spills in your garage right away. This  includes oil or grease spills. What can I do in the bathroom? Use night lights. Install grab bars by the toilet and in the tub and shower. Do not use towel bars as grab bars. Use non-skid mats or decals in the tub or shower. If you need to sit down in the shower, use a plastic, non-slip stool. Keep the floor dry. Clean up any water that spills on the floor as soon as it happens. Remove soap buildup in the tub or shower regularly. Attach bath mats securely with double-sided non-slip rug tape. Do not have throw rugs and other things on the floor that can make you trip. What can I do in the bedroom? Use night lights. Make sure that you have a light by your bed that is easy to reach. Do not use any sheets or blankets that are too big for your bed. They should not hang down onto the floor. Have a firm chair that has side arms. You can use this for support while you get dressed. Do not have throw rugs and other things on the floor that can make you trip. What can I do in the kitchen? Clean up any spills right away. Avoid walking on wet floors. Keep items that you use a lot in easy-to-reach places. If you need to reach something above you, use a strong step stool that has a grab bar. Keep electrical cords out of the way. Do not use floor polish or wax that makes floors slippery. If you must use wax, use non-skid floor wax. Do not have throw rugs and  other things on the floor that can make you trip. What can I do with my stairs? Do not leave any items on the stairs. Make sure that there are handrails on both sides of the stairs and use them. Fix handrails that are broken or loose. Make sure that handrails are as long as the stairways. Check any carpeting to make sure that it is firmly attached to the stairs. Fix any carpet that is loose or worn. Avoid having throw rugs at the top or bottom of the stairs. If you do have throw rugs, attach them to the floor with carpet tape. Make sure that you  have a light switch at the top of the stairs and the bottom of the stairs. If you do not have them, ask someone to add them for you. What else can I do to help prevent falls? Wear shoes that: Do not have high heels. Have rubber bottoms. Are comfortable and fit you well. Are closed at the toe. Do not wear sandals. If you use a stepladder: Make sure that it is fully opened. Do not climb a closed stepladder. Make sure that both sides of the stepladder are locked into place. Ask someone to hold it for you, if possible. Clearly mark and make sure that you can see: Any grab bars or handrails. First and last steps. Where the edge of each step is. Use tools that help you move around (mobility aids) if they are needed. These include: Canes. Walkers. Scooters. Crutches. Turn on the lights when you go into a dark area. Replace any light bulbs as soon as they burn out. Set up your furniture so you have a clear path. Avoid moving your furniture around. If any of your floors are uneven, fix them. If there are any pets around you, be aware of where they are. Review your medicines with your doctor. Some medicines can make you feel dizzy. This can increase your chance of falling. Ask your doctor what other things that you can do to help prevent falls. This information is not intended to replace advice given to you by your health care provider. Make sure you discuss any questions you have with your health care provider. Document Released: 02/23/2009 Document Revised: 10/05/2015 Document Reviewed: 06/03/2014 Elsevier Interactive Patient Education  2017 [employer].

## 2021-07-03 DIAGNOSIS — Z20822 Contact with and (suspected) exposure to covid-19: Secondary | ICD-10-CM | POA: Diagnosis not present

## 2021-07-04 ENCOUNTER — Encounter: Payer: PPO | Admitting: Internal Medicine

## 2021-07-04 ENCOUNTER — Telehealth: Payer: PPO | Admitting: Physician Assistant

## 2021-07-04 DIAGNOSIS — U071 COVID-19: Secondary | ICD-10-CM | POA: Diagnosis not present

## 2021-07-04 MED ORDER — MOLNUPIRAVIR EUA 200MG CAPSULE: 4.0000 | ORAL_CAPSULE | Freq: Two times a day (BID) | ORAL | 0 refills | Status: AC

## 2021-07-04 MED ORDER — BENZONATATE 100 MG PO CAPS
100.0000 mg | ORAL_CAPSULE | Freq: Three times a day (TID) | ORAL | 0 refills | Status: DC | PRN
Start: 2021-07-04 — End: 2021-07-19

## 2021-07-04 NOTE — Patient Instructions (Signed)
Dominique Johnston, thank you for joining Leeanne Rio, PA-C for today's virtual visit.  While this provider is not your primary care provider (PCP), if your PCP is located in our provider database this encounter information will be shared with them immediately following your visit.  Consent: (Patient) Dominique Johnston provided verbal consent for this virtual visit at the beginning of the encounter.  Current Medications:  Current Outpatient Medications:    benzonatate (TESSALON) 100 MG capsule, Take 1 capsule (100 mg total) by mouth 3 (three) times daily as needed for cough., Disp: 30 capsule, Rfl: 0   molnupiravir EUA (LAGEVRIO) 200 mg CAPS capsule, Take 4 capsules (800 mg total) by mouth 2 (two) times daily for 5 days., Disp: 40 capsule, Rfl: 0   albuterol (PROVENTIL HFA;VENTOLIN HFA) 108 (90 Base) MCG/ACT inhaler, Inhale 2 puffs into the lungs every 6 (six) hours as needed., Disp: 18 g, Rfl: 1   Ascorbic Acid (VITAMIN C PO), Take 1 tablet by mouth daily., Disp: , Rfl:    BREO ELLIPTA 200-25 MCG/INH AEPB, INHALE 1 PUFF PO QD, Disp: , Rfl: 5   Brinzolamide-Brimonidine (SIMBRINZA) 1-0.2 % SUSP, Place 1 drop into the left eye in the morning and at bedtime., Disp: , Rfl:    Bromfenac Sodium 0.07 % SOLN, Place 1 drop into the left eye daily., Disp: , Rfl:    buPROPion (WELLBUTRIN XL) 150 MG 24 hr tablet, TAKE 1 TABLET(150 MG) BY MOUTH DAILY, Disp: 90 tablet, Rfl: 3   clonazePAM (KLONOPIN) 0.5 MG tablet, TAKE 1 TABLET(0.5 MG) BY MOUTH DAILY AS NEEDED FOR ANXIETY, Disp: 30 tablet, Rfl: 1   Cyanocobalamin (B-12 PO), Take by mouth daily., Disp: , Rfl:    diclofenac Sodium (VOLTAREN) 1 % GEL, Apply 4 g topically 4 (four) times daily., Disp: 350 g, Rfl: 0   dicyclomine (BENTYL) 20 MG tablet, TAKE 1/2 TABLET BY MOUTH THREE TIMES DAILY BEFORE MEALS, Disp: 45 tablet, Rfl: 1   diltiazem (TIAZAC) 120 MG 24 hr capsule, TAKE 1 CAPSULE BY MOUTH DAILY, Disp: 90 capsule, Rfl: 3   dorzolamide  (TRUSOPT) 2 % ophthalmic solution, Place into both eyes 2 (two) times daily. , Disp: , Rfl:    escitalopram (LEXAPRO) 10 MG tablet, TAKE 2 TABLETS(20 MG) BY MOUTH DAILY, Disp: 180 tablet, Rfl: 3   fluticasone (FLONASE) 50 MCG/ACT nasal spray, Place 1 spray into both nostrils daily., Disp: 15 mL, Rfl: 2   furosemide (LASIX) 40 MG tablet, TAKE 1 TABLET(40 MG) BY MOUTH DAILY, Disp: 90 tablet, Rfl: 0   gabapentin (NEURONTIN) 100 MG capsule, Take 100 mg by mouth at bedtime., Disp: , Rfl:    ibuprofen (ADVIL) 800 MG tablet, TAKE 1 TABLET(800 MG) BY MOUTH DAILY AS NEEDED FOR MODERATE PAIN (Patient not taking: Reported on 04/16/2021), Disp: 90 tablet, Rfl: 1   ipratropium (ATROVENT) 0.06 % nasal spray, Place 2 sprays into both nostrils 4 (four) times daily., Disp: 15 mL, Rfl: 0   latanoprost (XALATAN) 0.005 % ophthalmic solution, Place 1 drop into both eyes daily., Disp: 2.5 mL, Rfl: 11   levalbuterol (XOPENEX) 1.25 MG/3ML nebulizer solution, Take 1.25 mg by nebulization as needed., Disp: 72 mL, Rfl: 11   levocetirizine (XYZAL) 5 MG tablet, TK 1 T PO QD IN THE EVE, Disp: , Rfl: 6   losartan (COZAAR) 100 MG tablet, TAKE 1 TABLET BY MOUTH EVERY DAY, Disp: 90 tablet, Rfl: 1   multivitamin (THERAGRAN) tablet, Take 1 tablet by mouth daily., Disp: , Rfl:  omeprazole (PRILOSEC) 40 MG capsule, TAKE 1 CAPSULE BY MOUTH EVERY DAY, Disp: 90 capsule, Rfl: 3   potassium chloride (KLOR-CON) 8 MEQ tablet, TAKE 1 TABLET(8 MEQ) BY MOUTH DAILY, Disp: 90 tablet, Rfl: 3   prednisoLONE acetate (PRED FORTE) 1 % ophthalmic suspension, Place 1 drop into the left eye 4 (four) times daily., Disp: , Rfl:    sodium chloride (OCEAN) 0.65 % SOLN nasal spray, Place 1 spray into both nostrils as needed for congestion., Disp: 44 mL, Rfl: 0   triamcinolone cream (KENALOG) 0.1 %, Apply 1 application topically 2 (two) times daily., Disp: 100 g, Rfl: 0   Medications ordered in this encounter:  Meds ordered this encounter  Medications    benzonatate (TESSALON) 100 MG capsule    Sig: Take 1 capsule (100 mg total) by mouth 3 (three) times daily as needed for cough.    Dispense:  30 capsule    Refill:  0    Order Specific Question:   Supervising Provider    Answer:   MILLER, BRIAN [[id number]]   molnupiravir EUA (LAGEVRIO) 200 mg CAPS capsule    Sig: Take 4 capsules (800 mg total) by mouth 2 (two) times daily for 5 days.    Dispense:  40 capsule    Refill:  0    Order Specific Question:   Supervising Provider    Answer:   Sabra Heck, [place]     *If you need refills on other medications prior to your next appointment, please contact your pharmacy*  Follow-Up: Call back or seek an in-person evaluation if the symptoms worsen or if the condition fails to improve as anticipated.  Other Instructions Please keep well-hydrated and get plenty of rest. Start a saline nasal rinse to flush out your nasal passages. You can use plain Mucinex to help thin congestion. If you have a humidifier, running in the bedroom at night. I want you to start OTC vitamin D3 1000 units daily, vitamin C 1000 mg daily, and a zinc supplement. Please take prescribed medications as directed.  You have been enrolled in a MyChart symptom monitoring program. Please answer these questions daily so we can keep track of how you are doing.  You were to quarantine for 5 days from onset of your symptoms.  After day 5, if you have had no fever and you are feeling better, you can end quarantine but need to mask for an additional 5 days. After day 5 if you have a fever or are having significant symptoms, please quarantine for full 10 days.  If you note any worsening of symptoms, any significant shortness of breath or any chest pain, please seek ER evaluation ASAP.  Please do not delay care!  COVID-19: What to Do if You Are Sick If you test positive and are an older adult or someone who is at high risk of getting very sick from COVID-19, treatment may be available.  Contact a healthcare provider right away after a positive test to determine if you are eligible, even if your symptoms are mild right now. You can also visit a Test to Treat location and, if eligible, receive a prescription from a provider. Don't delay: Treatment must be started within the first few days to be effective. If you have a fever, cough, or other symptoms, you might have COVID-19. Most people have mild illness and are able to recover at home. If you are sick: Keep track of your symptoms. If you have an emergency warning sign (including trouble  breathing), call 911. Steps to help prevent the spread of COVID-19 if you are sick If you are sick with COVID-19 or think you might have COVID-19, follow the steps below to care for yourself and to help protect other people in your home and community. Stay home except to get medical care Stay home. Most people with COVID-19 have mild illness and can recover at home without medical care. Do not leave your home, except to get medical care. Do not visit public areas and do not go to places where you are unable to wear a mask. Take care of yourself. Get rest and stay hydrated. Take over-the-counter medicines, such as acetaminophen, to help you feel better. Stay in touch with your doctor. Call before you get medical care. Be sure to get care if you have trouble breathing, or have any other emergency warning signs, or if you think it is an emergency. Avoid public transportation, ride-sharing, or taxis if possible. Get tested If you have symptoms of COVID-19, get tested. While waiting for test results, stay away from others, including staying apart from those living in your household. Get tested as soon as possible after your symptoms start. Treatments may be available for people with COVID-19 who are at risk for becoming very sick. Don't delay: Treatment must be started early to be effective--some treatments must begin within 5 days of your first symptoms.  Contact your healthcare provider right away if your test result is positive to determine if you are eligible. Self-tests are one of several options for testing for the virus that causes COVID-19 and may be more convenient than laboratory-based tests and point-of-care tests. Ask your healthcare provider or your local health department if you need help interpreting your test results. You can visit your state, tribal, local, and territorial health department's website to look for the latest local information on testing sites. Separate yourself from other people As much as possible, stay in a specific room and away from other people and pets in your home. If possible, you should use a separate bathroom. If you need to be around other people or animals in or outside of the home, wear a well-fitting mask. Tell your close contacts that they may have been exposed to COVID-19. An infected person can spread COVID-19 starting 48 hours (or 2 days) before the person has any symptoms or tests positive. By letting your close contacts know they may have been exposed to COVID-19, you are helping to protect everyone. See COVID-19 and Animals if you have questions about pets. If you are diagnosed with COVID-19, someone from the health department may call you. Answer the call to slow the spread. Monitor your symptoms Symptoms of COVID-19 include fever, cough, or other symptoms. Follow care instructions from your healthcare provider and local health department. Your local health authorities may give instructions on checking your symptoms and reporting information. When to seek emergency medical attention Look for emergency warning signs* for COVID-19. If someone is showing any of these signs, seek emergency medical care immediately: Trouble breathing Persistent pain or pressure in the chest New confusion Inability to wake or stay awake Pale, gray, or blue-colored skin, lips, or nail beds, depending on skin tone *This  list is not all possible symptoms. Please call your medical provider for any other symptoms that are severe or concerning to you. Call 911 or call ahead to your local emergency facility: Notify the operator that you are seeking care for someone who has or may have COVID-19. Call  ahead before visiting your doctor Call ahead. Many medical visits for routine care are being postponed or done by phone or telemedicine. If you have a medical appointment that cannot be postponed, call your doctor's office, and tell them you have or may have COVID-19. This will help the office protect themselves and other patients. If you are sick, wear a well-fitting mask You should wear a mask if you must be around other people or animals, including pets (even at home). Wear a mask with the best fit, protection, and comfort for you. You don't need to wear the mask if you are alone. If you can't put on a mask (because of trouble breathing, for example), cover your coughs and sneezes in some other way. Try to stay at least 6 feet away from other people. This will help protect the people around you. Masks should not be placed on young children under age 72 years, anyone who has trouble breathing, or anyone who is not able to remove the mask without help. Cover your coughs and sneezes Cover your mouth and nose with a tissue when you cough or sneeze. Throw away used tissues in a lined trash can. Immediately wash your hands with soap and water for at least 20 seconds. If soap and water are not available, clean your hands with an alcohol-based hand sanitizer that contains at least 60% alcohol. Clean your hands often Wash your hands often with soap and water for at least 20 seconds. This is especially important after blowing your nose, coughing, or sneezing; going to the bathroom; and before eating or preparing food. Use hand sanitizer if soap and water are not available. Use an alcohol-based hand sanitizer with at least 60%  alcohol, covering all surfaces of your hands and rubbing them together until they feel dry. Soap and water are the best option, especially if hands are visibly dirty. Avoid touching your eyes, nose, and mouth with unwashed hands. Handwashing Tips Avoid sharing personal household items Do not share dishes, drinking glasses, cups, eating utensils, towels, or bedding with other people in your home. Wash these items thoroughly after using them with soap and water or put in the dishwasher. Clean surfaces in your home regularly Clean and disinfect high-touch surfaces (for example, doorknobs, tables, handles, light switches, and countertops) in your "sick room" and bathroom. In shared spaces, you should clean and disinfect surfaces and items after each use by the person who is ill. If you are sick and cannot clean, a caregiver or other person should only clean and disinfect the area around you (such as your bedroom and bathroom) on an as needed basis. Your caregiver/other person should wait as long as possible (at least several hours) and wear a mask before entering, cleaning, and disinfecting shared spaces that you use. Clean and disinfect areas that may have blood, stool, or body fluids on them. Use household cleaners and disinfectants. Clean visible dirty surfaces with household cleaners containing soap or detergent. Then, use a household disinfectant. Use a product from [employer] List N: Disinfectants for Coronavirus ([id number]). Be sure to follow the instructions on the label to ensure safe and effective use of the product. Many products recommend keeping the surface wet with a disinfectant for a certain period of time (look at "contact time" on the product label). You may also need to wear personal protective equipment, such as gloves, depending on the directions on the product label. Immediately after disinfecting, wash your hands with soap and water for 20 seconds. For  completed guidance on cleaning and  disinfecting your home, visit Complete Disinfection Guidance. Take steps to improve ventilation at home Improve ventilation (air flow) at home to help prevent from spreading COVID-19 to other people in your household. Clear out COVID-19 virus particles in the air by opening windows, using air filters, and turning on fans in your home. Use this interactive tool to learn how to improve air flow in your home. When you can be around others after being sick with COVID-19 Deciding when you can be around others is different for different situations. Find out when you can safely end home isolation. For any additional questions about your care, contact your healthcare provider or state or local health department. 08/01/2020 Content source: [facility] for Immunization and Respiratory Diseases ([facility]), Division of Viral Diseases This information is not intended to replace advice given to you by your health care provider. Make sure you discuss any questions you have with your health care provider. Document Revised: 09/14/2020 Document Reviewed: 09/14/2020 Elsevier Patient Education  2022 [employer].      If you have been instructed to have an in-person evaluation today at a local Urgent Care facility, please use the link below. It will take you to a list of all of our available [facility], including address, phone number and hours of operation. Please do not delay care.  [facility]  If you or a family member do not have a primary care provider, use the link below to schedule a visit and establish care. When you choose a [facility] primary care physician or advanced practice provider, you gain a long-term partner in health. Find a Primary Care Provider  Learn more about [facility]'s in-office and virtual care options: [facility]

## 2021-07-04 NOTE — Progress Notes (Signed)
Virtual Visit Consent   Dominique Johnston, you are scheduled for a virtual visit with a [facility] provider today.     Just as with appointments in the office, your consent must be obtained to participate.  Your consent will be active for this visit and any virtual visit you may have with one of our providers in the next 365 days.     If you have a MyChart account, a copy of this consent can be sent to you electronically.  All virtual visits are billed to your insurance company just like a traditional visit in the office.    As this is a virtual visit, video technology does not allow for your provider to perform a traditional examination.  This may limit your provider's ability to fully assess your condition.  If your provider identifies any concerns that need to be evaluated in person or the need to arrange testing (such as labs, EKG, etc.), we will make arrangements to do so.     Although advances in technology are sophisticated, we cannot ensure that it will always work on either your end or our end.  If the connection with a video visit is poor, the visit may have to be switched to a telephone visit.  With either a video or telephone visit, we are not always able to ensure that we have a secure connection.     I need to obtain your verbal consent now.   Are you willing to proceed with your visit today?    Dominique Johnston has provided verbal consent on 07/04/2021 for a virtual visit (video or telephone).   Dominique Johnston, [place]   Date: 07/04/2021 5:18 PM   Virtual Visit via Video Note   I, Dominique Johnston, connected with  Dominique Johnston  ([id number], 1946-01-12) on 07/04/21 at  5:15 PM EST by a video-enabled telemedicine application and verified that I am speaking with the correct person using two identifiers.  Location: Patient: Virtual Visit Location Patient: Home Provider: Virtual Visit Location Provider: Home Office   I discussed the limitations of  evaluation and management by telemedicine and the availability of in person appointments. The patient expressed understanding and agreed to proceed.    History of Present Illness: Dominique Johnston is a 76 y.o. who identifies as a female who was assigned female at birth, and is being seen today for COVID-19. Endorses symptoms starting nasal and head congestion, cough (mainly dry), headache and low-grade fever. Feels this started this past weekend but with mild symptoms but thought was related to her allergies. Denies SOB or chest pain. Has noted some wheezing and has been taking her regular asthma medications as directed without need for increased SABA use. Has also continued with her allergy nasal sprays.   HPI: HPI  Problems:  Patient Active Problem List   Diagnosis Date Noted   Rash 01/10/2021   Left hip pain 03/22/2019   Mild intermittent asthma 10/08/2016   Routine health maintenance 10/25/2012   Borderline high cholesterol 01/19/2010   G E REFLUX 12/10/2007   Generalized anxiety disorder 12/04/2007   Essential hypertension 12/01/2006   Allergic rhinitis 12/01/2006   IRRITABLE BOWEL SYNDROME 12/01/2006   Osteoarthritis 12/01/2006    Allergies:  Allergies  Allergen Reactions   Enalapril Maleate     REACTION: swelling, headache   Sulfamethoxazole-Trimethoprim     Abdominal pain, redness eyes   Medications:  Current Outpatient Medications:    benzonatate (TESSALON) 100 MG capsule, Take  1 capsule (100 mg total) by mouth 3 (three) times daily as needed for cough., Disp: 30 capsule, Rfl: 0   molnupiravir EUA (LAGEVRIO) 200 mg CAPS capsule, Take 4 capsules (800 mg total) by mouth 2 (two) times daily for 5 days., Disp: 40 capsule, Rfl: 0   albuterol (PROVENTIL HFA;VENTOLIN HFA) 108 (90 Base) MCG/ACT inhaler, Inhale 2 puffs into the lungs every 6 (six) hours as needed., Disp: 18 g, Rfl: 1   Ascorbic Acid (VITAMIN C PO), Take 1 tablet by mouth daily., Disp: , Rfl:    BREO ELLIPTA  200-25 MCG/INH AEPB, INHALE 1 PUFF PO QD, Disp: , Rfl: 5   Brinzolamide-Brimonidine (SIMBRINZA) 1-0.2 % SUSP, Place 1 drop into the left eye in the morning and at bedtime., Disp: , Rfl:    Bromfenac Sodium 0.07 % SOLN, Place 1 drop into the left eye daily., Disp: , Rfl:    buPROPion (WELLBUTRIN XL) 150 MG 24 hr tablet, TAKE 1 TABLET(150 MG) BY MOUTH DAILY, Disp: 90 tablet, Rfl: 3   clonazePAM (KLONOPIN) 0.5 MG tablet, TAKE 1 TABLET(0.5 MG) BY MOUTH DAILY AS NEEDED FOR ANXIETY, Disp: 30 tablet, Rfl: 1   Cyanocobalamin (B-12 PO), Take by mouth daily., Disp: , Rfl:    diclofenac Sodium (VOLTAREN) 1 % GEL, Apply 4 g topically 4 (four) times daily., Disp: 350 g, Rfl: 0   dicyclomine (BENTYL) 20 MG tablet, TAKE 1/2 TABLET BY MOUTH THREE TIMES DAILY BEFORE MEALS, Disp: 45 tablet, Rfl: 1   diltiazem (TIAZAC) 120 MG 24 hr capsule, TAKE 1 CAPSULE BY MOUTH DAILY, Disp: 90 capsule, Rfl: 3   dorzolamide (TRUSOPT) 2 % ophthalmic solution, Place into both eyes 2 (two) times daily. , Disp: , Rfl:    escitalopram (LEXAPRO) 10 MG tablet, TAKE 2 TABLETS(20 MG) BY MOUTH DAILY, Disp: 180 tablet, Rfl: 3   fluticasone (FLONASE) 50 MCG/ACT nasal spray, Place 1 spray into both nostrils daily., Disp: 15 mL, Rfl: 2   furosemide (LASIX) 40 MG tablet, TAKE 1 TABLET(40 MG) BY MOUTH DAILY, Disp: 90 tablet, Rfl: 0   gabapentin (NEURONTIN) 100 MG capsule, Take 100 mg by mouth at bedtime., Disp: , Rfl:    ibuprofen (ADVIL) 800 MG tablet, TAKE 1 TABLET(800 MG) BY MOUTH DAILY AS NEEDED FOR MODERATE PAIN (Patient not taking: Reported on 04/16/2021), Disp: 90 tablet, Rfl: 1   ipratropium (ATROVENT) 0.06 % nasal spray, Place 2 sprays into both nostrils 4 (four) times daily., Disp: 15 mL, Rfl: 0   latanoprost (XALATAN) 0.005 % ophthalmic solution, Place 1 drop into both eyes daily., Disp: 2.5 mL, Rfl: 11   levalbuterol (XOPENEX) 1.25 MG/3ML nebulizer solution, Take 1.25 mg by nebulization as needed., Disp: 72 mL, Rfl: 11   levocetirizine  (XYZAL) 5 MG tablet, TK 1 T PO QD IN THE EVE, Disp: , Rfl: 6   losartan (COZAAR) 100 MG tablet, TAKE 1 TABLET BY MOUTH EVERY DAY, Disp: 90 tablet, Rfl: 1   multivitamin (THERAGRAN) tablet, Take 1 tablet by mouth daily., Disp: , Rfl:    omeprazole (PRILOSEC) 40 MG capsule, TAKE 1 CAPSULE BY MOUTH EVERY DAY, Disp: 90 capsule, Rfl: 3   potassium chloride (KLOR-CON) 8 MEQ tablet, TAKE 1 TABLET(8 MEQ) BY MOUTH DAILY, Disp: 90 tablet, Rfl: 3   prednisoLONE acetate (PRED FORTE) 1 % ophthalmic suspension, Place 1 drop into the left eye 4 (four) times daily., Disp: , Rfl:    sodium chloride (OCEAN) 0.65 % SOLN nasal spray, Place 1 spray into both nostrils as  needed for congestion., Disp: 44 mL, Rfl: 0   triamcinolone cream (KENALOG) 0.1 %, Apply 1 application topically 2 (two) times daily., Disp: 100 g, Rfl: 0  Observations/Objective: Patient is well-developed, well-nourished in no acute distress.  Resting comfortably at home.  Head is normocephalic, atraumatic.  No labored breathing. Speech is clear and coherent with logical content.  Patient is alert and oriented at baseline.   Assessment and Plan: 1. COVID-19 - benzonatate (TESSALON) 100 MG capsule; Take 1 capsule (100 mg total) by mouth 3 (three) times daily as needed for cough.  Dispense: 30 capsule; Refill: 0 - MyChart COVID-19 home monitoring program; Future - molnupiravir EUA (LAGEVRIO) 200 mg CAPS capsule; Take 4 capsules (800 mg total) by mouth 2 (two) times daily for 5 days.  Dispense: 40 capsule; Refill: 0  Patient with multiple risk factors for complicated course of illness. Discussed risks/benefits of antiviral medications including most common potential ADRs. Patient voiced understanding and would like to proceed with antiviral medication. They are candidate for molnupiravir. Rx sent to pharmacy. Supportive measures, OTC medications and vitamin regimen reviewed. Tessalon per orders. Patient has been enrolled in a MyChart COVID symptom  monitoring program. Samule Dry reviewed in detail. Strict ER precautions discussed with patient.    Follow Up Instructions: I discussed the assessment and treatment plan with the patient. The patient was provided an opportunity to ask questions and all were answered. The patient agreed with the plan and demonstrated an understanding of the instructions.  A copy of instructions were sent to the patient via MyChart unless otherwise noted below.   The patient was advised to call back or seek an in-person evaluation if the symptoms worsen or if the condition fails to improve as anticipated.  Time:  I spent 10 minutes with the patient via telehealth technology discussing the above problems/concerns.    Dominique Rio, PA-C

## 2021-07-05 ENCOUNTER — Other Ambulatory Visit: Payer: Self-pay | Admitting: Internal Medicine

## 2021-07-18 DIAGNOSIS — Z20822 Contact with and (suspected) exposure to covid-19: Secondary | ICD-10-CM | POA: Diagnosis not present

## 2021-07-19 ENCOUNTER — Ambulatory Visit (INDEPENDENT_AMBULATORY_CARE_PROVIDER_SITE_OTHER): Payer: PPO | Admitting: Internal Medicine

## 2021-07-19 ENCOUNTER — Other Ambulatory Visit: Payer: Self-pay

## 2021-07-19 ENCOUNTER — Encounter: Payer: Self-pay | Admitting: Internal Medicine

## 2021-07-19 VITALS — BP 126/74 | HR 78 | Resp 18 | Ht 66.0 in | Wt 182.0 lb

## 2021-07-19 DIAGNOSIS — I10 Essential (primary) hypertension: Secondary | ICD-10-CM

## 2021-07-19 DIAGNOSIS — Z0000 Encounter for general adult medical examination without abnormal findings: Secondary | ICD-10-CM

## 2021-07-19 DIAGNOSIS — E789 Disorder of lipoprotein metabolism, unspecified: Secondary | ICD-10-CM | POA: Diagnosis not present

## 2021-07-19 DIAGNOSIS — K219 Gastro-esophageal reflux disease without esophagitis: Secondary | ICD-10-CM

## 2021-07-19 DIAGNOSIS — F411 Generalized anxiety disorder: Secondary | ICD-10-CM

## 2021-07-19 DIAGNOSIS — J4520 Mild intermittent asthma, uncomplicated: Secondary | ICD-10-CM

## 2021-07-19 DIAGNOSIS — K589 Irritable bowel syndrome without diarrhea: Secondary | ICD-10-CM

## 2021-07-19 LAB — CBC
HCT: 36.9 % (ref 36.0–46.0)
Hemoglobin: 11.5 g/dL — ABNORMAL LOW (ref 12.0–15.0)
MCHC: 31.3 g/dL (ref 30.0–36.0)
MCV: 74.9 fl — ABNORMAL LOW (ref 78.0–100.0)
Platelets: 191 10*3/uL (ref 150.0–400.0)
RBC: 4.93 Mil/uL (ref 3.87–5.11)
RDW: 15.3 % (ref 11.5–15.5)
WBC: 4.5 10*3/uL (ref 4.0–10.5)

## 2021-07-19 LAB — LIPID PANEL
Cholesterol: 167 mg/dL (ref 0–200)
HDL: 60.8 mg/dL (ref >39.00)
LDL Cholesterol: 97 mg/dL (ref 0–99)
NonHDL: 106.12
Total CHOL/HDL Ratio: 3
Triglycerides: 48 mg/dL (ref 0.0–149.0)
VLDL: 9.6 mg/dL (ref 0.0–40.0)

## 2021-07-19 LAB — COMPREHENSIVE METABOLIC PANEL
ALT: 12 U/L (ref 0–35)
AST: 19 U/L (ref 0–37)
Albumin: 3.8 g/dL (ref 3.5–5.2)
Alkaline Phosphatase: 77 U/L (ref 39–117)
BUN: 12 mg/dL (ref 6–23)
CO2: 28 mEq/L (ref 19–32)
Calcium: 9.1 mg/dL (ref 8.4–10.5)
Chloride: 107 mEq/L (ref 96–112)
Creatinine, Ser: 1.08 mg/dL (ref 0.40–1.20)
GFR: 50.23 mL/min — ABNORMAL LOW (ref >60.00)
Glucose, Bld: 82 mg/dL (ref 70–99)
Potassium: 4.1 mEq/L (ref 3.5–5.1)
Sodium: 143 mEq/L (ref 135–145)
Total Bilirubin: 0.5 mg/dL (ref 0.2–1.2)
Total Protein: 7.1 g/dL (ref 6.0–8.3)

## 2021-07-19 MED ORDER — DICYCLOMINE HCL 20 MG PO TABS: 20.0000 mg | ORAL_TABLET | Freq: Three times a day (TID) | ORAL | 1 refills | Status: DC

## 2021-07-19 NOTE — Progress Notes (Signed)
? ?  Subjective:  ? ?Patient ID: Dominique Johnston, female    DOB: [birth year], 76 y.o.   MRN: [id number] ? ?HPI ?The patient is here for physical. ? ?PMH, [facility], social history reviewed and updated ? ?Review of Systems  ?Constitutional: Negative.   ?HENT: Negative.    ?Eyes: Negative.   ?Respiratory:  Negative for cough, chest tightness and shortness of breath.   ?Cardiovascular:  Negative for chest pain, palpitations and leg swelling.  ?Gastrointestinal:  Negative for abdominal distention, abdominal pain, constipation, diarrhea, nausea and vomiting.  ?Musculoskeletal: Negative.   ?Skin: Negative.   ?Neurological: Negative.   ?Psychiatric/Behavioral: Negative.    ? ?Objective:  ?Physical Exam ?Constitutional:   ?   Appearance: She is well-developed.  ?HENT:  ?   Head: Normocephalic and atraumatic.  ?Cardiovascular:  ?   Rate and Rhythm: Normal rate and regular rhythm.  ?Pulmonary:  ?   Effort: Pulmonary effort is normal. No respiratory distress.  ?   Breath sounds: Wheezing present. No rales.  ?   Comments: Minimal wheezing ?Abdominal:  ?   General: Bowel sounds are normal. There is no distension.  ?   Palpations: Abdomen is soft.  ?   Tenderness: There is no abdominal tenderness. There is no rebound.  ?Musculoskeletal:  ?   Cervical back: Normal range of motion.  ?Skin: ?   General: Skin is warm and dry.  ?Neurological:  ?   Mental Status: She is alert and oriented to person, place, and time.  ?   Coordination: Coordination normal.  ? ? ?Vitals:  ? 07/19/21 1040  ?BP: 126/74  ?Pulse: 78  ?Resp: 18  ?SpO2: 98%  ?Weight: 182 lb (82.6 kg)  ?Height: 5\' 6"  (1.676 m)  ? ? ?This visit occurred during the SARS-CoV-2 public health emergency.  Safety protocols were in place, including screening questions prior to the visit, additional usage of staff PPE, and extensive cleaning of exam room while observing appropriate contact time as indicated for disinfecting solutions.  ? ?Assessment & Plan:  ? ? ?

## 2021-07-20 NOTE — Assessment & Plan Note (Signed)
Taking omeprazole 40 mg daily and will continue.  ?

## 2021-07-20 NOTE — Assessment & Plan Note (Signed)
Flu shot up to date. Covid-19 up to date. Pneumonia complete. Shingrix counseled to get at pharmacy. Tetanus due 2030. Colonoscopy due 2024. Mammogram due 2024, pap smear aged out and dexa complete. Counseled about sun safety and mole surveillance. Counseled about the dangers of distracted driving. Given 10 year screening recommendations.  ? ?

## 2021-07-20 NOTE — Assessment & Plan Note (Signed)
Refilled bentyl today which she uses rarely for symptoms. They can be triggered by anxiety or diet. Overall well controlled.  ?

## 2021-07-20 NOTE — Assessment & Plan Note (Addendum)
Taking wellbtrin 150 mg daily and lexapro 20 mg daily and using clonazepam prn for anxiety. This is controlling symptoms and we will continue as is.  ?

## 2021-07-20 NOTE — Assessment & Plan Note (Signed)
Minimal wheezing on exam after covid-19 in Feb 2023. She is not having SOB and feels improved. She has not used breo yet today which she typically does. Continue breo 200/25 daily and albuteorl prn.  ?

## 2021-07-20 NOTE — Assessment & Plan Note (Signed)
BP at goal on diltiazem 120 mg daily and lasix 40 mg daily and losartan 100 mg daily. Checking CMP and adjust as needed.  ?

## 2021-07-20 NOTE — Assessment & Plan Note (Signed)
Checking lipid panel controlled with diet. Adjust as needed.  ?

## 2021-07-25 ENCOUNTER — Encounter: Payer: Self-pay | Admitting: Internal Medicine

## 2021-08-06 ENCOUNTER — Other Ambulatory Visit: Payer: Self-pay | Admitting: Internal Medicine

## 2021-08-13 ENCOUNTER — Ambulatory Visit (HOSPITAL_COMMUNITY)
Admission: EM | Admit: 2021-08-13 | Discharge: 2021-08-13 | Disposition: A | Discharge status: Home / Self Care | Payer: PPO | Attending: Nurse Practitioner | Admitting: Nurse Practitioner

## 2021-08-13 ENCOUNTER — Encounter (HOSPITAL_COMMUNITY): Payer: Self-pay | Admitting: Emergency Medicine

## 2021-08-13 ENCOUNTER — Other Ambulatory Visit: Payer: Self-pay

## 2021-08-13 DIAGNOSIS — Z7951 Long term (current) use of inhaled steroids: Secondary | ICD-10-CM | POA: Insufficient documentation

## 2021-08-13 DIAGNOSIS — J069 Acute upper respiratory infection, unspecified: Secondary | ICD-10-CM | POA: Diagnosis not present

## 2021-08-13 DIAGNOSIS — J309 Allergic rhinitis, unspecified: Secondary | ICD-10-CM | POA: Diagnosis not present

## 2021-08-13 DIAGNOSIS — J4521 Mild intermittent asthma with (acute) exacerbation: Secondary | ICD-10-CM | POA: Insufficient documentation

## 2021-08-13 DIAGNOSIS — Z20822 Contact with and (suspected) exposure to covid-19: Secondary | ICD-10-CM | POA: Insufficient documentation

## 2021-08-13 DIAGNOSIS — R051 Acute cough: Secondary | ICD-10-CM | POA: Insufficient documentation

## 2021-08-13 LAB — POC INFLUENZA A AND B ANTIGEN (URGENT CARE ONLY)
INFLUENZA A ANTIGEN, POC: NEGATIVE
INFLUENZA B ANTIGEN, POC: NEGATIVE

## 2021-08-13 MED ORDER — METHYLPREDNISOLONE SODIUM SUCC 125 MG IJ SOLR
INTRAMUSCULAR | Status: AC
  Filled 2021-08-13: qty 2

## 2021-08-13 MED ORDER — PREDNISONE 20 MG PO TABS: 40.0000 mg | ORAL_TABLET | Freq: Every day | ORAL | 0 refills | Status: AC

## 2021-08-13 MED ORDER — METHYLPREDNISOLONE SODIUM SUCC 125 MG IJ SOLR
60.0000 mg | Freq: Once | INTRAMUSCULAR | Status: AC
  Administered 2021-08-13: 60 mg via INTRAMUSCULAR

## 2021-08-13 NOTE — Discharge Instructions (Addendum)
-   We have given you an injection of Solu-Medrol today (steroid to help with the wheezing)  ?- We will let you know if the COVID or flu testing come back positive  ?- If the wheezing has not improved tomorrow, please start the oral prednisone 40 mg daily for 5 days ? - Your symptoms and exam findings are most consistent with a viral upper respiratory infection. These usually run their course in 5-7 days. If your symptoms last longer than 10 days and/or you start feeling worse, please seek care.  ? ?Some things that can make you feel better are: ?- Increased rest ?- Increasing fluid with water/sugar free electrolytes ?- Acetaminophen as needed for fever/pain.  ?- Salt water gargling, chloraseptic spray and throat lozenges ?- OTC guaifenesin (Mucinex).  ?- Saline sinus flushes or a neti pot.  ?- Humidifying the air. ? ?

## 2021-08-13 NOTE — ED Notes (Signed)
Covid and flu swab obtained, labeled verified and placed in lab ?

## 2021-08-13 NOTE — ED Provider Notes (Signed)
?[facility] ? ? ? ?CSN: [id number] ?Arrival date & time: 08/13/21  [id number] ? ? ?  ? ?History   ?Chief Complaint ?Chief Complaint  ?Patient presents with  ? Cough  ? ? ?HPI ?Dominique Johnston is a 76 y.o. female.  ? ?Patient presents to urgent care with 4 days of congested cough, shortness of breath with cough, wheezing with cough, low-grade subjective fever, nasal congestion, chest congestion, runny nose, postnasal drip, sneezing, sore throat, swollen glands, ear pressure, nausea, and increased fatigue.  Patient denies chest pain or tightness, sinus pressure, headache, tooth pain, ear drainage, eye redness or drainage, vomiting/diarrhea, change in appetite, new rash.  She denies any recent sick contacts.  She reports her symptoms have worsened slightly.  She has been increasing fluids, using cough syrup and cough tablets, and nasal saline rinses without relief of symptoms. ? ?Patient reports history of asthma-takes Breo daily and has been using albuterol inhaler more frequently which does help with the shortness of breath and wheezing.  She also has a history of allergic rhinitis and takes an oral antihistamine and intranasal corticosteroid.  Patient had COVID-19 approximately 6 weeks ago.  Earlier this year, she was treated for acute bacterial bronchitis with a Z-Pak. ? ? ?Past Medical History:  ?Diagnosis Date  ? Allergy   ? rhinitis  ? Asthma   ? Bouchard nodes (DJD hand)   ? Bronchitis   ? recurrent  ? Cataract   ? Depression   ? DJD (degenerative joint disease) of knee   ? DJD (degenerative joint disease), lumbosacral   ? GERD (gastroesophageal reflux disease)   ? Headache(784.0)   ? Headache(784.0)   ? Hyperlipidemia   ? Hypertension   ? IBS (irritable bowel syndrome)   ? Sinusitis, chronic   ? ? ?Patient Active Problem List  ? Diagnosis Date Noted  ? Rash 01/10/2021  ? Left hip pain 03/22/2019  ? Mild intermittent asthma 10/08/2016  ? Routine health maintenance 10/25/2012  ? Borderline high  cholesterol 01/19/2010  ? G E REFLUX 12/10/2007  ? Generalized anxiety disorder 12/04/2007  ? Essential hypertension 12/01/2006  ? Allergic rhinitis 12/01/2006  ? IRRITABLE BOWEL SYNDROME 12/01/2006  ? Osteoarthritis 12/01/2006  ? ? ?Past Surgical History:  ?Procedure Laterality Date  ? BREAST EXCISIONAL BIOPSY Left 1998  ? CATARACT EXTRACTION Right   ? 04/21/18  ? ORIF ANKLE FRACTURE  '07  ? Left ankle  ? SEPTOPLASTY  2000  ? TONSILLECTOMY    ? transnasal laryngoscopy  2005  ? TUBAL LIGATION  1977  ? ? ?OB History   ?No obstetric history on file. ?  ? ? ? ?Home Medications   ? ?Prior to Admission medications   ?Medication Sig Start Date End Date Taking? Authorizing Provider  ?predniSONE (DELTASONE) 20 MG tablet Take 2 tablets (40 mg total) by mouth daily with breakfast for 5 days. 08/13/21 08/18/21 Yes Eulogio Bear, NP  ?albuterol (PROVENTIL HFA;VENTOLIN HFA) 108 (90 Base) MCG/ACT inhaler Inhale 2 puffs into the lungs every 6 (six) hours as needed. 03/28/16   Hoyt Koch, MD  ?Ascorbic Acid (VITAMIN C PO) Take 1 tablet by mouth daily.    [provider]  ?Adair Patter [phone] MCG/INH AEPB INHALE 1 PUFF PO QD 02/27/16   [provider]  ?buPROPion (WELLBUTRIN XL) 150 MG 24 hr tablet TAKE 1 TABLET(150 MG) BY MOUTH DAILY 06/05/21   Hoyt Koch, MD  ?clonazePAM (KLONOPIN) 0.5 MG tablet TAKE 1 TABLET(0.5 MG) BY  MOUTH DAILY AS NEEDED FOR ANXIETY 08/07/21   [name], MD  ?Cyanocobalamin (B-12 PO) Take by mouth daily.    [provider]  ?diclofenac Sodium (VOLTAREN) 1 % GEL Apply 4 g topically 4 (four) times daily. 02/26/20   Georgetta Haber, NP  ?dicyclomine (BENTYL) 20 MG tablet Take 1 tablet (20 mg total) by mouth 4 (four) times daily -  before meals and at bedtime. 07/19/21   [name], MD  ?diltiazem [facility]) 120 MG 24 hr capsule TAKE 1 CAPSULE BY MOUTH DAILY 05/08/21   [name], MD  ?dorzolamide (TRUSOPT) 2 % ophthalmic solution  Place into both eyes 2 (two) times daily.  01/13/14   [provider]  ?escitalopram (LEXAPRO) 10 MG tablet TAKE 2 TABLETS(20 MG) BY MOUTH DAILY 12/04/20   [name], MD  ?fluticasone (FLONASE) 50 MCG/ACT nasal spray Place 1 spray into both nostrils daily. 07/22/20   Rhys Martini, PA-C  ?furosemide (LASIX) 40 MG tablet TAKE 1 TABLET(40 MG) BY MOUTH DAILY 01/09/21   [name], MD  ?gabapentin (NEURONTIN) 100 MG capsule Take 100 mg by mouth at bedtime. 10/19/19   Melina Fiddler, MD  ?ibuprofen (ADVIL) 800 MG tablet TAKE 1 TABLET(800 MG) BY MOUTH DAILY AS NEEDED FOR MODERATE PAIN 01/18/20   [name], MD  ?ipratropium (ATROVENT) 0.06 % nasal spray Place 2 sprays into both nostrils 4 (four) times daily. 05/17/18   Cathie Hoops, Amy V, PA-C  ?latanoprost (XALATAN) 0.005 % ophthalmic solution Place 1 drop into both eyes daily. 10/22/12   Norins, Rosalyn Gess, MD  ?levalbuterol [facility]) 1.25 MG/3ML nebulizer solution Take 1.25 mg by nebulization as needed. 10/22/12   Norins, Rosalyn Gess, MD  ?levocetirizine (XYZAL) 5 MG tablet TK 1 T PO QD IN THE EVE 02/23/18   [provider]  ?losartan (COZAAR) 100 MG tablet TAKE 1 TABLET BY MOUTH EVERY DAY 07/06/21   [name], MD  ?multivitamin [facility]) tablet Take 1 tablet by mouth daily.    [provider]  ?omeprazole (PRILOSEC) 40 MG capsule TAKE 1 CAPSULE BY MOUTH EVERY DAY 07/25/20   [name], MD  ?potassium chloride (KLOR-CON) 8 MEQ tablet TAKE 1 TABLET(8 MEQ) BY MOUTH DAILY 08/07/21   [name], MD  ?prednisoLONE acetate (PRED FORTE) 1 % ophthalmic suspension Place 1 drop into the left eye 4 (four) times daily.    [provider]  ?sodium chloride (OCEAN) 0.65 % SOLN nasal spray Place 1 spray into both nostrils as needed for congestion. 09/21/19   Darr, Gerilyn Pilgrim, PA-C  ?triamcinolone cream (KENALOG) 0.1 % Apply 1 application topically 2 (two) times daily. 01/10/21   [name], MD   ?diltiazem (DILACOR XR) 120 MG 24 hr capsule Take 1 capsule (120 mg total) by mouth daily. 10/22/11 10/11/12  Norins, Rosalyn Gess, MD  ? ? ?Family History ?Family History  ?Problem Relation Age of Onset  ? Hypertension Father   ? Coronary artery disease Father   ?     MI/ Fatal  ? Breast cancer Neg Hx   ? Diabetes Neg Hx   ? Colon cancer Neg Hx   ? ? ?Social History ?Social History  ? ?Tobacco Use  ? Smoking status: Never  ? Smokeless tobacco: Never  ?Vaping Use  ? Vaping Use: Never used  ?Substance Use Topics  ? Alcohol use: Not Currently  ? Drug use: Not Currently  ? ? ? ?Allergies   ?Enalapril maleate  and Sulfamethoxazole-trimethoprim ? ? ?Review of Systems ?Review of Systems ?Per HPI ? ?Physical Exam ?Triage Vital Signs ?ED Triage Vitals  ?Enc Vitals Group  ?   BP 08/13/21 1013 (!) 152/86  ?   Pulse Rate 08/13/21 1013 66  ?   Resp 08/13/21 1013 16  ?   Temp 08/13/21 1013 98.4 ?F (36.9 ?C)  ?   Temp Source 08/13/21 1013 Oral  ?   SpO2 08/13/21 1013 97 %  ?   Weight --   ?   Height --   ?   Head Circumference --   ?   Peak Flow --   ?   Pain Score 08/13/21 1009 0  ?   Pain Loc --   ?   Pain Edu? --   ?   Excl. in [place]? --   ? ?No data found. ? ?Updated Vital Signs ?BP (!) 152/86 (BP Location: Left Arm)   Pulse 66   Temp 98.4 ?F (36.9 ?C) (Oral)   Resp 16   SpO2 97%  ? ?Visual Acuity ?Right Eye Distance:   ?Left Eye Distance:   ?Bilateral Distance:   ? ?Right Eye Near:   ?Left Eye Near:    ?Bilateral Near:    ? ?Physical Exam ?Vitals and nursing note reviewed.  ?Constitutional:   ?   General: She is not in acute distress. ?   Appearance: Normal appearance. She is not ill-appearing or toxic-appearing.  ?HENT:  ?   Head: Normocephalic and atraumatic.  ?   Right Ear: Tympanic membrane, ear canal and external ear normal. There is no impacted cerumen.  ?   Left Ear: Tympanic membrane, ear canal and external ear normal. There is no impacted cerumen.  ?   Nose: Nose normal. No congestion or rhinorrhea.  ?   Mouth/Throat:  ?    Mouth: Mucous membranes are moist.  ?   Pharynx: Oropharynx is clear. No posterior oropharyngeal erythema.  ?Eyes:  ?   General: No scleral icterus.    ?   Right eye: No discharge.     ?   Left eye: No discharge.

## 2021-08-13 NOTE — ED Triage Notes (Signed)
08/09/2021 started with sore throat.  Since then, symptoms progressed.  Patient has stuffy head, cough, chest congestion, productive cough, laryngitis.   ?

## 2021-08-14 LAB — SARS CORONAVIRUS 2 (TAT 6-24 HRS): SARS Coronavirus 2: NEGATIVE

## 2021-08-22 ENCOUNTER — Ambulatory Visit
Admission: RE | Admit: 2021-08-22 | Discharge: 2021-08-22 | Disposition: A | Discharge status: Home / Self Care | Payer: PPO | Source: Ambulatory Visit | Attending: Allergy | Admitting: Allergy

## 2021-08-22 ENCOUNTER — Other Ambulatory Visit: Payer: Self-pay | Admitting: Allergy

## 2021-08-22 DIAGNOSIS — J4530 Mild persistent asthma, uncomplicated: Secondary | ICD-10-CM

## 2021-08-22 DIAGNOSIS — J019 Acute sinusitis, unspecified: Secondary | ICD-10-CM | POA: Diagnosis not present

## 2021-08-22 DIAGNOSIS — R059 Cough, unspecified: Secondary | ICD-10-CM | POA: Diagnosis not present

## 2021-08-22 DIAGNOSIS — K219 Gastro-esophageal reflux disease without esophagitis: Secondary | ICD-10-CM | POA: Diagnosis not present

## 2021-08-22 DIAGNOSIS — J209 Acute bronchitis, unspecified: Secondary | ICD-10-CM | POA: Diagnosis not present

## 2021-08-22 DIAGNOSIS — J300 Vasomotor rhinitis: Secondary | ICD-10-CM | POA: Diagnosis not present

## 2021-08-24 ENCOUNTER — Telehealth: Payer: Self-pay

## 2021-08-24 NOTE — Progress Notes (Signed)
? ? ?Chronic Care Management ?Pharmacy Assistant  ? ?Name: Dominique Johnston  MRN: [id number] DOB: 1946-04-30 ? ? ?Reason for Encounter: Disease State-General ?  ? ?Recent office visits:  ?07/19/21 Dominique Broker, MD-PCP (Annual exam) Blood work ordered, med changes: Dicyclomine 20 mg 3 times daily  ? ?07/04/21 Dominique Merl, PA-C (Covid) Meds: Molnupiravir ? ?Recent consult visits:  ?None ID ? ?Hospital visits:  ?Medication Reconciliation was completed by comparing discharge summary, patient?s EMR and Pharmacy list, and upon discussion with patient. ? ?Admitted to the hospital on 08/13/21 due to mild intermittent asthma with exacerbation. Discharge date was 08/13/21. Discharged from [facility].  ? ?New?Medications Started at [facility] Discharge:?? ?-started prednisone 20  ? ?Medications that remain the same after Hospital Discharge:??  ?-All other medications will remain the same.   ? ?Medications: ?Outpatient Encounter Medications as of 08/24/2021  ?Medication Sig Note  ? albuterol (PROVENTIL HFA;VENTOLIN HFA) 108 (90 Base) MCG/ACT inhaler Inhale 2 puffs into the lungs every 6 (six) hours as needed.   ? Ascorbic Acid (VITAMIN C PO) Take 1 tablet by mouth daily.   ? BREO ELLIPTA 200-25 MCG/INH AEPB INHALE 1 PUFF PO QD 03/14/2016: Received from: External Pharmacy  ? buPROPion (WELLBUTRIN XL) 150 MG 24 hr tablet TAKE 1 TABLET(150 MG) BY MOUTH DAILY   ? clonazePAM (KLONOPIN) 0.5 MG tablet TAKE 1 TABLET(0.5 MG) BY MOUTH DAILY AS NEEDED FOR ANXIETY   ? Cyanocobalamin (B-12 PO) Take by mouth daily.   ? diclofenac Sodium (VOLTAREN) 1 % GEL Apply 4 g topically 4 (four) times daily.   ? dicyclomine (BENTYL) 20 MG tablet Take 1 tablet (20 mg total) by mouth 4 (four) times daily -  before meals and at bedtime.   ? diltiazem (TIAZAC) 120 MG 24 hr capsule TAKE 1 CAPSULE BY MOUTH DAILY   ? dorzolamide (TRUSOPT) 2 % ophthalmic solution Place into both eyes 2 (two) times daily.  03/02/2014:  Received from: External Pharmacy  ? escitalopram (LEXAPRO) 10 MG tablet TAKE 2 TABLETS(20 MG) BY MOUTH DAILY   ? fluticasone (FLONASE) 50 MCG/ACT nasal spray Place 1 spray into both nostrils daily.   ? furosemide (LASIX) 40 MG tablet TAKE 1 TABLET(40 MG) BY MOUTH DAILY   ? gabapentin (NEURONTIN) 100 MG capsule Take 100 mg by mouth at bedtime.   ? ibuprofen (ADVIL) 800 MG tablet TAKE 1 TABLET(800 MG) BY MOUTH DAILY AS NEEDED FOR MODERATE PAIN   ? ipratropium (ATROVENT) 0.06 % nasal spray Place 2 sprays into both nostrils 4 (four) times daily.   ? latanoprost (XALATAN) 0.005 % ophthalmic solution Place 1 drop into both eyes daily.   ? levalbuterol (XOPENEX) 1.25 MG/3ML nebulizer solution Take 1.25 mg by nebulization as needed. 02/27/2015: Takes as needed   ? levocetirizine (XYZAL) 5 MG tablet TK 1 T PO QD IN THE EVE   ? losartan (COZAAR) 100 MG tablet TAKE 1 TABLET BY MOUTH EVERY DAY   ? multivitamin (THERAGRAN) tablet Take 1 tablet by mouth daily.   ? omeprazole (PRILOSEC) 40 MG capsule TAKE 1 CAPSULE BY MOUTH EVERY DAY   ? potassium chloride (KLOR-CON) 8 MEQ tablet TAKE 1 TABLET(8 MEQ) BY MOUTH DAILY   ? prednisoLONE acetate (PRED FORTE) 1 % ophthalmic suspension Place 1 drop into the left eye 4 (four) times daily.   ? sodium chloride (OCEAN) 0.65 % SOLN nasal spray Place 1 spray into both nostrils as needed for congestion.   ? triamcinolone cream (KENALOG)  0.1 % Apply 1 application topically 2 (two) times daily.   ? [DISCONTINUED] diltiazem (DILACOR XR) 120 MG 24 hr capsule Take 1 capsule (120 mg total) by mouth daily.   ? ?No facility-administered encounter medications on file as of 08/24/2021.  ? ?Have you had any problems recently with your health?Patient states that she is doing a lot better since last week. She states that her cough is clearing up. Patient also states that she will be getting a new generic inhaler for Breo soon that is less expensive. Patient also stated that she is going to set up appt for  shingle shot at the pharmacy ? ?Have you had any problems with your pharmacy?Patient states that she does not have any problems with getting medications from the pharmacy or the cost of medications ? ?What issues or side effects are you having with your medications?Patient states that she does not have any side effects from medications ? ?What would you like me to pass along to [facility] for them to help you with? Patient states that she is doing well and that she had an xray done because they thought she had pneumonia but it was clear.  ? ?What can we do to take care of you better? Patient states that she does not need anything at this time ? ?Care Gaps: ?Colonoscopy-12/24/12 ?Diabetic Foot Exam-NA ?Mammogram-NA ?Ophthalmology-NA ?Dexa Scan - NA ?Annual Well Visit - 07/19/21 ?Micro albumin-NA ?Hemoglobin A1c- NA ? ? ?Star Rating Drugs: ?Losartan 100 mg-last fill 07/06/21 90 ds ? ?  ?Velvet Bathe ?Clinical Pharmacist Assistant ?[phone]  ?

## 2021-08-27 DIAGNOSIS — H35352 Cystoid macular degeneration, left eye: Secondary | ICD-10-CM | POA: Diagnosis not present

## 2021-09-01 ENCOUNTER — Other Ambulatory Visit: Payer: Self-pay | Admitting: Internal Medicine

## 2021-09-03 ENCOUNTER — Other Ambulatory Visit: Payer: Self-pay | Admitting: Internal Medicine

## 2021-10-01 DIAGNOSIS — H35353 Cystoid macular degeneration, bilateral: Secondary | ICD-10-CM | POA: Diagnosis not present

## 2021-10-15 DIAGNOSIS — H35373 Puckering of macula, bilateral: Secondary | ICD-10-CM | POA: Diagnosis not present

## 2021-10-15 DIAGNOSIS — H31093 Other chorioretinal scars, bilateral: Secondary | ICD-10-CM | POA: Diagnosis not present

## 2021-10-15 DIAGNOSIS — H59033 Cystoid macular edema following cataract surgery, bilateral: Secondary | ICD-10-CM | POA: Diagnosis not present

## 2021-10-15 DIAGNOSIS — H35033 Hypertensive retinopathy, bilateral: Secondary | ICD-10-CM | POA: Diagnosis not present

## 2021-10-15 DIAGNOSIS — H43813 Vitreous degeneration, bilateral: Secondary | ICD-10-CM | POA: Diagnosis not present

## 2021-10-17 DIAGNOSIS — M5416 Radiculopathy, lumbar region: Secondary | ICD-10-CM | POA: Diagnosis not present

## 2021-10-17 DIAGNOSIS — H59033 Cystoid macular edema following cataract surgery, bilateral: Secondary | ICD-10-CM | POA: Diagnosis not present

## 2021-10-19 ENCOUNTER — Ambulatory Visit (INDEPENDENT_AMBULATORY_CARE_PROVIDER_SITE_OTHER): Payer: PPO

## 2021-10-19 DIAGNOSIS — I10 Essential (primary) hypertension: Secondary | ICD-10-CM

## 2021-10-19 DIAGNOSIS — K219 Gastro-esophageal reflux disease without esophagitis: Secondary | ICD-10-CM

## 2021-10-19 DIAGNOSIS — R944 Abnormal results of kidney function studies: Secondary | ICD-10-CM

## 2021-10-19 DIAGNOSIS — J4520 Mild intermittent asthma, uncomplicated: Secondary | ICD-10-CM

## 2021-10-19 NOTE — Progress Notes (Signed)
Chronic Care Management Pharmacy Note  10/19/2021 Name:  Dominique Johnston MRN:  [id number] DOB:  16-Jun-1945  Summary: -Patient endorses compliance with current medications, denies any issues with medications at this time  -Was started on ketorolac eye drops in addition to her previous eye drops earlier this week by her retina specialist -BP at home averaging 127/74 - denies any issues with BP -Has not yet received shingrix vaccine, plans to complete vaccination at pharmacy this summer   Recommendations/Changes made from today's visit: -Recommending no changes to medications, patient to continue to monitor BP, will reach out should BP control be lost prior to next appointment  -Limit use of NSAIDs in setting of reduced kidney function  -Plans to get shingles vaccine later in the summer at her local  pharmacy   Subjective: Dominique Johnston is an 76 y.o. year old female who is a primary patient of Hoyt Koch, MD.  The CCM team was consulted for assistance with disease management and care coordination needs.    Engaged with patient by telephone for follow up visit in response to provider referral for pharmacy case management and/or care coordination services.   Consent to Services:  The patient was given information about Chronic Care Management services, agreed to services, and gave verbal consent prior to initiation of services.  Please see initial visit note for detailed documentation.   Patient Care Team: Hoyt Koch, MD as PCP - General (Internal Medicine) Mosetta Anis, MD (Allergy) Romine, Lubertha South, MD (Obstetrics and Gynecology) Verner Chol, MD as Consulting Physician (Sports Medicine) Katy Apo, MD as Consulting Physician (Ophthalmology) Charlton Haws, [facility] as Pharmacist (Pharmacist)   Pt was married for 29 years, ex-husband was Social research officer, government now lives in [place], moved frequently. Lived in [place], [place], [place], [place],  [place], [place]. Has 2 children in [place]. From [place], [place]. Graduated from [employer] in [place]. "My life has been a soap opera". Lost her mom last year at 66 which has been very hard as they were very close. Lost her sister at 109 in car accident. 2 brothers still living. Father died at 37. She is oldest of her siblings.   Recent office visits: 07/19/2021 -  Dr. Sharlet Salina - no changes to medications   Recent consult visits: 07/04/2021 - Raiford Noble PA-C - [place] rx'd    Hospital visits: 08/13/2021 - Urgent Care - cough - asthma exacerbation / viral URI - given solu-medrol in urgent care - rx'd prednisone $RemoveBeforeD'40mg'woDcvlyaqvWRQG$  x 5 days on discharge    Objective:  Lab Results  Component Value Date   CREATININE 1.08 07/19/2021   BUN 12 07/19/2021   GFR 50.23 (L) 07/19/2021   GFRNONAA 64.40 07/19/2009   GFRAA 65 07/18/2008   NA 143 07/19/2021   K 4.1 07/19/2021   CALCIUM 9.1 07/19/2021   CO2 28 07/19/2021   GLUCOSE 82 07/19/2021    Lab Results  Component Value Date/Time   GFR 50.23 (L) 07/19/2021 11:19 AM   GFR 45.97 (L) 06/19/2020 11:22 AM    Last diabetic Eye exam: No results found for: "HMDIABEYEEXA"  Last diabetic Foot exam: No results found for: "HMDIABFOOTEX"   Lab Results  Component Value Date   CHOL 167 07/19/2021   HDL 60.80 07/19/2021   LDLCALC 97 07/19/2021   LDLDIRECT 137.0 10/22/2011   TRIG 48.0 07/19/2021   CHOLHDL 3 07/19/2021       Latest Ref Rng & Units 07/19/2021  11:19 AM 06/19/2020   11:22 AM 03/22/2019   11:16 AM  Hepatic Function  Total Protein 6.0 - 8.3 g/dL 7.1  7.0  7.3   Albumin 3.5 - 5.2 g/dL 3.8  3.8  4.0   AST 0 - 37 U/L $Remo'19  17  17   'mruyb$ ALT 0 - 35 U/L $Remo'12  9  14   'UhEYs$ Alk Phosphatase 39 - 117 U/L 77  74  81   Total Bilirubin 0.2 - 1.2 mg/dL 0.5  0.4  0.4     Lab Results  Component Value Date/Time   TSH 0.98 04/01/2018 11:06 AM   TSH 2.17 03/31/2017 10:45 AM       Latest Ref Rng & Units 07/19/2021   11:19 AM 06/19/2020   11:22 AM  03/22/2019   11:16 AM  CBC  WBC 4.0 - 10.5 K/uL 4.5  4.2  4.1   Hemoglobin 12.0 - 15.0 g/dL 11.5  12.3  12.0   Hematocrit 36.0 - 46.0 % 36.9  39.6  38.6   Platelets 150.0 - 400.0 K/uL 191.0  197.0  211.0     No results found for: "VD25OH"  Clinical ASCVD: No  The 10-year ASCVD risk score (Arnett DK, et al., 2019) is: 17.3%   Values used to calculate the score:     Age: 71 years     Sex: Female     Is Non-Hispanic African American: Yes     Diabetic: No     Tobacco smoker: No     Systolic Blood Pressure: 258 mmHg     Is BP treated: Yes     HDL Cholesterol: 60.8 mg/dL     Total Cholesterol: 167 mg/dL       06/19/2021   10:12 AM 05/09/2020    3:17 PM 05/09/2020    3:14 PM  Depression screen PHQ 2/9  Decreased Interest 0 0 0  Down, Depressed, Hopeless 0 0 0  PHQ - 2 Score 0 0 0       Social History   Tobacco Use  Smoking Status Never  Smokeless Tobacco Never   BP Readings from Last 3 Encounters:  08/13/21 (!) 152/86  07/19/21 126/74  01/10/21 124/90   Pulse Readings from Last 3 Encounters:  08/13/21 66  07/19/21 78  01/10/21 66   Wt Readings from Last 3 Encounters:  07/19/21 182 lb (82.6 kg)  01/10/21 178 lb (80.7 kg)  06/19/20 189 lb 9.6 oz (86 kg)   BMI Readings from Last 3 Encounters:  07/19/21 29.38 kg/m  01/10/21 28.73 kg/m  06/19/20 30.60 kg/m    Assessment/Interventions: Review of patient past medical history, allergies, medications, health status, including review of consultants reports, laboratory and other test data, was performed as part of comprehensive evaluation and provision of chronic care management services.   SDOH:  (Social Determinants of Health) assessments and interventions performed: Yes  SDOH Screenings   Alcohol Screen: Low Risk  (06/19/2021)   Alcohol Screen    Last Alcohol Screening Score (AUDIT): 0  Depression (PHQ2-9): Low Risk  (06/19/2021)   Depression (PHQ2-9)    PHQ-2 Score: 0  Financial Resource Strain: Low Risk   (06/19/2021)   Overall Financial Resource Strain (CARDIA)    Difficulty of Paying Living Expenses: Not hard at all  Food Insecurity: No Food Insecurity (06/19/2021)   Hunger Vital Sign    Worried About Running Out of Food in the Last Year: Never true    Ran Out of Food in the Last  Year: Never true  Housing: Low Risk  (06/19/2021)   Housing    Last Housing Risk Score: 0  Physical Activity: Inactive (06/19/2021)   Exercise Vital Sign    Days of Exercise per Week: 0 days    Minutes of Exercise per Session: 0 min  Social Connections: Moderately Integrated (06/19/2021)   Social Connection and Isolation Panel [NHANES]    Frequency of Communication with Friends and Family: More than three times a week    Frequency of Social Gatherings with Friends and Family: Once a week    Attends Religious Services: 1 to 4 times per year    Active Member of [employer] or Organizations: No    Attends Archivist Meetings: 1 to 4 times per year    Marital Status: Divorced  Stress: No Stress Concern Present (06/19/2021)   [place]    Feeling of Stress : Not at all  Tobacco Use: Low Risk  (08/13/2021)   Patient History    Smoking Tobacco Use: Never    Smokeless Tobacco Use: Never    Passive Exposure: Not on file  Transportation Needs: No Transportation Needs (06/19/2021)   PRAPARE - Transportation    Lack of Transportation (Medical): No    Lack of Transportation (Non-Medical): No    CCM Care Plan  Allergies  Allergen Reactions   Enalapril Maleate     REACTION: swelling, headache   Sulfamethoxazole-Trimethoprim     Abdominal pain, redness eyes    Medications Reviewed Today     Reviewed by Tomasa Blase, [facility] (Pharmacist) on 10/19/21 at [place] List Status: <None>   Medication Order Taking? Sig Documenting Provider Last Dose Status Informant  albuterol (PROVENTIL HFA;VENTOLIN HFA) 108 (90 Base) MCG/ACT inhaler [id number] Yes Inhale 2  puffs into the lungs every 6 (six) hours as needed. Hoyt Koch, MD Taking Active   Ascorbic Acid (VITAMIN C PO) [id number] Yes Take 1 tablet by mouth daily. [provider] Taking Active   BREO ELLIPTA 200-25 MCG/INH AEPB [id number] Yes INHALE 1 PUFF PO QD [provider] Taking Active            Med Note Izola Price, AMY R   Thu Mar 14, 2016  9:06 AM) Received from: External Pharmacy  buPROPion (WELLBUTRIN XL) 150 MG 24 hr tablet [id number] Yes TAKE 1 TABLET(150 MG) BY MOUTH DAILY Hoyt Koch, MD Taking Active   clonazePAM (KLONOPIN) 0.5 MG tablet [id number] Yes TAKE 1 TABLET(0.5 MG) BY MOUTH DAILY AS NEEDED FOR ANXIETY Hoyt Koch, MD Taking Active   Cyanocobalamin (B-12 PO) [id number] Yes Take by mouth daily. [provider] Taking Active   diclofenac Sodium (VOLTAREN) 1 % GEL [id number] Yes Apply 4 g topically 4 (four) times daily. Zigmund Gottron, NP Taking Active   dicyclomine (BENTYL) 20 MG tablet [id number] Yes Take 1 tablet (20 mg total) by mouth 4 (four) times daily -  before meals and at bedtime. Hoyt Koch, MD Taking Active     Discontinued 10/12/12 0915 (Reorder)   diltiazem (TIAZAC) 120 MG 24 hr capsule [id number] Yes TAKE 1 CAPSULE BY MOUTH DAILY Hoyt Koch, MD Taking Active   dorzolamide (TRUSOPT) 2 % ophthalmic solution [id number] Yes Place into both eyes 2 (two) times daily.  [provider] Taking Active            Med Note Izola Price, AMY R   Wed Mar 02, 2014  9:27 AM) Received  from: External Pharmacy  escitalopram (LEXAPRO) 10 MG tablet [id number] Yes TAKE 2 TABLETS(20 MG) BY MOUTH DAILY Hoyt Koch, MD Taking Active   fluticasone [facility]) 50 MCG/ACT nasal spray [id number] Yes Place 1 spray into both nostrils daily. Hazel Sams, PA-C Taking Active   furosemide (LASIX) 40 MG tablet [id number] Yes TAKE 1 TABLET(40 MG) BY MOUTH DAILY Hoyt Koch, MD Taking Active   gabapentin  (NEURONTIN) 100 MG capsule [id number] Yes Take 100 mg by mouth at bedtime. Verner Chol, MD Taking Active   ibuprofen (ADVIL) 800 MG tablet [id number]  TAKE 1 TABLET(800 MG) BY MOUTH DAILY AS NEEDED FOR MODERATE PAIN Hoyt Koch, MD  Active   ipratropium (ATROVENT) 0.06 % nasal spray [id number] Yes Place 2 sprays into both nostrils 4 (four) times daily. Ok Edwards, PA-C Taking Active   ketorolac (ACULAR) 0.4 % SOLN [id number] Yes 1 drop 4 (four) times daily. [provider] Taking Active   latanoprost (XALATAN) 0.005 % ophthalmic solution [id number] Yes Place 1 drop into both eyes daily. Norins, Heinz Knuckles, MD Taking Active   levalbuterol Penne Lash) 1.25 MG/3ML nebulizer solution [id number] No Take 1.25 mg by nebulization as needed.  Patient not taking: Reported on 10/19/2021   Neena Rhymes, MD Not Taking Active            Med Note Neal Dy Feb 27, 2015 11:33 AM) Dewaine Conger as needed   levocetirizine (XYZAL) 5 MG tablet [id number] Yes Take 2.5 mg by mouth every evening. [provider] Taking Active   losartan (COZAAR) 100 MG tablet [id number] Yes TAKE 1 TABLET BY MOUTH EVERY DAY Hoyt Koch, MD Taking Active   multivitamin [facility]) tablet [id number] Yes Take 1 tablet by mouth daily. [provider] Taking Active   omeprazole (PRILOSEC) 40 MG capsule [id number] Yes TAKE 1 CAPSULE BY MOUTH EVERY DAY Hoyt Koch, MD Taking Active   potassium chloride (KLOR-CON) 8 MEQ tablet [id number] Yes TAKE 1 TABLET(8 MEQ) BY MOUTH DAILY Hoyt Koch, MD Taking Active   prednisoLONE acetate (PRED FORTE) 1 % ophthalmic suspension [id number] Yes Place 1 drop into the left eye 4 (four) times daily. [provider] Taking Active   sodium chloride (OCEAN) 0.65 % SOLN nasal spray [id number] Yes Place 1 spray into both nostrils as needed for congestion. Darr, Edison Nasuti, PA-C Taking Active   triamcinolone cream (KENALOG) 0.1 % [id number]  Apply 1  application topically 2 (two) times daily. Hoyt Koch, MD  Active             Patient Active Problem List   Diagnosis Date Noted   Rash 01/10/2021   Left hip pain 03/22/2019   Mild intermittent asthma 10/08/2016   Routine health maintenance 10/25/2012   Borderline high cholesterol 01/19/2010   G E REFLUX 12/10/2007   Generalized anxiety disorder 12/04/2007   Essential hypertension 12/01/2006   Allergic rhinitis 12/01/2006   IRRITABLE BOWEL SYNDROME 12/01/2006   Osteoarthritis 12/01/2006    Immunization History  Administered Date(s) Administered   Fluad Quad(high Dose 65+) 02/16/2019, 03/04/2021   Influenza Split 04/05/2011   Influenza Whole 04/22/2007, 02/23/2008, 02/21/2009, 03/06/2009, 02/28/2010, 01/26/2014   Influenza, High Dose Seasonal PF 03/06/2017   Influenza,inj,Quad PF,6+ Mos 01/17/2016   Influenza-Unspecified 02/21/2015, 02/10/2018, 03/13/2020   PFIZER(Purple Top)SARS-COV-2 Vaccination 07/28/2019, 08/25/2019, 03/27/2020, 12/12/2020   Pfizer Covid-19 Vaccine Bivalent Booster 88yrs & up 03/04/2021   Pneumococcal Conjugate-13 03/16/2015   Pneumococcal Polysaccharide-23 05/29/2005, 04/05/2011, 06/19/2020   Tdap  05/03/2019   Zoster, Live 07/26/2009    Conditions to be addressed/monitored:  Hypertension, Hyperlipidemia, Asthma, Chronic Kidney Disease, Depression and Anxiety  Care Plan : [place]  Updates made by Tomasa Blase, RPH since 10/19/2021 12:00 AM     Problem: Hypertension, Hyperlipidemia, Asthma, Chronic Kidney Disease, Depression and Anxiety   Priority: High     Long-Range Goal: Disease management   Start Date: 10/16/2020  Expected End Date: 10/20/2022  This Visit's Progress: On track  Recent Progress: On track  Priority: High  Note:    Current Barriers:  Unable to independently monitor therapeutic efficacy  Pharmacist Clinical Goal(s):  Patient will achieve adherence to monitoring guidelines and medication adherence  to achieve therapeutic efficacy through collaboration with PharmD and provider.   Interventions: 1:1 collaboration with Hoyt Koch, MD regarding development and update of comprehensive plan of care as evidenced by provider attestation and co-signature Inter-disciplinary care team collaboration (see longitudinal plan of care) Comprehensive medication review performed; medication list updated in electronic medical record  Asthma - prevention of exacerbation     Last spirometry score: n/a Patient has failed these meds in past: n/a Patient is currently controlled on the  following medications:  Albuterol HFA 108 mcg/act 2 puff q6hr PRN  Breo Ellipta 200-25 mcg/inh 1 puff daily  Levalbuterol 1.$RemoveBeforeD'25mg'utCeNgINzdjxvj$ /12mL neb PRN    Using maintenance inhaler regularly? Yes Frequency of rescue inhaler use:  infrequently   We discussed:  Pt reports symptoms are generally well controlled; she does have worse SOB/coughing during spring/pollen / colder weather   Plan: Continue current medications    Hypertension    BP goal is:  <140/90  Patient checks BP at home several times per month Patient home BP readings are ranging: averaging 127/74 recenetly    Patient has failed these meds in the past: n/a Patient is currently controlled on the following medications:  Diltiazem 120 mg daily PM Losartan 100 mg daily PM Furosemide 40 mg daily PRN Potassium chloride 8 mEq w/ furosemide   We discussed BP goals, optimal timing of BP medications at bedtime for most effective control; does not take furosemide every single day if she is feeling dehydrated.     Plan: Continue current medications    Depression / Anxiety    Patient has failed these meds in past: sertraline, venlafaxine Patient is currently controlled on the following medications:  Clonazepam 0.5 mg daily PRN  Escitalopram 10 mg 2 tabs daily Bupropion XL 150 mg daily     Plan: Continue current medications  CKD stage 3 (Goal: prevent  progression) -Stable -Plan: increase hydration, avoid nephrotoxins (NSAIDs), focus on BP control  Health Maintenance -Vaccine gaps: Advised to get Shingrix vaccine  Patient Goals/Self-Care Activities Patient will:  - take medications as prescribed -focus on medication adherence by routine -check blood pressure at least once weekly, document, and provide at future appointments -engage in dietary modifications by increasing hydration  -Limit NSAID use to protect kidneys    Medication Assistance: None required.  Patient affirms current coverage meets needs.  Care Gaps: Shingrix COVID booster   Patient's preferred pharmacy is:  [id number] - [place], [address] [place] [address] Phone: [phone] Fax: [phone]   Uses pill box? No - prefers bottles Pt endorses 100% compliance  Care Plan and Follow Up Patient Decision:  Patient agrees to Care Plan and Follow-up.  Plan:  Telephone follow up appointment with care management team member scheduled for:  6 months  Tomasa Blase, PharmD Clinical Pharmacist, [place]

## 2021-10-19 NOTE — Patient Instructions (Signed)
Visit Information  Following are the goals we discussed today:   Manage My Medicine    Timeframe:  Long-Range Goal Priority:  Medium Start Date:    10/12/20                         Expected End Date:     10/2022                  Follow Up Date 04/2022   - call for medicine refill 2 or 3 days before it runs out - call if I am sick and can't take my medicine - keep a list of all the medicines I take; vitamins and herbals too - use a pillbox to sort medicine  -Limit NSAID use to protect kidneys -Get COVID booster and Shingrix vaccine at local pharmacy   Why is this important?   These steps will help you keep on track with your medicines.  Plan: Telephone follow up appointment with care management team member scheduled for:  6 months The patient has been provided with contact information for the care management team and has been advised to call with any health related questions or concerns.   Ellin Saba, PharmD Clinical Pharmacist, [facility]   Please call the care guide team at [phone] if you need to cancel or reschedule your appointment.   Patient verbalizes understanding of instructions and care plan provided today and agrees to view in MyChart. Active MyChart status and patient understanding of how to access instructions and care plan via MyChart confirmed with patient.

## 2021-11-09 DIAGNOSIS — J45909 Unspecified asthma, uncomplicated: Secondary | ICD-10-CM | POA: Diagnosis not present

## 2021-11-09 DIAGNOSIS — F32A Depression, unspecified: Secondary | ICD-10-CM

## 2021-11-09 DIAGNOSIS — N1831 Chronic kidney disease, stage 3a: Secondary | ICD-10-CM | POA: Diagnosis not present

## 2021-11-09 DIAGNOSIS — I129 Hypertensive chronic kidney disease with stage 1 through stage 4 chronic kidney disease, or unspecified chronic kidney disease: Secondary | ICD-10-CM

## 2021-11-15 ENCOUNTER — Other Ambulatory Visit: Payer: Self-pay | Admitting: Internal Medicine

## 2021-11-26 DIAGNOSIS — H43813 Vitreous degeneration, bilateral: Secondary | ICD-10-CM | POA: Diagnosis not present

## 2021-11-26 DIAGNOSIS — H35373 Puckering of macula, bilateral: Secondary | ICD-10-CM | POA: Diagnosis not present

## 2021-11-26 DIAGNOSIS — H35033 Hypertensive retinopathy, bilateral: Secondary | ICD-10-CM | POA: Diagnosis not present

## 2021-11-26 DIAGNOSIS — H59033 Cystoid macular edema following cataract surgery, bilateral: Secondary | ICD-10-CM | POA: Diagnosis not present

## 2021-12-02 ENCOUNTER — Other Ambulatory Visit: Payer: Self-pay | Admitting: Internal Medicine

## 2022-01-04 ENCOUNTER — Telehealth: Payer: Self-pay | Admitting: Internal Medicine

## 2022-01-07 DIAGNOSIS — H59033 Cystoid macular edema following cataract surgery, bilateral: Secondary | ICD-10-CM | POA: Diagnosis not present

## 2022-01-07 DIAGNOSIS — H35373 Puckering of macula, bilateral: Secondary | ICD-10-CM | POA: Diagnosis not present

## 2022-01-07 DIAGNOSIS — H43813 Vitreous degeneration, bilateral: Secondary | ICD-10-CM | POA: Diagnosis not present

## 2022-01-07 DIAGNOSIS — H35033 Hypertensive retinopathy, bilateral: Secondary | ICD-10-CM | POA: Diagnosis not present

## 2022-01-07 NOTE — Telephone Encounter (Signed)
Pt is calling to check the status of the refill on: losartan (COZAAR) 100 MG tablet  Pharmacy: [id number] - [place], [place] - [address] AT [facility] OF [address]  LOV 07/19/21

## 2022-01-08 ENCOUNTER — Other Ambulatory Visit: Payer: Self-pay

## 2022-01-25 ENCOUNTER — Other Ambulatory Visit: Payer: Self-pay | Admitting: Internal Medicine

## 2022-01-25 DIAGNOSIS — Z1231 Encounter for screening mammogram for malignant neoplasm of breast: Secondary | ICD-10-CM

## 2022-01-28 ENCOUNTER — Ambulatory Visit
Admission: RE | Admit: 2022-01-28 | Discharge: 2022-01-28 | Disposition: A | Discharge status: Home / Self Care | Payer: PPO | Source: Ambulatory Visit | Attending: Internal Medicine | Admitting: Internal Medicine

## 2022-01-28 DIAGNOSIS — Z1231 Encounter for screening mammogram for malignant neoplasm of breast: Secondary | ICD-10-CM

## 2022-02-19 ENCOUNTER — Other Ambulatory Visit: Payer: Self-pay | Admitting: Internal Medicine

## 2022-04-02 DIAGNOSIS — K219 Gastro-esophageal reflux disease without esophagitis: Secondary | ICD-10-CM | POA: Diagnosis not present

## 2022-04-02 DIAGNOSIS — J4530 Mild persistent asthma, uncomplicated: Secondary | ICD-10-CM | POA: Diagnosis not present

## 2022-04-02 DIAGNOSIS — J300 Vasomotor rhinitis: Secondary | ICD-10-CM | POA: Diagnosis not present

## 2022-04-19 ENCOUNTER — Telehealth: Payer: PPO

## 2022-04-30 ENCOUNTER — Telehealth: Payer: Self-pay | Admitting: Internal Medicine

## 2022-04-30 NOTE — Telephone Encounter (Signed)
Caller & Relationship to patient:  Self   Call back number: [phone]   Date of last office visit:  08/08/2021   Date of next office visit:  07/22/2022   Medication(s) to be refilled: dilitiazem 120 mg.        Preferred Pharmacy: [facility] on [address]

## 2022-05-01 ENCOUNTER — Other Ambulatory Visit: Payer: Self-pay

## 2022-05-01 MED ORDER — DILTIAZEM HCL ER BEADS 120 MG PO CP24: 120.0000 mg | ORAL_CAPSULE | Freq: Every day | ORAL | 3 refills | Status: DC

## 2022-05-01 NOTE — Telephone Encounter (Signed)
Due for refill I do not see any question here?

## 2022-05-01 NOTE — Telephone Encounter (Signed)
Patient is waiting on this refill.

## 2022-05-01 NOTE — Telephone Encounter (Signed)
Refill has been sent in.  

## 2022-05-02 ENCOUNTER — Other Ambulatory Visit: Payer: Self-pay | Admitting: Internal Medicine

## 2022-06-02 ENCOUNTER — Other Ambulatory Visit: Payer: Self-pay | Admitting: Internal Medicine

## 2022-06-08 ENCOUNTER — Other Ambulatory Visit: Payer: Self-pay | Admitting: Internal Medicine

## 2022-06-18 ENCOUNTER — Encounter (HOSPITAL_COMMUNITY): Payer: Self-pay

## 2022-06-18 ENCOUNTER — Ambulatory Visit (HOSPITAL_COMMUNITY)
Admission: EM | Admit: 2022-06-18 | Discharge: 2022-06-18 | Disposition: A | Discharge status: Home / Self Care | Payer: PPO | Attending: Physician Assistant | Admitting: Physician Assistant

## 2022-06-18 ENCOUNTER — Ambulatory Visit (INDEPENDENT_AMBULATORY_CARE_PROVIDER_SITE_OTHER): Payer: PPO

## 2022-06-18 ENCOUNTER — Ambulatory Visit (HOSPITAL_COMMUNITY): Payer: PPO

## 2022-06-18 DIAGNOSIS — I10 Essential (primary) hypertension: Secondary | ICD-10-CM | POA: Diagnosis not present

## 2022-06-18 DIAGNOSIS — M19012 Primary osteoarthritis, left shoulder: Secondary | ICD-10-CM | POA: Diagnosis not present

## 2022-06-18 DIAGNOSIS — M25412 Effusion, left shoulder: Secondary | ICD-10-CM

## 2022-06-18 DIAGNOSIS — M25512 Pain in left shoulder: Secondary | ICD-10-CM

## 2022-06-18 DIAGNOSIS — M7989 Other specified soft tissue disorders: Secondary | ICD-10-CM | POA: Diagnosis not present

## 2022-06-18 DIAGNOSIS — J4521 Mild intermittent asthma with (acute) exacerbation: Secondary | ICD-10-CM | POA: Diagnosis not present

## 2022-06-18 MED ORDER — ALBUTEROL SULFATE HFA 108 (90 BASE) MCG/ACT IN AERS
INHALATION_SPRAY | RESPIRATORY_TRACT | Status: AC
  Filled 2022-06-18: qty 6.7

## 2022-06-18 MED ORDER — ALBUTEROL SULFATE HFA 108 (90 BASE) MCG/ACT IN AERS
2.0000 | INHALATION_SPRAY | Freq: Once | RESPIRATORY_TRACT | Status: AC
  Administered 2022-06-18: 2 via RESPIRATORY_TRACT

## 2022-06-18 MED ORDER — PREDNISONE 10 MG (21) PO TBPK: ORAL_TABLET | ORAL | 0 refills | Status: DC

## 2022-06-18 NOTE — Discharge Instructions (Addendum)
Your x-ray showed significant arthritis which is likely contributing to the swelling and pain.  Start prednisone taper as prescribed.  Do not take NSAIDs with this medication including aspirin, ibuprofen/Advil, naproxen/Aleve.  You can use acetaminophen/Tylenol for breakthrough pain.  Follow-up with orthopedics if symptoms or not improving quickly.  Call them to schedule an appointment.  If anything worsens please return for reevaluation.  I believe that your wheezing symptoms are related to an asthma exacerbation likely from a recent viral infection.  Please continue using your albuterol as well as your [facility] as previously prescribed.  Start prednisone taper as this will help with your symptoms.  If you have any worsening symptoms including shortness of breath, chest pain, fever, persistent wheezing despite medication you should be seen immediately.  Your blood pressure is very elevated.  Please take your blood pressure medication when you get home.  Monitor this at home.  Follow-up with your primary care within a week for reevaluation.  Avoid decongestants, caffeine, sodium, NSAIDs (aspirin, ibuprofen/Advil, naproxen/Aleve).  If you develop any chest pain, shortness of breath, headache, vision change, dizziness in setting of high blood pressure you need to go to the emergency room.

## 2022-06-18 NOTE — ED Provider Notes (Signed)
[place]    CSN: [id number] Arrival date & time: 06/18/22  1704      History   Chief Complaint Chief Complaint  Patient presents with   Shoulder Pain    HPI Dominique Johnston is a 77 y.o. female.   Presents today with several concerns.  She reports a 1 week history of left shoulder pain.  Denies any known injury or increase in activity prior to symptom onset.  She is right-handed.  She does have some numbness and paresthesias in bilateral hands but reports that this is at baseline related to carpal tunnel.  She has tried Tylenol and ibuprofen without improvement of symptoms.  Reports pain is rated 9 on a 0-10 pain scale, described as throbbing, no aggravating relieving factors identified.  She is able to move the arm but is restricted due to pain.  Denies any previous injury or surgery involving her left shoulder.  Blood pressure is elevated.  She does have a history of hypertension and takes antihypertensive medications regularly.  She typically takes them at night and will be due for her dose in a few hours.  She has taken a few doses of NSAID due to pain but denies any increase in sodium, caffeine, decongestant use.  She denies any current headache, chest pain, shortness of breath, visual disturbance, dizziness.  In addition, patient reports a several week history of increased wheezing and cough.  She has a history of asthma and believes that this was flared by recent illness.  She denies any fever, shortness of breath, chest pain, nausea, vomiting.  Has used her albuterol inhaler intermittently with minimal improvement of symptoms.  She has been taking Breo Ellipta for maintenance as prescribed.  She denies any recent antibiotic or steroid use.  She denies history of diabetes.    Past Medical History:  Diagnosis Date   Allergy    rhinitis   Asthma    Bouchard nodes (DJD hand)    Bronchitis    recurrent   Cataract    Depression    DJD (degenerative joint  disease) of knee    DJD (degenerative joint disease), lumbosacral    GERD (gastroesophageal reflux disease)    Headache(784.0)    Headache(784.0)    Hyperlipidemia    Hypertension    IBS (irritable bowel syndrome)    Sinusitis, chronic     Patient Active Problem List   Diagnosis Date Noted   Rash 01/10/2021   Left hip pain 03/22/2019   Mild intermittent asthma 10/08/2016   Routine health maintenance 10/25/2012   Borderline high cholesterol 01/19/2010   G E REFLUX 12/10/2007   Generalized anxiety disorder 12/04/2007   Essential hypertension 12/01/2006   Allergic rhinitis 12/01/2006   IRRITABLE BOWEL SYNDROME 12/01/2006   Osteoarthritis 12/01/2006    Past Surgical History:  Procedure Laterality Date   BREAST EXCISIONAL BIOPSY Left 1998   CATARACT EXTRACTION Right    04/21/18   ORIF ANKLE FRACTURE  '07   Left ankle   SEPTOPLASTY  2000   TONSILLECTOMY     transnasal laryngoscopy  2005   TUBAL LIGATION  1977    OB History   No obstetric history on file.      Home Medications    Prior to Admission medications   Medication Sig Start Date End Date Taking? Authorizing Provider  predniSONE (STERAPRED UNI-PAK 21 TAB) 10 MG (21) TBPK tablet As directed 06/18/22  Yes Aubrey Voong K, PA-C  albuterol (PROVENTIL HFA;VENTOLIN HFA) 108 (90  Base) MCG/ACT inhaler Inhale 2 puffs into the lungs every 6 (six) hours as needed. 03/28/16   Hoyt Koch, MD  Ascorbic Acid (VITAMIN C PO) Take 1 tablet by mouth daily.    [provider]  Adair Patter 782-95 MCG/INH AEPB INHALE 1 PUFF PO QD 02/27/16   [provider]  buPROPion (WELLBUTRIN XL) 150 MG 24 hr tablet Take 1 tablet (150 mg total) by mouth daily. Annual appt due in March must see provider for future refills 06/10/22   Hoyt Koch, MD  clonazePAM (KLONOPIN) 0.5 MG tablet TAKE 1 TABLET(0.5 MG) BY MOUTH DAILY AS NEEDED FOR ANXIETY 05/02/22   Hoyt Koch, MD  Cyanocobalamin (B-12 PO) Take by  mouth daily.    [provider]  diclofenac Sodium (VOLTAREN) 1 % GEL Apply 4 g topically 4 (four) times daily. 02/26/20   Zigmund Gottron, NP  dicyclomine (BENTYL) 20 MG tablet TAKE 1 TABLET(20 MG) BY MOUTH FOUR TIMES DAILY BEFORE MEALS AND AT BEDTIME 02/20/22   Hoyt Koch, MD  diltiazem [facility]) 120 MG 24 hr capsule Take 1 capsule (120 mg total) by mouth daily. 05/01/22   Hoyt Koch, MD  dorzolamide (TRUSOPT) 2 % ophthalmic solution Place into both eyes 2 (two) times daily.  01/13/14   [provider]  escitalopram (LEXAPRO) 10 MG tablet TAKE 2 TABLETS(20 MG) BY MOUTH DAILY 06/03/22   Hoyt Koch, MD  fluticasone [facility]) 50 MCG/ACT nasal spray Place 1 spray into both nostrils daily. 07/22/20   Hazel Sams, PA-C  furosemide (LASIX) 40 MG tablet TAKE 1 TABLET(40 MG) BY MOUTH DAILY 01/09/21   Hoyt Koch, MD  gabapentin (NEURONTIN) 100 MG capsule Take 100 mg by mouth at bedtime. 10/19/19   Verner Chol, MD  ibuprofen (ADVIL) 800 MG tablet TAKE 1 TABLET(800 MG) BY MOUTH DAILY AS NEEDED FOR MODERATE PAIN 01/18/20   Hoyt Koch, MD  ipratropium (ATROVENT) 0.06 % nasal spray Place 2 sprays into both nostrils 4 (four) times daily. 05/17/18   Tasia Catchings, Amy V, PA-C  ketorolac (ACULAR) 0.4 % SOLN 1 drop 4 (four) times daily.    [provider]  latanoprost (XALATAN) 0.005 % ophthalmic solution Place 1 drop into both eyes daily. 10/22/12   Norins, Heinz Knuckles, MD  levalbuterol Penne Lash) 1.25 MG/3ML nebulizer solution Take 1.25 mg by nebulization as needed. Patient not taking: Reported on 10/19/2021 10/22/12   Neena Rhymes, MD  levocetirizine (XYZAL) 5 MG tablet Take 2.5 mg by mouth every evening. 02/23/18   [provider]  losartan (COZAAR) 100 MG tablet TAKE 1 TABLET BY MOUTH EVERY DAY 01/08/22   Hoyt Koch, MD  multivitamin [facility]) tablet Take 1 tablet by mouth daily.    [provider]  omeprazole  (PRILOSEC) 40 MG capsule TAKE 1 CAPSULE BY MOUTH EVERY DAY 09/03/21   Hoyt Koch, MD  potassium chloride (KLOR-CON) 8 MEQ tablet TAKE 1 TABLET(8 MEQ) BY MOUTH DAILY 08/07/21   Hoyt Koch, MD  prednisoLONE acetate (PRED FORTE) 1 % ophthalmic suspension Place 1 drop into the left eye 4 (four) times daily.    [provider]  sodium chloride (OCEAN) 0.65 % SOLN nasal spray Place 1 spray into both nostrils as needed for congestion. 09/21/19   Darr, Edison Nasuti, PA-C  triamcinolone cream (KENALOG) 0.1 % APPLY TOPICALLY TWICE DAILY AS DIRECTED. 12/03/21   Hoyt Koch, MD  diltiazem (DILACOR XR) 120 MG 24 hr capsule Take  1 capsule (120 mg total) by mouth daily. 10/22/11 10/11/12  Norins, Heinz Knuckles, MD    Family History Family History  Problem Relation Age of Onset   Hypertension Father    Coronary artery disease Father        MI/ Fatal   Breast cancer Neg Hx    Diabetes Neg Hx    Colon cancer Neg Hx     Social History Social History   Tobacco Use   Smoking status: Never   Smokeless tobacco: Never  Vaping Use   Vaping Use: Never used  Substance Use Topics   Alcohol use: Never   Drug use: Not Currently     Allergies   Enalapril maleate and Sulfamethoxazole-trimethoprim   Review of Systems Review of Systems  Constitutional:  Positive for activity change. Negative for appetite change, fatigue and fever.  Respiratory:  Positive for chest tightness and shortness of breath. Negative for cough.   Cardiovascular:  Negative for chest pain.  Musculoskeletal:  Positive for arthralgias and joint swelling. Negative for myalgias.  Neurological:  Negative for dizziness, light-headedness and headaches.     Physical Exam Triage Vital Signs ED Triage Vitals  Enc Vitals Group     BP 06/18/22 1751 (!) 186/84     Pulse Rate 06/18/22 1751 67     Resp 06/18/22 1751 16     Temp 06/18/22 1751 98.3 F (36.8 C)     Temp Source 06/18/22 1751 Oral     SpO2 06/18/22  1751 96 %     Weight --      Height --      Head Circumference --      Peak Flow --      Pain Score 06/18/22 1754 9     Pain Loc --      Pain Edu? --      Excl. in [place]? --    No data found.  Updated Vital Signs BP (!) 190/114 (BP Location: Left Arm)   Pulse (!) 57   Temp 98.2 F (36.8 C) (Oral)   Resp 16   SpO2 96%   Visual Acuity Right Eye Distance:   Left Eye Distance:   Bilateral Distance:    Right Eye Near:   Left Eye Near:    Bilateral Near:     Physical Exam Vitals reviewed.  Constitutional:      General: She is awake. She is not in acute distress.    Appearance: Normal appearance. She is well-developed. She is not ill-appearing.     Comments: Very pleasant female appears stated age in no acute distress sitting comfortably in exam room  HENT:     Head: Normocephalic and atraumatic.  Cardiovascular:     Rate and Rhythm: Normal rate and regular rhythm.     Heart sounds: Normal heart sounds, S1 normal and S2 normal. No murmur heard. Pulmonary:     Effort: Pulmonary effort is normal.     Breath sounds: Wheezing present. No rhonchi or rales.     Comments: Widespread wheezing Abdominal:     Palpations: Abdomen is soft.     Tenderness: There is no abdominal tenderness.  Musculoskeletal:     Left shoulder: Swelling, effusion and tenderness present. No bony tenderness. Decreased range of motion. Normal strength.     Comments: Left shoulder: Significant swelling and effusion.  Decreased range of motion with internal rotation and overhead flexion.  Strength 5/5 bilateral upper extremities.  Hand neurovascularly intact.  Normal pincer grip strength.  Negative drop arm and empty can.  Difficulty with Apley scratch test related to decreased range of motion.  Psychiatric:        Behavior: Behavior is cooperative.      UC Treatments / Results  Labs (all labs ordered are listed, but only abnormal results are displayed) Labs Reviewed - No data to  display  EKG   Radiology DG Shoulder Left  Result Date: 06/18/2022 CLINICAL DATA:  Pain, swelling left shoulder EXAM: LEFT SHOULDER - 2+ VIEW COMPARISON:  None Available. FINDINGS: Moderate to advanced degenerative changes in the left [facility] and glenohumeral joints with joint space narrowing and spurring. No acute bony abnormality. Specifically, no fracture, subluxation, or dislocation. Soft tissues are intact. IMPRESSION: Moderate to advanced degenerative changes in the left shoulder. No acute bony abnormality. Electronically Signed   By: Charlett Nose M.D.   On: 06/18/2022 19:12    Procedures Procedures (including critical care time)  Medications Ordered in UC Medications  albuterol (VENTOLIN HFA) 108 (90 Base) MCG/ACT inhaler 2 puff (2 puffs Inhalation Given 06/18/22 1847)    Initial Impression / Assessment and Plan / UC Course  I have reviewed the triage vital signs and the nursing notes.  Pertinent labs & imaging results that were available during my care of the patient were reviewed by me and considered in my medical decision making (see chart for details).     X-ray was obtained that showed significant degenerative changes without acute abnormality.  Suspect this is likely cause of pain as well as effusion.  Recommended conservative treatment measures including heat and gentle stretch.  Will start prednisone taper.  She was instructed to take NSAIDs with this medication.  Can use Tylenol for breakthrough pain.  Recommended follow-up with orthopedics if symptoms or not improving quickly.  Discussed that if anything worsens or changes she is to return for reevaluation.  Strict return precautions given.  Patient had improvement of wheezing with dose of albuterol in clinic.  Discussed that prednisone prescribed for her shoulder will also help with her asthma.  She is to continue her maintenance medications as previously prescribed.  No evidence of acute infection on physical exam that warrant  initiation of antibiotics.  Recommend she follow-up closely with her primary care.  Discussed that if she has any worsening symptoms including worsening cough, shortness of breath, wheezing despite bronchodilators she should be seen immediately.  Strict return precautions given.  Blood pressure is elevated today.  Patient denies any signs or symptoms of endorgan damage.  She was encouraged to avoid NSAIDs, caffeine, sodium, decongestants.  She is to take her blood pressure medication when she returns home and monitor this closely.  Discussed she should follow-up with her primary care within a week.  If she develops any chest pain, shortness of breath, headache, vision change, dizziness in setting of high blood pressure she is to go to the emergency room.  Final Clinical Impressions(s) / UC Diagnoses   Final diagnoses:  Primary osteoarthritis of left shoulder  Effusion of joint of left shoulder  Mild intermittent asthma with acute exacerbation  Elevated blood pressure reading in office with diagnosis of hypertension     Discharge Instructions      Your x-ray showed significant arthritis which is likely contributing to the swelling and pain.  Start prednisone taper as prescribed.  Do not take NSAIDs with this medication including aspirin, ibuprofen/Advil, naproxen/Aleve.  You can use acetaminophen/Tylenol for breakthrough pain.  Follow-up with orthopedics if symptoms or not improving  quickly.  Call them to schedule an appointment.  If anything worsens please return for reevaluation.  I believe that your wheezing symptoms are related to an asthma exacerbation likely from a recent viral infection.  Please continue using your albuterol as well as your [facility] as previously prescribed.  Start prednisone taper as this will help with your symptoms.  If you have any worsening symptoms including shortness of breath, chest pain, fever, persistent wheezing despite medication you should be seen  immediately.  Your blood pressure is very elevated.  Please take your blood pressure medication when you get home.  Monitor this at home.  Follow-up with your primary care within a week for reevaluation.  Avoid decongestants, caffeine, sodium, NSAIDs (aspirin, ibuprofen/Advil, naproxen/Aleve).  If you develop any chest pain, shortness of breath, headache, vision change, dizziness in setting of high blood pressure you need to go to the emergency room.     ED Prescriptions     Medication Sig Dispense Auth. Provider   predniSONE (STERAPRED UNI-PAK 21 TAB) 10 MG (21) TBPK tablet As directed 21 tablet Amillya Chavira K, PA-C      PDMP not reviewed this encounter.   Jeani Hawking, PA-C 06/18/22 1934

## 2022-06-18 NOTE — ED Triage Notes (Signed)
Patient c/o left shoulder pain x 1 week. Patient denies any injury.   Patient has been taking Tylenol and Ibuprofen. The last dose was last night.

## 2022-06-20 ENCOUNTER — Ambulatory Visit (INDEPENDENT_AMBULATORY_CARE_PROVIDER_SITE_OTHER): Payer: PPO

## 2022-06-20 ENCOUNTER — Other Ambulatory Visit: Payer: Self-pay | Admitting: Internal Medicine

## 2022-06-20 VITALS — Ht 66.0 in | Wt 182.0 lb

## 2022-06-20 DIAGNOSIS — Z0000 Encounter for general adult medical examination without abnormal findings: Secondary | ICD-10-CM | POA: Diagnosis not present

## 2022-06-20 NOTE — Progress Notes (Signed)
Virtual Visit via Telephone Note  I connected with  Dominique Johnston on 06/20/22 at 10:00 AM EST by telephone and verified that I am speaking with the correct person using two identifiers.  Location: Patient: Home Provider: Adrian Persons participating in the virtual visit: [place]   I discussed the limitations, risks, security and privacy concerns of performing an evaluation and management service by telephone and the availability of in person appointments. The patient expressed understanding and agreed to proceed.  Interactive audio and video telecommunications were attempted between this nurse and patient, however failed, due to patient having technical difficulties OR patient did not have access to video capability.  We continued and completed visit with audio only.  Some vital signs may be absent or patient reported.   Sheral Flow, LPN  Subjective:   Dominique Johnston is a 77 y.o. female who presents for Medicare Annual (Subsequent) preventive examination.  Review of Systems     Cardiac Risk Factors include: advanced age (>46mn, >>38women);family history of premature cardiovascular disease;hypertension     Objective:    Today's Vitals   06/20/22 1002  Height: 5' 6"$  (1.676 m)  PainSc: 9   PainLoc: Shoulder   Body mass index is 29.38 kg/m.     06/20/2022   10:16 AM 06/19/2021   10:08 AM 05/09/2020    3:11 PM 04/27/2019    1:18 PM 04/01/2018    9:41 AM 03/31/2017    9:44 AM 10/08/2016    6:40 PM  Advanced Directives  Does Patient Have a Medical Advance Directive? No No No No No No No  Does patient want to make changes to medical advance directive?    Yes (ED - Information included in AVS) Yes (ED - Information included in AVS)    Would patient like information on creating a medical advance directive? No - Patient declined Yes (MAU/Ambulatory/Procedural Areas - Information given) Yes (MAU/Ambulatory/Procedural Areas -  Information given)   Yes (ED - Information included in AVS)     Current Medications (verified) Outpatient Encounter Medications as of 06/20/2022  Medication Sig   albuterol (PROVENTIL HFA;VENTOLIN HFA) 108 (90 Base) MCG/ACT inhaler Inhale 2 puffs into the lungs every 6 (six) hours as needed.   Ascorbic Acid (VITAMIN C PO) Take 1 tablet by mouth daily.   BREO ELLIPTA 200-25 MCG/INH AEPB INHALE 1 PUFF PO QD   buPROPion (WELLBUTRIN XL) 150 MG 24 hr tablet Take 1 tablet (150 mg total) by mouth daily. Annual appt due in March must see provider for future refills   clonazePAM (KLONOPIN) 0.5 MG tablet TAKE 1 TABLET(0.5 MG) BY MOUTH DAILY AS NEEDED FOR ANXIETY   Cyanocobalamin (B-12 PO) Take by mouth daily.   diclofenac Sodium (VOLTAREN) 1 % GEL Apply 4 g topically 4 (four) times daily.   dicyclomine (BENTYL) 20 MG tablet TAKE 1 TABLET(20 MG) BY MOUTH FOUR TIMES DAILY BEFORE MEALS AND AT BEDTIME   diltiazem (TIAZAC) 120 MG 24 hr capsule Take 1 capsule (120 mg total) by mouth daily.   dorzolamide (TRUSOPT) 2 % ophthalmic solution Place into both eyes 2 (two) times daily.    escitalopram (LEXAPRO) 10 MG tablet TAKE 2 TABLETS(20 MG) BY MOUTH DAILY   fluticasone (FLONASE) 50 MCG/ACT nasal spray Place 1 spray into both nostrils daily.   furosemide (LASIX) 40 MG tablet TAKE 1 TABLET(40 MG) BY MOUTH DAILY   gabapentin (NEURONTIN) 100 MG capsule Take 100 mg by mouth at bedtime.   ibuprofen (ADVIL)  800 MG tablet TAKE 1 TABLET(800 MG) BY MOUTH DAILY AS NEEDED FOR MODERATE PAIN   ipratropium (ATROVENT) 0.06 % nasal spray Place 2 sprays into both nostrils 4 (four) times daily.   ketorolac (ACULAR) 0.4 % SOLN 1 drop 4 (four) times daily.   latanoprost (XALATAN) 0.005 % ophthalmic solution Place 1 drop into both eyes daily.   levalbuterol (XOPENEX) 1.25 MG/3ML nebulizer solution Take 1.25 mg by nebulization as needed. (Patient not taking: Reported on 10/19/2021)   levocetirizine (XYZAL) 5 MG tablet Take 2.5 mg by  mouth every evening.   losartan (COZAAR) 100 MG tablet TAKE 1 TABLET BY MOUTH EVERY DAY   multivitamin (THERAGRAN) tablet Take 1 tablet by mouth daily.   omeprazole (PRILOSEC) 40 MG capsule TAKE 1 CAPSULE BY MOUTH EVERY DAY   potassium chloride (KLOR-CON) 8 MEQ tablet TAKE 1 TABLET(8 MEQ) BY MOUTH DAILY   prednisoLONE acetate (PRED FORTE) 1 % ophthalmic suspension Place 1 drop into the left eye 4 (four) times daily.   predniSONE (STERAPRED UNI-PAK 21 TAB) 10 MG (21) TBPK tablet As directed   sodium chloride (OCEAN) 0.65 % SOLN nasal spray Place 1 spray into both nostrils as needed for congestion.   triamcinolone cream (KENALOG) 0.1 % APPLY TOPICALLY TWICE DAILY AS DIRECTED.   [DISCONTINUED] diltiazem (DILACOR XR) 120 MG 24 hr capsule Take 1 capsule (120 mg total) by mouth daily.   No facility-administered encounter medications on file as of 06/20/2022.    Allergies (verified) Enalapril maleate and Sulfamethoxazole-trimethoprim   History: Past Medical History:  Diagnosis Date   Allergy    rhinitis   Asthma    Bouchard nodes (DJD hand)    Bronchitis    recurrent   Cataract    Depression    DJD (degenerative joint disease) of knee    DJD (degenerative joint disease), lumbosacral    GERD (gastroesophageal reflux disease)    Headache(784.0)    Headache(784.0)    Hyperlipidemia    Hypertension    IBS (irritable bowel syndrome)    Sinusitis, chronic    Past Surgical History:  Procedure Laterality Date   BREAST EXCISIONAL BIOPSY Left 1998   CATARACT EXTRACTION Right    04/21/18   ORIF ANKLE FRACTURE  '07   Left ankle   SEPTOPLASTY  2000   TONSILLECTOMY     transnasal laryngoscopy  2005   TUBAL LIGATION  1977   Family History  Problem Relation Age of Onset   Hypertension Father    Coronary artery disease Father        MI/ Fatal   Breast cancer Neg Hx    Diabetes Neg Hx    Colon cancer Neg Hx    Social History   Socioeconomic History   Marital status: Divorced     Spouse name: Not on file   Number of children: 2   Years of education: 61   Highest education level: Not on file  Occupational History    Employer: [employer]    Comment: retired  Tobacco Use   Smoking status: Never   Smokeless tobacco: Never  Vaping Use   Vaping Use: Never used  Substance and Sexual Activity   Alcohol use: Never   Drug use: Not Currently   Sexual activity: Never  Other Topics Concern   Not on file  Social History Narrative   [employer] -BS, has had additional course work.  married '71- after 29 years husband left, divorce final in '06.  1 son- '74, 1  daughter '76; 2 grandchildren. 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 Lives in her own home, daughter and grandchild home with her. ACP - HCPOA dtr - tiffany (c) [phone] - [id number]. CPR - ?, no mechanical ventilation except short term care, no prolonged artificial nutrition.    Social Determinants of Health   Financial Resource Strain: Low Risk  (06/20/2022)   Overall Financial Resource Strain (CARDIA)    Difficulty of Paying Living Expenses: Not hard at all  Food Insecurity: No Food Insecurity (06/20/2022)   Hunger Vital Sign    Worried About Running Out of Food in the Last Year: Never true    Ran Out of Food in the Last Year: Never true  Transportation Needs: No Transportation Needs (06/20/2022)   PRAPARE - Hydrologist (Medical): No    Lack of Transportation (Non-Medical): No  Physical Activity: Inactive (06/20/2022)   Exercise Vital Sign    Days of Exercise per Week: 0 days    Minutes of Exercise per Session: 0 min  Stress: No Stress Concern Present (06/20/2022)   [place]    Feeling of Stress : Not at all  Social Connections: Moderately Integrated (06/20/2022)   Social Connection and Isolation Panel [NHANES]    Frequency of Communication with Friends and Family: More than  three times a week    Frequency of Social Gatherings with Friends and Family: Once a week    Attends Religious Services: 1 to 4 times per year    Active Member of [employer] or Organizations: No    Attends Music therapist: 1 to 4 times per year    Marital Status: Divorced    Tobacco Counseling Counseling given: Not Answered   Clinical Intake:     Pain : 0-10 Pain Score: 9  Pain Type: Acute pain Pain Location: Shoulder Pain Orientation: Right Pain Descriptors / Indicators: Throbbing, Constant     Diabetes: No  How often do you need to have someone help you when you read instructions, pamphlets, or other written materials from your doctor or pharmacy?: 1 - Never What is the last grade level you completed in school?: BS Degree from [employer]  Diabetic? No  Interpreter Needed?: No  Information entered by :: Gracyn Allor N. Afia Messenger, LPN.   Activities of Daily Living    06/20/2022   10:18 AM  In your present state of health, do you have any difficulty performing the following activities:  Hearing? 0  Vision? 0  Difficulty concentrating or making decisions? 0  Walking or climbing stairs? 0  Dressing or bathing? 0  Doing errands, shopping? 0  Preparing Food and eating ? N  Using the Toilet? N  In the past six months, have you accidently leaked urine? N  Do you have problems with loss of bowel control? N  Managing your Medications? N  Managing your Finances? N  Housekeeping or managing your Housekeeping? N    Patient Care Team: Hoyt Koch, MD as PCP - General (Internal Medicine) Mosetta Anis, MD (Allergy) Romine, Lubertha South, MD (Obstetrics and Gynecology) Verner Chol, MD as Consulting Physician (Sports Medicine) Katy Apo, MD as Consulting Physician (Ophthalmology) Charlton Haws, [facility] as Pharmacist (Pharmacist)  Indicate any recent Medical Services you may have received from other than [facility] providers in the past year (date  may be approximate).     Assessment:   This  is a routine wellness examination for Kimiyah.  Hearing/Vision screen Hearing Screening - Comments:: Denies hearing difficulties   Vision Screening - Comments:: Wears rx glasses - up to date with routine eye exams with Katy Apo, MD.   Dietary issues and exercise activities discussed: Current Exercise Habits: The patient does not participate in regular exercise at present, Exercise limited by: respiratory conditions(s)   Goals Addressed             This Visit's Progress    Client understands the importance of follow-up with providers by attending scheduled visits.        Depression Screen    06/20/2022   10:15 AM 06/19/2021   10:12 AM 05/09/2020    3:17 PM 05/09/2020    3:14 PM 04/27/2019    1:22 PM 04/01/2018    9:47 AM 03/31/2017    9:48 AM  PHQ 2/9 Scores  PHQ - 2 Score 0 0 0 0 2 4 2  $ PHQ- 9 Score     3 7 2    $ Fall Risk    06/20/2022   10:18 AM 06/19/2021   10:10 AM 05/09/2020    2:55 PM 04/27/2019    1:21 PM 04/05/2019   12:10 PM  [place] in the past year? 0 0 1 0 1  Comment     Emmi Telephone Survey: data to providers prior to load  Number falls in past yr: 0 0 0 0 1  Comment     Emmi Telephone Survey Actual Response = 2  Injury with Fall? 0 0 0 0 0  Risk for fall due to : No Fall Risks No Fall Risks Other (Comment) Impaired balance/gait;Impaired mobility   Risk for fall due to: Comment   running behind cat    Follow up Falls prevention discussed Falls evaluation completed Falls evaluation completed Falls prevention discussed     FALL RISK PREVENTION PERTAINING TO THE HOME:  Any stairs in or around the home? No  If so, are there any without handrails? No  Home free of loose throw rugs in walkways, pet beds, electrical cords, etc? Yes  Adequate lighting in your home to reduce risk of falls? Yes   ASSISTIVE DEVICES UTILIZED TO PREVENT FALLS:  Life alert? No  Use of a cane, walker or w/c? No  Grab  bars in the bathroom? No  Shower chair or bench in shower? No  Elevated toilet seat or a handicapped toilet? Yes   TIMED UP AND GO:  Was the test performed? No . Phone Visit  Cognitive Function:        06/20/2022   10:20 AM  6CIT Screen  What Year? 0 points  What month? 0 points  What time? 0 points  Count back from 20 0 points  Months in reverse 0 points  Repeat phrase 0 points  Total Score 0 points    Immunizations Immunization History  Administered Date(s) Administered   Fluad Quad(high Dose 65+) 02/16/2019, 03/04/2021   Influenza Split 04/05/2011   Influenza Whole 04/22/2007, 02/23/2008, 02/21/2009, 03/06/2009, 02/28/2010, 01/26/2014   Influenza, High Dose Seasonal PF 03/06/2017   Influenza,inj,Quad PF,6+ Mos 01/17/2016   Influenza-Unspecified 02/21/2015, 02/10/2018, 03/13/2020, 02/04/2022   PFIZER(Purple Top)SARS-COV-2 Vaccination 07/28/2019, 08/25/2019, 03/27/2020, 12/12/2020   Pfizer Covid-19 Vaccine Bivalent Booster 28yr & up 03/04/2021, 02/04/2022   Pneumococcal Conjugate-13 03/16/2015   Pneumococcal Polysaccharide-23 05/29/2005, 04/05/2011, 06/19/2020   Respiratory Syncytial Virus Vaccine,Recomb Aduvanted(Arexvy) 02/25/2022   Tdap 05/03/2019   Zoster Recombinat (Shingrix)  02/25/2022   Zoster, Live 07/26/2009    TDAP status: Up to date  Flu Vaccine status: Up to date  Pneumococcal vaccine status: Up to date  Covid-19 vaccine status: Completed vaccines  Qualifies for Shingles Vaccine? Yes   Zostavax completed Yes   Shingrix Completed?: No.    Education has been provided regarding the importance of this vaccine. Patient has been advised to call insurance company to determine out of pocket expense if they have not yet received this vaccine. Advised may also receive vaccine at local pharmacy or Health Dept. Verbalized acceptance and understanding.  Screening Tests Health Maintenance  Topic Date Due   COVID-19 Vaccine (7 - 2023-24 season) 04/01/2022    Zoster Vaccines- Shingrix (2 of 2) 04/22/2022   Medicare Annual Wellness (AWV)  06/21/2023   DTaP/Tdap/Td (2 - Td or Tdap) 05/02/2029   Pneumonia Vaccine 55+ Years old  Completed   INFLUENZA VACCINE  Completed   DEXA SCAN  Completed   Hepatitis C Screening  Completed   HPV VACCINES  Aged Out   COLONOSCOPY (Pts 45-55yr Insurance coverage will need to be confirmed)  [place] Due  Topic Date Due   COVID-19 Vaccine (7 - 2023-24 season) 04/01/2022   Zoster Vaccines- Shingrix (2 of 2) 04/22/2022    Colorectal cancer screening: No longer required.   Mammogram status: Completed 01/28/2022. Repeat every year  Bone Density status: Completed 03/28/2016. Results reflect: Bone density results: NORMAL. Repeat every 5 years.  Lung Cancer Screening: (Low Dose CT Chest recommended if Age 77-80years, 30 pack-year currently smoking OR have quit w/in 15years.) does not qualify.   Lung Cancer Screening Referral: no  Additional Screening:  Hepatitis C Screening: does qualify; Completed 10/22/2012  Vision Screening: Recommended annual ophthalmology exams for early detection of glaucoma and other disorders of the eye. Is the patient up to date with their annual eye exam?  Yes  Who is the provider or what is the name of the office in which the patient attends annual eye exams? GKaty Apo MD. If pt is not established with a provider, would they like to be referred to a provider to establish care? No .   Dental Screening: Recommended annual dental exams for proper oral hygiene  Community Resource Referral / Chronic Care Management: CRR required this visit?  No   CCM required this visit?  No      Plan:     I have personally reviewed and noted the following in the patient's chart:   Medical and social history Use of alcohol, tobacco or illicit drugs  Current medications and supplements including opioid prescriptions. Patient is not currently  taking opioid prescriptions. Functional ability and status Nutritional status Physical activity Advanced directives List of other physicians Hospitalizations, surgeries, and ER visits in previous 12 months Vitals Screenings to include cognitive, depression, and falls Referrals and appointments  In addition, I have reviewed and discussed with patient certain preventive protocols, quality metrics, and best practice recommendations. A written personalized care plan for preventive services as well as general preventive health recommendations were provided to patient.     SSheral Flow LPN   [id number]  Nurse Notes: N/A

## 2022-06-20 NOTE — Patient Instructions (Signed)
Dominique Johnston , Thank you for taking time to come for your Medicare Wellness Visit. I appreciate your ongoing commitment to your health goals. Please review the following plan we discussed and let me know if I can assist you in the future.   These are the goals we discussed:  Goals      Client understands the importance of follow-up with providers by attending scheduled visits.        This is a list of the screening recommended for you and due dates:  Health Maintenance  Topic Date Due   COVID-19 Vaccine (7 - 2023-24 season) 04/01/2022   Zoster (Shingles) Vaccine (2 of 2) 04/22/2022   Medicare Annual Wellness Visit  06/21/2023   DTaP/Tdap/Td vaccine (2 - Td or Tdap) 05/02/2029   Pneumonia Vaccine  Completed   Flu Shot  Completed   DEXA scan (bone density measurement)  Completed   Hepatitis C Screening: USPSTF Recommendation to screen - Ages 64-79 yo.  Completed   HPV Vaccine  Aged Out   Colon Cancer Screening  Discontinued    Advanced directives: No  Conditions/risks identified: Yes  Next appointment: Follow up in one year for your annual wellness visit.   Preventive Care 69 Years and Older, Female Preventive care refers to lifestyle choices and visits with your health care provider that can promote health and wellness. What does preventive care include? A yearly physical exam. This is also called an annual well check. Dental exams once or twice a year. Routine eye exams. Ask your health care provider how often you should have your eyes checked. Personal lifestyle choices, including: Daily care of your teeth and gums. Regular physical activity. Eating a healthy diet. Avoiding tobacco and drug use. Limiting alcohol use. Practicing safe sex. Taking low-dose aspirin every day. Taking vitamin and mineral supplements as recommended by your health care provider. What happens during an annual well check? The services and screenings done by your health care provider during your  annual well check will depend on your age, overall health, lifestyle risk factors, and family history of disease. Counseling  Your health care provider may ask you questions about your: Alcohol use. Tobacco use. Drug use. Emotional well-being. Home and relationship well-being. Sexual activity. Eating habits. History of falls. Memory and ability to understand (cognition). Work and work Statistician. Reproductive health. Screening  You may have the following tests or measurements: Height, weight, and BMI. Blood pressure. Lipid and cholesterol levels. These may be checked every 5 years, or more frequently if you are over 46 years old. Skin check. Lung cancer screening. You may have this screening every year starting at age 4 if you have a 30-pack-year history of smoking and currently smoke or have quit within the past 15 years. Fecal occult blood test (FOBT) of the stool. You may have this test every year starting at age 59. Flexible sigmoidoscopy or colonoscopy. You may have a sigmoidoscopy every 5 years or a colonoscopy every 10 years starting at age 62. Hepatitis C blood test. Hepatitis B blood test. Sexually transmitted disease (STD) testing. Diabetes screening. This is done by checking your blood sugar (glucose) after you have not eaten for a while (fasting). You may have this done every 1-3 years. Bone density scan. This is done to screen for osteoporosis. You may have this done starting at age 75. Mammogram. This may be done every 1-2 years. Talk to your health care provider about how often you should have regular mammograms. Talk with your health  care provider about your test results, treatment options, and if necessary, the need for more tests. Vaccines  Your health care provider may recommend certain vaccines, such as: Influenza vaccine. This is recommended every year. Tetanus, diphtheria, and acellular pertussis (Tdap, Td) vaccine. You may need a Td booster every 10  years. Zoster vaccine. You may need this after age 34. Pneumococcal 13-valent conjugate (PCV13) vaccine. One dose is recommended after age 5. Pneumococcal polysaccharide (PPSV23) vaccine. One dose is recommended after age 38. Talk to your health care provider about which screenings and vaccines you need and how often you need them. This information is not intended to replace advice given to you by your health care provider. Make sure you discuss any questions you have with your health care provider. Document Released: 05/26/2015 Document Revised: 01/17/2016 Document Reviewed: 02/28/2015 Elsevier Interactive Patient Education  2017 [place] Prevention in the Home Falls can cause injuries. They can happen to people of all ages. There are many things you can do to make your home safe and to help prevent falls. What can I do on the outside of my home? Regularly fix the edges of walkways and driveways and fix any cracks. Remove anything that might make you trip as you walk through a door, such as a raised step or threshold. Trim any bushes or trees on the path to your home. Use bright outdoor lighting. Clear any walking paths of anything that might make someone trip, such as rocks or tools. Regularly check to see if handrails are loose or broken. Make sure that both sides of any steps have handrails. Any raised decks and porches should have guardrails on the edges. Have any leaves, snow, or ice cleared regularly. Use sand or salt on walking paths during winter. Clean up any spills in your garage right away. This includes oil or grease spills. What can I do in the bathroom? Use night lights. Install grab bars by the toilet and in the tub and shower. Do not use towel bars as grab bars. Use non-skid mats or decals in the tub or shower. If you need to sit down in the shower, use a plastic, non-slip stool. Keep the floor dry. Clean up any water that spills on the floor as soon as it  happens. Remove soap buildup in the tub or shower regularly. Attach bath mats securely with double-sided non-slip rug tape. Do not have throw rugs and other things on the floor that can make you trip. What can I do in the bedroom? Use night lights. Make sure that you have a light by your bed that is easy to reach. Do not use any sheets or blankets that are too big for your bed. They should not hang down onto the floor. Have a firm chair that has side arms. You can use this for support while you get dressed. Do not have throw rugs and other things on the floor that can make you trip. What can I do in the kitchen? Clean up any spills right away. Avoid walking on wet floors. Keep items that you use a lot in easy-to-reach places. If you need to reach something above you, use a strong step stool that has a grab bar. Keep electrical cords out of the way. Do not use floor polish or wax that makes floors slippery. If you must use wax, use non-skid floor wax. Do not have throw rugs and other things on the floor that can make you trip. What can  I do with my stairs? Do not leave any items on the stairs. Make sure that there are handrails on both sides of the stairs and use them. Fix handrails that are broken or loose. Make sure that handrails are as long as the stairways. Check any carpeting to make sure that it is firmly attached to the stairs. Fix any carpet that is loose or worn. Avoid having throw rugs at the top or bottom of the stairs. If you do have throw rugs, attach them to the floor with carpet tape. Make sure that you have a light switch at the top of the stairs and the bottom of the stairs. If you do not have them, ask someone to add them for you. What else can I do to help prevent falls? Wear shoes that: Do not have high heels. Have rubber bottoms. Are comfortable and fit you well. Are closed at the toe. Do not wear sandals. If you use a stepladder: Make sure that it is fully opened.  Do not climb a closed stepladder. Make sure that both sides of the stepladder are locked into place. Ask someone to hold it for you, if possible. Clearly mark and make sure that you can see: Any grab bars or handrails. First and last steps. Where the edge of each step is. Use tools that help you move around (mobility aids) if they are needed. These include: Canes. Walkers. Scooters. Crutches. Turn on the lights when you go into a dark area. Replace any light bulbs as soon as they burn out. Set up your furniture so you have a clear path. Avoid moving your furniture around. If any of your floors are uneven, fix them. If there are any pets around you, be aware of where they are. Review your medicines with your doctor. Some medicines can make you feel dizzy. This can increase your chance of falling. Ask your doctor what other things that you can do to help prevent falls. This information is not intended to replace advice given to you by your health care provider. Make sure you discuss any questions you have with your health care provider. Document Released: 02/23/2009 Document Revised: 10/05/2015 Document Reviewed: 06/03/2014 Elsevier Interactive Patient Education  2017 [employer].

## 2022-07-03 DIAGNOSIS — M79641 Pain in right hand: Secondary | ICD-10-CM | POA: Diagnosis not present

## 2022-07-03 DIAGNOSIS — M19012 Primary osteoarthritis, left shoulder: Secondary | ICD-10-CM | POA: Diagnosis not present

## 2022-07-03 DIAGNOSIS — M79642 Pain in left hand: Secondary | ICD-10-CM | POA: Diagnosis not present

## 2022-07-06 ENCOUNTER — Other Ambulatory Visit: Payer: Self-pay | Admitting: Internal Medicine

## 2022-07-08 ENCOUNTER — Other Ambulatory Visit: Payer: Self-pay | Admitting: Internal Medicine

## 2022-07-09 DIAGNOSIS — M19012 Primary osteoarthritis, left shoulder: Secondary | ICD-10-CM | POA: Diagnosis not present

## 2022-07-12 DIAGNOSIS — H35353 Cystoid macular degeneration, bilateral: Secondary | ICD-10-CM | POA: Diagnosis not present

## 2022-07-12 DIAGNOSIS — H401132 Primary open-angle glaucoma, bilateral, moderate stage: Secondary | ICD-10-CM | POA: Diagnosis not present

## 2022-07-12 DIAGNOSIS — H43813 Vitreous degeneration, bilateral: Secondary | ICD-10-CM | POA: Diagnosis not present

## 2022-07-12 DIAGNOSIS — H35033 Hypertensive retinopathy, bilateral: Secondary | ICD-10-CM | POA: Diagnosis not present

## 2022-07-12 DIAGNOSIS — H35373 Puckering of macula, bilateral: Secondary | ICD-10-CM | POA: Diagnosis not present

## 2022-07-23 ENCOUNTER — Encounter: Payer: PPO | Admitting: Internal Medicine

## 2022-07-30 ENCOUNTER — Encounter: Payer: PPO | Admitting: Internal Medicine

## 2022-08-03 ENCOUNTER — Other Ambulatory Visit: Payer: Self-pay | Admitting: Internal Medicine

## 2022-08-07 ENCOUNTER — Ambulatory Visit (INDEPENDENT_AMBULATORY_CARE_PROVIDER_SITE_OTHER): Payer: PPO | Admitting: Internal Medicine

## 2022-08-07 ENCOUNTER — Encounter: Payer: Self-pay | Admitting: Internal Medicine

## 2022-08-07 VITALS — BP 130/78 | HR 65 | Temp 98.1°F | Ht 66.0 in | Wt 193.0 lb

## 2022-08-07 DIAGNOSIS — F411 Generalized anxiety disorder: Secondary | ICD-10-CM

## 2022-08-07 DIAGNOSIS — E789 Disorder of lipoprotein metabolism, unspecified: Secondary | ICD-10-CM | POA: Diagnosis not present

## 2022-08-07 DIAGNOSIS — I10 Essential (primary) hypertension: Secondary | ICD-10-CM

## 2022-08-07 DIAGNOSIS — Z0000 Encounter for general adult medical examination without abnormal findings: Secondary | ICD-10-CM | POA: Diagnosis not present

## 2022-08-07 DIAGNOSIS — J4521 Mild intermittent asthma with (acute) exacerbation: Secondary | ICD-10-CM | POA: Diagnosis not present

## 2022-08-07 LAB — COMPREHENSIVE METABOLIC PANEL
ALT: 11 U/L (ref 0–35)
AST: 18 U/L (ref 0–37)
Albumin: 4 g/dL (ref 3.5–5.2)
Alkaline Phosphatase: 78 U/L (ref 39–117)
BUN: 14 mg/dL (ref 6–23)
CO2: 28 mEq/L (ref 19–32)
Calcium: 9.4 mg/dL (ref 8.4–10.5)
Chloride: 105 mEq/L (ref 96–112)
Creatinine, Ser: 1.24 mg/dL — ABNORMAL HIGH (ref 0.40–1.20)
GFR: 42.24 mL/min — ABNORMAL LOW (ref >60.00)
Glucose, Bld: 86 mg/dL (ref 70–99)
Potassium: 4.3 mEq/L (ref 3.5–5.1)
Sodium: 141 mEq/L (ref 135–145)
Total Bilirubin: 0.4 mg/dL (ref 0.2–1.2)
Total Protein: 7.3 g/dL (ref 6.0–8.3)

## 2022-08-07 LAB — CBC
HCT: 40.5 % (ref 36.0–46.0)
Hemoglobin: 12.6 g/dL (ref 12.0–15.0)
MCHC: 31 g/dL (ref 30.0–36.0)
MCV: 75.7 fl — ABNORMAL LOW (ref 78.0–100.0)
Platelets: 223 10*3/uL (ref 150.0–400.0)
RBC: 5.35 Mil/uL — ABNORMAL HIGH (ref 3.87–5.11)
RDW: 15.8 % — ABNORMAL HIGH (ref 11.5–15.5)
WBC: 5 10*3/uL (ref 4.0–10.5)

## 2022-08-07 LAB — LIPID PANEL
Cholesterol: 170 mg/dL (ref 0–200)
HDL: 54.7 mg/dL (ref >39.00)
LDL Cholesterol: 100 mg/dL — ABNORMAL HIGH (ref 0–99)
NonHDL: 115.31
Total CHOL/HDL Ratio: 3
Triglycerides: 78 mg/dL (ref 0.0–149.0)
VLDL: 15.6 mg/dL (ref 0.0–40.0)

## 2022-08-07 LAB — VITAMIN D 25 HYDROXY (VIT D DEFICIENCY, FRACTURES): VITD: 36.95 ng/mL (ref 30.00–100.00)

## 2022-08-07 LAB — VITAMIN B12: Vitamin B-12: 1140 pg/mL — ABNORMAL HIGH (ref 211–911)

## 2022-08-07 LAB — TSH: TSH: 3 u[IU]/mL (ref 0.35–5.50)

## 2022-08-07 MED ORDER — DICYCLOMINE HCL 20 MG PO TABS: ORAL_TABLET | ORAL | 0 refills | Status: DC

## 2022-08-07 MED ORDER — BUPROPION HCL ER (XL) 300 MG PO TB24: 300.0000 mg | ORAL_TABLET | Freq: Every day | ORAL | 3 refills | Status: DC

## 2022-08-07 MED ORDER — CLONAZEPAM 0.5 MG PO TABS: 0.5000 mg | ORAL_TABLET | Freq: Every day | ORAL | 3 refills | Status: DC | PRN

## 2022-08-07 MED ORDER — DOXYCYCLINE HYCLATE 100 MG PO TABS: 100.0000 mg | ORAL_TABLET | Freq: Two times a day (BID) | ORAL | 0 refills | Status: DC

## 2022-08-07 MED ORDER — FUROSEMIDE 40 MG PO TABS: 40.0000 mg | ORAL_TABLET | Freq: Every day | ORAL | 3 refills | Status: DC

## 2022-08-07 MED ORDER — LOSARTAN POTASSIUM 100 MG PO TABS: 100.0000 mg | ORAL_TABLET | Freq: Every day | ORAL | 3 refills | Status: DC

## 2022-08-07 MED ORDER — DILTIAZEM HCL ER BEADS 120 MG PO CP24: 120.0000 mg | ORAL_CAPSULE | Freq: Every day | ORAL | 3 refills | Status: DC

## 2022-08-07 MED ORDER — POTASSIUM CHLORIDE ER 8 MEQ PO TBCR: EXTENDED_RELEASE_TABLET | ORAL | 3 refills | Status: DC

## 2022-08-07 MED ORDER — OMEPRAZOLE 40 MG PO CPDR: 40.0000 mg | DELAYED_RELEASE_CAPSULE | Freq: Every day | ORAL | 3 refills | Status: DC

## 2022-08-07 MED ORDER — ESCITALOPRAM OXALATE 10 MG PO TABS: 20.0000 mg | ORAL_TABLET | Freq: Every day | ORAL | 3 refills | Status: DC

## 2022-08-07 NOTE — Patient Instructions (Signed)
We have sent in doxycycline to take 1 pill twice a day for 1 week.  

## 2022-08-07 NOTE — Progress Notes (Unsigned)
   Subjective:   Patient ID: Dominique Johnston, female    DOB: [birth year], 77 y.o.   MRN: [id number]  HPI The patient is here for physical.  PMH, [facility], social history reviewed and updated  Review of Systems  Objective:  Physical Exam  Vitals:   08/07/22 0958 08/07/22 1014  BP: (!) 150/78 (!) 150/78  Pulse: 65   Temp: 98.1 F (36.7 C)   TempSrc: Oral   SpO2: 95%   Weight: 193 lb (87.5 kg)   Height: 5\' 6"  (1.676 m)    EKG: Rate 63, axis normal, interval normal, sinus with rare PVC, no st or t wave changes, no significant change compared to prior 2016  Assessment & Plan:

## 2022-08-08 NOTE — Assessment & Plan Note (Signed)
With flare today with new rhonchi on exam. Rx doxycycline 1 week to clear. Keep taking wixela and albuterol prn. She is taking her allergy medication regularly.

## 2022-08-08 NOTE — Assessment & Plan Note (Signed)
Slightly worse this year and will increase wellbutrin to 300 mg daily and keep lexapro 20 mg daily. She will let us know in 3-4 weeks if this is helping her. She uses clonazepam 0.5 mg daily prn and wishes to keep that same for now.

## 2022-08-08 NOTE — Assessment & Plan Note (Signed)
Flu shot up to date. Covid-19 counseled. Pneumonia complete. Shingrix due for second to complete at pharmacy. Tetanus due 2030. Colonoscopy aged out. Mammogram aged out, pap smear aged out and dexa complete. Counseled about sun safety and mole surveillance. Counseled about the dangers of distracted driving. Given 10 year screening recommendations.

## 2022-08-08 NOTE — Assessment & Plan Note (Signed)
EKG done to update and is unchanged from prior. BP at goal on diltiazem 120 mg daily and lasix 40 mg daily and losartan 100 mg daily. Checking CMP and CBC and lipid panel and adjust as needed.

## 2022-08-08 NOTE — Assessment & Plan Note (Signed)
Checking lipid panel and adjust as needed. Not on statin currently.  

## 2022-09-17 DIAGNOSIS — H35373 Puckering of macula, bilateral: Secondary | ICD-10-CM | POA: Diagnosis not present

## 2022-09-17 DIAGNOSIS — H43813 Vitreous degeneration, bilateral: Secondary | ICD-10-CM | POA: Diagnosis not present

## 2022-09-17 DIAGNOSIS — H401132 Primary open-angle glaucoma, bilateral, moderate stage: Secondary | ICD-10-CM | POA: Diagnosis not present

## 2022-09-17 DIAGNOSIS — H35033 Hypertensive retinopathy, bilateral: Secondary | ICD-10-CM | POA: Diagnosis not present

## 2022-09-17 DIAGNOSIS — H35353 Cystoid macular degeneration, bilateral: Secondary | ICD-10-CM | POA: Diagnosis not present

## 2022-10-06 ENCOUNTER — Other Ambulatory Visit: Payer: Self-pay | Admitting: Internal Medicine

## 2022-10-19 ENCOUNTER — Other Ambulatory Visit: Payer: Self-pay | Admitting: Internal Medicine

## 2022-12-28 ENCOUNTER — Other Ambulatory Visit: Payer: Self-pay | Admitting: Internal Medicine

## 2023-02-05 ENCOUNTER — Other Ambulatory Visit: Payer: Self-pay | Admitting: Internal Medicine

## 2023-02-23 ENCOUNTER — Ambulatory Visit (INDEPENDENT_AMBULATORY_CARE_PROVIDER_SITE_OTHER): Payer: PPO

## 2023-02-23 ENCOUNTER — Ambulatory Visit (HOSPITAL_COMMUNITY)
Admission: EM | Admit: 2023-02-23 | Discharge: 2023-02-23 | Disposition: A | Discharge status: Home / Self Care | Payer: PPO | Attending: Emergency Medicine | Admitting: Emergency Medicine

## 2023-02-23 ENCOUNTER — Encounter (HOSPITAL_COMMUNITY): Payer: Self-pay

## 2023-02-23 DIAGNOSIS — J4521 Mild intermittent asthma with (acute) exacerbation: Secondary | ICD-10-CM | POA: Diagnosis not present

## 2023-02-23 DIAGNOSIS — R062 Wheezing: Secondary | ICD-10-CM | POA: Diagnosis not present

## 2023-02-23 DIAGNOSIS — R0602 Shortness of breath: Secondary | ICD-10-CM | POA: Diagnosis not present

## 2023-02-23 DIAGNOSIS — J069 Acute upper respiratory infection, unspecified: Secondary | ICD-10-CM

## 2023-02-23 DIAGNOSIS — R059 Cough, unspecified: Secondary | ICD-10-CM | POA: Diagnosis not present

## 2023-02-23 MED ORDER — PREDNISONE 10 MG (21) PO TBPK: ORAL_TABLET | Freq: Every day | ORAL | 0 refills | Status: DC

## 2023-02-23 MED ORDER — AMOXICILLIN-POT CLAVULANATE 875-125 MG PO TABS: 1.0000 | ORAL_TABLET | Freq: Two times a day (BID) | ORAL | 0 refills | Status: DC

## 2023-02-23 MED ORDER — AZITHROMYCIN 250 MG PO TABS: 250.0000 mg | ORAL_TABLET | Freq: Every day | ORAL | 0 refills | Status: DC

## 2023-02-23 NOTE — ED Provider Notes (Signed)
[facility]    CSN: [id number] Arrival date & time: 02/23/23  1258      History   Chief Complaint Chief Complaint  Patient presents with   Cough    HPI Dominique Johnston is a 77 y.o. female.   Patient presents to clinic for productive cough, wheezing, shortness of breath, nasal congestion and intermittent chills for the past month.  Wheezing at night and shortness of breath with coughing fits.  She does have a history of asthma, has not been using her albuterol inhaler.  Has been using a cough syrup and Mucinex with some relief of the congestion.  Reports hot and cold chills, subjective fever.  Has not measured temperature at home.  Denies dysuria.  No abdominal pain, nausea, vomiting.  No sore throat.  Describes sputum as 'yucky.'   Reports her blood pressure is elevated due to ongoing shoulder pain.   The history is provided by the patient and medical records.  Cough Associated symptoms: chills, shortness of breath and wheezing   Associated symptoms: no chest pain, no fever and no sore throat     Past Medical History:  Diagnosis Date   Allergy    rhinitis   Asthma    Bouchard nodes (DJD hand)    Bronchitis    recurrent   Cataract    Depression    DJD (degenerative joint disease) of knee    DJD (degenerative joint disease), lumbosacral    GERD (gastroesophageal reflux disease)    Headache(784.0)    Headache(784.0)    Hyperlipidemia    Hypertension    IBS (irritable bowel syndrome)    Sinusitis, chronic     Patient Active Problem List   Diagnosis Date Noted   Left hip pain 03/22/2019   Mild intermittent asthma 10/08/2016   Routine health maintenance 10/25/2012   Borderline high cholesterol 01/19/2010   G E REFLUX 12/10/2007   Generalized anxiety disorder 12/04/2007   Essential hypertension 12/01/2006   Allergic rhinitis 12/01/2006   IRRITABLE BOWEL SYNDROME 12/01/2006   Osteoarthritis 12/01/2006    Past Surgical History:   Procedure Laterality Date   BREAST EXCISIONAL BIOPSY Left 1998   CATARACT EXTRACTION Right    04/21/18   ORIF ANKLE FRACTURE  '07   Left ankle   SEPTOPLASTY  2000   TONSILLECTOMY     transnasal laryngoscopy  2005   TUBAL LIGATION  1977    OB History   No obstetric history on file.      Home Medications    Prior to Admission medications   Medication Sig Start Date End Date Taking? Authorizing Provider  amoxicillin-clavulanate (AUGMENTIN) 875-125 MG tablet Take 1 tablet by mouth every 12 (twelve) hours. 02/23/23  Yes Rinaldo Ratel, [place] N, FNP  azithromycin (ZITHROMAX) 250 MG tablet Take 1 tablet (250 mg total) by mouth daily. Take first 2 tablets together, then 1 every day until finished. 02/23/23  Yes Rinaldo Ratel, [place] N, FNP  predniSONE (STERAPRED UNI-PAK 21 TAB) 10 MG (21) TBPK tablet Take by mouth daily. Take as prescribed. 02/23/23  Yes Rinaldo Ratel, [place] N, FNP  albuterol (PROVENTIL HFA;VENTOLIN HFA) 108 (90 Base) MCG/ACT inhaler Inhale 2 puffs into the lungs every 6 (six) hours as needed. 03/28/16   [name], MD  Ascorbic Acid (VITAMIN C PO) Take 1 tablet by mouth daily.    [provider]  buPROPion (WELLBUTRIN XL) 300 MG 24 hr tablet Take 1 tablet (300 mg total) by mouth daily. 08/07/22   Hillard Danker  A, MD  clonazePAM (KLONOPIN) 0.5 MG tablet Take 1 tablet (0.5 mg total) by mouth daily as needed for anxiety. 08/07/22   [name], MD  Cyanocobalamin (B-12 PO) Take by mouth daily.    [provider]  diclofenac Sodium (VOLTAREN) 1 % GEL Apply 4 g topically 4 (four) times daily. 02/26/20   Georgetta Haber, NP  dicyclomine (BENTYL) 20 MG tablet TAKE 1 TABLET BY MOUTH FOUR TIMES DAILY BEFORE MEALS AND AT BEDTIME 02/06/23   [name], MD  diltiazem [facility]) 120 MG 24 hr capsule Take 1 capsule (120 mg total) by mouth daily. 08/07/22   [name], MD  dorzolamide (TRUSOPT) 2 % ophthalmic solution Place into both  eyes 2 (two) times daily.  01/13/14   [provider]  escitalopram (LEXAPRO) 10 MG tablet Take 2 tablets (20 mg total) by mouth daily. 08/07/22   [name], MD  fluticasone (FLONASE) 50 MCG/ACT nasal spray Place 1 spray into both nostrils daily. 07/22/20   Rhys Martini, PA-C  furosemide (LASIX) 40 MG tablet Take 1 tablet (40 mg total) by mouth daily. 08/07/22   [name], MD  gabapentin (NEURONTIN) 100 MG capsule Take 100 mg by mouth at bedtime. 10/19/19   Melina Fiddler, MD  ibuprofen (ADVIL) 800 MG tablet TAKE 1 TABLET(800 MG) BY MOUTH DAILY AS NEEDED FOR MODERATE PAIN 01/18/20   [name], MD  ipratropium (ATROVENT) 0.06 % nasal spray Place 2 sprays into both nostrils 4 (four) times daily. 05/17/18   Cathie Hoops, Amy V, PA-C  ketorolac (ACULAR) 0.4 % SOLN 1 drop 4 (four) times daily.    [provider]  latanoprost (XALATAN) 0.005 % ophthalmic solution Place 1 drop into both eyes daily. 10/22/12   Norins, Rosalyn Gess, MD  levocetirizine (XYZAL) 5 MG tablet Take 2.5 mg by mouth every evening. 02/23/18   [provider]  losartan (COZAAR) 100 MG tablet Take 1 tablet (100 mg total) by mouth daily. 08/07/22   [name], MD  multivitamin [facility]) tablet Take 1 tablet by mouth daily.    [provider]  omeprazole (PRILOSEC) 40 MG capsule TAKE 1 CAPSULE BY MOUTH EVERY DAY 10/08/22   [name], MD  potassium chloride (KLOR-CON) 8 MEQ tablet TAKE 1 TABLET(8 MEQ) BY MOUTH DAILY 08/07/22   [name], MD  prednisoLONE acetate (PRED FORTE) 1 % ophthalmic suspension Place 1 drop into the left eye 4 (four) times daily.    [provider]  triamcinolone cream (KENALOG) 0.1 % APPLY TOPICALLY TO THE AFFECTED AREA TWICE DAILY AS DIRECTED 10/21/22   [name], MD  Monte Fantasia INHUB 250-50 MCG/ACT AEPB 1 puff 2 (two) times daily.    [provider]  diltiazem (DILACOR XR) 120 MG 24 hr capsule Take 1  capsule (120 mg total) by mouth daily. 10/22/11 10/11/12  Norins, Rosalyn Gess, MD    Family History Family History  Problem Relation Age of Onset   Hypertension Father    Coronary artery disease Father        MI/ Fatal   Breast cancer Neg Hx    Diabetes Neg Hx    Colon cancer Neg Hx     Social History Social History   Tobacco Use   Smoking status: Never   Smokeless tobacco: Never  Vaping Use   Vaping status: Never Used  Substance Use Topics   Alcohol use: Never   Drug use: Not Currently  Allergies   Enalapril maleate and Sulfamethoxazole-trimethoprim   Review of Systems Review of Systems  Constitutional:  Positive for chills. Negative for fever.  HENT:  Positive for congestion. Negative for sore throat.   Respiratory:  Positive for cough, shortness of breath and wheezing.   Cardiovascular:  Negative for chest pain.  Gastrointestinal:  Negative for abdominal pain, diarrhea, nausea and vomiting.  Genitourinary:  Negative for dysuria.     Physical Exam Triage Vital Signs ED Triage Vitals  Encounter Vitals Group     BP 02/23/23 1350 (!) 172/90     Systolic BP Percentile --      Diastolic BP Percentile --      Pulse Rate 02/23/23 1350 73     Resp 02/23/23 1350 16     Temp 02/23/23 1350 98.2 F (36.8 C)     Temp Source 02/23/23 1350 Oral     SpO2 02/23/23 1350 94 %     Weight 02/23/23 1350 184 lb (83.5 kg)     Height 02/23/23 1350 5\' 6"  (1.676 m)     Head Circumference --      Peak Flow --      Pain Score 02/23/23 1348 3     Pain Loc --      Pain Education --      Exclude from Growth Chart --    No data found.  Updated Vital Signs BP (!) 172/90 (BP Location: Right Arm)   Pulse 73   Temp 98.2 F (36.8 C) (Oral)   Resp 16   Ht 5\' 6"  (1.676 m)   Wt 184 lb (83.5 kg)   SpO2 94%   BMI 29.70 kg/m   Visual Acuity Right Eye Distance:   Left Eye Distance:   Bilateral Distance:    Right Eye Near:   Left Eye Near:    Bilateral Near:     Physical  Exam Vitals and nursing note reviewed.  Constitutional:      Appearance: Normal appearance.  HENT:     Head: Normocephalic and atraumatic.     Right Ear: External ear normal.     Left Ear: External ear normal.     Nose: Congestion present.     Mouth/Throat:     Mouth: Mucous membranes are moist.     Pharynx: No posterior oropharyngeal erythema.  Eyes:     Conjunctiva/sclera: Conjunctivae normal.  Cardiovascular:     Rate and Rhythm: Normal rate and regular rhythm.     Heart sounds: Normal heart sounds. No murmur heard. Pulmonary:     Effort: Pulmonary effort is normal.     Breath sounds: Wheezing and rhonchi present.     Comments: Diffuse wheezing and rhonchi, right side slightly more diminished in airway movement. Skin:    General: Skin is warm and dry.  Neurological:     General: No focal deficit present.     Mental Status: She is alert and oriented to person, place, and time.  Psychiatric:        Mood and Affect: Mood normal.        Behavior: Behavior normal.      UC Treatments / Results  Labs (all labs ordered are listed, but only abnormal results are displayed) Labs Reviewed - No data to display  EKG   Radiology DG Chest 2 View  Result Date: 02/23/2023 CLINICAL DATA:  Cough, wheezing, shortness of breath. EXAM: CHEST - 2 VIEW COMPARISON:  Chest radiograph dated 08/22/2021. FINDINGS: The heart size and mediastinal  contours are within normal limits. Vascular calcifications are seen in the aortic arch. Both lungs are clear. Degenerative changes are seen in the spine. IMPRESSION: No active cardiopulmonary disease. Electronically Signed   By: Romona Curls M.D.   On: 02/23/2023 15:12    Procedures Procedures (including critical care time)  Medications Ordered in UC Medications - No data to display  Initial Impression / Assessment and Plan / UC Course  I have reviewed the triage vital signs and the nursing notes.  Pertinent labs & imaging results that were  available during my care of the patient were reviewed by me and considered in my medical decision making (see chart for details).  Vitals and triage reviewed, patient is hemodynamically stable.  Lungs with expiratory wheezing, able to speak in full sentences, oxygenation 94% on room air.  History of asthma, symptoms have been ongoing for a month.  Chest x-ray suspicious for infiltrate, awaiting official radiology overread.  Due to duration, age, comorbidities and presentation will cover for bacterial URI with Augmentin plus azithromycin.  Send in steroid burst for asthma exacerbation.  Plan of care, follow-up care return precautions given, no questions at this time.     Final Clinical Impressions(s) / UC Diagnoses   Final diagnoses:  Upper respiratory tract infection, unspecified type  Mild intermittent asthma with acute exacerbation     Discharge Instructions      Take all antibiotics as prescribed and until finished.  Take them with food to help prevent gastrointestinal upset.  Use your albuterol inhaler every 4-6 hours for wheezing or shortness of breath.  Start the steroid taper back today, take them the next few days in the morning with breakfast.  Your symptoms should improve over the next few days, if no improvement please return to clinic.  Seek immediate care for changes in symptoms, worsening of shortness of breath, no improvement in wheezing, fever, fatigue, or any new symptoms.      ED Prescriptions     Medication Sig Dispense Auth. Provider   predniSONE (STERAPRED UNI-PAK 21 TAB) 10 MG (21) TBPK tablet Take by mouth daily. Take as prescribed. 21 tablet Rinaldo Ratel, [place] N, [place]   amoxicillin-clavulanate (AUGMENTIN) 875-125 MG tablet Take 1 tablet by mouth every 12 (twelve) hours. 14 tablet Rinaldo Ratel, [place] N, [place]   azithromycin (ZITHROMAX) 250 MG tablet Take 1 tablet (250 mg total) by mouth daily. Take first 2 tablets together, then 1 every day until finished. 6 tablet  Ruhi Kopke, [place] N, [place]      PDMP not reviewed this encounter.   Hunter Bachar, [place] N, [place] 02/23/23 [phone]

## 2023-02-23 NOTE — Discharge Instructions (Addendum)
Take all antibiotics as prescribed and until finished.  Take them with food to help prevent gastrointestinal upset.  Use your albuterol inhaler every 4-6 hours for wheezing or shortness of breath.  Start the steroid taper back today, take them the next few days in the morning with breakfast.  Your symptoms should improve over the next few days, if no improvement please return to clinic.  Seek immediate care for changes in symptoms, worsening of shortness of breath, no improvement in wheezing, fever, fatigue, or any new symptoms.

## 2023-02-23 NOTE — ED Triage Notes (Signed)
Patient here today with c/o cough, wheeze, SOB, congestion, and some chills off and on X 1 month. She has been taking Mucinex with some relief. She has a h/o asthma. No sick contacts. No recent travel.

## 2023-03-19 ENCOUNTER — Other Ambulatory Visit: Payer: Self-pay | Admitting: Internal Medicine

## 2023-03-19 DIAGNOSIS — Z1231 Encounter for screening mammogram for malignant neoplasm of breast: Secondary | ICD-10-CM

## 2023-03-25 DIAGNOSIS — H401132 Primary open-angle glaucoma, bilateral, moderate stage: Secondary | ICD-10-CM | POA: Diagnosis not present

## 2023-03-25 DIAGNOSIS — H35373 Puckering of macula, bilateral: Secondary | ICD-10-CM | POA: Diagnosis not present

## 2023-03-25 DIAGNOSIS — H35033 Hypertensive retinopathy, bilateral: Secondary | ICD-10-CM | POA: Diagnosis not present

## 2023-03-25 DIAGNOSIS — H43813 Vitreous degeneration, bilateral: Secondary | ICD-10-CM | POA: Diagnosis not present

## 2023-03-25 DIAGNOSIS — H35353 Cystoid macular degeneration, bilateral: Secondary | ICD-10-CM | POA: Diagnosis not present

## 2023-04-03 DIAGNOSIS — H26491 Other secondary cataract, right eye: Secondary | ICD-10-CM | POA: Diagnosis not present

## 2023-04-17 ENCOUNTER — Telehealth: Payer: PPO | Admitting: Physician Assistant

## 2023-04-17 DIAGNOSIS — B9689 Other specified bacterial agents as the cause of diseases classified elsewhere: Secondary | ICD-10-CM | POA: Diagnosis not present

## 2023-04-17 DIAGNOSIS — J069 Acute upper respiratory infection, unspecified: Secondary | ICD-10-CM | POA: Diagnosis not present

## 2023-04-17 DIAGNOSIS — J4531 Mild persistent asthma with (acute) exacerbation: Secondary | ICD-10-CM | POA: Diagnosis not present

## 2023-04-17 MED ORDER — BENZONATATE 100 MG PO CAPS: 100.0000 mg | ORAL_CAPSULE | Freq: Three times a day (TID) | ORAL | 0 refills | Status: DC | PRN

## 2023-04-17 MED ORDER — DOXYCYCLINE HYCLATE 100 MG PO TABS: 100.0000 mg | ORAL_TABLET | Freq: Two times a day (BID) | ORAL | 0 refills | Status: DC

## 2023-04-17 MED ORDER — PREDNISONE 20 MG PO TABS: 40.0000 mg | ORAL_TABLET | Freq: Every day | ORAL | 0 refills | Status: DC

## 2023-04-17 NOTE — Patient Instructions (Signed)
Dominique Johnston, thank you for joining [name], PA-C for today's virtual visit.  While this provider is not your primary care provider (PCP), if your PCP is located in our provider database this encounter information will be shared with them immediately following your visit.   A [facility] MyChart account gives you access to today's visit and all your visits, tests, and labs performed at [facility] " click here if you don't have a [facility] MyChart account or go to [email or web address]  Consent: (Patient) Dominique Johnston provided verbal consent for this virtual visit at the beginning of the encounter.  Current Medications:  Current Outpatient Medications:    benzonatate (TESSALON) 100 MG capsule, Take 1-2 capsules (100-200 mg total) by mouth 3 (three) times daily as needed., Disp: 30 capsule, Rfl: 0   doxycycline (VIBRA-TABS) 100 MG tablet, Take 1 tablet (100 mg total) by mouth 2 (two) times daily., Disp: 20 tablet, Rfl: 0   predniSONE (DELTASONE) 20 MG tablet, Take 2 tablets (40 mg total) by mouth daily with breakfast., Disp: 10 tablet, Rfl: 0   albuterol (PROVENTIL HFA;VENTOLIN HFA) 108 (90 Base) MCG/ACT inhaler, Inhale 2 puffs into the lungs every 6 (six) hours as needed., Disp: 18 g, Rfl: 1   Ascorbic Acid (VITAMIN C PO), Take 1 tablet by mouth daily., Disp: , Rfl:    buPROPion (WELLBUTRIN XL) 300 MG 24 hr tablet, Take 1 tablet (300 mg total) by mouth daily., Disp: 90 tablet, Rfl: 3   clonazePAM (KLONOPIN) 0.5 MG tablet, Take 1 tablet (0.5 mg total) by mouth daily as needed for anxiety., Disp: 30 tablet, Rfl: 3   Cyanocobalamin (B-12 PO), Take by mouth daily., Disp: , Rfl:    diclofenac Sodium (VOLTAREN) 1 % GEL, Apply 4 g topically 4 (four) times daily., Disp: 350 g, Rfl: 0   dicyclomine (BENTYL) 20 MG tablet, TAKE 1 TABLET BY MOUTH FOUR TIMES DAILY BEFORE MEALS AND AT BEDTIME, Disp: 90 tablet, Rfl: 3   diltiazem (TIAZAC) 120 MG 24 hr  capsule, Take 1 capsule (120 mg total) by mouth daily., Disp: 90 capsule, Rfl: 3   dorzolamide (TRUSOPT) 2 % ophthalmic solution, Place into both eyes 2 (two) times daily. , Disp: , Rfl:    escitalopram (LEXAPRO) 10 MG tablet, Take 2 tablets (20 mg total) by mouth daily., Disp: 180 tablet, Rfl: 3   fluticasone (FLONASE) 50 MCG/ACT nasal spray, Place 1 spray into both nostrils daily., Disp: 15 mL, Rfl: 2   furosemide (LASIX) 40 MG tablet, Take 1 tablet (40 mg total) by mouth daily., Disp: 90 tablet, Rfl: 3   gabapentin (NEURONTIN) 100 MG capsule, Take 100 mg by mouth at bedtime., Disp: , Rfl:    ibuprofen (ADVIL) 800 MG tablet, TAKE 1 TABLET(800 MG) BY MOUTH DAILY AS NEEDED FOR MODERATE PAIN, Disp: 90 tablet, Rfl: 1   ipratropium (ATROVENT) 0.06 % nasal spray, Place 2 sprays into both nostrils 4 (four) times daily., Disp: 15 mL, Rfl: 0   ketorolac (ACULAR) 0.4 % SOLN, 1 drop 4 (four) times daily., Disp: , Rfl:    latanoprost (XALATAN) 0.005 % ophthalmic solution, Place 1 drop into both eyes daily., Disp: 2.5 mL, Rfl: 11   levocetirizine (XYZAL) 5 MG tablet, Take 2.5 mg by mouth every evening., Disp: , Rfl: 6   losartan (COZAAR) 100 MG tablet, Take 1 tablet (100 mg total) by mouth daily., Disp: 90 tablet, Rfl: 3   multivitamin (THERAGRAN) tablet, Take 1 tablet by mouth daily., Disp: ,  Rfl:    omeprazole (PRILOSEC) 40 MG capsule, TAKE 1 CAPSULE BY MOUTH EVERY DAY, Disp: 90 capsule, Rfl: 3   potassium chloride (KLOR-CON) 8 MEQ tablet, TAKE 1 TABLET(8 MEQ) BY MOUTH DAILY, Disp: 90 tablet, Rfl: 3   prednisoLONE acetate (PRED FORTE) 1 % ophthalmic suspension, Place 1 drop into the left eye 4 (four) times daily., Disp: , Rfl:    triamcinolone cream (KENALOG) 0.1 %, APPLY TOPICALLY TO THE AFFECTED AREA TWICE DAILY AS DIRECTED, Disp: 80 g, Rfl: 0   WIXELA INHUB 250-50 MCG/ACT AEPB, 1 puff 2 (two) times daily., Disp: , Rfl:    Medications ordered in this encounter:  Meds ordered this encounter  Medications    doxycycline (VIBRA-TABS) 100 MG tablet    Sig: Take 1 tablet (100 mg total) by mouth 2 (two) times daily.    Dispense:  20 tablet    Refill:  0    Order Specific Question:   Supervising Provider    Answer:   Merrilee Jansky [[id number]]   predniSONE (DELTASONE) 20 MG tablet    Sig: Take 2 tablets (40 mg total) by mouth daily with breakfast.    Dispense:  10 tablet    Refill:  0    Order Specific Question:   Supervising Provider    Answer:   Merrilee Jansky [[id number]]   benzonatate (TESSALON) 100 MG capsule    Sig: Take 1-2 capsules (100-200 mg total) by mouth 3 (three) times daily as needed.    Dispense:  30 capsule    Refill:  0    Order Specific Question:   Supervising Provider    Answer:   Merrilee Jansky [id number]     *If you need refills on other medications prior to your next appointment, please contact your pharmacy*  Follow-Up: Call back or seek an in-person evaluation if the symptoms worsen or if the condition fails to improve as anticipated.  [facility] [phone]  Other Instructions Upper Respiratory Infection, Adult An upper respiratory infection (URI) is a common viral infection of the nose, throat, and upper air passages that lead to the lungs. The most common type of URI is the common cold. URIs usually get better on their own, without medical treatment. What are the causes? A URI is caused by a virus. You may catch a virus by: Breathing in droplets from an infected person's cough or sneeze. Touching something that has been exposed to the virus (is contaminated) and then touching your mouth, nose, or eyes. What increases the risk? You are more likely to get a URI if: You are very young or very old. You have close contact with others, such as at work, school, or a health care facility. You smoke. You have long-term (chronic) heart or lung disease. You have a weakened disease-fighting system (immune system). You have nasal allergies or  asthma. You are experiencing a lot of stress. You have poor nutrition. What are the signs or symptoms? A URI usually involves some of the following symptoms: Runny or stuffy (congested) nose. Cough. Sneezing. Sore throat. Headache. Fatigue. Fever. Loss of appetite. Pain in your forehead, behind your eyes, and over your cheekbones (sinus pain). Muscle aches. Redness or irritation of the eyes. Pressure in the ears or face. How is this diagnosed? This condition may be diagnosed based on your medical history and symptoms, and a physical exam. Your health care provider may use a swab to take a mucus sample from your nose (nasal  swab). This sample can be tested to determine what virus is causing the illness. How is this treated? URIs usually get better on their own within 7-10 days. Medicines cannot cure URIs, but your health care provider may recommend certain medicines to help relieve symptoms, such as: Over-the-counter cold medicines. Cough suppressants. Coughing is a type of defense against infection that helps to clear the respiratory system, so take these medicines only as recommended by your health care provider. Fever-reducing medicines. Follow these instructions at home: Activity Rest as needed. If you have a fever, stay home from work or school until your fever is gone or until your health care provider says your URI cannot spread to other people (is no longer contagious). Your health care provider may have you wear a face mask to prevent your infection from spreading. Relieving symptoms Gargle with a mixture of salt and water 3-4 times a day or as needed. To make salt water, completely dissolve -1 tsp (3-6 g) of salt in 1 cup (237 mL) of warm water. Use a cool-mist humidifier to add moisture to the air. This can help you breathe more easily. Eating and drinking  Drink enough fluid to keep your urine pale yellow. Eat soups and other clear broths. General instructions  Take  over-the-counter and prescription medicines only as told by your health care provider. These include cold medicines, fever reducers, and cough suppressants. Do not use any products that contain nicotine or tobacco. These products include cigarettes, chewing tobacco, and vaping devices, such as e-cigarettes. If you need help quitting, ask your health care provider. Stay away from secondhand smoke. Stay up to date on all immunizations, including the yearly (annual) flu vaccine. Keep all follow-up visits. This is important. How to prevent the spread of infection to others URIs can be contagious. To prevent the infection from spreading: Wash your hands with soap and water for at least 20 seconds. If soap and water are not available, use hand sanitizer. Avoid touching your mouth, face, eyes, or nose. Cough or sneeze into a tissue or your sleeve or elbow instead of into your hand or into the air.  Contact a health care provider if: You are getting worse instead of better. You have a fever or chills. Your mucus is brown or red. You have yellow or brown discharge coming from your nose. You have pain in your face, especially when you bend forward. You have swollen neck glands. You have pain while swallowing. You have white areas in the back of your throat. Get help right away if: You have shortness of breath that gets worse. You have severe or persistent: Headache. Ear pain. Sinus pain. Chest pain. You have chronic lung disease along with any of the following: Making high-pitched whistling sounds when you breathe, most often when you breathe out (wheezing). Prolonged cough (more than 14 days). Coughing up blood. A change in your usual mucus. You have a stiff neck. You have changes in your: Vision. Hearing. Thinking. Mood. These symptoms may be an emergency. Get help right away. Call 911. Do not wait to see if the symptoms will go away. Do not drive yourself to the  hospital. Summary An upper respiratory infection (URI) is a common infection of the nose, throat, and upper air passages that lead to the lungs. A URI is caused by a virus. URIs usually get better on their own within 7-10 days. Medicines cannot cure URIs, but your health care provider may recommend certain medicines to help relieve symptoms.  This information is not intended to replace advice given to you by your health care provider. Make sure you discuss any questions you have with your health care provider. Document Revised: 11/29/2020 Document Reviewed: 11/29/2020 Elsevier Patient Education  2024 Elsevier Inc.    If you have been instructed to have an in-person evaluation today at a local Urgent Care facility, please use the link below. It will take you to a list of all of our available [facility], including address, phone number and hours of operation. Please do not delay care.  [facility]  If you or a family member do not have a primary care provider, use the link below to schedule a visit and establish care. When you choose a [facility] primary care physician or advanced practice provider, you gain a long-term partner in health. Find a Primary Care Provider  Learn more about [facility]'s in-office and virtual care options: [facility] - Get Care Now

## 2023-04-17 NOTE — Progress Notes (Signed)
Virtual Visit Consent   Dominique Johnston, you are scheduled for a virtual visit with a [facility] provider today. Just as with appointments in the office, your consent must be obtained to participate. Your consent will be active for this visit and any virtual visit you may have with one of our providers in the next 365 days. If you have a MyChart account, a copy of this consent can be sent to you electronically.  As this is a virtual visit, video technology does not allow for your provider to perform a traditional examination. This may limit your provider's ability to fully assess your condition. If your provider identifies any concerns that need to be evaluated in person or the need to arrange testing (such as labs, EKG, etc.), we will make arrangements to do so. Although advances in technology are sophisticated, we cannot ensure that it will always work on either your end or our end. If the connection with a video visit is poor, the visit may have to be switched to a telephone visit. With either a video or telephone visit, we are not always able to ensure that we have a secure connection.  By engaging in this virtual visit, you consent to the provision of healthcare and authorize for your insurance to be billed (if applicable) for the services provided during this visit. Depending on your insurance coverage, you may receive a charge related to this service.  I need to obtain your verbal consent now. Are you willing to proceed with your visit today? [name] has provided verbal consent on 04/17/2023 for a virtual visit (video or telephone). [name], PA-C  Date: 04/17/2023 12:19 PM  Virtual Visit via Video Note   I, [name], connected with  Dominique Johnston  ([id number], 1946/03/25) on 04/17/23 at 12:15 PM EST by a video-enabled telemedicine application and verified that I am speaking with the correct person using two  identifiers.  Location: Patient: Virtual Visit Location Patient: Home Provider: Virtual Visit Location Provider: Home Office   I discussed the limitations of evaluation and management by telemedicine and the availability of in person appointments. The patient expressed understanding and agreed to proceed.    History of Present Illness: Dominique Johnston is a 77 y.o. who identifies as a female who was assigned female at birth, and is being seen today for URI symptoms.  HPI: URI  This is a new problem. The current episode started 1 to 4 weeks ago (about a week). The problem has been gradually worsening. Maximum temperature: reports low grade fever just today. The fever has been present for Less than 1 day. Associated symptoms include chest pain (tightness with cough), congestion, coughing, headaches, a plugged ear sensation, rhinorrhea, sinus pain (frontal), a sore throat (scratchy) and wheezing. Pertinent negatives include no diarrhea, ear pain, nausea or vomiting. Associated symptoms comments: Some fatigue. She has tried increased fluids and acetaminophen (mucinex) for the symptoms. The treatment provided no relief.     Problems:  Patient Active Problem List   Diagnosis Date Noted   Left hip pain 03/22/2019   Mild intermittent asthma 10/08/2016   Routine health maintenance 10/25/2012   Borderline high cholesterol 01/19/2010   G E REFLUX 12/10/2007   Generalized anxiety disorder 12/04/2007   Essential hypertension 12/01/2006   Allergic rhinitis 12/01/2006   IRRITABLE BOWEL SYNDROME 12/01/2006   Osteoarthritis 12/01/2006    Allergies:  Allergies  Allergen Reactions   Enalapril Maleate     REACTION: swelling,  headache   Sulfamethoxazole-Trimethoprim     Abdominal pain, redness eyes   Medications:  Current Outpatient Medications:    benzonatate (TESSALON) 100 MG capsule, Take 1-2 capsules (100-200 mg total) by mouth 3 (three) times daily as needed., Disp: 30 capsule, Rfl: 0    doxycycline (VIBRA-TABS) 100 MG tablet, Take 1 tablet (100 mg total) by mouth 2 (two) times daily., Disp: 20 tablet, Rfl: 0   predniSONE (DELTASONE) 20 MG tablet, Take 2 tablets (40 mg total) by mouth daily with breakfast., Disp: 10 tablet, Rfl: 0   albuterol (PROVENTIL HFA;VENTOLIN HFA) 108 (90 Base) MCG/ACT inhaler, Inhale 2 puffs into the lungs every 6 (six) hours as needed., Disp: 18 g, Rfl: 1   Ascorbic Acid (VITAMIN C PO), Take 1 tablet by mouth daily., Disp: , Rfl:    buPROPion (WELLBUTRIN XL) 300 MG 24 hr tablet, Take 1 tablet (300 mg total) by mouth daily., Disp: 90 tablet, Rfl: 3   clonazePAM (KLONOPIN) 0.5 MG tablet, Take 1 tablet (0.5 mg total) by mouth daily as needed for anxiety., Disp: 30 tablet, Rfl: 3   Cyanocobalamin (B-12 PO), Take by mouth daily., Disp: , Rfl:    diclofenac Sodium (VOLTAREN) 1 % GEL, Apply 4 g topically 4 (four) times daily., Disp: 350 g, Rfl: 0   dicyclomine (BENTYL) 20 MG tablet, TAKE 1 TABLET BY MOUTH FOUR TIMES DAILY BEFORE MEALS AND AT BEDTIME, Disp: 90 tablet, Rfl: 3   diltiazem (TIAZAC) 120 MG 24 hr capsule, Take 1 capsule (120 mg total) by mouth daily., Disp: 90 capsule, Rfl: 3   dorzolamide (TRUSOPT) 2 % ophthalmic solution, Place into both eyes 2 (two) times daily. , Disp: , Rfl:    escitalopram (LEXAPRO) 10 MG tablet, Take 2 tablets (20 mg total) by mouth daily., Disp: 180 tablet, Rfl: 3   fluticasone (FLONASE) 50 MCG/ACT nasal spray, Place 1 spray into both nostrils daily., Disp: 15 mL, Rfl: 2   furosemide (LASIX) 40 MG tablet, Take 1 tablet (40 mg total) by mouth daily., Disp: 90 tablet, Rfl: 3   gabapentin (NEURONTIN) 100 MG capsule, Take 100 mg by mouth at bedtime., Disp: , Rfl:    ibuprofen (ADVIL) 800 MG tablet, TAKE 1 TABLET(800 MG) BY MOUTH DAILY AS NEEDED FOR MODERATE PAIN, Disp: 90 tablet, Rfl: 1   ipratropium (ATROVENT) 0.06 % nasal spray, Place 2 sprays into both nostrils 4 (four) times daily., Disp: 15 mL, Rfl: 0   ketorolac (ACULAR) 0.4  % SOLN, 1 drop 4 (four) times daily., Disp: , Rfl:    latanoprost (XALATAN) 0.005 % ophthalmic solution, Place 1 drop into both eyes daily., Disp: 2.5 mL, Rfl: 11   levocetirizine (XYZAL) 5 MG tablet, Take 2.5 mg by mouth every evening., Disp: , Rfl: 6   losartan (COZAAR) 100 MG tablet, Take 1 tablet (100 mg total) by mouth daily., Disp: 90 tablet, Rfl: 3   multivitamin (THERAGRAN) tablet, Take 1 tablet by mouth daily., Disp: , Rfl:    omeprazole (PRILOSEC) 40 MG capsule, TAKE 1 CAPSULE BY MOUTH EVERY DAY, Disp: 90 capsule, Rfl: 3   potassium chloride (KLOR-CON) 8 MEQ tablet, TAKE 1 TABLET(8 MEQ) BY MOUTH DAILY, Disp: 90 tablet, Rfl: 3   prednisoLONE acetate (PRED FORTE) 1 % ophthalmic suspension, Place 1 drop into the left eye 4 (four) times daily., Disp: , Rfl:    triamcinolone cream (KENALOG) 0.1 %, APPLY TOPICALLY TO THE AFFECTED AREA TWICE DAILY AS DIRECTED, Disp: 80 g, Rfl: 0   WIXELA INHUB  250-50 MCG/ACT AEPB, 1 puff 2 (two) times daily., Disp: , Rfl:   Observations/Objective: Patient is well-developed, well-nourished in no acute distress.  Resting comfortably at home.  Head is normocephalic, atraumatic.  No labored breathing.  Speech is clear and coherent with logical content.  Patient is alert and oriented at baseline.    Assessment and Plan: 1. Bacterial upper respiratory infection - doxycycline (VIBRA-TABS) 100 MG tablet; Take 1 tablet (100 mg total) by mouth 2 (two) times daily.  Dispense: 20 tablet; Refill: 0 - predniSONE (DELTASONE) 20 MG tablet; Take 2 tablets (40 mg total) by mouth daily with breakfast.  Dispense: 10 tablet; Refill: 0 - benzonatate (TESSALON) 100 MG capsule; Take 1-2 capsules (100-200 mg total) by mouth 3 (three) times daily as needed.  Dispense: 30 capsule; Refill: 0  2. Mild persistent asthma with acute exacerbation - predniSONE (DELTASONE) 20 MG tablet; Take 2 tablets (40 mg total) by mouth daily with breakfast.  Dispense: 10 tablet; Refill: 0 -  benzonatate (TESSALON) 100 MG capsule; Take 1-2 capsules (100-200 mg total) by mouth 3 (three) times daily as needed.  Dispense: 30 capsule; Refill: 0  - Worsening over a week despite OTC medications - Will treat with Doxycycline, Prednisone and tessalon perles - Can continue Mucinex  - Use Albuterol inhaler as prescribed - Push fluids.  - Rest.  - Steam and humidifier can help - Seek in person evaluation if worsening or symptoms fail to improve    Follow Up Instructions: I discussed the assessment and treatment plan with the patient. The patient was provided an opportunity to ask questions and all were answered. The patient agreed with the plan and demonstrated an understanding of the instructions.  A copy of instructions were sent to the patient via MyChart unless otherwise noted below.    The patient was advised to call back or seek an in-person evaluation if the symptoms worsen or if the condition fails to improve as anticipated.    [name], PA-C

## 2023-04-19 ENCOUNTER — Other Ambulatory Visit: Payer: Self-pay | Admitting: Internal Medicine

## 2023-04-22 ENCOUNTER — Ambulatory Visit
Admission: RE | Admit: 2023-04-22 | Discharge: 2023-04-22 | Disposition: A | Discharge status: Home / Self Care | Payer: PPO | Source: Ambulatory Visit | Attending: Internal Medicine | Admitting: Internal Medicine

## 2023-04-22 DIAGNOSIS — Z1231 Encounter for screening mammogram for malignant neoplasm of breast: Secondary | ICD-10-CM

## 2023-04-25 ENCOUNTER — Encounter: Payer: Self-pay | Admitting: Internal Medicine

## 2023-04-25 LAB — HM MAMMOGRAPHY

## 2023-05-27 ENCOUNTER — Ambulatory Visit (HOSPITAL_COMMUNITY)
Admission: EM | Admit: 2023-05-27 | Discharge: 2023-05-27 | Disposition: A | Discharge status: Home / Self Care | Payer: PPO

## 2023-05-27 ENCOUNTER — Encounter (HOSPITAL_COMMUNITY): Payer: Self-pay | Admitting: Emergency Medicine

## 2023-05-27 DIAGNOSIS — S0990XA Unspecified injury of head, initial encounter: Secondary | ICD-10-CM

## 2023-05-27 DIAGNOSIS — S0083XA Contusion of other part of head, initial encounter: Secondary | ICD-10-CM | POA: Diagnosis not present

## 2023-05-27 NOTE — ED Triage Notes (Signed)
 Pt reports around 3pm was in kitchen and went to answer phone that was ringing, foot twisted and hit her head on kitchen sink/cabinet around it. Denies LOC or blood thinners.

## 2023-05-27 NOTE — Discharge Instructions (Addendum)
Go to the Emergency department for evaluation

## 2023-05-27 NOTE — ED Provider Notes (Signed)
 [facility]    CSN: [id number] Arrival date & time: 05/27/23  1627      History   Chief Complaint Chief Complaint  Patient presents with   Fall   Head Injury    HPI Dominique Johnston is a 78 y.o. female.   Patient reports she fell at home today and hit the right side of her face and forehead on a cabinet/counter.  Patient reports that the impact broke her glasses.  Patient complains of a sore area above her right eye and to the side of her right eye.  Patient states she has not had any loss of vision.  She did not lose consciousness. Pt is concerned that she may have a fracture.  Pt worried that she could have a hemmorhage from hitting her head   The history is provided by the patient. No language interpreter was used.  Fall This is a new problem. The problem occurs constantly. The problem has not changed since onset.Nothing aggravates the symptoms. Nothing relieves the symptoms. She has tried nothing for the symptoms.  Head Injury   Past Medical History:  Diagnosis Date   Allergy    rhinitis   Asthma    Bouchard nodes (DJD hand)    Bronchitis    recurrent   Cataract    Depression    DJD (degenerative joint disease) of knee    DJD (degenerative joint disease), lumbosacral    GERD (gastroesophageal reflux disease)    Headache(784.0)    Headache(784.0)    Hyperlipidemia    Hypertension    IBS (irritable bowel syndrome)    Sinusitis, chronic     Patient Active Problem List   Diagnosis Date Noted   Left hip pain 03/22/2019   Mild intermittent asthma 10/08/2016   Routine health maintenance 10/25/2012   Borderline high cholesterol 01/19/2010   G E REFLUX 12/10/2007   Generalized anxiety disorder 12/04/2007   Essential hypertension 12/01/2006   Allergic rhinitis 12/01/2006   IRRITABLE BOWEL SYNDROME 12/01/2006   Osteoarthritis 12/01/2006    Past Surgical History:  Procedure Laterality Date   BREAST EXCISIONAL BIOPSY Left 1998   CATARACT  EXTRACTION Right    04/21/18   ORIF ANKLE FRACTURE  '07   Left ankle   SEPTOPLASTY  2000   TONSILLECTOMY     transnasal laryngoscopy  2005   TUBAL LIGATION  1977    OB History   No obstetric history on file.      Home Medications    Prior to Admission medications   Medication Sig Start Date End Date Taking? Authorizing Provider  albuterol  (PROVENTIL  HFA;VENTOLIN  HFA) 108 (90 Base) MCG/ACT inhaler Inhale 2 puffs into the lungs every 6 (six) hours as needed. 03/28/16   Rollene Almarie LABOR, MD  Ascorbic Acid (VITAMIN C PO) Take 1 tablet by mouth daily.    [provider]  benzonatate  (TESSALON ) 100 MG capsule Take 1-2 capsules (100-200 mg total) by mouth 3 (three) times daily as needed. 04/17/23   Vivienne Delon HERO, PA-C  buPROPion  (WELLBUTRIN  XL) 300 MG 24 hr tablet Take 1 tablet (300 mg total) by mouth daily. 08/07/22   Rollene Almarie LABOR, MD  clonazePAM  (KLONOPIN ) 0.5 MG tablet TAKE 1 TABLET(0.5 MG) BY MOUTH DAILY AS NEEDED FOR ANXIETY 05/05/23   Norleen Lynwood ORN, MD  Cyanocobalamin  (B-12 PO) Take by mouth daily.    [provider]  diclofenac  Sodium (VOLTAREN ) 1 % GEL Apply 4 g topically 4 (four) times daily. 02/26/20  Burky, Natalie B, NP  dicyclomine  (BENTYL ) 20 MG tablet TAKE 1 TABLET BY MOUTH FOUR TIMES DAILY BEFORE MEALS AND AT BEDTIME 02/06/23   Rollene Almarie LABOR, MD  diltiazem  (TIAZAC ) 120 MG 24 hr capsule Take 1 capsule (120 mg total) by mouth daily. 08/07/22   Rollene Almarie LABOR, MD  dorzolamide (TRUSOPT) 2 % ophthalmic solution Place into both eyes 2 (two) times daily.  01/13/14   [provider]  doxycycline  (VIBRA -TABS) 100 MG tablet Take 1 tablet (100 mg total) by mouth 2 (two) times daily. 04/17/23   Vivienne Delon HERO, PA-C  escitalopram  (LEXAPRO ) 10 MG tablet Take 2 tablets (20 mg total) by mouth daily. 08/07/22   Rollene Almarie LABOR, MD  fluticasone  (FLONASE ) 50 MCG/ACT nasal spray Place 1 spray into both nostrils daily. 07/22/20    Graham, Laura E, PA-C  furosemide  (LASIX ) 40 MG tablet Take 1 tablet (40 mg total) by mouth daily. 08/07/22   Rollene Almarie LABOR, MD  gabapentin (NEURONTIN) 100 MG capsule Take 100 mg by mouth at bedtime. 10/19/19   Orpha Asberry RAMAN, MD  ibuprofen  (ADVIL ) 800 MG tablet TAKE 1 TABLET(800 MG) BY MOUTH DAILY AS NEEDED FOR MODERATE PAIN 01/18/20   Rollene Almarie LABOR, MD  ipratropium (ATROVENT ) 0.06 % nasal spray Place 2 sprays into both nostrils 4 (four) times daily. 05/17/18   Babara, Amy V, PA-C  ketorolac (ACULAR) 0.4 % SOLN 1 drop 4 (four) times daily.    [provider]  latanoprost  (XALATAN ) 0.005 % ophthalmic solution Place 1 drop into both eyes daily. 10/22/12   Norins, Michael E, MD  levocetirizine (XYZAL) 5 MG tablet Take 2.5 mg by mouth every evening. 02/23/18   [provider]  losartan  (COZAAR ) 100 MG tablet Take 1 tablet (100 mg total) by mouth daily. 08/07/22   Rollene Almarie LABOR, MD  multivitamin [facility]) tablet Take 1 tablet by mouth daily.    [provider]  omeprazole  (PRILOSEC) 40 MG capsule TAKE 1 CAPSULE BY MOUTH EVERY DAY 10/08/22   Rollene Almarie LABOR, MD  potassium chloride  (KLOR-CON ) 8 MEQ tablet TAKE 1 TABLET(8 MEQ) BY MOUTH DAILY 08/07/22   Rollene Almarie LABOR, MD  prednisoLONE acetate (PRED FORTE) 1 % ophthalmic suspension Place 1 drop into the left eye 4 (four) times daily.    [provider]  predniSONE  (DELTASONE ) 20 MG tablet Take 2 tablets (40 mg total) by mouth daily with breakfast. 04/17/23   Vivienne Delon HERO, PA-C  triamcinolone  cream (KENALOG ) 0.1 % APPLY TOPICALLY TO THE AFFECTED AREA TWICE DAILY AS DIRECTED 10/21/22   Rollene Almarie LABOR, MD  NAPOLEON INHUB 250-50 MCG/ACT AEPB 1 puff 2 (two) times daily.    [provider]  diltiazem  (DILACOR XR ) 120 MG 24 hr capsule Take 1 capsule (120 mg total) by mouth daily. 10/22/11 10/11/12  Norins, Ozell BRAVO, MD    Family History Family History  Problem Relation Age of  Onset   Hypertension Father    Coronary artery disease Father        MI/ Fatal   Breast cancer Neg Hx    Diabetes Neg Hx    Colon cancer Neg Hx     Social History Social History   Tobacco Use   Smoking status: Never   Smokeless tobacco: Never  Vaping Use   Vaping status: Never Used  Substance Use Topics   Alcohol use: Never   Drug use: Not Currently     Allergies   Enalapril maleate and Sulfamethoxazole-trimethoprim  Review of Systems Review of Systems  All other systems reviewed and are negative.    Physical Exam Triage Vital Signs ED Triage Vitals  Encounter Vitals Group     BP 05/27/23 1751 (!) 180/91     Systolic BP Percentile --      Diastolic BP Percentile --      Pulse Rate 05/27/23 1751 66     Resp 05/27/23 1751 18     Temp 05/27/23 1751 98.1 F (36.7 C)     Temp Source 05/27/23 1751 Oral     SpO2 05/27/23 1751 98 %     Weight --      Height --      Head Circumference --      Peak Flow --      Pain Score 05/27/23 1748 2     Pain Loc --      Pain Education --      Exclude from Growth Chart --    No data found.  Updated Vital Signs BP (!) 180/91 (BP Location: Left Arm)   Pulse 66   Temp 98.1 F (36.7 C) (Oral)   Resp 18   SpO2 98%   Visual Acuity Right Eye Distance:   Left Eye Distance:   Bilateral Distance:    Right Eye Near:   Left Eye Near:    Bilateral Near:     Physical Exam Vitals and nursing note reviewed.  Constitutional:      Appearance: She is well-developed.  HENT:     Head: Normocephalic.     Comments: Tender right eyebrow and right temporal area, no bruising     Nose: Nose normal.     Mouth/Throat:     Mouth: Mucous membranes are dry.  Eyes:     Extraocular Movements: Extraocular movements intact.     Pupils: Pupils are equal, round, and reactive to light.  Cardiovascular:     Rate and Rhythm: Normal rate.  Pulmonary:     Effort: Pulmonary effort is normal.  Abdominal:     General: There is no distension.   Musculoskeletal:        General: Normal range of motion.     Cervical back: Normal range of motion.  Skin:    General: Skin is warm.  Neurological:     General: No focal deficit present.     Mental Status: She is alert and oriented to person, place, and time.  Psychiatric:        Mood and Affect: Mood normal.      UC Treatments / Results  Labs (all labs ordered are listed, but only abnormal results are displayed) Labs Reviewed - No data to display  EKG   Radiology No results found.  Procedures Procedures (including critical care time)  Medications Ordered in UC Medications - No data to display  Initial Impression / Assessment and Plan / UC Course  I have reviewed the triage vital signs and the nursing notes.  Pertinent labs & imaging results that were available during my care of the patient were reviewed by me and considered in my medical decision making (see chart for details).    I advised pt I do not have ability to do a ct from urgent care.  Pt advised to go to ED  Final Clinical Impressions(s) / UC Diagnoses   Final diagnoses:  Contusion of face, initial encounter  Closed head injury, initial encounter   Discharge Instructions   None    ED Prescriptions  None    PDMP not reviewed this encounter. An After Visit Summary was printed and given to the patient.       Flint Sonny POUR, PA-C 05/27/23 8161

## 2023-05-27 NOTE — ED Notes (Signed)
 Patient is being discharged from the Urgent Care and sent to the Emergency Department via POV . Per Darice, [place], patient is in need of higher level of care due to Fall. Patient is aware and verbalizes understanding of plan of care.  Vitals:   05/27/23 1751  BP: (!) 180/91  Pulse: 66  Resp: 18  Temp: 98.1 F (36.7 C)  SpO2: 98%

## 2023-05-28 ENCOUNTER — Ambulatory Visit: Payer: Self-pay | Admitting: Internal Medicine

## 2023-05-28 ENCOUNTER — Telehealth: Payer: Self-pay

## 2023-05-28 NOTE — Telephone Encounter (Signed)
 Copied from CRM [id number]. Topic: Clinical - Medical Advice >> May 28, 2023 10:20 AM Lizabeth Riggs wrote: Reason for CRM: She fell yesterday in her kitchen and hit her head on the edge of sink. She did not pass out. She went to urgent care and they checked her out and advised her to go to ED for a head scan. Urgent care called ED and there was 44 people waiting to be seen. She decided to go home and call Dr. Constance Dellen office this morning. She did good last. She wants Dr. Nicolette Barrio to order a CT scan of head to make sure she is okay. No appointments are available today. She made an appointment for this Friday. Please give Dominique Johnston a call today. Thanks

## 2023-05-28 NOTE — Telephone Encounter (Signed)
  Chief Complaint: Fall Symptoms: Tenderness, pain to right side of face Frequency: Ongoing since yesterday Pertinent Negatives: Patient denies N/V, dizziness Disposition: [x] ED /[] Urgent Care (no appt availability in office) / [] Appointment(In office/virtual)/ []  [facility]/ [] Home Care/ [] Refused Recommended Disposition /[] [facility]/ []  Follow-up with PCP Additional Notes: Pt reports she had a fall yesterday when turning to leave her kitchen, she tripped on her feet, slipped and hit the right side of her face on the sink. Pt denies N/V, vision changes. Pt reports she was seen UC yesterday following the fall and was advised to go to ED for imaging. Pt reports she did not stay as the wait was very long. Pt advised to proceed to ED for imaging and eval. Pt agreeable. Notes she has an appt scheduled for Friday, will leave this appt for follow up and pt will call back if appt needs to be changed.   Copied from CRM [id number]. Topic: Clinical - Medical Advice >> May 28, 2023  4:24 PM Erling Hawthorne B wrote: Reason for CRM: Reason for CRM: She fell yesterday in her kitchen and hit her head on the edge of sink. She did not pass out. She went to urgent care and they checked her out and advised her to go to ED for a head scan. Urgent care called ED and there was 44 people waiting to be seen. She decided to go home and call Dr. Constance Dellen office this morning. She did good last. She wants Dr. Nicolette Barrio to order a CT scan of head to make sure she is okay. Please give pt a call today. Thanks Reason for Disposition  Injury (or injuries) that need emergency care  Answer Assessment - Initial Assessment Questions 1. MECHANISM: "How did the fall happen?"     Turned to leave kitchen and lost balance and fell into sink and hit head   3. ONSET: "When did the fall happen?" (e.g., minutes, hours, or days ago)     Yesterday 4. LOCATION: "What part of the body hit the ground?" (e.g., back, buttocks,  head, hips, knees, hands, head, stomach)     Side of right face hit sink 5. INJURY: "Did you hurt (injure) yourself when you fell?" If Yes, ask: "What did you injure? Tell me more about this?" (e.g., body area; type of injury; pain severity)"     Hit side of face on sink 6. PAIN: "Is there any pain?" If Yes, ask: "How bad is the pain?" (e.g., Scale 1-10; or mild,  moderate, severe)   - NONE (0): No pain   - MILD (1-3): Doesn't interfere with normal activities    - MODERATE (4-7): Interferes with normal activities or awakens from sleep    - SEVERE (8-10): Excruciating pain, unable to do any normal activities      Moderate, taking Tylenol  9. OTHER SYMPTOMS: "Do you have any other symptoms?" (e.g., dizziness, fever, weakness; new onset or worsening).      Stiff neck, HA 10. CAUSE: "What do you think caused the fall (or falling)?" (e.g., tripped, dizzy spell)       Tripped  Protocols used: Falls and [facility], [place]

## 2023-05-29 ENCOUNTER — Emergency Department (HOSPITAL_COMMUNITY): Payer: PPO

## 2023-05-29 ENCOUNTER — Emergency Department (HOSPITAL_COMMUNITY)
Admission: EM | Admit: 2023-05-29 | Discharge: 2023-05-29 | Disposition: A | Discharge status: Home / Self Care | Payer: PPO | Attending: Emergency Medicine | Admitting: Emergency Medicine

## 2023-05-29 ENCOUNTER — Other Ambulatory Visit: Payer: Self-pay

## 2023-05-29 DIAGNOSIS — I10 Essential (primary) hypertension: Secondary | ICD-10-CM | POA: Diagnosis not present

## 2023-05-29 DIAGNOSIS — W1839XA Other fall on same level, initial encounter: Secondary | ICD-10-CM | POA: Diagnosis not present

## 2023-05-29 DIAGNOSIS — M47811 Spondylosis without myelopathy or radiculopathy, occipito-atlanto-axial region: Secondary | ICD-10-CM | POA: Diagnosis not present

## 2023-05-29 DIAGNOSIS — S0083XA Contusion of other part of head, initial encounter: Secondary | ICD-10-CM | POA: Diagnosis not present

## 2023-05-29 DIAGNOSIS — M47812 Spondylosis without myelopathy or radiculopathy, cervical region: Secondary | ICD-10-CM | POA: Diagnosis not present

## 2023-05-29 DIAGNOSIS — S199XXA Unspecified injury of neck, initial encounter: Secondary | ICD-10-CM | POA: Diagnosis not present

## 2023-05-29 DIAGNOSIS — E119 Type 2 diabetes mellitus without complications: Secondary | ICD-10-CM | POA: Diagnosis not present

## 2023-05-29 DIAGNOSIS — S0990XA Unspecified injury of head, initial encounter: Secondary | ICD-10-CM | POA: Diagnosis not present

## 2023-05-29 DIAGNOSIS — W19XXXA Unspecified fall, initial encounter: Secondary | ICD-10-CM

## 2023-05-29 NOTE — Telephone Encounter (Signed)
I have called and informed patient to please urgently go to urgent care and do not wait until Friday

## 2023-05-29 NOTE — Telephone Encounter (Signed)
Needs to go to ER should not wait until Friday if there is urgent need.

## 2023-05-29 NOTE — ED Triage Notes (Signed)
Pt. Stated, I fell 2 days ago tripped and turned too fast. I hit the rt. Upper side of my face above eyebrow.

## 2023-05-29 NOTE — ED Provider Notes (Signed)
[place] EMERGENCY DEPARTMENT AT [facility] Provider Note   CSN: [id number] Arrival date & time: 05/29/23  1006     History  Chief Complaint  Patient presents with   Fall   Head Injury    Dominique Johnston is a 78 y.o. female.  Patient is a 79 year old female with a history of hypertension, asthma, hyperlipidemia, IBS and GERD who is presenting today for evaluation after she had a fall.  The fall happened on Tuesday when she turned too quickly and her feet got tangled and she fell sideways hitting the right side of her face on the sink.  She did fall to the floor but denies any loss of consciousness.  She had a mild headache and some neck stiffness the day it happened.  She did go to urgent care and was evaluated but was unable to get a scan.  Because of the long wait she went home and sat up all night and did not develop any worsening headache or vomiting.  She tried to call her doctor yesterday to get a scan and they were unable to schedule that.  Because she never had it evaluated she came today for evaluation.  She denies any visual changes, new headache, nausea or vomiting.  She has been getting around the same as she normally does.  She denies any anticoagulation.  The history is provided by the patient.  Fall  Head Injury      Home Medications Prior to Admission medications   Medication Sig Start Date End Date Taking? Authorizing Provider  albuterol (PROVENTIL HFA;VENTOLIN HFA) 108 (90 Base) MCG/ACT inhaler Inhale 2 puffs into the lungs every 6 (six) hours as needed. 03/28/16   [name], MD  Ascorbic Acid (VITAMIN C PO) Take 1 tablet by mouth daily.    [provider]  buPROPion (WELLBUTRIN XL) 300 MG 24 hr tablet Take 1 tablet (300 mg total) by mouth daily. 08/07/22   [name], MD  Cyanocobalamin (B-12 PO) Take by mouth daily.    [provider]  diclofenac Sodium (VOLTAREN) 1 % GEL Apply 4 g topically 4 (four)  times daily. 02/26/20   Georgetta Haber, NP  dicyclomine (BENTYL) 20 MG tablet TAKE 1 TABLET BY MOUTH FOUR TIMES DAILY BEFORE MEALS AND AT BEDTIME 02/06/23   [name], MD  diltiazem [facility]) 120 MG 24 hr capsule Take 1 capsule (120 mg total) by mouth daily. 08/07/22   [name], MD  dorzolamide (TRUSOPT) 2 % ophthalmic solution Place into both eyes 2 (two) times daily.  01/13/14   [provider]  escitalopram (LEXAPRO) 10 MG tablet Take 2 tablets (20 mg total) by mouth daily. 08/07/22   [name], MD  fluticasone (FLONASE) 50 MCG/ACT nasal spray Place 1 spray into both nostrils daily. 07/22/20   Rhys Martini, PA-C  furosemide (LASIX) 40 MG tablet Take 1 tablet (40 mg total) by mouth daily. 08/07/22   [name], MD  gabapentin (NEURONTIN) 100 MG capsule Take 100 mg by mouth at bedtime. 10/19/19   Melina Fiddler, MD  ibuprofen (ADVIL) 800 MG tablet TAKE 1 TABLET(800 MG) BY MOUTH DAILY AS NEEDED FOR MODERATE PAIN 01/18/20   [name], MD  ketorolac (ACULAR) 0.4 % SOLN 1 drop 4 (four) times daily.    [provider]  latanoprost (XALATAN) 0.005 % ophthalmic solution Place 1 drop into both eyes daily. 10/22/12   Norins, Rosalyn Gess, MD  levocetirizine (XYZAL) 5 MG tablet Take 2.5 mg by mouth every evening. 02/23/18   [provider]  losartan (COZAAR) 100 MG tablet Take 1 tablet (100 mg total) by mouth daily. 08/07/22   [name], MD  multivitamin [facility]) tablet Take 1 tablet by mouth daily.    [provider]  omeprazole (PRILOSEC) 40 MG capsule TAKE 1 CAPSULE BY MOUTH EVERY DAY 10/08/22   [name], MD  potassium chloride (KLOR-CON) 8 MEQ tablet TAKE 1 TABLET(8 MEQ) BY MOUTH DAILY 08/07/22   [name], MD  prednisoLONE acetate (PRED FORTE) 1 % ophthalmic suspension Place 1 drop into the left eye 4 (four) times daily.    [provider]  Monte Fantasia INHUB 250-50 MCG/ACT  AEPB 1 puff 2 (two) times daily.    [provider]  diltiazem (DILACOR XR) 120 MG 24 hr capsule Take 1 capsule (120 mg total) by mouth daily. 10/22/11 10/11/12  Norins, Rosalyn Gess, MD      Allergies    Enalapril maleate and Sulfamethoxazole-trimethoprim    Review of Systems   Review of Systems  Physical Exam Updated Vital Signs BP (!) 161/88 (BP Location: Left Arm)   Pulse 70   Temp 98.3 F (36.8 C)   Resp 16   Ht 5\' 6"  (1.676 m)   Wt 85.7 kg   SpO2 100%   BMI 30.51 kg/m  Physical Exam Vitals and nursing note reviewed.  Constitutional:      General: She is not in acute distress.    Appearance: She is well-developed.  HENT:     Head: Normocephalic and atraumatic.   Eyes:     Extraocular Movements: Extraocular movements intact.     Conjunctiva/sclera: Conjunctivae normal.     Pupils: Pupils are equal, round, and reactive to light.  Neck:   Cardiovascular:     Rate and Rhythm: Normal rate.     Pulses: Normal pulses.  Pulmonary:     Effort: Pulmonary effort is normal. No respiratory distress.  Musculoskeletal:        General: No tenderness. Normal range of motion.     Comments: No edema  Skin:    General: Skin is warm and dry.     Findings: No rash.  Neurological:     Mental Status: She is alert and oriented to person, place, and time.     Cranial Nerves: No cranial nerve deficit.     Sensory: No sensory deficit.     Motor: No weakness.     Gait: Gait normal.  Psychiatric:        Mood and Affect: Mood normal.        Behavior: Behavior normal.     ED Results / Procedures / Treatments   Labs (all labs ordered are listed, but only abnormal results are displayed) Labs Reviewed - No data to display  EKG None  Radiology CT Head Wo Contrast Result Date: 05/29/2023 CLINICAL DATA:  Provided history: Head trauma, minor. Facial trauma, blunt. Neck trauma. EXAM: CT HEAD WITHOUT CONTRAST CT MAXILLOFACIAL WITHOUT CONTRAST CT CERVICAL SPINE WITHOUT CONTRAST  TECHNIQUE: Multidetector CT imaging of the head, cervical spine, and maxillofacial structures were performed using the standard protocol without intravenous contrast. Multiplanar CT image reconstructions of the cervical spine and maxillofacial structures were also generated. RADIATION DOSE REDUCTION: This exam was performed according to the departmental dose-optimization program which includes automated exposure control, adjustment of the mA and/or kV according to patient size and/or use of iterative reconstruction  technique. COMPARISON:  None. FINDINGS: CT HEAD FINDINGS Brain: Mild generalized cerebral atrophy. There is no acute intracranial hemorrhage. No demarcated cortical infarct. No extra-axial fluid collection. No evidence of an intracranial mass. No midline shift. Vascular: No hyperdense vessel.  Atherosclerotic calcifications. Skull: No calvarial fracture or aggressive osseous lesion. CT MAXILLOFACIAL FINDINGS Osseous: No acute maxillofacial fracture is identified. Orbits: No acute orbital finding. Sinuses: 18 mm mucous retention cysts, and mild background mucosal thickening, within the right maxillary sinus. Soft tissues: No maxillofacial hematoma is appreciable by CT. Other: Bilateral TMJ osteoarthrosis. CT CERVICAL SPINE FINDINGS Alignment: Nonspecific straightening of the expected cervical lordosis. No significant spondylolisthesis. Skull base and vertebrae: The basion-dental and atlanto-dental intervals are maintained.No evidence of acute fracture to the cervical spine. Soft tissues and spinal canal: No prevertebral fluid or swelling. No visible canal hematoma. Disc levels: Cervical spondylosis with multilevel disc space narrowing, disc bulges/central disc protrusions, endplate spurring, uncovertebral hypertrophy and facet arthrosis. Disc space narrowing is greatest at C4-C5, C5-C6 and C6-C7 (moderate to advanced at these levels). No appreciable high-grade spinal canal stenosis. Multilevel bony neural  foraminal narrowing. Degenerative changes also present at the C1-C2 articulation. Multilevel ventral osteophytes. Upper chest: No consolidation within the imaged lung apices. No visible pneumothorax. IMPRESSION: CT head: 1. No evidence of an acute intracranial abnormality. 2. Mild generalized cerebral atrophy. CT maxillofacial: 1. No evidence of an acute maxillofacial fracture. 2. Right maxillary sinus disease as described. CT Cervical spine: 1. No evidence of an acute cervical spine fracture. 2. Nonspecific straightening of the expected cervical lordosis. 3. Cervical spondylosis as described. Electronically Signed   By: Jackey Loge D.O.   On: 05/29/2023 12:15   CT Cervical Spine Wo Contrast Result Date: 05/29/2023 CLINICAL DATA:  Provided history: Head trauma, minor. Facial trauma, blunt. Neck trauma. EXAM: CT HEAD WITHOUT CONTRAST CT MAXILLOFACIAL WITHOUT CONTRAST CT CERVICAL SPINE WITHOUT CONTRAST TECHNIQUE: Multidetector CT imaging of the head, cervical spine, and maxillofacial structures were performed using the standard protocol without intravenous contrast. Multiplanar CT image reconstructions of the cervical spine and maxillofacial structures were also generated. RADIATION DOSE REDUCTION: This exam was performed according to the departmental dose-optimization program which includes automated exposure control, adjustment of the mA and/or kV according to patient size and/or use of iterative reconstruction technique. COMPARISON:  None. FINDINGS: CT HEAD FINDINGS Brain: Mild generalized cerebral atrophy. There is no acute intracranial hemorrhage. No demarcated cortical infarct. No extra-axial fluid collection. No evidence of an intracranial mass. No midline shift. Vascular: No hyperdense vessel.  Atherosclerotic calcifications. Skull: No calvarial fracture or aggressive osseous lesion. CT MAXILLOFACIAL FINDINGS Osseous: No acute maxillofacial fracture is identified. Orbits: No acute orbital finding. Sinuses:  18 mm mucous retention cysts, and mild background mucosal thickening, within the right maxillary sinus. Soft tissues: No maxillofacial hematoma is appreciable by CT. Other: Bilateral TMJ osteoarthrosis. CT CERVICAL SPINE FINDINGS Alignment: Nonspecific straightening of the expected cervical lordosis. No significant spondylolisthesis. Skull base and vertebrae: The basion-dental and atlanto-dental intervals are maintained.No evidence of acute fracture to the cervical spine. Soft tissues and spinal canal: No prevertebral fluid or swelling. No visible canal hematoma. Disc levels: Cervical spondylosis with multilevel disc space narrowing, disc bulges/central disc protrusions, endplate spurring, uncovertebral hypertrophy and facet arthrosis. Disc space narrowing is greatest at C4-C5, C5-C6 and C6-C7 (moderate to advanced at these levels). No appreciable high-grade spinal canal stenosis. Multilevel bony neural foraminal narrowing. Degenerative changes also present at the C1-C2 articulation. Multilevel ventral osteophytes. Upper chest: No consolidation within the imaged  lung apices. No visible pneumothorax. IMPRESSION: CT head: 1. No evidence of an acute intracranial abnormality. 2. Mild generalized cerebral atrophy. CT maxillofacial: 1. No evidence of an acute maxillofacial fracture. 2. Right maxillary sinus disease as described. CT Cervical spine: 1. No evidence of an acute cervical spine fracture. 2. Nonspecific straightening of the expected cervical lordosis. 3. Cervical spondylosis as described. Electronically Signed   By: Jackey Loge D.O.   On: 05/29/2023 12:15   CT Maxillofacial Wo Contrast Result Date: 05/29/2023 CLINICAL DATA:  Provided history: Head trauma, minor. Facial trauma, blunt. Neck trauma. EXAM: CT HEAD WITHOUT CONTRAST CT MAXILLOFACIAL WITHOUT CONTRAST CT CERVICAL SPINE WITHOUT CONTRAST TECHNIQUE: Multidetector CT imaging of the head, cervical spine, and maxillofacial structures were performed using  the standard protocol without intravenous contrast. Multiplanar CT image reconstructions of the cervical spine and maxillofacial structures were also generated. RADIATION DOSE REDUCTION: This exam was performed according to the departmental dose-optimization program which includes automated exposure control, adjustment of the mA and/or kV according to patient size and/or use of iterative reconstruction technique. COMPARISON:  None. FINDINGS: CT HEAD FINDINGS Brain: Mild generalized cerebral atrophy. There is no acute intracranial hemorrhage. No demarcated cortical infarct. No extra-axial fluid collection. No evidence of an intracranial mass. No midline shift. Vascular: No hyperdense vessel.  Atherosclerotic calcifications. Skull: No calvarial fracture or aggressive osseous lesion. CT MAXILLOFACIAL FINDINGS Osseous: No acute maxillofacial fracture is identified. Orbits: No acute orbital finding. Sinuses: 18 mm mucous retention cysts, and mild background mucosal thickening, within the right maxillary sinus. Soft tissues: No maxillofacial hematoma is appreciable by CT. Other: Bilateral TMJ osteoarthrosis. CT CERVICAL SPINE FINDINGS Alignment: Nonspecific straightening of the expected cervical lordosis. No significant spondylolisthesis. Skull base and vertebrae: The basion-dental and atlanto-dental intervals are maintained.No evidence of acute fracture to the cervical spine. Soft tissues and spinal canal: No prevertebral fluid or swelling. No visible canal hematoma. Disc levels: Cervical spondylosis with multilevel disc space narrowing, disc bulges/central disc protrusions, endplate spurring, uncovertebral hypertrophy and facet arthrosis. Disc space narrowing is greatest at C4-C5, C5-C6 and C6-C7 (moderate to advanced at these levels). No appreciable high-grade spinal canal stenosis. Multilevel bony neural foraminal narrowing. Degenerative changes also present at the C1-C2 articulation. Multilevel ventral osteophytes.  Upper chest: No consolidation within the imaged lung apices. No visible pneumothorax. IMPRESSION: CT head: 1. No evidence of an acute intracranial abnormality. 2. Mild generalized cerebral atrophy. CT maxillofacial: 1. No evidence of an acute maxillofacial fracture. 2. Right maxillary sinus disease as described. CT Cervical spine: 1. No evidence of an acute cervical spine fracture. 2. Nonspecific straightening of the expected cervical lordosis. 3. Cervical spondylosis as described. Electronically Signed   By: Jackey Loge D.O.   On: 05/29/2023 12:15    Procedures Procedures    Medications Ordered in ED Medications - No data to display  ED Course/ Medical Decision Making/ A&P                                 Medical Decision Making Amount and/or Complexity of Data Reviewed Radiology: ordered and independent interpretation performed. Decision-making details documented in ED Course.   Patient presenting with history of multiple medical problems with a complaint today with increased morbidity and mortality.  Patient here today with a fall and head injury.  This was 2 days ago.  No new developing symptoms.  No anticoagulation.  Ecchymosis noted to the right eye area but vision is  intact and neurologically intact on exam.  Will evaluate due to age and injury.  Head, face and neck imaging are pending. 12:41 PM I have independently visualized and interpreted pt's images today.  Imaging of the head face and neck are negative for acute fracture or bleed today.  Radiology reports no evidence of acute abnormalities other than right maxillary sinus disease.        Final Clinical Impression(s) / ED Diagnoses Final diagnoses:  Fall, initial encounter  Facial contusion, initial encounter    Rx / DC Orders ED Discharge Orders     None         Gwyneth Sprout, MD 05/29/23 1241

## 2023-05-29 NOTE — Discharge Instructions (Addendum)
No signs of broken bones or any internal bleeding today.  You can continue with your normal routine.  If you start having more frequent falls, vision changes, severe headache return to the emergency room

## 2023-05-30 ENCOUNTER — Ambulatory Visit: Payer: PPO | Admitting: Family Medicine

## 2023-06-04 ENCOUNTER — Telehealth: Payer: PPO | Admitting: Physician Assistant

## 2023-06-04 ENCOUNTER — Telehealth: Payer: Self-pay

## 2023-06-04 DIAGNOSIS — R051 Acute cough: Secondary | ICD-10-CM

## 2023-06-04 DIAGNOSIS — J019 Acute sinusitis, unspecified: Secondary | ICD-10-CM | POA: Diagnosis not present

## 2023-06-04 MED ORDER — AMOXICILLIN-POT CLAVULANATE 875-125 MG PO TABS: 1.0000 | ORAL_TABLET | Freq: Two times a day (BID) | ORAL | 0 refills | Status: AC

## 2023-06-04 MED ORDER — BENZONATATE 200 MG PO CAPS: 200.0000 mg | ORAL_CAPSULE | Freq: Three times a day (TID) | ORAL | 0 refills | Status: DC | PRN

## 2023-06-04 NOTE — Progress Notes (Signed)
E-Visit for Sinus Problems  We are sorry that you are not feeling well.  Here is how we plan to help!  Based on what you have shared with me it looks like you have sinusitis.  Sinusitis is inflammation and infection in the sinus cavities of the head.  Based on your presentation I believe you most likely have Acute Bacterial Sinusitis.  This is an infection caused by bacteria and is treated with antibiotics. I have prescribed Augmentin 875mg /125mg  one tablet twice daily with food, for 7 days. I've also prescribed Tessalon 200mg  taken three times a day for cough. Use inhaler especially at bedtime which will help with coughing fits. Start a humidifier as well. Please stay well hydrated. Also take over the counter oral antihistamine as directed on the box.You may use an oral decongestant such as Mucinex D or if you have glaucoma or high blood pressure use plain Mucinex. Saline nasal spray help and can safely be used as often as needed for congestion.  If you develop worsening sinus pain, fever or notice severe headache and vision changes, or if symptoms are not better after completion of antibiotic, please schedule an appointment with a health care provider.    Sinus infections are not as easily transmitted as other respiratory infection, however we still recommend that you avoid close contact with loved ones, especially the very young and elderly.  Remember to wash your hands thoroughly throughout the day as this is the number one way to prevent the spread of infection!  Home Care: Only take medications as instructed by your medical team. Complete the entire course of an antibiotic. Do not take these medications with alcohol. A steam or ultrasonic humidifier can help congestion.  You can place a towel over your head and breathe in the steam from hot water coming from a faucet. Avoid close contacts especially the very young and the elderly. Cover your mouth when you cough or sneeze. Always remember to  wash your hands.  Get Help Right Away If: You develop worsening fever or sinus pain. You develop a severe head ache or visual changes. Your symptoms persist after you have completed your treatment plan.  Make sure you Understand these instructions. Will watch your condition. Will get help right away if you are not doing well or get worse.  Thank you for choosing an e-visit.  Your e-visit answers were reviewed by a board certified advanced clinical practitioner to complete your personal care plan. Depending upon the condition, your plan could have included both over the counter or prescription medications.  Please review your pharmacy choice. Make sure the pharmacy is open so you can pick up prescription now. If there is a problem, you may contact your provider through [employer] and have the prescription routed to another pharmacy.  Your safety is important to [place]. If you have drug allergies check your prescription carefully.   For the next 24 hours you can use MyChart to ask questions about today's visit, request a non-urgent call back, or ask for a work or school excuse. You will get an email in the next two days asking about your experience. I hope that your e-visit has been valuable and will speed your recovery.   I have spent 5 minutes in review of e-visit questionnaire, review and updating patient chart, medical decision making and response to patient.   Gilberto Better, PA-C

## 2023-06-04 NOTE — Progress Notes (Signed)
Transition Care Management Follow-up Telephone Call Date of discharge and from where: 05/29/2023 [facility] How have you been since you were released from the hospital? Patient stated her head injury is much better. She has had a low grade fever for the last two days and feels she may have bronchitis. She plans on using her MyChart for a Virtual Urgent Care visit. Any questions or concerns? No  Items Reviewed: Did the pt receive and understand the discharge instructions provided? Yes  Medications obtained and verified?  No medication prescribed Other? No  Any new allergies since your discharge? No  Dietary orders reviewed? Yes Do you have support at home? Yes   Follow up appointments reviewed:  PCP Hospital f/u appt confirmed? Yes  Scheduled to see wellness visit on 210/2025 @ [facility]. Specialist Hospital f/u appt confirmed? No  Scheduled to see  on  @ . Are transportation arrangements needed? No  If their condition worsens, is the pt aware to call PCP or go to the Emergency Dept.? Yes Was the patient provided with contact information for the PCP's office or ED? Yes Was to pt encouraged to call back with questions or concerns? Yes   Jahaad Penado [facility]  [facility] Guide Direct Dial: [phone]  Fax: [phone] Website: [email or web address]

## 2023-06-05 ENCOUNTER — Telehealth: Payer: PPO | Admitting: Physician Assistant

## 2023-06-05 DIAGNOSIS — J208 Acute bronchitis due to other specified organisms: Secondary | ICD-10-CM | POA: Diagnosis not present

## 2023-06-05 DIAGNOSIS — B9689 Other specified bacterial agents as the cause of diseases classified elsewhere: Secondary | ICD-10-CM

## 2023-06-05 NOTE — Patient Instructions (Signed)
Dominique Johnston, thank you for joining Piedad Climes, PA-C for today's virtual visit.  While this provider is not your primary care provider (PCP), if your PCP is located in our provider database this encounter information will be shared with them immediately following your visit.   A [facility] MyChart account gives you access to today's visit and all your visits, tests, and labs performed at [facility] " click here if you don't have a [facility] MyChart account or go to [email or web address]  Consent: (Patient) Dominique Johnston provided verbal consent for this virtual visit at the beginning of the encounter.  Current Medications:  Current Outpatient Medications:    albuterol (PROVENTIL HFA;VENTOLIN HFA) 108 (90 Base) MCG/ACT inhaler, Inhale 2 puffs into the lungs every 6 (six) hours as needed., Disp: 18 g, Rfl: 1   amoxicillin-clavulanate (AUGMENTIN) 875-125 MG tablet, Take 1 tablet by mouth 2 (two) times daily for 5 days., Disp: 10 tablet, Rfl: 0   Ascorbic Acid (VITAMIN C PO), Take 1 tablet by mouth daily., Disp: , Rfl:    benzonatate (TESSALON) 200 MG capsule, Take 1 capsule (200 mg total) by mouth 3 (three) times daily as needed for cough., Disp: 30 capsule, Rfl: 0   buPROPion (WELLBUTRIN XL) 300 MG 24 hr tablet, Take 1 tablet (300 mg total) by mouth daily., Disp: 90 tablet, Rfl: 3   Cyanocobalamin (B-12 PO), Take by mouth daily., Disp: , Rfl:    diclofenac Sodium (VOLTAREN) 1 % GEL, Apply 4 g topically 4 (four) times daily., Disp: 350 g, Rfl: 0   dicyclomine (BENTYL) 20 MG tablet, TAKE 1 TABLET BY MOUTH FOUR TIMES DAILY BEFORE MEALS AND AT BEDTIME, Disp: 90 tablet, Rfl: 3   diltiazem (TIAZAC) 120 MG 24 hr capsule, Take 1 capsule (120 mg total) by mouth daily., Disp: 90 capsule, Rfl: 3   dorzolamide (TRUSOPT) 2 % ophthalmic solution, Place into both eyes 2 (two) times daily. , Disp: , Rfl:    escitalopram (LEXAPRO) 10 MG tablet, Take 2 tablets (20  mg total) by mouth daily., Disp: 180 tablet, Rfl: 3   fluticasone (FLONASE) 50 MCG/ACT nasal spray, Place 1 spray into both nostrils daily., Disp: 15 mL, Rfl: 2   furosemide (LASIX) 40 MG tablet, Take 1 tablet (40 mg total) by mouth daily., Disp: 90 tablet, Rfl: 3   gabapentin (NEURONTIN) 100 MG capsule, Take 100 mg by mouth at bedtime., Disp: , Rfl:    ibuprofen (ADVIL) 800 MG tablet, TAKE 1 TABLET(800 MG) BY MOUTH DAILY AS NEEDED FOR MODERATE PAIN, Disp: 90 tablet, Rfl: 1   ketorolac (ACULAR) 0.4 % SOLN, 1 drop 4 (four) times daily., Disp: , Rfl:    latanoprost (XALATAN) 0.005 % ophthalmic solution, Place 1 drop into both eyes daily., Disp: 2.5 mL, Rfl: 11   levocetirizine (XYZAL) 5 MG tablet, Take 2.5 mg by mouth every evening., Disp: , Rfl: 6   losartan (COZAAR) 100 MG tablet, Take 1 tablet (100 mg total) by mouth daily., Disp: 90 tablet, Rfl: 3   multivitamin (THERAGRAN) tablet, Take 1 tablet by mouth daily., Disp: , Rfl:    omeprazole (PRILOSEC) 40 MG capsule, TAKE 1 CAPSULE BY MOUTH EVERY DAY, Disp: 90 capsule, Rfl: 3   potassium chloride (KLOR-CON) 8 MEQ tablet, TAKE 1 TABLET(8 MEQ) BY MOUTH DAILY, Disp: 90 tablet, Rfl: 3   prednisoLONE acetate (PRED FORTE) 1 % ophthalmic suspension, Place 1 drop into the left eye 4 (four) times daily., Disp: , Rfl:  WIXELA INHUB 250-50 MCG/ACT AEPB, 1 puff 2 (two) times daily., Disp: , Rfl:    Medications ordered in this encounter:  No orders of the defined types were placed in this encounter.    *If you need refills on other medications prior to your next appointment, please contact your pharmacy*  Follow-Up: Call back or seek an in-person evaluation if the symptoms worsen or if the condition fails to improve as anticipated.  [facility] [phone]  Other Instructions Take antibiotic (Augmentin) as directed.  Increase fluids.  Get plenty of rest. Use Mucinex for congestion. Use the Tessalon as directed for cough. Please  continue your asthma medications.. Take a daily probiotic (I recommend Align or Culturelle, but even Activia Yogurt may be beneficial).  A humidifier placed in the bedroom may offer some relief for a dry, scratchy throat of nasal irritation.  Read information below on acute bronchitis. Please call or return to clinic if symptoms are not improving.  Acute Bronchitis Bronchitis is when the airways that extend from the windpipe into the lungs get red, puffy, and painful (inflamed). Bronchitis often causes thick spit (mucus) to develop. This leads to a cough. A cough is the most common symptom of bronchitis. In acute bronchitis, the condition usually begins suddenly and goes away over time (usually in 2 weeks). Smoking, allergies, and asthma can make bronchitis worse. Repeated episodes of bronchitis may cause more lung problems.  HOME CARE Rest. Drink enough fluids to keep your pee (urine) clear or pale yellow (unless you need to limit fluids as told by your doctor). Only take over-the-counter or prescription medicines as told by your doctor. Avoid smoking and secondhand smoke. These can make bronchitis worse. If you are a smoker, think about using nicotine gum or skin patches. Quitting smoking will help your lungs heal faster. Reduce the chance of getting bronchitis again by: Washing your hands often. Avoiding people with cold symptoms. Trying not to touch your hands to your mouth, nose, or eyes. Follow up with your doctor as told.  GET HELP IF: Your symptoms do not improve after 1 week of treatment. Symptoms include: Cough. Fever. Coughing up thick spit. Body aches. Chest congestion. Chills. Shortness of breath. Sore throat.  GET HELP RIGHT AWAY IF:  You have an increased fever. You have chills. You have severe shortness of breath. You have bloody thick spit (sputum). You throw up (vomit) often. You lose too much body fluid (dehydration). You have a severe headache. You  faint.  MAKE SURE YOU:  Understand these instructions. Will watch your condition. Will get help right away if you are not doing well or get worse. Document Released: 10/16/2007 Document Revised: 12/30/2012 Document Reviewed: 10/20/2012 [facility] Patient Information 2015 [place], [place]. This information is not intended to replace advice given to you by your health care provider. Make sure you discuss any questions you have with your health care provider.    If you have been instructed to have an in-person evaluation today at a local Urgent Care facility, please use the link below. It will take you to a list of all of our available [facility], including address, phone number and hours of operation. Please do not delay care.  [facility]  If you or a family member do not have a primary care provider, use the link below to schedule a visit and establish care. When you choose a [facility] primary care physician or advanced practice provider, you gain a  long-term partner in health. Find a Primary Care Provider  Learn more about [facility]'s in-office and virtual care options: [facility] - Get Care Now

## 2023-06-05 NOTE — Progress Notes (Signed)
Virtual Visit Consent   Dominique Johnston, you are scheduled for a virtual visit with a [facility] provider today. Just as with appointments in the office, your consent must be obtained to participate. Your consent will be active for this visit and any virtual visit you may have with one of our providers in the next 365 days. If you have a MyChart account, a copy of this consent can be sent to you electronically.  As this is a virtual visit, video technology does not allow for your provider to perform a traditional examination. This may limit your provider's ability to fully assess your condition. If your provider identifies any concerns that need to be evaluated in person or the need to arrange testing (such as labs, EKG, etc.), we will make arrangements to do so. Although advances in technology are sophisticated, we cannot ensure that it will always work on either your end or our end. If the connection with a video visit is poor, the visit may have to be switched to a telephone visit. With either a video or telephone visit, we are not always able to ensure that we have a secure connection.  By engaging in this virtual visit, you consent to the provision of healthcare and authorize for your insurance to be billed (if applicable) for the services provided during this visit. Depending on your insurance coverage, you may receive a charge related to this service.  I need to obtain your verbal consent now. Are you willing to proceed with your visit today? Dominique Johnston has provided verbal consent on 06/05/2023 for a virtual visit (video or telephone). Piedad Climes, [place]  Date: 06/05/2023 8:19 AM  Virtual Visit via Video Note   I, Piedad Climes, connected with  Dominique Johnston  ([id number], 1945-05-19) on 06/05/23 at  8:15 AM EST by a video-enabled telemedicine application and verified that I am speaking with the correct person using two  identifiers.  Location: Patient: Virtual Visit Location Patient: Home Provider: Virtual Visit Location Provider: Home Office   I discussed the limitations of evaluation and management by telemedicine and the availability of in person appointments. The patient expressed understanding and agreed to proceed.    History of Present Illness: Dominique Johnston is a 78 y.o. who identifies as a female who was assigned female at birth, and is being seen today for 5 days of coughing and mild chest tightness which she thought was mild flare of her asthma. Subsequently developed more of a productive cough and a low-grade fever. Notes worsening yesterday with increased fever and chest congestion and thick green phlegm. Fever is resolved this morning. Did submit an EV last night for symptoms but did not see treatment results.   OTC -- Ibuprofen.  HPI: HPI  Problems:  Patient Active Problem List   Diagnosis Date Noted   Left hip pain 03/22/2019   Mild intermittent asthma 10/08/2016   Routine health maintenance 10/25/2012   Borderline high cholesterol 01/19/2010   G E REFLUX 12/10/2007   Generalized anxiety disorder 12/04/2007   Essential hypertension 12/01/2006   Allergic rhinitis 12/01/2006   IRRITABLE BOWEL SYNDROME 12/01/2006   Osteoarthritis 12/01/2006    Allergies:  Allergies  Allergen Reactions   Enalapril Maleate     REACTION: swelling, headache   Sulfamethoxazole-Trimethoprim     Abdominal pain, redness eyes   Medications:  Current Outpatient Medications:    albuterol (PROVENTIL HFA;VENTOLIN HFA) 108 (90 Base) MCG/ACT inhaler, Inhale 2 puffs into the lungs  every 6 (six) hours as needed., Disp: 18 g, Rfl: 1   amoxicillin-clavulanate (AUGMENTIN) 875-125 MG tablet, Take 1 tablet by mouth 2 (two) times daily for 5 days., Disp: 10 tablet, Rfl: 0   Ascorbic Acid (VITAMIN C PO), Take 1 tablet by mouth daily., Disp: , Rfl:    benzonatate (TESSALON) 200 MG capsule, Take 1 capsule (200  mg total) by mouth 3 (three) times daily as needed for cough., Disp: 30 capsule, Rfl: 0   buPROPion (WELLBUTRIN XL) 300 MG 24 hr tablet, Take 1 tablet (300 mg total) by mouth daily., Disp: 90 tablet, Rfl: 3   Cyanocobalamin (B-12 PO), Take by mouth daily., Disp: , Rfl:    diclofenac Sodium (VOLTAREN) 1 % GEL, Apply 4 g topically 4 (four) times daily., Disp: 350 g, Rfl: 0   dicyclomine (BENTYL) 20 MG tablet, TAKE 1 TABLET BY MOUTH FOUR TIMES DAILY BEFORE MEALS AND AT BEDTIME, Disp: 90 tablet, Rfl: 3   diltiazem (TIAZAC) 120 MG 24 hr capsule, Take 1 capsule (120 mg total) by mouth daily., Disp: 90 capsule, Rfl: 3   dorzolamide (TRUSOPT) 2 % ophthalmic solution, Place into both eyes 2 (two) times daily. , Disp: , Rfl:    escitalopram (LEXAPRO) 10 MG tablet, Take 2 tablets (20 mg total) by mouth daily., Disp: 180 tablet, Rfl: 3   fluticasone (FLONASE) 50 MCG/ACT nasal spray, Place 1 spray into both nostrils daily., Disp: 15 mL, Rfl: 2   furosemide (LASIX) 40 MG tablet, Take 1 tablet (40 mg total) by mouth daily., Disp: 90 tablet, Rfl: 3   gabapentin (NEURONTIN) 100 MG capsule, Take 100 mg by mouth at bedtime., Disp: , Rfl:    ibuprofen (ADVIL) 800 MG tablet, TAKE 1 TABLET(800 MG) BY MOUTH DAILY AS NEEDED FOR MODERATE PAIN, Disp: 90 tablet, Rfl: 1   ketorolac (ACULAR) 0.4 % SOLN, 1 drop 4 (four) times daily., Disp: , Rfl:    latanoprost (XALATAN) 0.005 % ophthalmic solution, Place 1 drop into both eyes daily., Disp: 2.5 mL, Rfl: 11   levocetirizine (XYZAL) 5 MG tablet, Take 2.5 mg by mouth every evening., Disp: , Rfl: 6   losartan (COZAAR) 100 MG tablet, Take 1 tablet (100 mg total) by mouth daily., Disp: 90 tablet, Rfl: 3   multivitamin (THERAGRAN) tablet, Take 1 tablet by mouth daily., Disp: , Rfl:    omeprazole (PRILOSEC) 40 MG capsule, TAKE 1 CAPSULE BY MOUTH EVERY DAY, Disp: 90 capsule, Rfl: 3   potassium chloride (KLOR-CON) 8 MEQ tablet, TAKE 1 TABLET(8 MEQ) BY MOUTH DAILY, Disp: 90 tablet, Rfl:  3   prednisoLONE acetate (PRED FORTE) 1 % ophthalmic suspension, Place 1 drop into the left eye 4 (four) times daily., Disp: , Rfl:    WIXELA INHUB 250-50 MCG/ACT AEPB, 1 puff 2 (two) times daily., Disp: , Rfl:   Observations/Objective: Patient is well-developed, well-nourished in no acute distress.  Resting comfortably at home.  Head is normocephalic, atraumatic.  No labored breathing. Speech is clear and coherent with logical content.  Patient is alert and oriented at baseline.   Assessment and Plan: 1. Acute bacterial bronchitis (Primary)  During EV last night Augmentin sent in. Will have her pick up and start taking as directed. Tessalon per orders. Supportive measures and OTC medications reviewed. Breathing stable at present. Continue asthma medications. Strict PCP follow-up and ER precautions reviewed.   Follow Up Instructions: I discussed the assessment and treatment plan with the patient. The patient was provided an opportunity to ask questions  and all were answered. The patient agreed with the plan and demonstrated an understanding of the instructions.  A copy of instructions were sent to the patient via MyChart unless otherwise noted below.   The patient was advised to call back or seek an in-person evaluation if the symptoms worsen or if the condition fails to improve as anticipated.    Piedad Climes, PA-C

## 2023-06-11 DIAGNOSIS — H401232 Low-tension glaucoma, bilateral, moderate stage: Secondary | ICD-10-CM | POA: Diagnosis not present

## 2023-06-11 IMAGING — MG DIGITAL SCREENING BILAT W/ CAD
5 series · 5 of 5 positions shown · non-contrast
Comparison: Previous exam(s).

CLINICAL DATA: Screening.

EXAM:
DIGITAL SCREENING BILATERAL MAMMOGRAM WITH CAD
TECHNIQUE: Bilateral screening digital craniocaudal and mediolateral oblique
mammograms were obtained. The images were evaluated with
computer-aided detection.

[R CC (1 of 2)]
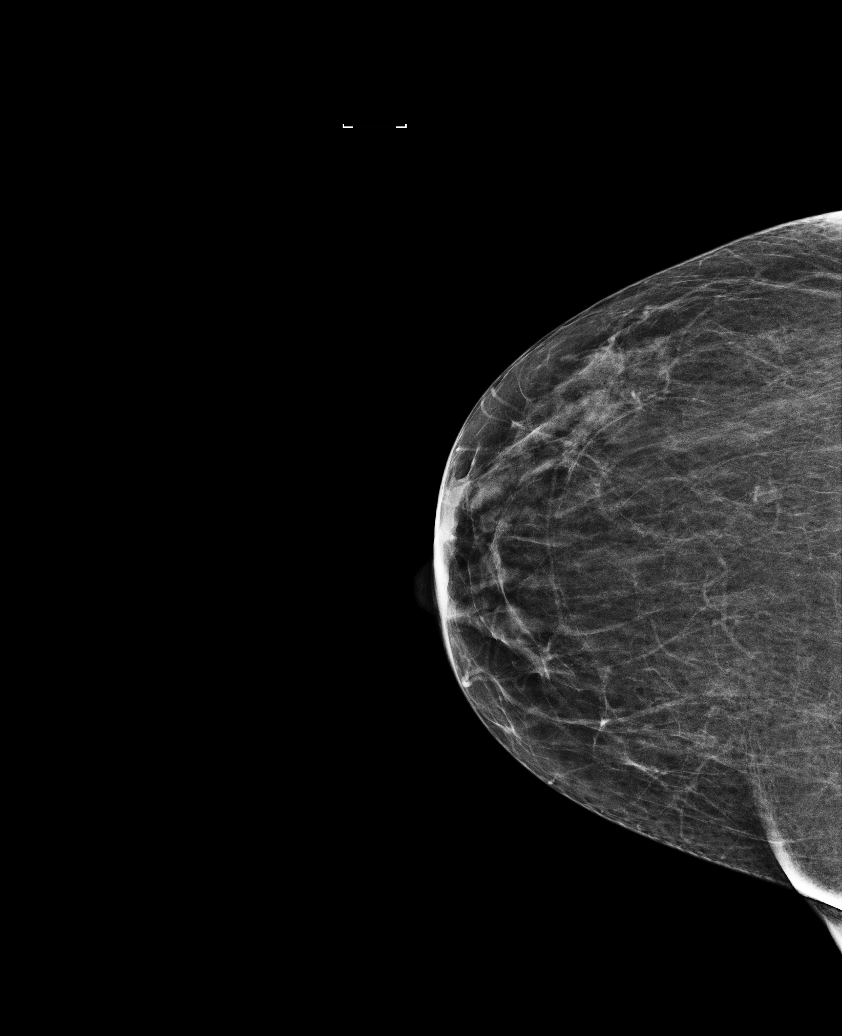

[L CC]
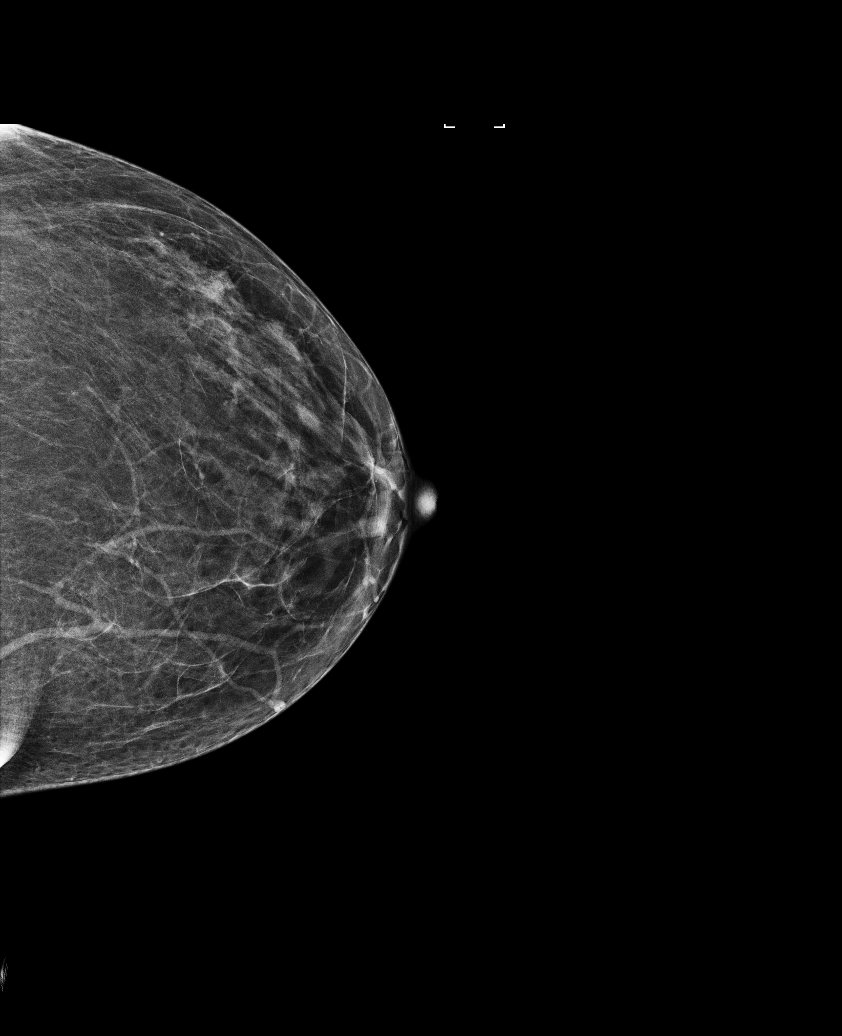

[R MLO]
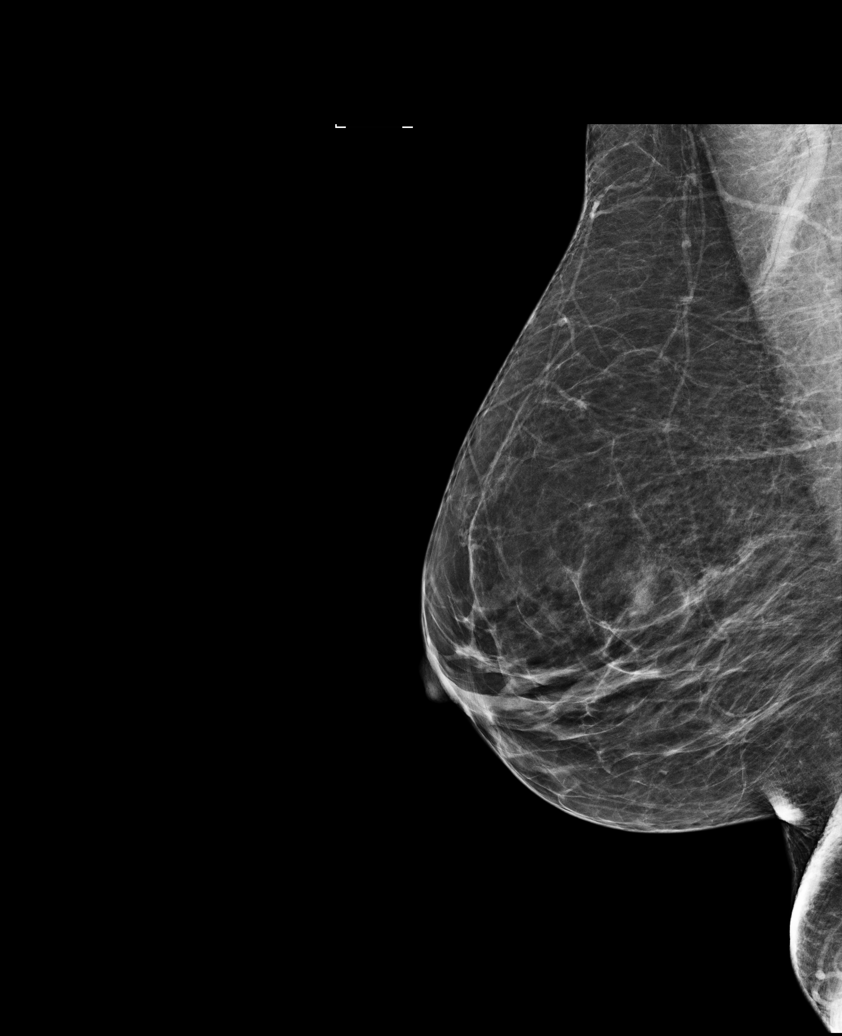

[L MLO]
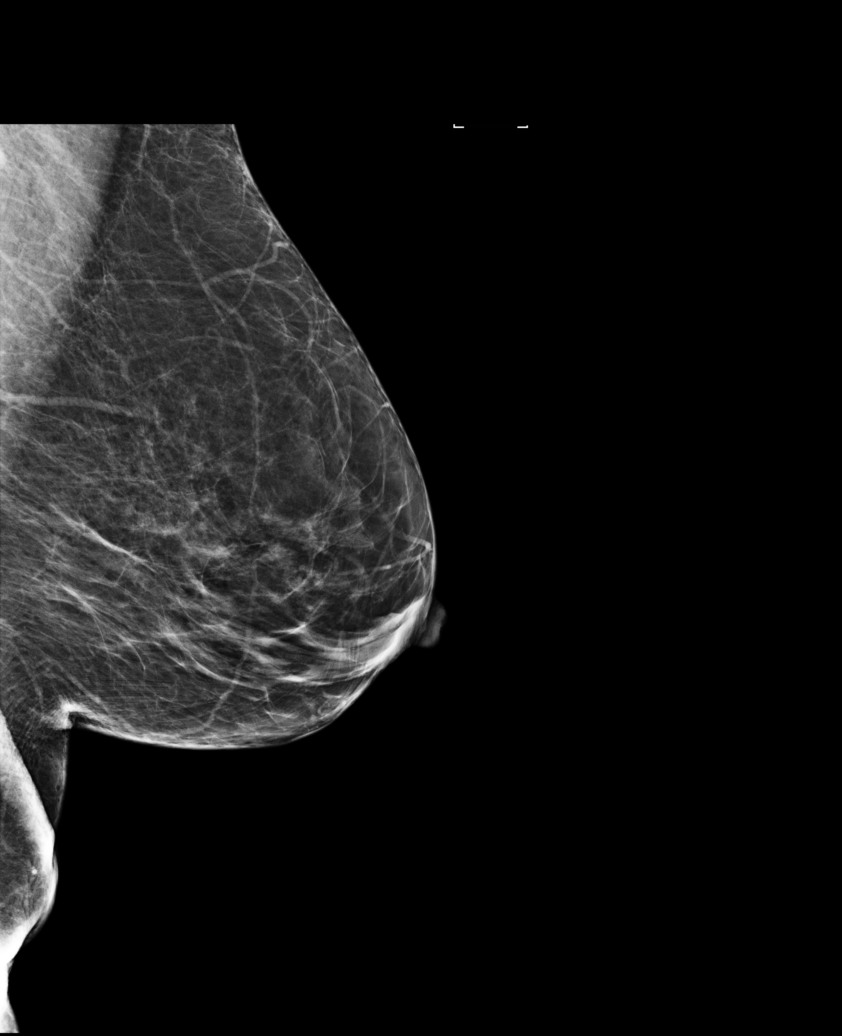

[R CC (2 of 2)]
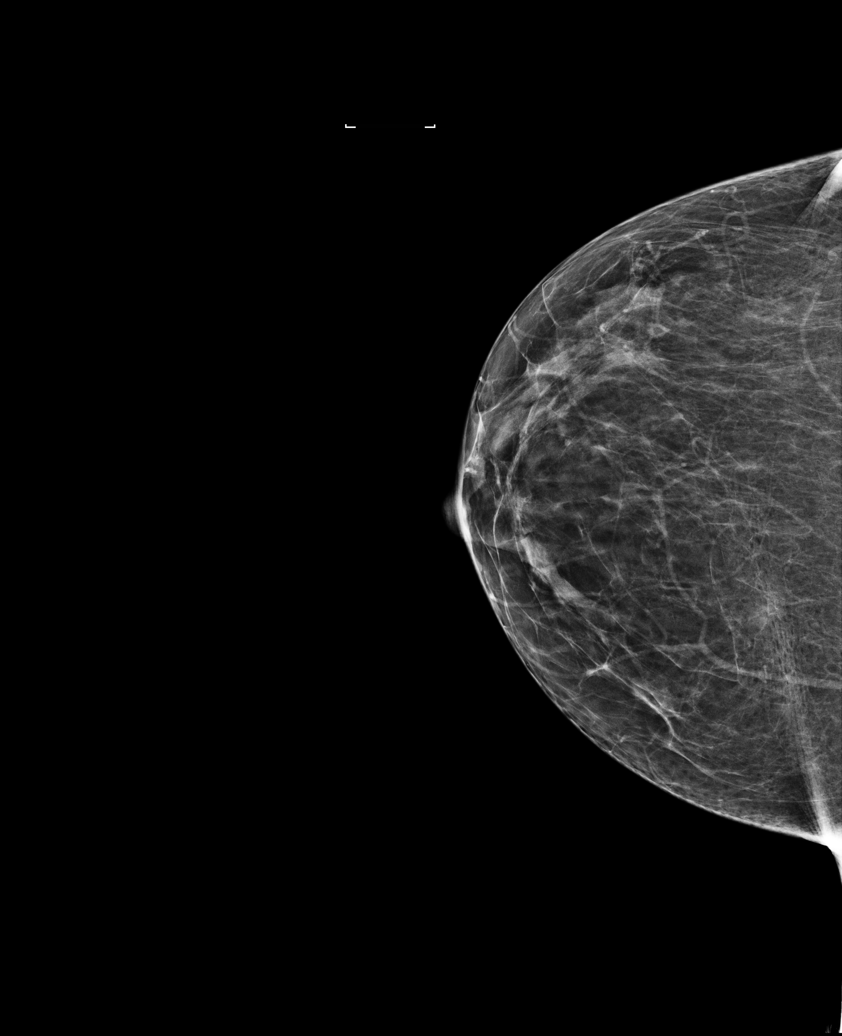

[5 of 5 positions shown; findings below may reference images not displayed]

ACR Breast Density Category b: There are scattered areas of
fibroglandular density.
FINDINGS: There are no findings suspicious for malignancy.
IMPRESSION: No mammographic evidence of malignancy. A result letter of this
screening mammogram will be mailed directly to the patient.

RECOMMENDATION:
Screening mammogram in one year. (Code:WO-V-ZRK)

BI-RADS CATEGORY  1: Negative.

## 2023-06-23 ENCOUNTER — Ambulatory Visit (INDEPENDENT_AMBULATORY_CARE_PROVIDER_SITE_OTHER): Payer: PPO

## 2023-06-23 VITALS — Ht 66.0 in | Wt 189.0 lb

## 2023-06-23 DIAGNOSIS — Z1212 Encounter for screening for malignant neoplasm of rectum: Secondary | ICD-10-CM

## 2023-06-23 DIAGNOSIS — Z1211 Encounter for screening for malignant neoplasm of colon: Secondary | ICD-10-CM

## 2023-06-23 DIAGNOSIS — Z0000 Encounter for general adult medical examination without abnormal findings: Secondary | ICD-10-CM

## 2023-06-23 DIAGNOSIS — Z780 Asymptomatic menopausal state: Secondary | ICD-10-CM | POA: Diagnosis not present

## 2023-06-23 NOTE — Progress Notes (Signed)
 Subjective:   Dominique Johnston is a 78 y.o. female who presents for Medicare Annual (Subsequent) preventive examination.  Visit Complete: Virtual I connected with  Anda Bamberg on 06/23/23 by a audio enabled telemedicine application and verified that I am speaking with the correct person using two identifiers.  Patient Location: Home  Provider Location: Home Office  I discussed the limitations of evaluation and management by telemedicine. The patient expressed understanding and agreed to proceed.  Vital Signs: Because this visit was a virtual/telehealth visit, some criteria may be missing or patient reported. Any vitals not documented were not able to be obtained and vitals that have been documented are patient reported.  Patient Medicare AWV questionnaire was completed by the patient on 06/23/23; I have confirmed that all information answered by patient is correct and no changes since this date.  Cardiac Risk Factors include: advanced age (>27men, >40 women);hypertension     Objective:    Today's Vitals   06/23/23 0920  Weight: 189 lb (85.7 kg)  Height: 5\' 6"  (1.676 m)   Body mass index is 30.51 kg/m.     06/23/2023   10:04 AM 05/29/2023   10:54 AM 06/20/2022   10:16 AM 06/19/2021   10:08 AM 05/09/2020    3:11 PM 04/27/2019    1:18 PM 04/01/2018    9:41 AM  Advanced Directives  Does Patient Have a Medical Advance Directive? Yes No No No No No No  Type of Estate agent of [place];Living will        Does patient want to make changes to medical advance directive?      Yes (ED - Information included in AVS) Yes (ED - Information included in AVS)  Copy of Healthcare Power of Attorney in Chart? No - copy requested        Would patient like information on creating a medical advance directive?   No - Patient declined Yes (MAU/Ambulatory/Procedural Areas - Information given) Yes (MAU/Ambulatory/Procedural Areas - Information given)       Current Medications (verified) Outpatient Encounter Medications as of 06/23/2023  Medication Sig   albuterol  (PROVENTIL  HFA;VENTOLIN  HFA) 108 (90 Base) MCG/ACT inhaler Inhale 2 puffs into the lungs every 6 (six) hours as needed.   Ascorbic Acid (VITAMIN C PO) Take 1 tablet by mouth daily.   benzonatate  (TESSALON ) 200 MG capsule Take 1 capsule (200 mg total) by mouth 3 (three) times daily as needed for cough.   buPROPion  (WELLBUTRIN  XL) 300 MG 24 hr tablet Take 1 tablet (300 mg total) by mouth daily.   Cyanocobalamin  (B-12 PO) Take by mouth daily.   diclofenac  Sodium (VOLTAREN ) 1 % GEL Apply 4 g topically 4 (four) times daily.   dicyclomine  (BENTYL ) 20 MG tablet TAKE 1 TABLET BY MOUTH FOUR TIMES DAILY BEFORE MEALS AND AT BEDTIME   diltiazem  (TIAZAC ) 120 MG 24 hr capsule Take 1 capsule (120 mg total) by mouth daily.   dorzolamide (TRUSOPT) 2 % ophthalmic solution Place into both eyes 2 (two) times daily.    escitalopram  (LEXAPRO ) 10 MG tablet Take 2 tablets (20 mg total) by mouth daily.   fluticasone  (FLONASE ) 50 MCG/ACT nasal spray Place 1 spray into both nostrils daily.   furosemide  (LASIX ) 40 MG tablet Take 1 tablet (40 mg total) by mouth daily.   gabapentin (NEURONTIN) 100 MG capsule Take 100 mg by mouth at bedtime.   ibuprofen  (ADVIL ) 800 MG tablet TAKE 1 TABLET(800 MG) BY MOUTH DAILY AS NEEDED FOR MODERATE PAIN  ketorolac (ACULAR) 0.4 % SOLN 1 drop 4 (four) times daily.   latanoprost  (XALATAN ) 0.005 % ophthalmic solution Place 1 drop into both eyes daily.   levocetirizine (XYZAL) 5 MG tablet Take 2.5 mg by mouth every evening.   losartan  (COZAAR ) 100 MG tablet Take 1 tablet (100 mg total) by mouth daily.   multivitamin (THERAGRAN) tablet Take 1 tablet by mouth daily.   omeprazole  (PRILOSEC) 40 MG capsule TAKE 1 CAPSULE BY MOUTH EVERY DAY   potassium chloride  (KLOR-CON ) 8 MEQ tablet TAKE 1 TABLET(8 MEQ) BY MOUTH DAILY   prednisoLONE acetate (PRED FORTE) 1 % ophthalmic suspension  Place 1 drop into the left eye 4 (four) times daily.   WIXELA INHUB 250-50 MCG/ACT AEPB 1 puff 2 (two) times daily.   [DISCONTINUED] diltiazem  (DILACOR XR ) 120 MG 24 hr capsule Take 1 capsule (120 mg total) by mouth daily.   No facility-administered encounter medications on file as of 06/23/2023.    Allergies (verified) Enalapril maleate and Sulfamethoxazole-trimethoprim   History: Past Medical History:  Diagnosis Date   Allergy    rhinitis   Asthma    Bouchard nodes (DJD hand)    Bronchitis    recurrent   Cataract    Depression    DJD (degenerative joint disease) of knee    DJD (degenerative joint disease), lumbosacral    GERD (gastroesophageal reflux disease)    Headache(784.0)    Headache(784.0)    Hyperlipidemia    Hypertension    IBS (irritable bowel syndrome)    Sinusitis, chronic    Past Surgical History:  Procedure Laterality Date   BREAST EXCISIONAL BIOPSY Left 1998   CATARACT EXTRACTION Right    04/21/18   ORIF ANKLE FRACTURE  '07   Left ankle   SEPTOPLASTY  2000   TONSILLECTOMY     transnasal laryngoscopy  2005   TUBAL LIGATION  1977   Family History  Problem Relation Age of Onset   Hypertension Father    Coronary artery disease Father        MI/ Fatal   Breast cancer Neg Hx    Diabetes Neg Hx    Colon cancer Neg Hx    Social History   Socioeconomic History   Marital status: Divorced    Spouse name: Not on file   Number of children: 2   Years of education: 17   Highest education level: Not on file  Occupational History    Employer: [employer]    Comment: retired  Tobacco Use   Smoking status: Never   Smokeless tobacco: Never  Vaping Use   Vaping status: Never Used  Substance and Sexual Activity   Alcohol use: Never   Drug use: Not Currently   Sexual activity: Never  Other Topics Concern   Not on file  Social History Narrative   [employer] -BS, has had additional course work.  married '71- after 29 years husband left, divorce final  in '06.  1 son- '74, 1 daughter '76; 2 grandchildren. 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 Lives in her own home, daughter and grandchild home with her. ACP - HCPOA dtr - tiffany (c) [phone] - [id number]. CPR - ?, no mechanical ventilation except short term care, no prolonged artificial nutrition.    Social Drivers of Health   Financial Resource Strain: Medium Risk (06/23/2023)   Overall Financial Resource Strain (CARDIA)    Difficulty of Paying Living Expenses: Somewhat hard  Food  Insecurity: No Food Insecurity (06/23/2023)   Hunger Vital Sign    Worried About Running Out of Food in the Last Year: Never true    Ran Out of Food in the Last Year: Never true  Recent Concern: Food Insecurity - Food Insecurity Present (06/04/2023)   Hunger Vital Sign    Worried About Running Out of Food in the Last Year: Sometimes true    Ran Out of Food in the Last Year: Never true  Transportation Needs: No Transportation Needs (06/23/2023)   PRAPARE - Administrator, Civil Service (Medical): No    Lack of Transportation (Non-Medical): No  Physical Activity: Inactive (06/23/2023)   Exercise Vital Sign    Days of Exercise per Week: 0 days    Minutes of Exercise per Session: 0 min  Stress: No Stress Concern Present (06/23/2023)   [employer] of Occupational Health - Occupational Stress Questionnaire    Feeling of Stress : Not at all  Social Connections: Socially Isolated (06/23/2023)   Social Connection and Isolation Panel [NHANES]    Frequency of Communication with Friends and Family: Twice a week    Frequency of Social Gatherings with Friends and Family: More than three times a week    Attends Religious Services: Never    Database administrator or Organizations: No    Attends Engineer, structural: Never    Marital Status: Divorced    Tobacco Counseling Counseling given: Not Answered   Clinical Intake:  Pre-visit preparation  completed: Yes  Pain : No/denies pain     BMI - recorded: 30.51 Nutritional Status: BMI > 30  Obese Nutritional Risks: None  How often do you need to have someone help you when you read instructions, pamphlets, or other written materials from your doctor or pharmacy?: 1 - Never  Interpreter Needed?: No  Information entered by :: Lewin Pellow, RMA   Activities of Daily Living    06/23/2023    1:15 AM  In your present state of health, do you have any difficulty performing the following activities:  Hearing? 0  Vision? 0  Difficulty concentrating or making decisions? 0  Dressing or bathing? 0  Doing errands, shopping? 0  Preparing Food and eating ? N  Using the Toilet? N  In the past six months, have you accidently leaked urine? Y  Do you have problems with loss of bowel control? N  Managing your Medications? N  Managing your Finances? N  Housekeeping or managing your Housekeeping? N    Patient Care Team: Adelia Homestead, MD as PCP - General (Internal Medicine) Zara Heymann, MD (Allergy) Romine, Armandina Lana, MD (Obstetrics and Gynecology) Shermon Divine, MD as Consulting Physician (Sports Medicine) Alvina Axon, MD as Consulting Physician (Ophthalmology) Jonathan Neighbor, [facility] (Inactive) as Pharmacist (Pharmacist) [facility] as Consulting Physician (Ophthalmology)  Indicate any recent Medical Services you may have received from other than [facility] providers in the past year (date may be approximate).     Assessment:   This is a routine wellness examination for Jaynia.  Hearing/Vision screen Hearing Screening - Comments:: Denies hearing difficulties   Vision Screening - Comments:: Wares eyeglasses   Goals Addressed               This Visit's Progress     Patient Stated (pt-stated)        Would like to join Silver sneakers and keep weight under 200 lb      Depression Screen  06/23/2023    9:48 AM 08/07/2022   10:09  AM 06/20/2022   10:15 AM 06/19/2021   10:12 AM 05/09/2020    3:17 PM 05/09/2020    3:14 PM 04/27/2019    1:22 PM  PHQ 2/9 Scores  PHQ - 2 Score 1 1 0 0 0 0 2  PHQ- 9 Score 1      3    Fall Risk    06/23/2023    1:15 AM 08/07/2022   10:09 AM 06/20/2022   10:18 AM 06/19/2021   10:10 AM 05/09/2020    2:55 PM  Fall Risk   Falls in the past year? 1 0 0 0 1  Number falls in past yr: 0 0 0 0 0  Injury with Fall? 0 0 0 0 0  Risk for fall due to :  No Fall Risks No Fall Risks No Fall Risks Other (Comment)  Risk for fall due to: Comment     running behind cat  Follow up Falls evaluation completed;Falls prevention discussed Falls evaluation completed Falls prevention discussed Falls evaluation completed Falls evaluation completed    MEDICARE RISK AT HOME: Medicare Risk at Home Any stairs in or around the home?: (Patient-Rptd) Yes If so, are there any without handrails?: (Patient-Rptd) No Home free of loose throw rugs in walkways, pet beds, electrical cords, etc?: (Patient-Rptd) Yes Adequate lighting in your home to reduce risk of falls?: (Patient-Rptd) Yes Life alert?: (Patient-Rptd) No Use of a cane, walker or w/c?: (Patient-Rptd) No Grab bars in the bathroom?: (Patient-Rptd) No Shower chair or bench in shower?: (Patient-Rptd) No Elevated toilet seat or a handicapped toilet?: (Patient-Rptd) Yes  TIMED UP AND GO:  Was the test performed?  No    Cognitive Function:    02/27/2015   11:46 AM  MMSE - Mini Mental State Exam  Not completed: --        06/23/2023    9:22 AM 06/20/2022   10:20 AM  6CIT Screen  What Year? 0 points 0 points  What month? 0 points 0 points  What time? 0 points 0 points  Count back from 20 0 points 0 points  Months in reverse 0 points 0 points  Repeat phrase 0 points 0 points  Total Score 0 points 0 points    Immunizations Immunization History  Administered Date(s) Administered   Fluad Quad(high Dose 65+) 02/16/2019, 03/04/2021   Influenza Split  04/05/2011   Influenza Whole 04/22/2007, 02/23/2008, 02/21/2009, 03/06/2009, 02/28/2010, 01/26/2014   Influenza, High Dose Seasonal PF 03/06/2017   Influenza,inj,Quad PF,6+ Mos 01/17/2016   Influenza-Unspecified 02/21/2015, 02/10/2018, 03/13/2020, 02/04/2022   PFIZER(Purple Top)SARS-COV-2 Vaccination 07/28/2019, 08/25/2019, 03/27/2020, 12/12/2020   Pfizer Covid-19 Vaccine Bivalent Booster 17yrs & up 03/04/2021, 02/04/2022   Pneumococcal Conjugate-13 03/16/2015   Pneumococcal Polysaccharide-23 05/29/2005, 04/05/2011, 06/19/2020   Respiratory Syncytial Virus Vaccine,Recomb Aduvanted(Arexvy) 02/25/2022   Tdap 05/03/2019   Zoster Recombinant(Shingrix) 02/25/2022   Zoster, Live 07/26/2009    TDAP status: Up to date  Flu Vaccine status: Up to date  Pneumococcal vaccine status: Up to date  Covid-19 vaccine status: Completed vaccines  Qualifies for Shingles Vaccine? Yes   Zostavax completed Yes   Shingrix Completed?: Yes  Screening Tests Health Maintenance  Topic Date Due   Zoster Vaccines- Shingrix (2 of 2) 04/22/2022   INFLUENZA VACCINE  12/12/2022   COVID-19 Vaccine (7 - 2024-25 season) 01/12/2023   Medicare Annual Wellness (AWV)  06/22/2024   DTaP/Tdap/Td (2 - Td or Tdap) 05/02/2029   Pneumonia Vaccine  87+ Years old  Completed   DEXA SCAN  Completed   Hepatitis C Screening  Completed   HPV VACCINES  Aged Out   Colonoscopy  Discontinued    Health Maintenance  Health Maintenance Due  Topic Date Due   Zoster Vaccines- Shingrix (2 of 2) 04/22/2022   INFLUENZA VACCINE  12/12/2022   COVID-19 Vaccine (7 - 2024-25 season) 01/12/2023    Colorectal cancer screening: Referral to GI placed 06/23/2023. Pt aware the office will call re: appt.  Mammogram status: Completed 04/25/2023. Repeat every year  Bone Density status: Ordered 06/23/2023. Pt provided with contact info and advised to call to schedule appt.  Lung Cancer Screening: (Low Dose CT Chest recommended if Age 28-80  years, 20 pack-year currently smoking OR have quit w/in 15years.) does not qualify.   Lung Cancer Screening Referral: n/a  Additional Screening:  Hepatitis C Screening: does qualify; Completed 10/22/2012  Vision Screening: Recommended annual ophthalmology exams for early detection of glaucoma and other disorders of the eye. Is the patient up to date with their annual eye exam?  Yes  Who is the provider or what is the name of the office in which the patient attends annual eye exams? Dr. Terrall Ferraris If pt is not established with a provider, would they like to be referred to a provider to establish care? No .   Dental Screening: Recommended annual dental exams for proper oral hygiene   Community Resource Referral / Chronic Care Management: CRR required this visit?  No   CCM required this visit?  No     Plan:     I have personally reviewed and noted the following in the patient's chart:   Medical and social history Use of alcohol, tobacco or illicit drugs  Current medications and supplements including opioid prescriptions. Patient is not currently taking opioid prescriptions. Functional ability and status Nutritional status Physical activity Advanced directives List of other physicians Hospitalizations, surgeries, and ER visits in previous 12 months Vitals Screenings to include cognitive, depression, and falls Referrals and appointments  In addition, I have reviewed and discussed with patient certain preventive protocols, quality metrics, and best practice recommendations. A written personalized care plan for preventive services as well as general preventive health recommendations were provided to patient.     Vaishali Baise L Giles Currie, CMA   06/23/2023   After Visit Summary: (MyChart) Due to this being a telephonic visit, the after visit summary with patients personalized plan was offered to patient via MyChart   Nurse Notes: Patient is due for a colonoscopy and a DEXA, which orders have  been placed today.  Patient scheduled a CPE with Dr. Nicolette Barrio during this visit.  She would like to get Advance Directive forms given to her during her next office visit with Dr. Nicolette Barrio.  Patient had no other concerns to address today.

## 2023-06-23 NOTE — Patient Instructions (Signed)
 Ms. Cieslik , Thank you for taking time to come for your Medicare Wellness Visit. I appreciate your ongoing commitment to your health goals. Please review the following plan we discussed and let me know if I can assist you in the future.   Referrals/Orders/Follow-Ups/Clinician Recommendations: It was nice talking with you today.  Each day, aim for 6 glasses of water, plenty of protein in your diet and try to get up and walk/ stretch every hour for 5-10 minutes at a time.  You are due for a colonoscopy and a Bone density.  Please call to schedule appointments.  [facility], [address] [place] [address], [place], [place] [address], [phone].  You have an order for:  [x]   Bone Density     Please call for appointment:  [facility] [address] [place], [place] [address] [phone]  Make sure to wear two-piece clothing.  No lotions, powders, or deodorants the day of the appointment. Make sure to bring picture ID and insurance card.  Bring list of medications you are currently taking including any supplements.   Schedule your [place] screening mammogram through MyChart!   Log into your MyChart account.  Go to 'Visit' (or 'Appointments' if on mobile App) --> Schedule an Appointment  Under 'Select a Reason for Visit' choose the Mammogram Screening option.  Complete the pre-visit questions and select the time and place that best fits your schedule.    This is a list of the screening recommended for you and due dates:  Health Maintenance  Topic Date Due   Zoster (Shingles) Vaccine (2 of 2) 04/22/2022   Flu Shot  12/12/2022   COVID-19 Vaccine (7 - 2024-25 season) 01/12/2023   Medicare Annual Wellness Visit  06/21/2023   DTaP/Tdap/Td vaccine (2 - Td or Tdap) 05/02/2029   Pneumonia Vaccine  Completed   DEXA scan (bone density measurement)  Completed   Hepatitis C Screening  Completed   HPV Vaccine  Aged Out   Colon Cancer Screening   Discontinued    Advanced directives: (Copy Requested) Please bring a copy of your health care power of attorney and living will to the office to be added to your chart at your convenience.  Next Medicare Annual Wellness Visit scheduled for next year: Yes

## 2023-07-01 ENCOUNTER — Telehealth: Payer: Self-pay | Admitting: Gastroenterology

## 2023-07-01 NOTE — Telephone Encounter (Signed)
Good afternoon Dr. Adela Lank Supervising MD PM  The following patient is calling to find out if at her age of 57, should she continue to have a colonoscopy. 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 Please advise of your recommendations. Thank you.

## 2023-07-01 NOTE — Telephone Encounter (Signed)
I think may be best to see her in the office to discuss options. Most of the time if no prior polyps, we stop doing routine colonoscopy at age 78, but can discuss further with her at office visit

## 2023-07-03 ENCOUNTER — Encounter: Payer: Self-pay | Admitting: Gastroenterology

## 2023-07-03 NOTE — Telephone Encounter (Signed)
PT scheduled for OV 08/15/23 with [facility]

## 2023-08-07 ENCOUNTER — Ambulatory Visit: Payer: PPO | Admitting: Internal Medicine

## 2023-08-11 ENCOUNTER — Other Ambulatory Visit: Payer: Self-pay | Admitting: Internal Medicine

## 2023-08-15 ENCOUNTER — Ambulatory Visit: Payer: PPO | Admitting: Gastroenterology

## 2023-08-18 ENCOUNTER — Other Ambulatory Visit: Payer: Self-pay | Admitting: Internal Medicine

## 2023-08-18 NOTE — Telephone Encounter (Unsigned)
 Copied from CRM [id number]. Topic: Clinical - Prescription Issue >> Aug 18, 2023  2:51 PM Dominique Johnston wrote: Reason for CRM: Patients buPROPion (WELLBUTRIN XL) 300 MG 24 hr tablet was refused due to patient needing an appointment with provider, patient originally had an appointment on 3/27 but provider was out and patient had to reschedule, patient has an appointment scheduled for 4/10 and patient is out of medication.  [name] [phone]

## 2023-08-19 ENCOUNTER — Encounter: Payer: Self-pay | Admitting: Internal Medicine

## 2023-08-20 DIAGNOSIS — J4530 Mild persistent asthma, uncomplicated: Secondary | ICD-10-CM | POA: Diagnosis not present

## 2023-08-20 DIAGNOSIS — J300 Vasomotor rhinitis: Secondary | ICD-10-CM | POA: Diagnosis not present

## 2023-08-20 DIAGNOSIS — K219 Gastro-esophageal reflux disease without esophagitis: Secondary | ICD-10-CM | POA: Diagnosis not present

## 2023-08-20 DIAGNOSIS — J209 Acute bronchitis, unspecified: Secondary | ICD-10-CM | POA: Diagnosis not present

## 2023-08-20 MED ORDER — BUPROPION HCL ER (XL) 300 MG PO TB24: 300.0000 mg | ORAL_TABLET | Freq: Every day | ORAL | 0 refills | Status: DC

## 2023-08-21 ENCOUNTER — Encounter: Payer: Self-pay | Admitting: Internal Medicine

## 2023-08-21 ENCOUNTER — Ambulatory Visit (INDEPENDENT_AMBULATORY_CARE_PROVIDER_SITE_OTHER): Admitting: Internal Medicine

## 2023-08-21 ENCOUNTER — Ambulatory Visit (INDEPENDENT_AMBULATORY_CARE_PROVIDER_SITE_OTHER)

## 2023-08-21 VITALS — BP 160/80 | HR 86 | Ht 66.0 in | Wt 187.0 lb

## 2023-08-21 DIAGNOSIS — Z0001 Encounter for general adult medical examination with abnormal findings: Secondary | ICD-10-CM | POA: Diagnosis not present

## 2023-08-21 DIAGNOSIS — Z0000 Encounter for general adult medical examination without abnormal findings: Secondary | ICD-10-CM

## 2023-08-21 DIAGNOSIS — R053 Chronic cough: Secondary | ICD-10-CM

## 2023-08-21 DIAGNOSIS — K219 Gastro-esophageal reflux disease without esophagitis: Secondary | ICD-10-CM | POA: Diagnosis not present

## 2023-08-21 DIAGNOSIS — I10 Essential (primary) hypertension: Secondary | ICD-10-CM | POA: Diagnosis not present

## 2023-08-21 DIAGNOSIS — E789 Disorder of lipoprotein metabolism, unspecified: Secondary | ICD-10-CM

## 2023-08-21 DIAGNOSIS — R059 Cough, unspecified: Secondary | ICD-10-CM | POA: Diagnosis not present

## 2023-08-21 LAB — LIPID PANEL
Cholesterol: 159 mg/dL (ref 0–200)
HDL: 53.8 mg/dL (ref >39.00)
LDL Cholesterol: 91 mg/dL (ref 0–99)
NonHDL: 104.96
Total CHOL/HDL Ratio: 3
Triglycerides: 68 mg/dL (ref 0.0–149.0)
VLDL: 13.6 mg/dL (ref 0.0–40.0)

## 2023-08-21 LAB — CBC
HCT: 40.3 % (ref 36.0–46.0)
Hemoglobin: 12.6 g/dL (ref 12.0–15.0)
MCHC: 31.2 g/dL (ref 30.0–36.0)
MCV: 74.6 fl — ABNORMAL LOW (ref 78.0–100.0)
Platelets: 219 10*3/uL (ref 150.0–400.0)
RBC: 5.4 Mil/uL — ABNORMAL HIGH (ref 3.87–5.11)
RDW: 15.3 % (ref 11.5–15.5)
WBC: 4.1 10*3/uL (ref 4.0–10.5)

## 2023-08-21 LAB — COMPREHENSIVE METABOLIC PANEL WITH GFR
ALT: 10 U/L (ref 0–35)
AST: 14 U/L (ref 0–37)
Albumin: 4.1 g/dL (ref 3.5–5.2)
Alkaline Phosphatase: 99 U/L (ref 39–117)
BUN: 13 mg/dL (ref 6–23)
CO2: 27 meq/L (ref 19–32)
Calcium: 9 mg/dL (ref 8.4–10.5)
Chloride: 106 meq/L (ref 96–112)
Creatinine, Ser: 1.16 mg/dL (ref 0.40–1.20)
GFR: 45.43 mL/min — ABNORMAL LOW (ref >60.00)
Glucose, Bld: 88 mg/dL (ref 70–99)
Potassium: 3.8 meq/L (ref 3.5–5.1)
Sodium: 142 meq/L (ref 135–145)
Total Bilirubin: 0.4 mg/dL (ref 0.2–1.2)
Total Protein: 7.3 g/dL (ref 6.0–8.3)

## 2023-08-21 MED ORDER — OMEPRAZOLE 40 MG PO CPDR: 40.0000 mg | DELAYED_RELEASE_CAPSULE | Freq: Every day | ORAL | 3 refills | Status: DC

## 2023-08-21 MED ORDER — DICYCLOMINE HCL 20 MG PO TABS: ORAL_TABLET | ORAL | 3 refills | Status: AC

## 2023-08-21 MED ORDER — FUROSEMIDE 40 MG PO TABS: 40.0000 mg | ORAL_TABLET | Freq: Every day | ORAL | 3 refills | Status: DC

## 2023-08-21 MED ORDER — POTASSIUM CHLORIDE ER 8 MEQ PO TBCR: EXTENDED_RELEASE_TABLET | ORAL | 3 refills | Status: AC

## 2023-08-21 MED ORDER — IBUPROFEN 800 MG PO TABS: ORAL_TABLET | ORAL | 1 refills | Status: DC

## 2023-08-21 MED ORDER — ESCITALOPRAM OXALATE 10 MG PO TABS: 20.0000 mg | ORAL_TABLET | Freq: Every day | ORAL | 3 refills | Status: AC

## 2023-08-21 MED ORDER — DILTIAZEM HCL ER BEADS 120 MG PO CP24: 120.0000 mg | ORAL_CAPSULE | Freq: Every day | ORAL | 3 refills | Status: AC

## 2023-08-21 MED ORDER — LOSARTAN POTASSIUM 100 MG PO TABS: 100.0000 mg | ORAL_TABLET | Freq: Every day | ORAL | 3 refills | Status: AC

## 2023-08-21 MED ORDER — BUPROPION HCL ER (XL) 300 MG PO TB24: 300.0000 mg | ORAL_TABLET | Freq: Every day | ORAL | 3 refills | Status: DC

## 2023-08-21 NOTE — Patient Instructions (Signed)
We will check the labs and x-ray today.

## 2023-08-21 NOTE — Progress Notes (Unsigned)
   Subjective:   Patient ID: Dominique Johnston, female    DOB: [birth year], 78 y.o.   MRN: [id number]  HPI The patient is here for physical. Does have a new chronic cough over the last 4-5 months. Treated with antibiotics twice and overall improved but still present. Saw allergist   PMH, [facility], social history reviewed and updated  Review of Systems  Constitutional: Negative.   HENT: Negative.    Eyes: Negative.   Respiratory:  Positive for cough. Negative for chest tightness and shortness of breath.   Cardiovascular:  Negative for chest pain, palpitations and leg swelling.  Gastrointestinal:  Negative for abdominal distention, abdominal pain, constipation, diarrhea, nausea and vomiting.  Musculoskeletal: Negative.   Skin: Negative.   Neurological: Negative.   Psychiatric/Behavioral: Negative.      Objective:  Physical Exam Constitutional:      Appearance: She is well-developed.  HENT:     Head: Normocephalic and atraumatic.  Cardiovascular:     Rate and Rhythm: Normal rate and regular rhythm.  Pulmonary:     Effort: Pulmonary effort is normal. No respiratory distress.     Breath sounds: Normal breath sounds. No wheezing or rales.  Abdominal:     General: Bowel sounds are normal. There is no distension.     Palpations: Abdomen is soft.     Tenderness: There is no abdominal tenderness. There is no rebound.  Musculoskeletal:     Cervical back: Normal range of motion.  Skin:    General: Skin is warm and dry.  Neurological:     Mental Status: She is alert and oriented to person, place, and time.     Coordination: Coordination normal.     Vitals:   08/21/23 0845 08/21/23 0849  BP: (!) 160/80 (!) 160/80  Pulse: 86   TempSrc: Oral   SpO2: 92%   Weight: 187 lb (84.8 kg)   Height: 5\' 6"  (1.676 m)     Assessment & Plan:

## 2023-08-22 NOTE — Assessment & Plan Note (Signed)
Flu shot yearly. Pneumonia complete. Shingrix complete. Tetanus up to date. Colonoscopy up to date. Mammogram up to date, pap smear aged out and dexa complete. Counseled about sun safety and mole surveillance. Counseled about the dangers of distracted driving. Given 10 year screening recommendations.   

## 2023-08-22 NOTE — Assessment & Plan Note (Signed)
 BP elevated today but taking some otc meds. Will monitor closely and keep diltiazem 120 mg daily and lasix daily and losartan daily the same for now. Checking CMP and adjust as needed.

## 2023-08-22 NOTE — Assessment & Plan Note (Signed)
 New chronic cough with multiple rounds of antibiotics recently. I suspect post infectious with GERD playing a role. She did stop her PPI and resumed as few weeks ago. We discussed this can take 2-3 months for improvement with GERD treatment. Checking CXR today to rule out persistent findings s/p antibiotics.

## 2023-08-22 NOTE — Assessment & Plan Note (Signed)
 Had stopped taking her medication for some time. She is now back on it but this could be contributing to her cough.

## 2023-08-22 NOTE — Assessment & Plan Note (Signed)
 Checking lipid panel and adjust as needed.

## 2023-08-25 ENCOUNTER — Other Ambulatory Visit: Payer: Self-pay | Admitting: Internal Medicine

## 2023-09-15 ENCOUNTER — Telehealth: Payer: Self-pay

## 2023-09-15 NOTE — Telephone Encounter (Signed)
 Patient was identified as falling into the [place] Measure - Diabetes.   Patient was: Patient refuses intervention:  No reason given/other. Patient is not a diabetic per chart.Dominique Johnston

## 2023-09-20 ENCOUNTER — Other Ambulatory Visit: Payer: Self-pay | Admitting: Family Medicine

## 2023-10-07 DIAGNOSIS — H35353 Cystoid macular degeneration, bilateral: Secondary | ICD-10-CM | POA: Diagnosis not present

## 2023-10-07 DIAGNOSIS — H35033 Hypertensive retinopathy, bilateral: Secondary | ICD-10-CM | POA: Diagnosis not present

## 2023-10-07 DIAGNOSIS — H401132 Primary open-angle glaucoma, bilateral, moderate stage: Secondary | ICD-10-CM | POA: Diagnosis not present

## 2023-10-07 DIAGNOSIS — Z961 Presence of intraocular lens: Secondary | ICD-10-CM | POA: Diagnosis not present

## 2023-10-07 DIAGNOSIS — H43813 Vitreous degeneration, bilateral: Secondary | ICD-10-CM | POA: Diagnosis not present

## 2023-10-07 DIAGNOSIS — H35373 Puckering of macula, bilateral: Secondary | ICD-10-CM | POA: Diagnosis not present

## 2023-10-14 ENCOUNTER — Encounter: Payer: Self-pay | Admitting: Gastroenterology

## 2023-10-14 ENCOUNTER — Ambulatory Visit: Admitting: Gastroenterology

## 2023-10-14 VITALS — BP 120/80 | Ht 66.0 in | Wt 184.0 lb

## 2023-10-14 DIAGNOSIS — K219 Gastro-esophageal reflux disease without esophagitis: Secondary | ICD-10-CM | POA: Diagnosis not present

## 2023-10-14 DIAGNOSIS — Z1211 Encounter for screening for malignant neoplasm of colon: Secondary | ICD-10-CM

## 2023-10-14 MED ORDER — NA SULFATE-K SULFATE-MG SULF 17.5-3.13-1.6 GM/177ML PO SOLN
1.0000 | Freq: Once | ORAL | 0 refills | Status: AC
Start: 2023-10-14 — End: 2023-10-14

## 2023-10-14 NOTE — Patient Instructions (Signed)
 We have sent the following medications to your pharmacy for you to pick up at your convenience: suprep  You have been scheduled for a colonoscopy. Please follow written instructions given to you at your visit today.   If you use inhalers (even only as needed), please bring them with you on the day of your procedure.  DO NOT TAKE 7 DAYS PRIOR TO TEST- Trulicity (dulaglutide) Ozempic, Wegovy (semaglutide) Mounjaro (tirzepatide) Bydureon Bcise (exanatide extended release)  DO NOT TAKE 1 DAY PRIOR TO YOUR TEST Rybelsus (semaglutide) Adlyxin (lixisenatide) Victoza (liraglutide) Byetta (exanatide) ___________________________________________________________________________   _______________________________________________________  If your blood pressure at your visit was 140/90 or greater, please contact your primary care physician to follow up on this.  _______________________________________________________  If you are age 78 or older, your body mass index should be between 23-30. Your Body mass index is 29.7 kg/m. If this is out of the aforementioned range listed, please consider follow up with your Primary Care Provider.  If you are age 69 or younger, your body mass index should be between 19-25. Your Body mass index is 29.7 kg/m. If this is out of the aformentioned range listed, please consider follow up with your Primary Care Provider.   ________________________________________________________  The [facility] providers would like to encourage you to use MYCHART to communicate with providers for non-urgent requests or questions.  Due to long hold times on the telephone, sending your provider a message by [facility] may be a faster and more efficient way to get a response.  Please allow 48 business hours for a response.  Please remember that this is for non-urgent requests.  _______________________________________________________

## 2023-10-14 NOTE — Progress Notes (Signed)
 Chief Complaint: Colonoscopy recall Primary GI MD:Dr. General Kenner (Dr. Grandville Lax)  HPI: Discussed the use of AI scribe software for clinical note transcription with the patient, who gave verbal consent to proceed.  History of Present Illness Dominique Johnston is a 78 year old female who presents for a colonoscopy consultation. She was referred by Dr. Nicolette Barrio for a colonoscopy consultation.  Her last colonoscopy in 2014 was normal. She has no current gastrointestinal symptoms and no family history of colon cancer.  She has a history of hypertension, which is well-controlled, and asthma, which is managed with her current treatment regimen. No history of heart attacks, strokes, or use of blood thinners.  She is currently taking omeprazole  for acid reflux, which is connected to her asthma, and dicyclomine  for irritable bowel symptoms, including alternating diarrhea and constipation. These medications have been effective in managing her symptoms.  She reports a family history of gastrointestinal issues, noting that her grandmother experienced frequent belching. She herself has experienced similar symptoms, which have been managed with her current medications.  Past Medical History:  Diagnosis Date   Allergy    rhinitis   Anxiety    Asthma    Bouchard nodes (DJD hand)    Bronchitis    recurrent   Cataract    COPD (chronic obstructive pulmonary disease) (HCC)    Depression    DJD (degenerative joint disease) of knee    DJD (degenerative joint disease), lumbosacral    GERD (gastroesophageal reflux disease)    Glaucoma    Headache(784.0)    Headache(784.0)    Hyperlipidemia    Hypertension    IBS (irritable bowel syndrome)    Neuromuscular disorder (HCC)    Sinusitis, chronic     Past Surgical History:  Procedure Laterality Date   BREAST EXCISIONAL BIOPSY Left 1998   BREAST SURGERY     CATARACT EXTRACTION Right    04/21/18   EYE SURGERY     FRACTURE SURGERY     ORIF  ANKLE FRACTURE  '07   Left ankle   SEPTOPLASTY  2000   TONSILLECTOMY     transnasal laryngoscopy  2005   TUBAL LIGATION  1977    Current Outpatient Medications  Medication Sig Dispense Refill   albuterol  (PROVENTIL  HFA;VENTOLIN  HFA) 108 (90 Base) MCG/ACT inhaler Inhale 2 puffs into the lungs every 6 (six) hours as needed. 18 g 1   Ascorbic Acid (VITAMIN C PO) Take 1 tablet by mouth daily.     buPROPion  (WELLBUTRIN  XL) 300 MG 24 hr tablet TAKE 1 TABLET(300 MG) BY MOUTH DAILY 90 tablet 1   Cyanocobalamin  (B-12 PO) Take by mouth daily.     diclofenac  Sodium (VOLTAREN ) 1 % GEL Apply 4 g topically 4 (four) times daily. 350 g 0   dicyclomine  (BENTYL ) 20 MG tablet TAKE 1 TABLET BY MOUTH FOUR TIMES DAILY, BEFORE MEALS AND AT BEDTIME 90 tablet 3   diltiazem  (TIAZAC ) 120 MG 24 hr capsule Take 1 capsule (120 mg total) by mouth daily. 90 capsule 3   dorzolamide (TRUSOPT) 2 % ophthalmic solution Place into both eyes 2 (two) times daily.      escitalopram  (LEXAPRO ) 10 MG tablet Take 2 tablets (20 mg total) by mouth daily. 180 tablet 3   fluticasone  (FLONASE ) 50 MCG/ACT nasal spray Place 1 spray into both nostrils daily. 15 mL 2   furosemide  (LASIX ) 40 MG tablet Take 1 tablet (40 mg total) by mouth daily. 90 tablet 3   gabapentin (NEURONTIN) 100 MG  capsule Take 100 mg by mouth at bedtime.     ibuprofen  (ADVIL ) 800 MG tablet TAKE 1 TABLET(800 MG) BY MOUTH DAILY AS NEEDED FOR MODERATE PAIN 90 tablet 1   ketorolac (ACULAR) 0.4 % SOLN 1 drop 4 (four) times daily.     latanoprost  (XALATAN ) 0.005 % ophthalmic solution Place 1 drop into both eyes daily. 2.5 mL 11   levocetirizine (XYZAL) 5 MG tablet Take 2.5 mg by mouth every evening.  6   losartan  (COZAAR ) 100 MG tablet Take 1 tablet (100 mg total) by mouth daily. 90 tablet 3   multivitamin (THERAGRAN) tablet Take 1 tablet by mouth daily.     Na Sulfate-K Sulfate-Mg Sulfate concentrate (SUPREP) 17.5-3.13-1.6 GM/177ML SOLN Take 1 kit (354 mLs total) by mouth  once for 1 dose. 354 mL 0   omeprazole  (PRILOSEC) 40 MG capsule Take 1 capsule (40 mg total) by mouth daily. 90 capsule 3   potassium chloride  (KLOR-CON ) 8 MEQ tablet TAKE 1 TABLET(8 MEQ) BY MOUTH DAILY 90 tablet 3   prednisoLONE acetate (PRED FORTE) 1 % ophthalmic suspension Place 1 drop into the left eye 4 (four) times daily.     WIXELA INHUB 250-50 MCG/ACT AEPB 1 puff 2 (two) times daily.     No current facility-administered medications for this visit.    Allergies as of 10/14/2023 - Review Complete 10/14/2023  Allergen Reaction Noted   Enalapril maleate     Sulfamethoxazole-trimethoprim      Family History  Problem Relation Age of Onset   Arthritis Mother    Varicose Veins Mother    Hypertension Father    Coronary artery disease Father        MI/ Fatal   Heart disease Father    Alcohol abuse Paternal Uncle    Asthma Maternal Uncle    Stroke Paternal Uncle    Breast cancer Neg Hx    Diabetes Neg Hx    Colon cancer Neg Hx     Social History   Socioeconomic History   Marital status: Divorced    Spouse name: Not on file   Number of children: 2   Years of education: 17   Highest education level: Bachelor's degree (e.g., BA, AB, BS)  Occupational History    Employer: [employer]    Comment: retired  Tobacco Use   Smoking status: Never   Smokeless tobacco: Never  Vaping Use   Vaping status: Never Used  Substance and Sexual Activity   Alcohol use: Never   Drug use: Never   Sexual activity: Not Currently    Birth control/protection: I.U.D., Pill, Surgical  Other Topics Concern   Not on file  Social History Narrative   Theatre manager -BS, has had additional course work.  married '71- after 29 years husband left, divorce final in '06.  1 son- '74, 1 daughter '76; 2 grandchildren. 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 Lives in her own home, daughter and grandchild home with her. ACP - HCPOA dtr - tiffany (c) [phone] - [id number].  CPR - ?, no mechanical ventilation except short term care, no prolonged artificial nutrition.    Social Drivers of Health   Financial Resource Strain: Medium Risk (08/14/2023)   Overall Financial Resource Strain (CARDIA)    Difficulty of Paying Living Expenses: Somewhat hard  Food Insecurity: Patient Declined (08/14/2023)   Hunger Vital Sign    Worried About Running Out of Food in the Last Year: Patient declined  Ran Out of Food in the Last Year: Patient declined  Recent Concern: Food Insecurity - Food Insecurity Present (06/04/2023)   Hunger Vital Sign    Worried About Running Out of Food in the Last Year: Sometimes true    Ran Out of Food in the Last Year: Never true  Transportation Needs: No Transportation Needs (08/14/2023)   PRAPARE - Administrator, Civil Service (Medical): No    Lack of Transportation (Non-Medical): No  Physical Activity: Insufficiently Active (08/14/2023)   Exercise Vital Sign    Days of Exercise per Week: 2 days    Minutes of Exercise per Session: 10 min  Stress: Stress Concern Present (08/14/2023)   [employer] of Occupational Health - Occupational Stress Questionnaire    Feeling of Stress : Rather much  Social Connections: Unknown (08/14/2023)   Social Connection and Isolation Panel [NHANES]    Frequency of Communication with Friends and Family: Three times a week    Frequency of Social Gatherings with Friends and Family: Never    Attends Religious Services: Patient declined    Database administrator or Organizations: No    Attends Engineer, structural: Never    Marital Status: Divorced  Recent Concern: Social Connections - Socially Isolated (06/23/2023)   Social Connection and Isolation Panel [NHANES]    Frequency of Communication with Friends and Family: Twice a week    Frequency of Social Gatherings with Friends and Family: More than three times a week    Attends Religious Services: Never    Database administrator or Organizations:  No    Attends Banker Meetings: Never    Marital Status: Divorced  Catering manager Violence: Patient Unable To Answer (06/23/2023)   Humiliation, Afraid, Rape, and Kick questionnaire    Fear of Current or Ex-Partner: Patient unable to answer    Emotionally Abused: Patient unable to answer    Physically Abused: Patient unable to answer    Sexually Abused: Patient unable to answer    Review of Systems:    Constitutional: No weight loss, fever, chills, weakness or fatigue HEENT: Eyes: No change in vision               Ears, Nose, Throat:  No change in hearing or congestion Skin: No rash or itching Cardiovascular: No chest pain, chest pressure or palpitations   Respiratory: No SOB or cough Gastrointestinal: See HPI and otherwise negative Genitourinary: No dysuria or change in urinary frequency Neurological: No headache, dizziness or syncope Musculoskeletal: No new muscle or joint pain Hematologic: No bleeding or bruising Psychiatric: No history of depression or anxiety    Physical Exam:  Vital signs: BP 120/80   Ht 5\' 6"  (1.676 m)   Wt 184 lb (83.5 kg)   BMI 29.70 kg/m   Constitutional: NAD, alert and cooperative Head:  Normocephalic and atraumatic. Eyes:   PEERL, EOMI. No icterus. Conjunctiva pink. Respiratory: Respirations even and unlabored. Lungs clear to auscultation bilaterally.   No wheezes, crackles, or rhonchi.  Cardiovascular:  Regular rate and rhythm. No peripheral edema, cyanosis or pallor.  Gastrointestinal:  Soft, nondistended, nontender. No rebound or guarding. Normal bowel sounds. No appreciable masses or hepatomegaly. Rectal:  Declines Msk:  Symmetrical without gross deformities. Without edema, no deformity or joint abnormality.  Neurologic:  Alert and  oriented x4;  grossly normal neurologically.  Skin:   Dry and intact without significant lesions or rashes. Psychiatric: Oriented to person, place and time. Demonstrates good  judgement and reason  without abnormal affect or behaviors.   RELEVANT LABS AND IMAGING: CBC    Component Value Date/Time   WBC 4.1 08/21/2023 0914   RBC 5.40 (H) 08/21/2023 0914   HGB 12.6 08/21/2023 0914   HCT 40.3 08/21/2023 0914   PLT 219.0 08/21/2023 0914   MCV 74.6 (L) 08/21/2023 0914   MCHC 31.2 08/21/2023 0914   RDW 15.3 08/21/2023 0914   LYMPHSABS 1.7 10/22/2012 1444   MONOABS 0.5 10/22/2012 1444   EOSABS 0.1 10/22/2012 1444   BASOSABS 0.0 10/22/2012 1444    CMP     Component Value Date/Time   NA 142 08/21/2023 0914   K 3.8 08/21/2023 0914   CL 106 08/21/2023 0914   CO2 27 08/21/2023 0914   GLUCOSE 88 08/21/2023 0914   GLUCOSE 96 05/12/2006 0945   BUN 13 08/21/2023 0914   CREATININE 1.16 08/21/2023 0914   CALCIUM 9.0 08/21/2023 0914   PROT 7.3 08/21/2023 0914   ALBUMIN 4.1 08/21/2023 0914   AST 14 08/21/2023 0914   ALT 10 08/21/2023 0914   ALKPHOS 99 08/21/2023 0914   BILITOT 0.4 08/21/2023 0914   GFRNONAA 64.40 07/19/2009 0942   GFRAA 65 07/18/2008 1227     Assessment/Plan:   Colon cancer screening Last colonoscopy in 2014 with Dr. Robinette Chou which was normal and recall 10 years.  No GI issues.  No family history of colon cancer.  Extensive discussion with patient about risks versus benefits about colonoscopies and other screening modalities including FIT test, Cologuard, or observation unless diagnostic symptoms arise. She is of average risk and has no red flag symptoms After much discussion patient decided she would like to pursue colonoscopy while she is of adequate health.  She does appear significantly younger than stated age and does not have many comorbidities  - Schedule colonoscopy with Dr. General Kenner - I thoroughly discussed the procedure with the patient (at bedside) to include nature of the procedure, alternatives, benefits, and risks (including but not limited to bleeding, infection, perforation, anesthesia/cardiac pulmonary complications).  Patient verbalized  understanding and gave verbal consent to proceed with procedure.   GERD Longstanding history of GERD currently well-controlled with omeprazole  daily - Educated patient on lifestyle modifications and provided patient education handout - Continue omeprazole   Charline Hoskinson [facility] 10/14/2023, 3:24 PM  Cc: Adelia Homestead, *

## 2023-10-14 NOTE — Progress Notes (Signed)
 Agree with assessment and plan as outlined.

## 2023-10-22 ENCOUNTER — Other Ambulatory Visit: Payer: Self-pay | Admitting: Internal Medicine

## 2023-11-25 DIAGNOSIS — M19011 Primary osteoarthritis, right shoulder: Secondary | ICD-10-CM | POA: Diagnosis not present

## 2023-11-25 DIAGNOSIS — M19012 Primary osteoarthritis, left shoulder: Secondary | ICD-10-CM | POA: Diagnosis not present

## 2023-11-26 ENCOUNTER — Encounter: Admitting: Gastroenterology

## 2023-11-27 ENCOUNTER — Telehealth: Payer: Self-pay | Admitting: Gastroenterology

## 2023-11-27 NOTE — Telephone Encounter (Signed)
 Inbound call from patient stated she would like to rs her colonoscopy from 7/16 at 10am with Dr.Armbruster since she had to cancel due to not having a care partner to assist her. Rescheduled patient for 9/4 but patient is requesting a call back in regards to prep info.  Patient also stated she was last seen in office 10/14/23 with [facility].  Please advise  Thank you

## 2023-11-27 NOTE — Telephone Encounter (Signed)
 Sent new instructions to the pt via portal. Pt has no further questions at this time.

## 2023-12-02 DIAGNOSIS — H401132 Primary open-angle glaucoma, bilateral, moderate stage: Secondary | ICD-10-CM | POA: Diagnosis not present

## 2023-12-02 DIAGNOSIS — Z961 Presence of intraocular lens: Secondary | ICD-10-CM | POA: Diagnosis not present

## 2023-12-02 DIAGNOSIS — H35353 Cystoid macular degeneration, bilateral: Secondary | ICD-10-CM | POA: Diagnosis not present

## 2023-12-02 DIAGNOSIS — H43813 Vitreous degeneration, bilateral: Secondary | ICD-10-CM | POA: Diagnosis not present

## 2023-12-02 DIAGNOSIS — H35373 Puckering of macula, bilateral: Secondary | ICD-10-CM | POA: Diagnosis not present

## 2023-12-02 DIAGNOSIS — H35033 Hypertensive retinopathy, bilateral: Secondary | ICD-10-CM | POA: Diagnosis not present

## 2024-01-15 ENCOUNTER — Encounter: Admitting: Gastroenterology

## 2024-01-22 ENCOUNTER — Other Ambulatory Visit (HOSPITAL_BASED_OUTPATIENT_CLINIC_OR_DEPARTMENT_OTHER)

## 2024-02-06 ENCOUNTER — Other Ambulatory Visit: Payer: Self-pay | Admitting: Internal Medicine

## 2024-02-16 ENCOUNTER — Other Ambulatory Visit: Payer: PPO

## 2024-02-24 ENCOUNTER — Other Ambulatory Visit

## 2024-03-04 DIAGNOSIS — H35373 Puckering of macula, bilateral: Secondary | ICD-10-CM | POA: Diagnosis not present

## 2024-03-04 DIAGNOSIS — H401231 Low-tension glaucoma, bilateral, mild stage: Secondary | ICD-10-CM | POA: Diagnosis not present

## 2024-03-04 DIAGNOSIS — Z961 Presence of intraocular lens: Secondary | ICD-10-CM | POA: Diagnosis not present

## 2024-03-04 DIAGNOSIS — H26492 Other secondary cataract, left eye: Secondary | ICD-10-CM | POA: Diagnosis not present

## 2024-03-04 DIAGNOSIS — H52203 Unspecified astigmatism, bilateral: Secondary | ICD-10-CM | POA: Diagnosis not present

## 2024-03-04 DIAGNOSIS — H5010 Unspecified exotropia: Secondary | ICD-10-CM | POA: Diagnosis not present

## 2024-05-02 ENCOUNTER — Telehealth

## 2024-05-02 DIAGNOSIS — J208 Acute bronchitis due to other specified organisms: Secondary | ICD-10-CM

## 2024-05-02 DIAGNOSIS — B9689 Other specified bacterial agents as the cause of diseases classified elsewhere: Secondary | ICD-10-CM | POA: Diagnosis not present

## 2024-05-02 MED ORDER — DOXYCYCLINE HYCLATE 100 MG PO TABS: 100.0000 mg | ORAL_TABLET | Freq: Two times a day (BID) | ORAL | 0 refills | Status: DC

## 2024-05-02 MED ORDER — BENZONATATE 100 MG PO CAPS: 100.0000 mg | ORAL_CAPSULE | Freq: Three times a day (TID) | ORAL | 0 refills | Status: AC | PRN

## 2024-05-02 MED ORDER — PREDNISONE 10 MG (21) PO TBPK: ORAL_TABLET | ORAL | 0 refills | Status: DC

## 2024-05-02 NOTE — Patient Instructions (Signed)
 " Dominique Johnston, thank you for joining [name], PA-C for today's virtual visit.  While this provider is not your primary care provider (PCP), if your PCP is located in our provider database this encounter information will be shared with them immediately following your visit.   A [facility] MyChart account gives you access to today's visit and all your visits, tests, and labs performed at [facility]  click here if you don't have a [facility] MyChart account or go to [email or web address]  Consent: (Patient) Dominique Johnston provided verbal consent for this virtual visit at the beginning of the encounter.  Current Medications:  Current Outpatient Medications:    benzonatate  (TESSALON ) 100 MG capsule, Take 1 capsule (100 mg total) by mouth 3 (three) times daily as needed for cough., Disp: 30 capsule, Rfl: 0   doxycycline  (VIBRA -TABS) 100 MG tablet, Take 1 tablet (100 mg total) by mouth 2 (two) times daily., Disp: 14 tablet, Rfl: 0   predniSONE  (STERAPRED UNI-PAK 21 TAB) 10 MG (21) TBPK tablet, Take following package directions, Disp: 21 tablet, Rfl: 0   albuterol  (PROVENTIL  HFA;VENTOLIN  HFA) 108 (90 Base) MCG/ACT inhaler, Inhale 2 puffs into the lungs every 6 (six) hours as needed., Disp: 18 g, Rfl: 1   Ascorbic Acid (VITAMIN C PO), Take 1 tablet by mouth daily., Disp: , Rfl:    buPROPion  (WELLBUTRIN  XL) 300 MG 24 hr tablet, TAKE 1 TABLET(300 MG) BY MOUTH DAILY, Disp: 90 tablet, Rfl: 1   Cyanocobalamin  (B-12 PO), Take by mouth daily., Disp: , Rfl:    diclofenac  Sodium (VOLTAREN ) 1 % GEL, Apply 4 g topically 4 (four) times daily., Disp: 350 g, Rfl: 0   dicyclomine  (BENTYL ) 20 MG tablet, TAKE 1 TABLET BY MOUTH FOUR TIMES DAILY, BEFORE MEALS AND AT BEDTIME, Disp: 90 tablet, Rfl: 3   diltiazem  (TIAZAC ) 120 MG 24 hr capsule, Take 1 capsule (120 mg total) by mouth daily., Disp: 90 capsule, Rfl: 3   dorzolamide (TRUSOPT) 2 % ophthalmic solution, Place into  both eyes 2 (two) times daily. , Disp: , Rfl:    escitalopram  (LEXAPRO ) 10 MG tablet, Take 2 tablets (20 mg total) by mouth daily., Disp: 180 tablet, Rfl: 3   fluticasone  (FLONASE ) 50 MCG/ACT nasal spray, Place 1 spray into both nostrils daily., Disp: 15 mL, Rfl: 2   furosemide  (LASIX ) 40 MG tablet, Take 1 tablet (40 mg total) by mouth daily., Disp: 90 tablet, Rfl: 3   gabapentin (NEURONTIN) 100 MG capsule, Take 100 mg by mouth at bedtime., Disp: , Rfl:    ibuprofen  (ADVIL ) 800 MG tablet, TAKE 1 TABLET(800 MG) BY MOUTH DAILY AS NEEDED FOR MODERATE PAIN, Disp: 90 tablet, Rfl: 1   ketorolac (ACULAR) 0.4 % SOLN, 1 drop 4 (four) times daily., Disp: , Rfl:    latanoprost  (XALATAN ) 0.005 % ophthalmic solution, Place 1 drop into both eyes daily., Disp: 2.5 mL, Rfl: 11   levocetirizine (XYZAL) 5 MG tablet, Take 2.5 mg by mouth every evening., Disp: , Rfl: 6   losartan  (COZAAR ) 100 MG tablet, Take 1 tablet (100 mg total) by mouth daily., Disp: 90 tablet, Rfl: 3   multivitamin (THERAGRAN) tablet, Take 1 tablet by mouth daily., Disp: , Rfl:    omeprazole  (PRILOSEC) 40 MG capsule, TAKE 1 CAPSULE(40 MG) BY MOUTH DAILY, Disp: 90 capsule, Rfl: 3   potassium chloride  (KLOR-CON ) 8 MEQ tablet, TAKE 1 TABLET(8 MEQ) BY MOUTH DAILY, Disp: 90 tablet, Rfl: 3   prednisoLONE acetate (PRED FORTE) 1 %  ophthalmic suspension, Place 1 drop into the left eye 4 (four) times daily., Disp: , Rfl:    WIXELA INHUB 250-50 MCG/ACT AEPB, 1 puff 2 (two) times daily., Disp: , Rfl:    Medications ordered in this encounter:  Meds ordered this encounter  Medications   doxycycline  (VIBRA -TABS) 100 MG tablet    Sig: Take 1 tablet (100 mg total) by mouth 2 (two) times daily.    Dispense:  14 tablet    Refill:  0    Supervising Provider:   LAMPTEY, PHILIP O [[id number]]   benzonatate  (TESSALON ) 100 MG capsule    Sig: Take 1 capsule (100 mg total) by mouth 3 (three) times daily as needed for cough.    Dispense:  30 capsule    Refill:  0     Supervising Provider:   BLAISE ALEENE KIDD [[id number]]   predniSONE  (STERAPRED UNI-PAK 21 TAB) 10 MG (21) TBPK tablet    Sig: Take following package directions    Dispense:  21 tablet    Refill:  0    Supervising Provider:   BLAISE ALEENE KIDD [[id number]]     *If you need refills on other medications prior to your next appointment, please contact your pharmacy*  Follow-Up: Call back or seek an in-person evaluation if the symptoms worsen or if the condition fails to improve as anticipated.  [facility] [phone]  Other Instructions Take antibiotic (Doxycycline ) as directed.  Increase fluids.  Get plenty of rest. Use Mucinex  for congestion. USe the cough medication as directed. If any worsening chest tightness/wheezing, start the steroid pack as directed. Take a daily probiotic (I recommend Align or Culturelle, but even Activia Yogurt may be beneficial).  A humidifier placed in the bedroom may offer some relief for a dry, scratchy throat of nasal irritation.  Read information below on acute bronchitis. If you note any non-resolving, new, or worsening symptoms despite treatment, please seek an in-person evaluation ASAP.   Acute Bronchitis Bronchitis is when the airways that extend from the windpipe into the lungs get red, puffy, and painful (inflamed). Bronchitis often causes thick spit (mucus) to develop. This leads to a cough. A cough is the most common symptom of bronchitis. In acute bronchitis, the condition usually begins suddenly and goes away over time (usually in 2 weeks). Smoking, allergies, and asthma can make bronchitis worse. Repeated episodes of bronchitis may cause more lung problems.  HOME CARE Rest. Drink enough fluids to keep your pee (urine) clear or pale yellow (unless you need to limit fluids as told by your doctor). Only take over-the-counter or prescription medicines as told by your doctor. Avoid smoking and secondhand smoke. These can make bronchitis worse.  If you are a smoker, think about using nicotine gum or skin patches. Quitting smoking will help your lungs heal faster. Reduce the chance of getting bronchitis again by: Washing your hands often. Avoiding people with cold symptoms. Trying not to touch your hands to your mouth, nose, or eyes. Follow up with your doctor as told.  GET HELP IF: Your symptoms do not improve after 1 week of treatment. Symptoms include: Cough. Fever. Coughing up thick spit. Body aches. Chest congestion. Chills. Shortness of breath. Sore throat.  GET HELP RIGHT AWAY IF:  You have an increased fever. You have chills. You have severe shortness of breath. You have bloody thick spit (sputum). You throw up (vomit) often. You lose too much body fluid (dehydration). You have a severe headache. You faint.  MAKE SURE YOU:  Understand these instructions. Will watch your condition. Will get help right away if you are not doing well or get worse. Document Released: 10/16/2007 Document Revised: 12/30/2012 Document Reviewed: 10/20/2012 [facility] Patient Information 2015 [place], [place]. This information is not intended to replace advice given to you by your health care provider. Make sure you discuss any questions you have with your health care provider.    If you have been instructed to have an in-person evaluation today at a local Urgent Care facility, please use the link below. It will take you to a list of all of our available [facility], including address, phone number and hours of operation. Please do not delay care.  [facility]  If you or a family member do not have a primary care provider, use the link below to schedule a visit and establish care. When you choose a [facility] primary care physician or advanced practice provider, you gain a long-term partner in health. Find a Primary Care Provider  Learn more about [facility] in-office and virtual care options: [place]  - Get Care Now  "

## 2024-05-02 NOTE — Progress Notes (Signed)
 " Virtual Visit Consent   My Rinke, you are scheduled for a virtual visit with a [facility] provider today. Just as with appointments in the office, your consent must be obtained to participate. Your consent will be active for this visit and any virtual visit you may have with one of our providers in the next 365 days. If you have a MyChart account, a copy of this consent can be sent to you electronically.  As this is a virtual visit, video technology does not allow for your provider to perform a traditional examination. This may limit your provider's ability to fully assess your condition. If your provider identifies any concerns that need to be evaluated in person or the need to arrange testing (such as labs, EKG, etc.), we will make arrangements to do so. Although advances in technology are sophisticated, we cannot ensure that it will always work on either your end or our end. If the connection with a video visit is poor, the visit may have to be switched to a telephone visit. With either a video or telephone visit, we are not always able to ensure that we have a secure connection.  By engaging in this virtual visit, you consent to the provision of healthcare and authorize for your insurance to be billed (if applicable) for the services provided during this visit. Depending on your insurance coverage, you may receive a charge related to this service.  I need to obtain your verbal consent now. Are you willing to proceed with your visit today? Dominique Johnston has provided verbal consent on 05/02/2024 for a virtual visit (video or telephone). Dominique Johnston, [place]  Date: 05/02/2024 9:25 AM   Virtual Visit via Video Note   I, Dominique Johnston, connected with  Dominique Johnston  ([id number], 1945/05/22) on 05/02/2024 at  9:15 AM EST by a video-enabled telemedicine application and verified that I am speaking with the correct person using two  identifiers.  Location: Patient: Virtual Visit Location Patient: Home Provider: Virtual Visit Location Provider: Home Office   I discussed the limitations of evaluation and management by telemedicine and the availability of in person appointments. The patient expressed understanding and agreed to proceed.    History of Present Illness: Dominique Johnston is a 78 y.o. who identifies as a female who was assigned female at birth, and is being seen today for 2 weeks of nasal congestion, chest congestion and productive cough. Slowly progressing since onset. Over pat 48 hours with borderline fever, increased mucous production and mild fatigue.   HPI: HPI  Problems:  Patient Active Problem List   Diagnosis Date Noted   Left hip pain 03/22/2019   Mild intermittent asthma 10/08/2016   Chronic cough 06/24/2014   Routine health maintenance 10/25/2012   Borderline high cholesterol 01/19/2010   G E REFLUX 12/10/2007   Generalized anxiety disorder 12/04/2007   Essential hypertension 12/01/2006   Allergic rhinitis 12/01/2006   IRRITABLE BOWEL SYNDROME 12/01/2006   Osteoarthritis 12/01/2006    Allergies: Allergies[1] Medications: Current Medications[2]  Observations/Objective: Patient is well-developed, well-nourished in no acute distress.  Resting comfortably at home.  Head is normocephalic, atraumatic.  No labored breathing. Speech is clear and coherent with logical content.  Patient is alert and oriented at baseline.   Assessment and Plan: 1. Acute bacterial bronchitis (Primary) - doxycycline  (VIBRA -TABS) 100 MG tablet; Take 1 tablet (100 mg total) by mouth 2 (two) times daily.  Dispense: 14 tablet; Refill: 0 - benzonatate  (TESSALON ) 100  MG capsule; Take 1 capsule (100 mg total) by mouth 3 (three) times daily as needed for cough.  Dispense: 30 capsule; Refill: 0 - predniSONE  (STERAPRED UNI-PAK 21 TAB) 10 MG (21) TBPK tablet; Take following package directions  Dispense: 21 tablet;  Refill: 0  Rx Doxycycline .  Increase fluids.  Rest.  Saline nasal spray.  Probiotic.  Mucinex  as directed.  Humidifier in bedroom. Tessalon  and Sterapred per orders.  Call or return to clinic if symptoms are not improving.   Follow Up Instructions: I discussed the assessment and treatment plan with the patient. The patient was provided an opportunity to ask questions and all were answered. The patient agreed with the plan and demonstrated an understanding of the instructions.  A copy of instructions were sent to the patient via MyChart unless otherwise noted below.   The patient was advised to call back or seek an in-person evaluation if the symptoms worsen or if the condition fails to improve as anticipated.    Dominique Velma Lunger, PA-C    [1]  Allergies Allergen Reactions   Enalapril Maleate     REACTION: swelling, headache   Sulfamethoxazole-Trimethoprim     Abdominal pain, redness eyes  [2]  Current Outpatient Medications:    benzonatate  (TESSALON ) 100 MG capsule, Take 1 capsule (100 mg total) by mouth 3 (three) times daily as needed for cough., Disp: 30 capsule, Rfl: 0   doxycycline  (VIBRA -TABS) 100 MG tablet, Take 1 tablet (100 mg total) by mouth 2 (two) times daily., Disp: 14 tablet, Rfl: 0   predniSONE  (STERAPRED UNI-PAK 21 TAB) 10 MG (21) TBPK tablet, Take following package directions, Disp: 21 tablet, Rfl: 0   albuterol  (PROVENTIL  HFA;VENTOLIN  HFA) 108 (90 Base) MCG/ACT inhaler, Inhale 2 puffs into the lungs every 6 (six) hours as needed., Disp: 18 g, Rfl: 1   Ascorbic Acid (VITAMIN C PO), Take 1 tablet by mouth daily., Disp: , Rfl:    buPROPion  (WELLBUTRIN  XL) 300 MG 24 hr tablet, TAKE 1 TABLET(300 MG) BY MOUTH DAILY, Disp: 90 tablet, Rfl: 1   Cyanocobalamin  (B-12 PO), Take by mouth daily., Disp: , Rfl:    diclofenac  Sodium (VOLTAREN ) 1 % GEL, Apply 4 g topically 4 (four) times daily., Disp: 350 g, Rfl: 0   dicyclomine  (BENTYL ) 20 MG tablet, TAKE 1 TABLET BY MOUTH FOUR TIMES  DAILY, BEFORE MEALS AND AT BEDTIME, Disp: 90 tablet, Rfl: 3   diltiazem  (TIAZAC ) 120 MG 24 hr capsule, Take 1 capsule (120 mg total) by mouth daily., Disp: 90 capsule, Rfl: 3   dorzolamide (TRUSOPT) 2 % ophthalmic solution, Place into both eyes 2 (two) times daily. , Disp: , Rfl:    escitalopram  (LEXAPRO ) 10 MG tablet, Take 2 tablets (20 mg total) by mouth daily., Disp: 180 tablet, Rfl: 3   fluticasone  (FLONASE ) 50 MCG/ACT nasal spray, Place 1 spray into both nostrils daily., Disp: 15 mL, Rfl: 2   furosemide  (LASIX ) 40 MG tablet, Take 1 tablet (40 mg total) by mouth daily., Disp: 90 tablet, Rfl: 3   gabapentin (NEURONTIN) 100 MG capsule, Take 100 mg by mouth at bedtime., Disp: , Rfl:    ibuprofen  (ADVIL ) 800 MG tablet, TAKE 1 TABLET(800 MG) BY MOUTH DAILY AS NEEDED FOR MODERATE PAIN, Disp: 90 tablet, Rfl: 1   ketorolac (ACULAR) 0.4 % SOLN, 1 drop 4 (four) times daily., Disp: , Rfl:    latanoprost  (XALATAN ) 0.005 % ophthalmic solution, Place 1 drop into both eyes daily., Disp: 2.5 mL, Rfl: 11   levocetirizine (  XYZAL) 5 MG tablet, Take 2.5 mg by mouth every evening., Disp: , Rfl: 6   losartan  (COZAAR ) 100 MG tablet, Take 1 tablet (100 mg total) by mouth daily., Disp: 90 tablet, Rfl: 3   multivitamin (THERAGRAN) tablet, Take 1 tablet by mouth daily., Disp: , Rfl:    omeprazole  (PRILOSEC) 40 MG capsule, TAKE 1 CAPSULE(40 MG) BY MOUTH DAILY, Disp: 90 capsule, Rfl: 3   potassium chloride  (KLOR-CON ) 8 MEQ tablet, TAKE 1 TABLET(8 MEQ) BY MOUTH DAILY, Disp: 90 tablet, Rfl: 3   prednisoLONE acetate (PRED FORTE) 1 % ophthalmic suspension, Place 1 drop into the left eye 4 (four) times daily., Disp: , Rfl:    WIXELA INHUB 250-50 MCG/ACT AEPB, 1 puff 2 (two) times daily., Disp: , Rfl:   "

## 2024-06-04 ENCOUNTER — Encounter (HOSPITAL_COMMUNITY): Payer: Self-pay | Admitting: *Deleted

## 2024-06-04 ENCOUNTER — Emergency Department (HOSPITAL_COMMUNITY)

## 2024-06-04 ENCOUNTER — Ambulatory Visit (HOSPITAL_COMMUNITY)
Admission: EM | Admit: 2024-06-04 | Discharge: 2024-06-04 | Disposition: A | Discharge status: Home / Self Care | Attending: Physician Assistant | Admitting: Physician Assistant

## 2024-06-04 ENCOUNTER — Other Ambulatory Visit: Payer: Self-pay

## 2024-06-04 ENCOUNTER — Encounter (HOSPITAL_COMMUNITY): Payer: Self-pay

## 2024-06-04 ENCOUNTER — Inpatient Hospital Stay (HOSPITAL_COMMUNITY)
Admission: EM | Admit: 2024-06-04 | Discharge: 2024-06-08 | DRG: 308 | Disposition: A | Source: Home / Self Care | Attending: Student | Admitting: Student

## 2024-06-04 DIAGNOSIS — Z825 Family history of asthma and other chronic lower respiratory diseases: Secondary | ICD-10-CM

## 2024-06-04 DIAGNOSIS — Z79899 Other long term (current) drug therapy: Secondary | ICD-10-CM | POA: Diagnosis not present

## 2024-06-04 DIAGNOSIS — Z7901 Long term (current) use of anticoagulants: Secondary | ICD-10-CM | POA: Diagnosis not present

## 2024-06-04 DIAGNOSIS — I272 Pulmonary hypertension, unspecified: Secondary | ICD-10-CM | POA: Diagnosis present

## 2024-06-04 DIAGNOSIS — F418 Other specified anxiety disorders: Secondary | ICD-10-CM | POA: Diagnosis not present

## 2024-06-04 DIAGNOSIS — I10 Essential (primary) hypertension: Secondary | ICD-10-CM | POA: Diagnosis present

## 2024-06-04 DIAGNOSIS — I4891 Unspecified atrial fibrillation: Secondary | ICD-10-CM | POA: Diagnosis present

## 2024-06-04 DIAGNOSIS — Z8249 Family history of ischemic heart disease and other diseases of the circulatory system: Secondary | ICD-10-CM

## 2024-06-04 DIAGNOSIS — Z8261 Family history of arthritis: Secondary | ICD-10-CM | POA: Diagnosis not present

## 2024-06-04 DIAGNOSIS — I2510 Atherosclerotic heart disease of native coronary artery without angina pectoris: Secondary | ICD-10-CM | POA: Diagnosis present

## 2024-06-04 DIAGNOSIS — Z823 Family history of stroke: Secondary | ICD-10-CM | POA: Diagnosis not present

## 2024-06-04 DIAGNOSIS — I4892 Unspecified atrial flutter: Secondary | ICD-10-CM

## 2024-06-04 DIAGNOSIS — J449 Chronic obstructive pulmonary disease, unspecified: Secondary | ICD-10-CM | POA: Diagnosis not present

## 2024-06-04 DIAGNOSIS — F32A Depression, unspecified: Secondary | ICD-10-CM | POA: Diagnosis present

## 2024-06-04 DIAGNOSIS — Z1152 Encounter for screening for COVID-19: Secondary | ICD-10-CM

## 2024-06-04 DIAGNOSIS — E785 Hyperlipidemia, unspecified: Secondary | ICD-10-CM | POA: Diagnosis present

## 2024-06-04 DIAGNOSIS — Z888 Allergy status to other drugs, medicaments and biological substances status: Secondary | ICD-10-CM

## 2024-06-04 DIAGNOSIS — R0602 Shortness of breath: Secondary | ICD-10-CM

## 2024-06-04 DIAGNOSIS — G8929 Other chronic pain: Secondary | ICD-10-CM | POA: Diagnosis present

## 2024-06-04 DIAGNOSIS — J440 Chronic obstructive pulmonary disease with acute lower respiratory infection: Secondary | ICD-10-CM | POA: Diagnosis present

## 2024-06-04 DIAGNOSIS — I480 Paroxysmal atrial fibrillation: Secondary | ICD-10-CM | POA: Diagnosis not present

## 2024-06-04 DIAGNOSIS — J189 Pneumonia, unspecified organism: Secondary | ICD-10-CM | POA: Diagnosis present

## 2024-06-04 LAB — COMPREHENSIVE METABOLIC PANEL WITH GFR
ALT: 11 U/L (ref 0–44)
AST: 21 U/L (ref 15–41)
Albumin: 4 g/dL (ref 3.5–5.0)
Alkaline Phosphatase: 107 U/L (ref 38–126)
Anion gap: 14 (ref 5–15)
BUN: 12 mg/dL (ref 8–23)
CO2: 22 mmol/L (ref 22–32)
Calcium: 9.1 mg/dL (ref 8.9–10.3)
Chloride: 103 mmol/L (ref 98–111)
Creatinine, Ser: 1.23 mg/dL — ABNORMAL HIGH (ref 0.44–1.00)
GFR, Estimated: 45 mL/min — ABNORMAL LOW
Glucose, Bld: 87 mg/dL (ref 70–99)
Potassium: 3.9 mmol/L (ref 3.5–5.1)
Sodium: 139 mmol/L (ref 135–145)
Total Bilirubin: 0.6 mg/dL (ref 0.0–1.2)
Total Protein: 7.4 g/dL (ref 6.5–8.1)

## 2024-06-04 LAB — PROTIME-INR
INR: 1.2 (ref 0.8–1.2)
Prothrombin Time: 15.8 s — ABNORMAL HIGH (ref 11.4–15.2)

## 2024-06-04 LAB — CBC WITH DIFFERENTIAL/PLATELET
Abs Immature Granulocytes: 0.04 K/uL (ref 0.00–0.07)
Basophils Absolute: 0 K/uL (ref 0.0–0.1)
Basophils Relative: 0 %
Eosinophils Absolute: 0.1 K/uL (ref 0.0–0.5)
Eosinophils Relative: 1 %
HCT: 39 % (ref 36.0–46.0)
Hemoglobin: 12.5 g/dL (ref 12.0–15.0)
Immature Granulocytes: 0 %
Lymphocytes Relative: 16 %
Lymphs Abs: 1.6 K/uL (ref 0.7–4.0)
MCH: 23.6 pg — ABNORMAL LOW (ref 26.0–34.0)
MCHC: 32.1 g/dL (ref 30.0–36.0)
MCV: 73.6 fL — ABNORMAL LOW (ref 80.0–100.0)
Monocytes Absolute: 0.6 K/uL (ref 0.1–1.0)
Monocytes Relative: 6 %
Neutro Abs: 7.5 K/uL (ref 1.7–7.7)
Neutrophils Relative %: 77 %
Platelets: 267 K/uL (ref 150–400)
RBC: 5.3 MIL/uL — ABNORMAL HIGH (ref 3.87–5.11)
RDW: 15.8 % — ABNORMAL HIGH (ref 11.5–15.5)
WBC: 9.9 K/uL (ref 4.0–10.5)
nRBC: 0 % (ref 0.0–0.2)

## 2024-06-04 LAB — RESP PANEL BY RT-PCR (RSV, FLU A&B, COVID)  RVPGX2
Influenza A by PCR: NEGATIVE
Influenza B by PCR: NEGATIVE
Resp Syncytial Virus by PCR: NEGATIVE
SARS Coronavirus 2 by RT PCR: NEGATIVE

## 2024-06-04 LAB — I-STAT CG4 LACTIC ACID, ED: Lactic Acid, Venous: 1.3 mmol/L (ref 0.5–1.9)

## 2024-06-04 MED ORDER — DILTIAZEM LOAD VIA INFUSION
10.0000 mg | Freq: Once | INTRAVENOUS | Status: AC
  Administered 2024-06-04: 10 mg via INTRAVENOUS
  Filled 2024-06-04: qty 10

## 2024-06-04 MED ORDER — LACTATED RINGERS IV SOLN: INTRAVENOUS | Status: AC

## 2024-06-04 MED ORDER — IPRATROPIUM-ALBUTEROL 0.5-2.5 (3) MG/3ML IN SOLN: 3.0000 mL | Freq: Once | RESPIRATORY_TRACT | Status: DC

## 2024-06-04 MED ORDER — DILTIAZEM HCL-DEXTROSE 125-5 MG/125ML-% IV SOLN (PREMIX)
5.0000 mg/h | INTRAVENOUS | Status: DC
  Administered 2024-06-04: 5 mg/h via INTRAVENOUS
  Administered 2024-06-05: 15 mg/h via INTRAVENOUS
  Filled 2024-06-04 (×3): qty 125

## 2024-06-04 NOTE — ED Provider Notes (Signed)
 " [place] [facility] Provider Note   CSN: [id number] Arrival date & time: 06/04/24  8167     Patient presents with: Palpitations   Dominique Johnston is a 79 y.o. female.   Patient is a 79 year old female with a history of asthma, hypertension, hyperlipidemia, GERD who is presenting today from urgent care due to multiple issues.  Patient reports for the last week she has had an ongoing cough which is now getting more productive of a thick mucus and noted having a fever last night.  She also noticed yesterday she was feeling very tired and winded when she tried to do anything.  When she woke up today she decided she would go and get this checked out because she was not feeling any better.  She has received a flu and COVID-vaccine.  However when she got to urgent care they told her her heart rate was elevated and an abnormal rhythm and sent her here for further evaluation.  She has not had any chest pain or syncope.  She has not noticed any swelling in her leg.  She has no cardiac history that she is aware of.  No recent medication changes.  She has noticed significant wheezing in the last week but has only been using her inhaler occasionally and denies excessive use of albuterol .  The history is provided by the patient and medical records.  Palpitations      Prior to Admission medications  Medication Sig Start Date End Date Taking? Authorizing Provider  albuterol  (PROVENTIL  HFA;VENTOLIN  HFA) 108 (90 Base) MCG/ACT inhaler Inhale 2 puffs into the lungs every 6 (six) hours as needed. 03/28/16   Rollene Almarie LABOR, MD  Ascorbic Acid (VITAMIN C PO) Take 1 tablet by mouth daily.    [provider]  benzonatate  (TESSALON ) 100 MG capsule Take 1 capsule (100 mg total) by mouth 3 (three) times daily as needed for cough. 05/02/24   Gladis Elsie BROCKS, PA-C  buPROPion  (WELLBUTRIN  XL) 300 MG 24 hr tablet TAKE 1 TABLET(300 MG) BY MOUTH DAILY 09/21/23    Alvia Corean CROME, FNP  Cyanocobalamin  (B-12 PO) Take by mouth daily.    [provider]  diclofenac  Sodium (VOLTAREN ) 1 % GEL Apply 4 g topically 4 (four) times daily. 02/26/20   Burky, Natalie B, NP  dicyclomine  (BENTYL ) 20 MG tablet TAKE 1 TABLET BY MOUTH FOUR TIMES DAILY, BEFORE MEALS AND AT BEDTIME 08/21/23   Rollene Almarie LABOR, MD  diltiazem  (TIAZAC ) 120 MG 24 hr capsule Take 1 capsule (120 mg total) by mouth daily. 08/21/23   Rollene Almarie LABOR, MD  dorzolamide  (TRUSOPT ) 2 % ophthalmic solution Place into both eyes 2 (two) times daily.  01/13/14   [provider]  doxycycline  (VIBRA -TABS) 100 MG tablet Take 1 tablet (100 mg total) by mouth 2 (two) times daily. 05/02/24   Gladis Elsie BROCKS, PA-C  escitalopram  (LEXAPRO ) 10 MG tablet Take 2 tablets (20 mg total) by mouth daily. 08/21/23   Rollene Almarie LABOR, MD  fluticasone  (FLONASE ) 50 MCG/ACT nasal spray Place 1 spray into both nostrils daily. 07/22/20   Graham, Laura E, PA-C  furosemide  (LASIX ) 40 MG tablet Take 1 tablet (40 mg total) by mouth daily. 08/21/23   Rollene Almarie LABOR, MD  gabapentin  (NEURONTIN ) 100 MG capsule Take 100 mg by mouth at bedtime. 10/19/19   Orpha Asberry RAMAN, MD  ibuprofen  (ADVIL ) 800 MG tablet TAKE 1 TABLET(800 MG) BY MOUTH DAILY AS NEEDED FOR MODERATE PAIN  08/21/23   Rollene Almarie LABOR, MD  ketorolac (ACULAR) 0.4 % SOLN 1 drop 4 (four) times daily.    [provider]  latanoprost  (XALATAN ) 0.005 % ophthalmic solution Place 1 drop into both eyes daily. 10/22/12   Norins, Michael E, MD  levocetirizine (XYZAL) 5 MG tablet Take 2.5 mg by mouth every evening. 02/23/18   [provider]  losartan  (COZAAR ) 100 MG tablet Take 1 tablet (100 mg total) by mouth daily. 08/21/23   Rollene Almarie LABOR, MD  multivitamin [facility]) tablet Take 1 tablet by mouth daily.    [provider]  omeprazole  (PRILOSEC) 40 MG capsule TAKE 1 CAPSULE(40 MG) BY MOUTH DAILY 10/23/23    Rollene Almarie LABOR, MD  potassium chloride  (KLOR-CON ) 8 MEQ tablet TAKE 1 TABLET(8 MEQ) BY MOUTH DAILY 08/21/23   Rollene Almarie LABOR, MD  prednisoLONE  acetate (PRED FORTE ) 1 % ophthalmic suspension Place 1 drop into the left eye 4 (four) times daily.    [provider]  predniSONE  (STERAPRED UNI-PAK 21 TAB) 10 MG (21) TBPK tablet Take following package directions 05/02/24   Gladis Elsie BROCKS, PA-C  WIXELA INHUB 250-50 MCG/ACT AEPB 1 puff 2 (two) times daily.    [provider]  diltiazem  (DILACOR XR ) 120 MG 24 hr capsule Take 1 capsule (120 mg total) by mouth daily. 10/22/11 10/11/12  Norins, Michael E, MD    Allergies: Enalapril maleate and Sulfamethoxazole-trimethoprim    Review of Systems  Cardiovascular:  Positive for palpitations.    Updated Vital Signs BP (!) 158/101   Pulse 100   Temp 97.9 F (36.6 C) (Oral)   Resp 19   Ht 5' 6 (1.676 m)   Wt 85.7 kg   SpO2 100%   BMI 30.51 kg/m   Physical Exam Vitals and nursing note reviewed.  Constitutional:      General: She is not in acute distress.    Appearance: She is well-developed.  HENT:     Head: Normocephalic and atraumatic.  Eyes:     Pupils: Pupils are equal, round, and reactive to light.  Cardiovascular:     Rate and Rhythm: Tachycardia present. Rhythm irregularly irregular.     Heart sounds: Normal heart sounds. No murmur heard.    No friction rub.  Pulmonary:     Effort: Pulmonary effort is normal.     Breath sounds: Examination of the right-middle field reveals wheezing. Examination of the left-middle field reveals wheezing. Examination of the right-lower field reveals wheezing. Examination of the left-lower field reveals wheezing. Wheezing present. No rales.  Abdominal:     General: Bowel sounds are normal. There is no distension.     Palpations: Abdomen is soft.     Tenderness: There is no abdominal tenderness. There is no guarding or rebound.  Musculoskeletal:        General: No  tenderness. Normal range of motion.     Cervical back: Normal range of motion and neck supple.     Right lower leg: No edema.     Left lower leg: No edema.     Comments: No edema  Skin:    General: Skin is warm and dry.     Findings: No rash.  Neurological:     Mental Status: She is alert and oriented to person, place, and time. Mental status is at baseline.     Cranial Nerves: No cranial nerve deficit.  Psychiatric:        Behavior: Behavior normal.     (all  labs ordered are listed, but only abnormal results are displayed) Labs Reviewed  COMPREHENSIVE METABOLIC PANEL WITH GFR - Abnormal; Notable for the following components:      Result Value   Creatinine, Ser 1.23 (*)    GFR, Estimated 45 (*)    All other components within normal limits  CBC WITH DIFFERENTIAL/PLATELET - Abnormal; Notable for the following components:   RBC 5.30 (*)    MCV 73.6 (*)    MCH 23.6 (*)    RDW 15.8 (*)    All other components within normal limits  RESP PANEL BY RT-PCR (RSV, FLU A&B, COVID)  RVPGX2  CULTURE, BLOOD (ROUTINE X 2)  CULTURE, BLOOD (ROUTINE X 2)  PROTIME-INR  I-STAT CG4 LACTIC ACID, ED  I-STAT CG4 LACTIC ACID, ED    EKG: EKG Interpretation Date/Time:  Friday June 04 2024 19:24:37 EST Ventricular Rate:  117 PR Interval:    QRS Duration:  97 QT Interval:  375 QTC Calculation: 508 R Axis:   26  Text Interpretation: new  Atrial fibrillation Borderline T wave abnormalities Prolonged QT interval No previous tracing Confirmed by Doretha Folks ([id number]) on 06/04/2024 7:58:43 PM  Radiology: [facility] Chest Port 1 View Result Date: 06/04/2024 CLINICAL DATA:  Possible sepsis palpitation and cough EXAM: PORTABLE CHEST 1 VIEW COMPARISON:  08/21/2023 FINDINGS: Mild cardiomegaly. Streaky atelectasis or mild infiltrate left lung base. Possible trace left effusion. Aortic atherosclerosis. No pneumothorax IMPRESSION: Streaky atelectasis or mild infiltrate at the left lung base with possible trace  left effusion. Electronically Signed   By: Luke Bun M.D.   On: 06/04/2024 19:27     Procedures   Medications Ordered in the ED  lactated ringers  infusion ( Intravenous New Bag/Given 06/04/24 2012)  ipratropium-albuterol  (DUONEB) 0.5-2.5 (3) MG/3ML nebulizer solution 3 mL (has no administration in time range)  diltiazem  (CARDIZEM ) 1 mg/mL load via infusion 10 mg (10 mg Intravenous Bolus from Bag 06/04/24 2148)    And  diltiazem  (CARDIZEM ) 125 mg in dextrose  5% 125 mL (1 mg/mL) infusion (5 mg/hr Intravenous New Bag/Given 06/04/24 2156)                                    Medical Decision Making Amount and/or Complexity of Data Reviewed Labs: ordered. Decision-making details documented in ED Course. Radiology: ordered and independent interpretation performed. Decision-making details documented in ED Course. ECG/medicine tests: ordered and independent interpretation performed. Decision-making details documented in ED Course.  Risk Prescription drug management. Decision regarding hospitalization.   Pt with multiple medical problems and comorbidities and presenting today with a complaint that caries a high risk for morbidity and mortality.  Here today with the above complaints.  Concern for flu, COVID, pneumonia, COPD exacerbation in the setting of also having atrial fibrillation that is new onset.  Patient does not have significant findings of fluid overload and low suspicion for PE.  Low suspicion for ACS.  Patient's heart rate is in the low 100s at this time.  Blood pressures are stable.  Patient given IV fluids due to concern for possible dehydration in the setting of infectious etiology.  Undifferentiated sepsis order set initiated.  Patient does have some distal wheezing and will give DuoNeb. I independently interpreted patient's labs and EKG.  EKG shows atrial fibrillation without old to compare.  CBC within normal limits, CMP without acute findings, lactic acid is normal. I have  independently visualized and interpreted pt's images  today.  Chest x-ray with some mild patchiness in the left base.  Given patient's worsening fever and breathing issues will cover with Rocephin  and azithromycin .  Will treat with a DuoNeb and also treat the atrial fibrillation with Cardizem  for rate control.  Patient is not a candidate for cardioversion as she is not anticoagulated.  Patient heart rate in the low 100s currently on a Cardizem  drip.  Currently sats are 100%.  Will admit for A-fib RVR, pneumonia.  Discussed with patient.  Consulted hospitalist for admission. CRITICAL CARE Performed by: Tawanna Funk Total critical care time: 30 minutes Critical care time was exclusive of separately billable procedures and treating other patients. Critical care was necessary to treat or prevent imminent or life-threatening deterioration. Critical care was time spent personally by me on the following activities: development of treatment plan with patient and/or surrogate as well as nursing, discussions with consultants, evaluation of patient's response to treatment, examination of patient, obtaining history from patient or surrogate, ordering and performing treatments and interventions, ordering and review of laboratory studies, ordering and review of radiographic studies, pulse oximetry and re-evaluation of patient's condition.      Final diagnoses:  Atrial fibrillation with RVR (HCC)  Community acquired pneumonia of left lower lobe of lung    ED Discharge Orders     None          Doretha Folks, MD 06/04/24 2206  "

## 2024-06-04 NOTE — ED Triage Notes (Addendum)
 C/O cough onset approx 1 wk ago; reports hx asthma. C/O productive cough with dark sputum. States started feeling dyspnea over past couple days. States felt feverish last night.  HR noted to be irregular -- pt denies any hx of a-fib or irreg HR.  Denies any chest pain.

## 2024-06-04 NOTE — ED Notes (Signed)
 Lauraine Oas RN at [facility] ED made aware of transport needed to ED

## 2024-06-04 NOTE — ED Provider Notes (Signed)
 I was called into triage to evaluate patient by nursing staff.  Reports a week ago she developed a cough that has been worsening.  Over the past few days she has felt some palpitations with associated dyspnea prompting evaluation today.  During triage she was noted to have tachycardia and on palpation nursing staff noted that this was irregular prompting them to obtain an EKG that showed a flutter.  Patient denies any history of atrial fibrillation or a flutter and is not currently anticoagulated.  She does have a history of asthma and has been using her rescue and maintenance medications more often because of the associated shortness of breath.  Given shortness of breath in setting of new onset a flutter recommended emergency room evaluation to which patient was agreeable.  She was transported by [employer] as she was unaccompanied to this visit.  She was stable at the time of discharge.   Sherrell Rocky POUR, PA-C 06/04/24 1808

## 2024-06-04 NOTE — ED Notes (Signed)
 Patient is being discharged from the Urgent Care and sent to the Emergency Department via [facility] . Per E. Raspet, PA, patient is in need of higher level of care due to new onset atrial flutter with dyspnea; cough. Patient is aware and verbalizes understanding of plan of care.  Vitals:   06/04/24 1737  BP: 135/82  Pulse: (!) 110  Resp: (!) 24  Temp: 98.1 F (36.7 C)  SpO2: 97%

## 2024-06-04 NOTE — ED Notes (Signed)
 Attempted to put [facility] labs on Blood Culture bottles, says it is locked and will not allow me to print. White labels used as of this time for me and RN

## 2024-06-04 NOTE — ED Triage Notes (Signed)
 Pt to ED via [facility] from UC,  Pt initially presented to UC for heart palpitations and cough, was noted to be in new onset A Flutter. Denies CP Denies SHOB . # 18 LAC. No medications given   Last VS : 158/99, 99%hr 89.

## 2024-06-04 NOTE — ED Notes (Signed)
[facility] notified of need for transport to ED. 

## 2024-06-05 DIAGNOSIS — I480 Paroxysmal atrial fibrillation: Secondary | ICD-10-CM | POA: Diagnosis not present

## 2024-06-05 LAB — CBC
HCT: 35.6 % — ABNORMAL LOW (ref 36.0–46.0)
HCT: 35.7 % — ABNORMAL LOW (ref 36.0–46.0)
Hemoglobin: 11.1 g/dL — ABNORMAL LOW (ref 12.0–15.0)
Hemoglobin: 11.1 g/dL — ABNORMAL LOW (ref 12.0–15.0)
MCH: 23.3 pg — ABNORMAL LOW (ref 26.0–34.0)
MCH: 23.7 pg — ABNORMAL LOW (ref 26.0–34.0)
MCHC: 31.2 g/dL (ref 30.0–36.0)
MCHC: 31.1 g/dL (ref 30.0–36.0)
MCV: 74.8 fL — ABNORMAL LOW (ref 80.0–100.0)
MCV: 76.3 fL — ABNORMAL LOW (ref 80.0–100.0)
Platelets: 232 10*3/uL (ref 150–400)
Platelets: 221 10*3/uL (ref 150–400)
RBC: 4.76 MIL/uL (ref 3.87–5.11)
RBC: 4.68 MIL/uL (ref 3.87–5.11)
RDW: 15.6 % — ABNORMAL HIGH (ref 11.5–15.5)
RDW: 16 % — ABNORMAL HIGH (ref 11.5–15.5)
WBC: 7.6 10*3/uL (ref 4.0–10.5)
WBC: 6.8 10*3/uL (ref 4.0–10.5)
nRBC: 0 % (ref 0.0–0.2)
nRBC: 0 % (ref 0.0–0.2)

## 2024-06-05 LAB — IRON AND TIBC
Iron: 41 ug/dL (ref 28–170)
Saturation Ratios: 18 % (ref 10.4–31.8)
TIBC: 231 ug/dL — ABNORMAL LOW (ref 250–450)
UIBC: 190 ug/dL

## 2024-06-05 LAB — BASIC METABOLIC PANEL WITH GFR
Anion gap: 10 (ref 5–15)
BUN: 10 mg/dL (ref 8–23)
CO2: 22 mmol/L (ref 22–32)
Calcium: 8.8 mg/dL — ABNORMAL LOW (ref 8.9–10.3)
Chloride: 107 mmol/L (ref 98–111)
Creatinine, Ser: 1.06 mg/dL — ABNORMAL HIGH (ref 0.44–1.00)
GFR, Estimated: 54 mL/min — ABNORMAL LOW
Glucose, Bld: 97 mg/dL (ref 70–99)
Potassium: 4.1 mmol/L (ref 3.5–5.1)
Sodium: 139 mmol/L (ref 135–145)

## 2024-06-05 LAB — TSH: TSH: 1.46 u[IU]/mL (ref 0.350–4.500)

## 2024-06-05 LAB — CREATININE, SERUM
Creatinine, Ser: 1.11 mg/dL — ABNORMAL HIGH (ref 0.44–1.00)
GFR, Estimated: 51 mL/min — ABNORMAL LOW

## 2024-06-05 LAB — MAGNESIUM: Magnesium: 2 mg/dL (ref 1.7–2.4)

## 2024-06-05 LAB — PHOSPHORUS: Phosphorus: 3.4 mg/dL (ref 2.5–4.6)

## 2024-06-05 LAB — FOLATE: Folate: 12 ng/mL

## 2024-06-05 MED ORDER — LOSARTAN POTASSIUM 50 MG PO TABS
100.0000 mg | ORAL_TABLET | Freq: Every day | ORAL | Status: DC
  Administered 2024-06-05 – 2024-06-08 (×4): 100 mg via ORAL
  Filled 2024-06-05 (×4): qty 2

## 2024-06-05 MED ORDER — DORZOLAMIDE HCL 2 % OP SOLN
1.0000 [drp] | Freq: Two times a day (BID) | OPHTHALMIC | Status: DC
  Administered 2024-06-05 – 2024-06-08 (×7): 1 [drp] via OPHTHALMIC
  Filled 2024-06-05: qty 10

## 2024-06-05 MED ORDER — LATANOPROST 0.005 % OP SOLN
1.0000 [drp] | Freq: Every day | OPHTHALMIC | Status: DC
  Administered 2024-06-05 – 2024-06-07 (×3): 1 [drp] via OPHTHALMIC
  Filled 2024-06-05 (×2): qty 2.5

## 2024-06-05 MED ORDER — ONDANSETRON HCL 4 MG PO TABS: 4.0000 mg | ORAL_TABLET | Freq: Four times a day (QID) | ORAL | Status: DC | PRN

## 2024-06-05 MED ORDER — APIXABAN 5 MG PO TABS
5.0000 mg | ORAL_TABLET | Freq: Two times a day (BID) | ORAL | Status: DC
  Administered 2024-06-05 – 2024-06-08 (×6): 5 mg via ORAL
  Filled 2024-06-05 (×6): qty 1

## 2024-06-05 MED ORDER — ENOXAPARIN SODIUM 40 MG/0.4ML IJ SOSY
40.0000 mg | PREFILLED_SYRINGE | Freq: Every day | INTRAMUSCULAR | Status: DC
  Administered 2024-06-05: 40 mg via SUBCUTANEOUS
  Filled 2024-06-05: qty 0.4

## 2024-06-05 MED ORDER — AZITHROMYCIN 250 MG PO TABS
500.0000 mg | ORAL_TABLET | Freq: Every day | ORAL | Status: DC
  Administered 2024-06-05 – 2024-06-08 (×4): 500 mg via ORAL
  Filled 2024-06-05 (×4): qty 2

## 2024-06-05 MED ORDER — ONDANSETRON HCL 4 MG/2ML IJ SOLN: 4.0000 mg | Freq: Four times a day (QID) | INTRAMUSCULAR | Status: DC | PRN

## 2024-06-05 MED ORDER — SODIUM CHLORIDE 0.9 % IV SOLN
2.0000 g | Freq: Every day | INTRAVENOUS | Status: DC
  Administered 2024-06-05 – 2024-06-07 (×4): 2 g via INTRAVENOUS
  Filled 2024-06-05 (×4): qty 20

## 2024-06-05 MED ORDER — FUROSEMIDE 20 MG PO TABS: 40.0000 mg | ORAL_TABLET | Freq: Every day | ORAL | Status: DC

## 2024-06-05 MED ORDER — PREDNISOLONE ACETATE 1 % OP SUSP: 1.0000 [drp] | Freq: Four times a day (QID) | OPHTHALMIC | Status: DC

## 2024-06-05 MED ORDER — ESCITALOPRAM OXALATE 10 MG PO TABS
20.0000 mg | ORAL_TABLET | Freq: Every day | ORAL | Status: DC
  Administered 2024-06-05 – 2024-06-08 (×4): 20 mg via ORAL
  Filled 2024-06-05 (×4): qty 2

## 2024-06-05 MED ORDER — GABAPENTIN 100 MG PO CAPS: 100.0000 mg | ORAL_CAPSULE | Freq: Every day | ORAL | Status: DC

## 2024-06-05 MED ORDER — ACETAMINOPHEN 650 MG RE SUPP: 650.0000 mg | Freq: Four times a day (QID) | RECTAL | Status: DC | PRN

## 2024-06-05 MED ORDER — BUPROPION HCL ER (XL) 150 MG PO TB24
300.0000 mg | ORAL_TABLET | Freq: Every day | ORAL | Status: DC
  Administered 2024-06-05 – 2024-06-08 (×4): 300 mg via ORAL
  Filled 2024-06-05 (×4): qty 2

## 2024-06-05 MED ORDER — ACETAMINOPHEN 325 MG PO TABS
650.0000 mg | ORAL_TABLET | Freq: Four times a day (QID) | ORAL | Status: DC | PRN
  Administered 2024-06-05 – 2024-06-07 (×2): 650 mg via ORAL
  Filled 2024-06-05 (×2): qty 2

## 2024-06-05 MED ORDER — DILTIAZEM HCL 30 MG PO TABS
30.0000 mg | ORAL_TABLET | Freq: Three times a day (TID) | ORAL | Status: DC
  Administered 2024-06-05 – 2024-06-06 (×4): 30 mg via ORAL
  Filled 2024-06-05 (×5): qty 1

## 2024-06-05 NOTE — Consult Note (Signed)
 "  Cardiology Consultation   Patient ID: Dominique Johnston MRN: [id number]; DOB: [birth year]  Admit date: 06/04/2024 Date of Consult: 06/05/2024  PCP:  Rollene Almarie LABOR, MD   [facility] Providers Cardiologist:  None     Patient Profile: Dominique Johnston is a 79 y.o. female with a hx of HTN, HLD, COPD, depression who is being seen 06/05/2024 for the evaluation of new onset A-fib with RVR at the request of  Dr. Von.  History of Present Illness: Dominique Johnston has never been seen by heart care or any other cardiologist per Care Everywhere review.   Prior to ED visit, she was seen at urgent care for worsening cough and SOB.  She was found to have a fever and found in A-fib then referred to the ER. Presented to ED 1/23 for palpitations. Upon arrival in the ED she was found afebrile and hemodynamically stable. EKG showed A-fib versus a flutter, HR 117 Initial labs include K 3.9, Mg 2, CR 1.23, normal WBC 9.9, normal lactic acid, normal TSH, blood culture pending, negative respiratory panel.  CXR showed atelectasis or mild infiltrates at the left lung base with possible trace left effusion.  Currently being treated for pneumonia. Echo pending. Treated with IV Dilt  load and drip. Currently on 15 mg/hr.   On interview, patient reported episodes of dizziness, fever and SOB for the past week with worsening SOB after walking up a few steps in her home. She notes baseline SOB with hx of asthma, however this episode was more severe. Also note chronic cough with sputum related to COPD> Denies any palpations, chest pain, syncope and edema. She is currently asymptomatic. She takes Losartan  and Diltiazem  at night for BP control, however she did missed yesterday dose, otherwise notes compliance. Denies any tobacco. She is a social drinker. She denies any known history of afib.   Past Medical History:  Diagnosis Date   Allergy    rhinitis   Anxiety    Asthma    Bouchard nodes  (DJD hand)    Bronchitis    recurrent   Cataract    COPD (chronic obstructive pulmonary disease) (HCC)    Depression    DJD (degenerative joint disease) of knee    DJD (degenerative joint disease), lumbosacral    GERD (gastroesophageal reflux disease)    Glaucoma    Headache(784.0)    Headache(784.0)    Hyperlipidemia    Hypertension    IBS (irritable bowel syndrome)    Neuromuscular disorder (HCC)    Sinusitis, chronic     Past Surgical History:  Procedure Laterality Date   BREAST EXCISIONAL BIOPSY Left 1998   BREAST SURGERY     CATARACT EXTRACTION Right    04/21/18   EYE SURGERY     FRACTURE SURGERY     ORIF ANKLE FRACTURE  '07   Left ankle   SEPTOPLASTY  2000   TONSILLECTOMY     transnasal laryngoscopy  2005   TUBAL LIGATION  1977     Home Medications:  Prior to Admission medications  Medication Sig Start Date End Date Taking? Authorizing Provider  albuterol  (PROVENTIL  HFA;VENTOLIN  HFA) 108 (90 Base) MCG/ACT inhaler Inhale 2 puffs into the lungs every 6 (six) hours as needed. 03/28/16  Yes Rollene Almarie LABOR, MD  Ascorbic Acid (VITAMIN C PO) Take 1 tablet by mouth daily.   Yes [provider]  benzonatate  (TESSALON ) 100 MG capsule Take 1 capsule (100 mg total) by mouth 3 (three) times  daily as needed for cough. 05/02/24  Yes Gladis Elsie BROCKS, PA-C  buPROPion  (WELLBUTRIN  XL) 300 MG 24 hr tablet TAKE 1 TABLET(300 MG) BY MOUTH DAILY Patient taking differently: Take 300 mg by mouth at bedtime. 09/21/23  Yes Alvia Corean CROME, FNP  diclofenac  Sodium (VOLTAREN ) 1 % GEL Apply 4 g topically 4 (four) times daily. 02/26/20  Yes Burky, Natalie B, NP  dicyclomine  (BENTYL ) 20 MG tablet TAKE 1 TABLET BY MOUTH FOUR TIMES DAILY, BEFORE MEALS AND AT BEDTIME Patient taking differently: Take 20 mg by mouth daily. 08/21/23  Yes Rollene Almarie LABOR, MD  diltiazem  (TIAZAC ) 120 MG 24 hr capsule Take 1 capsule (120 mg total) by mouth daily. 08/21/23  Yes Rollene Almarie LABOR, MD  dorzolamide  (TRUSOPT ) 2 % ophthalmic solution Place 1 drop into both eyes 2 (two) times daily. 01/13/14  Yes [provider]  escitalopram  (LEXAPRO ) 10 MG tablet Take 2 tablets (20 mg total) by mouth daily. 08/21/23  Yes Rollene Almarie LABOR, MD  fluticasone  (FLONASE ) 50 MCG/ACT nasal spray Place 1 spray into both nostrils daily. Patient taking differently: Place 1 spray into both nostrils daily as needed for allergies or rhinitis. 07/22/20  Yes Graham, Laura E, PA-C  guaiFENesin  (MUCINEX ) 600 MG 12 hr tablet Take 600-1,200 mg by mouth 2 (two) times daily as needed for cough or to loosen phlegm.   Yes [provider]  Ibuprofen -Acetaminophen  (ADVIL  DUAL ACTION) 125-250 MG TABS Take 1-2 tablets by mouth every 6 (six) hours as needed (Pain).   Yes [provider]  ipratropium (ATROVENT ) 0.06 % nasal spray Place 2 sprays into both nostrils 3 (three) times daily as needed for rhinitis.   Yes [provider]  latanoprost  (XALATAN ) 0.005 % ophthalmic solution Place 1 drop into both eyes daily. 10/22/12  Yes Norins, Michael E, MD  levocetirizine (XYZAL) 5 MG tablet Take 2.5 mg by mouth every evening. 02/23/18  Yes [provider]  losartan  (COZAAR ) 100 MG tablet Take 1 tablet (100 mg total) by mouth daily. Patient taking differently: Take 100 mg by mouth at bedtime. 08/21/23  Yes Rollene Almarie LABOR, MD  multivitamin [facility]) tablet Take 1 tablet by mouth daily.   Yes [provider]  omeprazole  (PRILOSEC) 40 MG capsule TAKE 1 CAPSULE(40 MG) BY MOUTH DAILY 10/23/23  Yes Rollene Almarie LABOR, MD  potassium chloride  (KLOR-CON ) 8 MEQ tablet TAKE 1 TABLET(8 MEQ) BY MOUTH DAILY 08/21/23  Yes Rollene Almarie LABOR, MD  WIXELA INHUB 250-50 MCG/ACT AEPB Inhale 1 puff into the lungs 2 (two) times daily.   Yes [provider]  diltiazem  (DILACOR XR ) 120 MG 24 hr capsule Take 1 capsule (120 mg total) by mouth daily. 10/22/11 10/11/12  Norins, Michael E,  MD    Scheduled Meds:  azithromycin   500 mg Oral Daily   buPROPion   300 mg Oral Daily   dorzolamide   1 drop Both Eyes BID   enoxaparin  (LOVENOX ) injection  40 mg Subcutaneous Daily   escitalopram   20 mg Oral Daily   furosemide   40 mg Oral Daily   gabapentin   100 mg Oral QHS   latanoprost   1 drop Both Eyes QHS   losartan   100 mg Oral Daily   prednisoLONE  acetate  1 drop Left Eye QID   Continuous Infusions:  cefTRIAXone  (ROCEPHIN )  IV Stopped (06/05/24 0228)   diltiazem  (CARDIZEM ) infusion 15 mg/hr (06/05/24 0837)   lactated ringers  150 mL/hr at 06/05/24 0837   PRN Meds: acetaminophen  **OR** acetaminophen , ondansetron  **OR** ondansetron  (  ZOFRAN ) IV  Allergies:   Allergies[1]  Social History:   Social History   Socioeconomic History   Marital status: Divorced    Spouse name: Not on file   Number of children: 2   Years of education: 17   Highest education level: Bachelor's degree (e.g., BA, AB, BS)  Occupational History    Employer: [employer]    Comment: retired  Tobacco Use   Smoking status: Never   Smokeless tobacco: Never  Vaping Use   Vaping status: Never Used  Substance and Sexual Activity   Alcohol use: Never   Drug use: Never   Sexual activity: Not on file  Other Topics Concern   Not on file  Social History Narrative   [employer] -BS, has had additional course work.  married '71- after 29 years husband left, divorce final in '06.  1 son- '74, 1 daughter '76; 2 grandchildren. work: [employer]-laid off '10 - currently on benefits and will look for part-time. Lives in her own home, daughter and grandchild home with her. ACP - HCPOA dtr - tiffany (c) 336 - [id number]. CPR - ?, no mechanical ventilation except short term care, no prolonged artificial nutrition.     Family History:   Family History  Problem Relation Age of Onset   Arthritis Mother    Varicose Veins Mother    Hypertension Father    Coronary artery disease Father        MI/ Fatal   Heart  disease Father    Alcohol abuse Paternal Uncle    Asthma Maternal Uncle    Stroke Paternal Uncle    Breast cancer Neg Hx    Diabetes Neg Hx    Colon cancer Neg Hx      ROS:  Please see the history of present illness.  All other ROS reviewed and negative.     Physical Exam/Data: Vitals:   06/05/24 0745 06/05/24 0800 06/05/24 0815 06/05/24 0830  BP: (!) 128/96 113/68 (!) 141/85 125/79  Pulse: 76 82 73 (!) 50  Resp: (!) 31 18 19 20   Temp:      TempSrc:      SpO2: 100% 100% 100% 100%  Weight:      Height:        Intake/Output Summary (Last 24 hours) at 06/05/2024 1000 Last data filed at 06/05/2024 0837 Gross per 24 hour  Intake 1724.36 ml  Output --  Net 1724.36 ml      06/04/2024    6:35 PM 10/14/2023    2:27 PM 08/21/2023    8:45 AM  Last 3 Weights  Weight (lbs) 189 lb 184 lb 187 lb  Weight (kg) 85.73 kg 83.462 kg 84.823 kg     Body mass index is 30.51 kg/m.  General:  Well nourished, well developed, in no acute distress HEENT: normal Neck: no JVD Vascular: No carotid bruits; Distal pulses 2+ bilaterally Cardiac:  normal S1, S2; RRR; no murmur  Lungs:  clear to auscultation bilaterally, no wheezing, rhonchi or rales  Abd: soft, nontender, no hepatomegaly  Ext: no edema Musculoskeletal:  No deformities, BUE and BLE strength normal and equal Skin: warm and dry  Neuro:  CNs 2-12 intact, no focal abnormalities noted Psych:  Normal affect   EKG:  The EKG was personally reviewed and demonstrates:  A-fib versus a flutter, HR 117 in setting of PNA.  Telemetry:  Telemetry was personally reviewed and demonstrates:  afib w/ HR 70-90's   Relevant CV Studies: Echo  pending  Laboratory Data: High Sensitivity Troponin:  No results for input(s): TROPONINIHS in the last 720 hours. No results for input(s): TRNPT in the last 720 hours.    Chemistry Recent Labs  Lab 06/04/24 1920 06/05/24 0230  NA 139  --   K 3.9  --   CL 103  --   CO2 22  --   GLUCOSE 87  --   BUN  12  --   CREATININE 1.23* 1.11*  CALCIUM 9.1  --   GFRNONAA 45* 51*  ANIONGAP 14  --     Recent Labs  Lab 06/04/24 1920  PROT 7.4  ALBUMIN 4.0  AST 21  ALT 11  ALKPHOS 107  BILITOT 0.6   Lipids No results for input(s): CHOL, TRIG, HDL, LABVLDL, LDLCALC, CHOLHDL in the last 168 hours.  Hematology Recent Labs  Lab 06/04/24 1920 06/05/24 0230  WBC 9.9 7.6  RBC 5.30* 4.76  HGB 12.5 11.1*  HCT 39.0 35.6*  MCV 73.6* 74.8*  MCH 23.6* 23.3*  MCHC 32.1 31.2  RDW 15.8* 15.6*  PLT 267 232   Thyroid  No results for input(s): TSH, FREET4 in the last 168 hours.  BNPNo results for input(s): BNP, PROBNP in the last 168 hours.  DDimer No results for input(s): DDIMER in the last 168 hours.  Radiology/Studies:  DG Chest Port 1 View Result Date: 06/04/2024 CLINICAL DATA:  Possible sepsis palpitation and cough EXAM: PORTABLE CHEST 1 VIEW COMPARISON:  08/21/2023 FINDINGS: Mild cardiomegaly. Streaky atelectasis or mild infiltrate left lung base. Possible trace left effusion. Aortic atherosclerosis. No pneumothorax IMPRESSION: Streaky atelectasis or mild infiltrate at the left lung base with possible trace left effusion. Electronically Signed   By: Luke Bun M.D.   On: 06/04/2024 19:27     Assessment and Plan: New onset Afib vs flutter Presented with dizziness and SOB. Currently asymptomatic.  EKG showed A-fib versus a flutter, HR 117 in setting of PNA.  Treated with IV dilt. Currently on 15 mg/hr.  Tele: afib w/ HR 70-90's  Recommend titrate IV dilt down and start PO dilt 30 mg every 6 hours.  Recommend K 4-5 and MG 2-3 in setting of afib.  CHADSVASC 4. Discussed benefit of OAC. Patient is agreeable. Recommend Eliquis  5 mg BID.  Discussed DCCV.  In setting on PNA, would avoid inpatient DCCV and unknown time frame for afib/flutter. Would consider outpatient DCCV in 4 weeks. Patient willing to consider.  Based on echo, can reassess rate control medications.   Suspect 2/2 to PNA.   HTN  BP initially elevated 150-160/90-110's but has improved to 130/80's.  Patient did not take PTA medication last night.  Continue Losartan  100 mg and dilt as above.  Continue to monitor   PNA  Treated with abx  Followed by primary team.    Risk Assessment/Risk Scores:         CHA2DS2-VASc Score = 4   This indicates a 4.8% annual risk of stroke. The patient's score is based upon: CHF History: 0 HTN History: 1 Diabetes History: 0 Stroke History: 0 Vascular Disease History: 0 Age Score: 2 Gender Score: 1       For questions or updates, please contact [facility] Please consult [email or web address] for contact info under      Signed, Lorette CINDERELLA Kapur, PA-C  06/05/2024 10:00 AM      [1]  Allergies Allergen Reactions   Enalapril Maleate     REACTION: swelling, headache   Sulfamethoxazole-Trimethoprim  Abdominal pain, redness eyes   "

## 2024-06-05 NOTE — Plan of Care (Signed)
  Problem: Clinical Measurements: Goal: Will remain free from infection Outcome: Progressing Goal: Diagnostic test results will improve Outcome: Progressing   Problem: Activity: Goal: Risk for activity intolerance will decrease Outcome: Progressing   Problem: Nutrition: Goal: Adequate nutrition will be maintained Outcome: Progressing

## 2024-06-05 NOTE — ED Notes (Signed)
 Pt able to ambulate to restroom without assitance

## 2024-06-05 NOTE — H&P (Signed)
 " History and Physical    Dominique Johnston FMW:[id number] DOB: Apr 11, 1946 DOA: 06/04/2024  PCP: Rollene Almarie LABOR, MD   Chief Complaint:  heart palp  HPI: Dominique Johnston is a 79 y.o. female with medical history significant of COPD, depression, hypertension, hyperlipidemia who presents emergency ferment due to heart palpitations.  Patient went to urgent care due to worsening cough and shortness of breath.  Patient was found to have a fever.  At urgent care she was found to be in A-fib and was referred to the ER for further assessment.  On arrival she was afebrile hemodynamically stable.  Labs were obtained which demonstrated creatinine 1.2 at baseline, WBC 9.9, hemoglobin 12.5, respiratory viral panel was negative, lactic acid 1.3.  Patient had chest x-ray which showed left lung base pneumonia.  Patient was started on diltiazem  and admitted for further workup.   Review of Systems: Review of Systems  Constitutional:  Positive for chills and fever.  Respiratory:  Positive for cough.   All other systems reviewed and are negative.    As per HPI otherwise 10 point review of systems negative.   Allergies[1]  Past Medical History:  Diagnosis Date   Allergy    rhinitis   Anxiety    Asthma    Bouchard nodes (DJD hand)    Bronchitis    recurrent   Cataract    COPD (chronic obstructive pulmonary disease) (HCC)    Depression    DJD (degenerative joint disease) of knee    DJD (degenerative joint disease), lumbosacral    GERD (gastroesophageal reflux disease)    Glaucoma    Headache(784.0)    Headache(784.0)    Hyperlipidemia    Hypertension    IBS (irritable bowel syndrome)    Neuromuscular disorder (HCC)    Sinusitis, chronic     Past Surgical History:  Procedure Laterality Date   BREAST EXCISIONAL BIOPSY Left 1998   BREAST SURGERY     CATARACT EXTRACTION Right    04/21/18   EYE SURGERY     FRACTURE SURGERY     ORIF ANKLE FRACTURE  '07   Left ankle    SEPTOPLASTY  2000   TONSILLECTOMY     transnasal laryngoscopy  2005   TUBAL LIGATION  1977     reports that she has never smoked. She has never used smokeless tobacco. She reports that she does not drink alcohol and does not use drugs.  Family History  Problem Relation Age of Onset   Arthritis Mother    Varicose Veins Mother    Hypertension Father    Coronary artery disease Father        MI/ Fatal   Heart disease Father    Alcohol abuse Paternal Uncle    Asthma Maternal Uncle    Stroke Paternal Uncle    Breast cancer Neg Hx    Diabetes Neg Hx    Colon cancer Neg Hx     Prior to Admission medications  Medication Sig Start Date End Date Taking? Authorizing Provider  albuterol  (PROVENTIL  HFA;VENTOLIN  HFA) 108 (90 Base) MCG/ACT inhaler Inhale 2 puffs into the lungs every 6 (six) hours as needed. 03/28/16   Rollene Almarie LABOR, MD  Ascorbic Acid (VITAMIN C PO) Take 1 tablet by mouth daily.    [provider]  benzonatate  (TESSALON ) 100 MG capsule Take 1 capsule (100 mg total) by mouth 3 (three) times daily as needed for cough. 05/02/24   Gladis Elsie BROCKS, PA-C  buPROPion  (WELLBUTRIN  XL)  300 MG 24 hr tablet TAKE 1 TABLET(300 MG) BY MOUTH DAILY 09/21/23   Alvia Corean CROME, FNP  Cyanocobalamin  (B-12 PO) Take by mouth daily.    [provider]  diclofenac  Sodium (VOLTAREN ) 1 % GEL Apply 4 g topically 4 (four) times daily. 02/26/20   Burky, Natalie B, NP  dicyclomine  (BENTYL ) 20 MG tablet TAKE 1 TABLET BY MOUTH FOUR TIMES DAILY, BEFORE MEALS AND AT BEDTIME 08/21/23   Rollene Almarie LABOR, MD  diltiazem  (TIAZAC ) 120 MG 24 hr capsule Take 1 capsule (120 mg total) by mouth daily. 08/21/23   Rollene Almarie LABOR, MD  dorzolamide  (TRUSOPT ) 2 % ophthalmic solution Place into both eyes 2 (two) times daily.  01/13/14   [provider]  doxycycline  (VIBRA -TABS) 100 MG tablet Take 1 tablet (100 mg total) by mouth 2 (two) times daily. 05/02/24   Gladis Elsie BROCKS, PA-C   escitalopram  (LEXAPRO ) 10 MG tablet Take 2 tablets (20 mg total) by mouth daily. 08/21/23   Rollene Almarie LABOR, MD  fluticasone  (FLONASE ) 50 MCG/ACT nasal spray Place 1 spray into both nostrils daily. 07/22/20   Graham, Laura E, PA-C  furosemide  (LASIX ) 40 MG tablet Take 1 tablet (40 mg total) by mouth daily. 08/21/23   Rollene Almarie LABOR, MD  gabapentin  (NEURONTIN ) 100 MG capsule Take 100 mg by mouth at bedtime. 10/19/19   Orpha Asberry RAMAN, MD  ibuprofen  (ADVIL ) 800 MG tablet TAKE 1 TABLET(800 MG) BY MOUTH DAILY AS NEEDED FOR MODERATE PAIN 08/21/23   Rollene Almarie LABOR, MD  ketorolac (ACULAR) 0.4 % SOLN 1 drop 4 (four) times daily.    [provider]  latanoprost  (XALATAN ) 0.005 % ophthalmic solution Place 1 drop into both eyes daily. 10/22/12   Norins, Michael E, MD  levocetirizine (XYZAL) 5 MG tablet Take 2.5 mg by mouth every evening. 02/23/18   [provider]  losartan  (COZAAR ) 100 MG tablet Take 1 tablet (100 mg total) by mouth daily. 08/21/23   Rollene Almarie LABOR, MD  multivitamin [facility]) tablet Take 1 tablet by mouth daily.    [provider]  omeprazole  (PRILOSEC) 40 MG capsule TAKE 1 CAPSULE(40 MG) BY MOUTH DAILY 10/23/23   Rollene Almarie LABOR, MD  potassium chloride  (KLOR-CON ) 8 MEQ tablet TAKE 1 TABLET(8 MEQ) BY MOUTH DAILY 08/21/23   Rollene Almarie LABOR, MD  prednisoLONE  acetate (PRED FORTE ) 1 % ophthalmic suspension Place 1 drop into the left eye 4 (four) times daily.    [provider]  predniSONE  (STERAPRED UNI-PAK 21 TAB) 10 MG (21) TBPK tablet Take following package directions 05/02/24   Gladis Elsie BROCKS, PA-C  WIXELA INHUB 250-50 MCG/ACT AEPB 1 puff 2 (two) times daily.    [provider]  diltiazem  (DILACOR XR ) 120 MG 24 hr capsule Take 1 capsule (120 mg total) by mouth daily. 10/22/11 10/11/12  Harlow Ozell BRAVO, MD    Physical Exam: Vitals:   06/04/24 2330 06/04/24 2331 06/04/24 2333 06/04/24 2334  BP: (!) 163/90      Pulse: 79 (!) 112 (!) 101 98  Resp: (!) 22 (!) 21 16 (!) 22  Temp:      TempSrc:      SpO2: 99% 100% 100% 100%  Weight:      Height:       Physical Exam Constitutional:      Appearance: She is normal weight.  HENT:     Head: Normocephalic.     Nose: Nose normal.     Mouth/Throat:  Mouth: Mucous membranes are moist.     Pharynx: Oropharynx is clear.  Eyes:     Conjunctiva/sclera: Conjunctivae normal.     Pupils: Pupils are equal, round, and reactive to light.  Cardiovascular:     Rate and Rhythm: Normal rate and regular rhythm.     Pulses: Normal pulses.  Pulmonary:     Effort: Pulmonary effort is normal.  Abdominal:     General: Abdomen is flat. Bowel sounds are normal.  Musculoskeletal:        General: Normal range of motion.  Skin:    General: Skin is warm.     Capillary Refill: Capillary refill takes less than 2 seconds.  Neurological:     General: No focal deficit present.  Psychiatric:        Mood and Affect: Mood normal.        Labs on Admission: I have personally reviewed the patients's labs and imaging studies.  Assessment/Plan Principal Problem:   Atrial fibrillation (HCC)   # A-fib with RVR in setting of bacterial commune acquired pneumonia - Patient present with cough and shortness of breath - Patient found to be tachycardic - In chest  Plan: Continue diltiazem  drip Start ceftriaxone  and azithromycin   # Depression-continue bupropion , Lexapro   # History of CAD, not in exacerbation-continue Lasix   # Chronic pain-continue gabapentin   # Hypertension-continue losartan    Admission status: Inpatient Telemetry  Certification: The appropriate patient status for this patient is INPATIENT. Inpatient status is judged to be reasonable and necessary in order to provide the required intensity of service to ensure the patient's safety. The patient's presenting symptoms, physical exam findings, and initial radiographic and laboratory data in the  context of their chronic comorbidities is felt to place them at high risk for further clinical deterioration. Furthermore, it is not anticipated that the patient will be medically stable for discharge from the hospital within 2 midnights of admission.   * I certify that at the point of admission it is my clinical judgment that the patient will require inpatient hospital care spanning beyond 2 midnights from the point of admission due to high intensity of service, high risk for further deterioration and high frequency of surveillance required.DEWAINE Lamar Dess MD [facility] If 7PM-7AM, please contact night-coverage [email or web address]  06/05/2024, 1:38 AM        [1]  Allergies Allergen Reactions   Enalapril Maleate     REACTION: swelling, headache   Sulfamethoxazole-Trimethoprim     Abdominal pain, redness eyes   "

## 2024-06-05 NOTE — Progress Notes (Signed)
 [facility] Progress Note  Patient: Dominique Johnston    FMW:[id number]  DOA: 06/04/2024     Date of Service: the patient was seen and examined on 06/05/2024  Chief Complaint  Patient presents with   Palpitations   Brief hospital course:  Dominique Johnston is a 79 y.o. female with medical history significant of COPD, depression, hypertension, hyperlipidemia who presents emergency ferment due to heart palpitations.  Patient went to urgent care due to worsening cough and shortness of breath.  Patient was found to have a fever.  At urgent care she was found to be in A-fib and was referred to the ER for further assessment.  On arrival she was afebrile hemodynamically stable.  Labs were obtained which demonstrated creatinine 1.2 at baseline, WBC 9.9, hemoglobin 12.5, respiratory viral panel was negative, lactic acid 1.3.  Patient had chest x-ray which showed left lung base pneumonia.  Patient was started on diltiazem  and admitted for further workup.    Assessment and Plan:   # A-fib with RVR - Patient present with cough and shortness of breath - Patient found to be tachycardic -s/p diltiazem  drip, transition to Cardizem  IR 30 mg p.o. every 8 hourly 1/24 Start on Eliquis  5 mg p.o. twice daily Cardiology consult appreciated  # Community-acquired pneumonia Start ceftriaxone  and azithromycin     # Depression-continue bupropion , Lexapro    # History of CAD, not in exacerbation-continue Lasix    # Chronic pain-continue gabapentin    # Hypertension-continue losartan      Body mass index is 30.51 kg/m.  Interventions:  Diet: Regular diet DVT Prophylaxis: Eliquis  Consults: Cardiology Procedures: None   Advance goals of care discussion: Full code  Family Communication: family was present at bedside, at the time of interview.  The pt provided permission to discuss medical plan with the family. Opportunity was given to ask question and all questions were answered  satisfactorily.   Disposition:  Pt is from home, admitted with atrial fibrillation, still has A-fib, which precludes a safe discharge. Discharge to home, when stable and cleared by cardiology.  Subjective: No significant events overnight.  Patient was admitted last night secondary to pneumonia and found to have AF with RVR. Currently patient is asymptomatic, has some shortness of breath cough with phlegm production. Denies any chest pain or palpitations  Physical Exam: General: NAD, lying comfortably Appear in no distress, affect appropriate Eyes: PERRLA ENT: Oral Mucosa Clear, moist  Neck: no JVD,  Cardiovascular: Irregular rhythm, no Murmur,  Respiratory: good air entry bilaterally, mild bibasilar crackles, no wheezes  Abdomen: Bowel Sound present, Soft and no tenderness,  Skin: no rashes Extremities: no Pedal edema, no calf tenderness Neurologic: without any new focal findings Gait not checked due to patient safety concerns  Vitals:   06/05/24 1015 06/05/24 1102 06/05/24 1319 06/05/24 1356  BP: 116/72  134/82 (!) 140/91  Pulse: 74  61 82  Resp: 17  19 16   Temp:  98.7 F (37.1 C)  97.9 F (36.6 C)  TempSrc:  Oral  Oral  SpO2: 100%  99% 99%  Weight:      Height:        Intake/Output Summary (Last 24 hours) at 06/05/2024 1644 Last data filed at 06/05/2024 9162 Gross per 24 hour  Intake 1724.36 ml  Output --  Net 1724.36 ml   Filed Weights   06/04/24 1835  Weight: 85.7 kg    Data Reviewed: I have personally reviewed and interpreted daily labs, tele strips, imagings as discussed above. I  reviewed all nursing notes, pharmacy notes, vitals, pertinent old records I have discussed plan of care as described above with RN and patient/family.  CBC: Recent Labs  Lab 06/04/24 1920 06/05/24 0230 06/05/24 1049  WBC 9.9 7.6 6.8  NEUTROABS 7.5  --   --   HGB 12.5 11.1* 11.1*  HCT 39.0 35.6* 35.7*  MCV 73.6* 74.8* 76.3*  PLT 267 232 221   Basic Metabolic  Panel: Recent Labs  Lab 06/04/24 1920 06/05/24 0230 06/05/24 1049  NA 139  --  139  K 3.9  --  4.1  CL 103  --  107  CO2 22  --  22  GLUCOSE 87  --  97  BUN 12  --  10  CREATININE 1.23* 1.11* 1.06*  CALCIUM 9.1  --  8.8*  MG  --   --  2.0  PHOS  --   --  3.4    Studies: DG Chest Port 1 View Result Date: 06/04/2024 CLINICAL DATA:  Possible sepsis palpitation and cough EXAM: PORTABLE CHEST 1 VIEW COMPARISON:  08/21/2023 FINDINGS: Mild cardiomegaly. Streaky atelectasis or mild infiltrate left lung base. Possible trace left effusion. Aortic atherosclerosis. No pneumothorax IMPRESSION: Streaky atelectasis or mild infiltrate at the left lung base with possible trace left effusion. Electronically Signed   By: Luke Bun M.D.   On: 06/04/2024 19:27    Scheduled Meds:  apixaban   5 mg Oral BID   azithromycin   500 mg Oral Daily   buPROPion   300 mg Oral Daily   diltiazem   30 mg Oral Q8H   dorzolamide   1 drop Both Eyes BID   escitalopram   20 mg Oral Daily   latanoprost   1 drop Both Eyes QHS   losartan   100 mg Oral Daily   Continuous Infusions:  cefTRIAXone  (ROCEPHIN )  IV Stopped (06/05/24 0228)   PRN Meds: acetaminophen  **OR** acetaminophen , ondansetron  **OR** ondansetron  (ZOFRAN ) IV  Time spent: 55 minutes  Author: ELVAN SOR. MD [facility] 06/05/2024 4:44 PM  To reach On-call, see care teams to locate the attending and reach out to them via [email or web address]. If 7PM-7AM, please contact night-coverage If you still have difficulty reaching the attending provider, please page the [facility] (Director on Call) for [facility] on amion for assistance.

## 2024-06-06 ENCOUNTER — Inpatient Hospital Stay (HOSPITAL_COMMUNITY)

## 2024-06-06 DIAGNOSIS — I4891 Unspecified atrial fibrillation: Secondary | ICD-10-CM

## 2024-06-06 DIAGNOSIS — I480 Paroxysmal atrial fibrillation: Secondary | ICD-10-CM | POA: Diagnosis not present

## 2024-06-06 LAB — ECHOCARDIOGRAM COMPLETE
AR max vel: 2.62 cm2
AV Area VTI: 3.09 cm2
AV Area mean vel: 2.67 cm2
AV Mean grad: 2 mmHg
AV Peak grad: 4.4 mmHg
Ao pk vel: 1.05 m/s
Calc EF: 72.7 %
Height: 66 in
S' Lateral: 3.7 cm
Single Plane A2C EF: 71.8 %
Single Plane A4C EF: 70.9 %
Weight: 3024 [oz_av]

## 2024-06-06 MED ORDER — DILTIAZEM HCL ER COATED BEADS 120 MG PO CP24
120.0000 mg | ORAL_CAPSULE | Freq: Every day | ORAL | Status: DC
  Administered 2024-06-07 – 2024-06-08 (×2): 120 mg via ORAL
  Filled 2024-06-06 (×2): qty 1

## 2024-06-06 MED ORDER — DILTIAZEM HCL 30 MG PO TABS
30.0000 mg | ORAL_TABLET | Freq: Three times a day (TID) | ORAL | Status: AC
  Administered 2024-06-06: 30 mg via ORAL
  Filled 2024-06-06: qty 1

## 2024-06-06 MED ORDER — GUAIFENESIN-DM 100-10 MG/5ML PO SYRP: 10.0000 mL | ORAL_SOLUTION | Freq: Four times a day (QID) | ORAL | Status: DC | PRN

## 2024-06-06 MED ORDER — GUAIFENESIN ER 600 MG PO TB12
600.0000 mg | ORAL_TABLET | Freq: Two times a day (BID) | ORAL | Status: DC
  Administered 2024-06-06 – 2024-06-08 (×4): 600 mg via ORAL
  Filled 2024-06-06 (×4): qty 1

## 2024-06-06 MED ORDER — LORATADINE 10 MG PO TABS
10.0000 mg | ORAL_TABLET | Freq: Every day | ORAL | Status: DC
  Administered 2024-06-06 – 2024-06-08 (×3): 10 mg via ORAL
  Filled 2024-06-06 (×3): qty 1

## 2024-06-06 NOTE — Care Management (Signed)
 SDOH resources added to AVS

## 2024-06-06 NOTE — Discharge Instructions (Signed)

## 2024-06-06 NOTE — H&P (View-Only) (Signed)
"  °  Progress Note  Patient Name: Dominique Johnston Date of Encounter: 06/06/2024 [facility] Cardiologist: None   Interval Summary   Feels well today and denies any palpitations or dyspnea.   Vital Signs Vitals:   06/05/24 1955 06/05/24 2307 06/06/24 0524 06/06/24 0545  BP: (!) 126/91 110/83 (!) 152/116 (!) 159/98  Pulse: 82 87 89 99  Resp: 18 16 18    Temp: 99 F (37.2 C) 98 F (36.7 C) 98.5 F (36.9 C)   TempSrc: Oral Oral Oral   SpO2: 99% 99% 95%   Weight:      Height:        Intake/Output Summary (Last 24 hours) at 06/06/2024 0555 Last data filed at 06/06/2024 0319 Gross per 24 hour  Intake 2206.05 ml  Output --  Net 2206.05 ml      06/04/2024    6:35 PM 10/14/2023    2:27 PM 08/21/2023    8:45 AM  Last 3 Weights  Weight (lbs) 189 lb 184 lb 187 lb  Weight (kg) 85.73 kg 83.462 kg 84.823 kg      Telemetry/ECG  Rate controlled Afib - Personally Reviewed  Physical Exam  GEN: No acute distress.   Neck: No JVD Cardiac: irregularly irregular, no murmurs, rubs, or gallops.  Respiratory: Clear to auscultation bilaterally. GI: Soft, nontender, non-distended  Dominique: No edema  Assessment & Plan  Dominique Johnston is a 79 year old female with history of COPD who presented with shortness of breath and fever and found to have community-acquired pneumonia and was in A-fib with RVR.  She was started on Dilt drip and antibiotics.  She denies any history of A-fib but has been on Dilt for blood pressure.  Her EKG showed a flutter with variable block, but telemetry in the room actually showed rate controlled A-fib.    #Afib/AFL w/ RVR #CAP - Asymptomatic and rate controlled - Transition Dilt to home ER Dilt 120 - Follow up with TTE; if EF normal, can add metop 25 TID - Cont Eliquis  - If still in Afib in AM, can pursue TEE DCCV - Will continue to follow.   For questions or updates, please contact [facility] Please consult [email or web address] for contact info under    Informed Consent   Shared Decision Making/Informed Consent   The risks [stroke, cardiac arrhythmias rarely resulting in the need for a temporary or permanent pacemaker, skin irritation or burns, esophageal damage, perforation (1:10,000 risk), bleeding, pharyngeal hematoma as well as other potential complications associated with conscious sedation including aspiration, arrhythmia, respiratory failure and death], benefits (treatment guidance, restoration of normal sinus rhythm, diagnostic support) and alternatives of a transesophageal echocardiogram guided cardioversion were discussed in detail with Dominique Johnston and she is willing to proceed.      Signed, Joelle VEAR Ren Donley, MD  "

## 2024-06-06 NOTE — Progress Notes (Signed)
" ° ° °  Messaged [facility] hospital administrators to request the patient be added for a TEE/DCCV tomorrow (06/07/2024) if still in Afib per Dr. Deneise. Orders have been submitted and are on hold. The NPO order has been released.   Signed, Lorette CINDERELLA Kapur, PA-C 06/06/2024, 10:43 AM Pager: [phone]  "

## 2024-06-06 NOTE — Progress Notes (Signed)
"  °  Progress Note  Patient Name: Dominique Johnston Date of Encounter: 06/06/2024 [facility] Cardiologist: None   Interval Summary   Feels well today and denies any palpitations or dyspnea.   Vital Signs Vitals:   06/05/24 1955 06/05/24 2307 06/06/24 0524 06/06/24 0545  BP: (!) 126/91 110/83 (!) 152/116 (!) 159/98  Pulse: 82 87 89 99  Resp: 18 16 18    Temp: 99 F (37.2 C) 98 F (36.7 C) 98.5 F (36.9 C)   TempSrc: Oral Oral Oral   SpO2: 99% 99% 95%   Weight:      Height:        Intake/Output Summary (Last 24 hours) at 06/06/2024 0555 Last data filed at 06/06/2024 0319 Gross per 24 hour  Intake 2206.05 ml  Output --  Net 2206.05 ml      06/04/2024    6:35 PM 10/14/2023    2:27 PM 08/21/2023    8:45 AM  Last 3 Weights  Weight (lbs) 189 lb 184 lb 187 lb  Weight (kg) 85.73 kg 83.462 kg 84.823 kg      Telemetry/ECG  Rate controlled Afib - Personally Reviewed  Physical Exam  GEN: No acute distress.   Neck: No JVD Cardiac: irregularly irregular, no murmurs, rubs, or gallops.  Respiratory: Clear to auscultation bilaterally. GI: Soft, nontender, non-distended  Dominique: No edema  Assessment & Plan  Dominique Johnston is a 79 year old female with history of COPD who presented with shortness of breath and fever and found to have community-acquired pneumonia and was in A-fib with RVR.  She was started on Dilt drip and antibiotics.  She denies any history of A-fib but has been on Dilt for blood pressure.  Her EKG showed a flutter with variable block, but telemetry in the room actually showed rate controlled A-fib.    #Afib/AFL w/ RVR #CAP - Asymptomatic and rate controlled - Transition Dilt to home ER Dilt 120 - Follow up with TTE; if EF normal, can add metop 25 TID - Cont Eliquis  - If still in Afib in AM, can pursue TEE DCCV - Will continue to follow.   For questions or updates, please contact [facility] Please consult [email or web address] for contact info under    Informed Consent   Shared Decision Making/Informed Consent   The risks [stroke, cardiac arrhythmias rarely resulting in the need for a temporary or permanent pacemaker, skin irritation or burns, esophageal damage, perforation (1:10,000 risk), bleeding, pharyngeal hematoma as well as other potential complications associated with conscious sedation including aspiration, arrhythmia, respiratory failure and death], benefits (treatment guidance, restoration of normal sinus rhythm, diagnostic support) and alternatives of a transesophageal echocardiogram guided cardioversion were discussed in detail with Dominique Johnston and she is willing to proceed.      Signed, Joelle VEAR Ren Donley, MD  "

## 2024-06-06 NOTE — Progress Notes (Signed)
 [facility] Progress Note  Patient: Dominique Johnston    FMW:[id number]  DOA: 06/04/2024     Date of Service: the patient was seen and examined on 06/06/2024  Chief Complaint  Patient presents with   Palpitations   Brief Johnston course: From H&P Dominique Johnston is a 79 y.o. female with medical history significant of COPD, depression, hypertension, hyperlipidemia who presents emergency ferment due to heart palpitations.  Patient went to urgent care due to worsening cough and shortness of breath.  Patient was found to have a fever.  At urgent care she was found to be in A-fib and was referred to the ER for further assessment.  On arrival she was afebrile hemodynamically stable.  Labs were obtained which demonstrated creatinine 1.2 at baseline, WBC 9.9, hemoglobin 12.5, respiratory viral panel was negative, lactic acid 1.3.  Patient had chest x-ray which showed left lung base pneumonia.  Patient was started on diltiazem  and admitted for further workup.    Assessment and Plan:   # A-fib with RVR, new onset - Patient present with cough and shortness of breath - Patient found to be tachycardic -s/p diltiazem  drip, transition to Cardizem  IR 30 mg p.o. every 8 hourly 1/24 Started Eliquis  5 mg p.o. twice daily TTE LVEF 70 to 75%, no WMA, no significant valvular abnormality. Cardiology consulted, rec to start Cardizem  CD 120 mg p.o. daily home dose and if patient remains in AF with RVR then patient may need cardioversion tomorrow a.m. Keep n.p.o. after midnight   # Community-acquired pneumonia - Ceftriaxone  and azithromycin  -Mucinex  600 mg p.o. twice daily -Robitussin DM as needed -Claritin  10 mg p.o. daily due to URI symptoms  # Depression-continue bupropion , Lexapro     # Chronic pain-continue gabapentin    # Hypertension-continue losartan      Body mass index is 30.51 kg/m.  Interventions:  Diet: Regular diet DVT Prophylaxis: Eliquis  Consults:  Cardiology Procedures: None   Advance goals of care discussion: Full code  Family Communication: family was not present at bedside, at the time of interview.  The pt provided permission to discuss medical plan with the family. Opportunity was given to ask question and all questions were answered satisfactorily.   Disposition:  Pt is from home, admitted with atrial fibrillation, still has A-fib, which precludes a safe discharge. Discharge to home, when stable and cleared by cardiology.  Subjective: No significant events overnight.  Patient feels improvement in shortness of breath, still has mild cough with phlegm production, runny nose, feels like her sinuses are draining, denies any chest pain or palpitation, no any other complaints.   Physical Exam: General: NAD, lying comfortably Appear in no distress, affect appropriate Eyes: PERRLA ENT: Oral Mucosa Clear, moist  Neck: no JVD,  Cardiovascular: Irregular rhythm, no Murmur,  Respiratory: good air entry bilaterally, mild bibasilar crackles, no wheezes  Abdomen: Bowel Sound present, Soft and no tenderness,  Skin: no rashes Extremities: no Pedal edema, no calf tenderness Neurologic: without any new focal findings Gait not checked due to patient safety concerns  Vitals:   06/06/24 0524 06/06/24 0545 06/06/24 0822 06/06/24 1238  BP: (!) 152/116 (!) 159/98 (!) 144/105 (!) 133/103  Pulse: 89 99    Resp: 18  20   Temp: 98.5 F (36.9 C)  98 F (36.7 C) 98.4 F (36.9 C)  TempSrc: Oral  Oral Oral  SpO2: 95%     Weight:      Height:        Intake/Output Summary (Last 24  hours) at 06/06/2024 1506 Last data filed at 06/06/2024 0319 Gross per 24 hour  Intake 916.94 ml  Output --  Net 916.94 ml   Filed Weights   06/04/24 1835  Weight: 85.7 kg    Data Reviewed: I have personally reviewed and interpreted daily labs, tele strips, imagings as discussed above. I reviewed all nursing notes, pharmacy notes, vitals, pertinent old  records I have discussed plan of care as described above with RN and patient/family.  CBC: Recent Labs  Lab 06/04/24 1920 06/05/24 0230 06/05/24 1049  WBC 9.9 7.6 6.8  NEUTROABS 7.5  --   --   HGB 12.5 11.1* 11.1*  HCT 39.0 35.6* 35.7*  MCV 73.6* 74.8* 76.3*  PLT 267 232 221   Basic Metabolic Panel: Recent Labs  Lab 06/04/24 1920 06/05/24 0230 06/05/24 1049  NA 139  --  139  K 3.9  --  4.1  CL 103  --  107  CO2 22  --  22  GLUCOSE 87  --  97  BUN 12  --  10  CREATININE 1.23* 1.11* 1.06*  CALCIUM 9.1  --  8.8*  MG  --   --  2.0  PHOS  --   --  3.4    Studies: ECHOCARDIOGRAM COMPLETE Result Date: 06/06/2024    ECHOCARDIOGRAM REPORT   Patient Name:   Dominique Johnston Date of Exam: 06/06/2024 Medical Rec #:  [id number]               Height:       66.0 in Accession #:    [id number]              Weight:       189.0 lb Date of Birth:  [birth year]               BSA:          1.952 m Patient Age:    78 years                BP:           159/98 mmHg Patient Gender: F                       HR:           126 bpm. Exam Location:  Inpatient Procedure: 2D Echo, Cardiac Doppler and Color Doppler (Both Spectral and Color            Flow Doppler were utilized during procedure). Indications:    AFIB I48.91  History:        Patient has no prior history of Echocardiogram examinations.                 Arrythmias:Atrial Fibrillation.  Sonographer:    Nathanel Devonshire Referring Phys: [id number] ELVAN SOR IMPRESSIONS  1. Left ventricular ejection fraction, by estimation, is 70 to 75%. Left ventricular ejection fraction by 2D MOD biplane is 72.7 %. The left ventricle has hyperdynamic function. The left ventricle has no regional wall motion abnormalities. There is mild  concentric left ventricular hypertrophy. Left ventricular diastolic function could not be evaluated.  2. Right ventricular systolic function is normal. The right ventricular size is normal. There is mildly elevated pulmonary artery systolic  pressure. The estimated right ventricular systolic pressure is 44.0 mmHg.  3. The mitral valve is normal in structure. No evidence of mitral valve regurgitation. No evidence of mitral stenosis.  4. Tricuspid valve regurgitation is  mild to moderate.  5. The aortic valve is normal in structure. Aortic valve regurgitation is not visualized. No aortic stenosis is present.  6. The inferior vena cava is normal in size with greater than 50% respiratory variability, suggesting right atrial pressure of 3 mmHg. Comparison(s): No prior Echocardiogram. FINDINGS  Left Ventricle: Left ventricular ejection fraction, by estimation, is 70 to 75%. Left ventricular ejection fraction by 2D MOD biplane is 72.7 %. The left ventricle has hyperdynamic function. The left ventricle has no regional wall motion abnormalities. The left ventricular internal cavity size was normal in size. There is mild concentric left ventricular hypertrophy. Left ventricular diastolic function could not be evaluated due to atrial fibrillation. Left ventricular diastolic function could not be evaluated. Right Ventricle: The right ventricular size is normal. No increase in right ventricular wall thickness. Right ventricular systolic function is normal. There is mildly elevated pulmonary artery systolic pressure. The tricuspid regurgitant velocity is 3.20  m/s, and with an assumed right atrial pressure of 3 mmHg, the estimated right ventricular systolic pressure is 44.0 mmHg. Left Atrium: Left atrial size was normal in size. Right Atrium: Right atrial size was normal in size. Pericardium: There is no evidence of pericardial effusion. Mitral Valve: The mitral valve is normal in structure. No evidence of mitral valve regurgitation. No evidence of mitral valve stenosis. Tricuspid Valve: The tricuspid valve is normal in structure. Tricuspid valve regurgitation is mild to moderate. No evidence of tricuspid stenosis. Aortic Valve: The aortic valve is normal in  structure. Aortic valve regurgitation is not visualized. No aortic stenosis is present. Aortic valve mean gradient measures 2.0 mmHg. Aortic valve peak gradient measures 4.4 mmHg. Aortic valve area, by VTI measures 3.09 cm. Pulmonic Valve: The pulmonic valve was normal in structure. Pulmonic valve regurgitation is not visualized. No evidence of pulmonic stenosis. Aorta: The aortic root is normal in size and structure. Venous: The inferior vena cava is normal in size with greater than 50% respiratory variability, suggesting right atrial pressure of 3 mmHg. IAS/Shunts: No atrial level shunt detected by color flow Doppler.  LEFT VENTRICLE PLAX 2D                        Biplane EF (MOD) LVIDd:         5.80 cm         LV Biplane EF:   Left LVIDs:         3.70 cm                          ventricular LV PW:         1.00 cm                          ejection LV IVS:        1.10 cm                          fraction by LVOT diam:     1.99 cm                          2D MOD LV SV:         51  biplane is LV SV Index:   26                               72.7 %. LVOT Area:     3.11 cm                                Diastology                                LV e' medial:   12.30 cm/s LV Volumes (MOD)               LV E/e' medial: 6.7 LV vol d, MOD    41.9 ml A2C: LV vol d, MOD    52.5 ml A4C: LV vol s, MOD    11.8 ml A2C: LV vol s, MOD    15.3 ml A4C: LV SV MOD A2C:   30.1 ml LV SV MOD A4C:   52.5 ml LV SV MOD BP:    35.8 ml RIGHT VENTRICLE          IVC RV Basal diam:  2.85 cm  IVC diam: 1.83 cm TAPSE (M-mode): 1.7 cm LEFT ATRIUM             Index        RIGHT ATRIUM           Index LA diam:        3.36 cm 1.72 cm/m   RA Area:     12.90 cm LA Vol (A2C):   42.5 ml 21.77 ml/m  RA Volume:   32.90 ml  16.85 ml/m LA Vol (A4C):   38.6 ml 19.77 ml/m LA Biplane Vol: 43.7 ml 22.38 ml/m  AORTIC VALVE                    PULMONIC VALVE AV Area (Vmax):    2.62 cm     PV Vmax:       1.02 m/s AV Area (Vmean):    2.67 cm     PV Peak grad:  4.2 mmHg AV Area (VTI):     3.09 cm AV Vmax:           105.00 cm/s AV Vmean:          70.700 cm/s AV VTI:            0.166 m AV Peak Grad:      4.4 mmHg AV Mean Grad:      2.0 mmHg LVOT Vmax:         88.60 cm/s LVOT Vmean:        60.800 cm/s LVOT VTI:          0.165 m LVOT/AV VTI ratio: 0.99  AORTA Ao Root diam: 3.22 cm Ao Asc diam:  2.83 cm MV E velocity: 82.30 cm/s  TRICUSPID VALVE                            TR Peak grad:   41.0 mmHg                            TR Vmax:        320.00 cm/s  SHUNTS                            Systemic VTI:  0.16 m                            Systemic Diam: 1.99 cm Joelle Cedars Tonleu Electronically signed by Joelle Cedars Tonleu Signature Date/Time: 06/06/2024/11:26:36 AM    Final     Scheduled Meds:  apixaban   5 mg Oral BID   azithromycin   500 mg Oral Daily   buPROPion   300 mg Oral Daily   diltiazem   30 mg Oral Q8H   dorzolamide   1 drop Both Eyes BID   escitalopram   20 mg Oral Daily   latanoprost   1 drop Both Eyes QHS   loratadine   10 mg Oral Daily   losartan   100 mg Oral Daily   Continuous Infusions:  cefTRIAXone  (ROCEPHIN )  IV Stopped (06/05/24 2204)   PRN Meds: acetaminophen  **OR** acetaminophen , ondansetron  **OR** ondansetron  (ZOFRAN ) IV  Time spent: 40 minutes  Author: ELVAN SOR. MD [facility] 06/06/2024 3:06 PM  To reach On-call, see care teams to locate the attending and reach out to them via [email or web address]. If 7PM-7AM, please contact night-coverage If you still have difficulty reaching the attending provider, please page the [facility] (Director on Call) for [facility] on amion for assistance.

## 2024-06-07 ENCOUNTER — Inpatient Hospital Stay (HOSPITAL_COMMUNITY): Admitting: Anesthesiology

## 2024-06-07 ENCOUNTER — Inpatient Hospital Stay (HOSPITAL_COMMUNITY)

## 2024-06-07 ENCOUNTER — Encounter (HOSPITAL_COMMUNITY): Admission: EM | Disposition: A | Payer: Self-pay | Source: Home / Self Care | Attending: Student

## 2024-06-07 ENCOUNTER — Other Ambulatory Visit (HOSPITAL_COMMUNITY): Payer: Self-pay

## 2024-06-07 ENCOUNTER — Telehealth (HOSPITAL_COMMUNITY): Payer: Self-pay

## 2024-06-07 DIAGNOSIS — F418 Other specified anxiety disorders: Secondary | ICD-10-CM | POA: Diagnosis not present

## 2024-06-07 DIAGNOSIS — J189 Pneumonia, unspecified organism: Secondary | ICD-10-CM

## 2024-06-07 DIAGNOSIS — I4891 Unspecified atrial fibrillation: Secondary | ICD-10-CM

## 2024-06-07 DIAGNOSIS — J449 Chronic obstructive pulmonary disease, unspecified: Secondary | ICD-10-CM

## 2024-06-07 DIAGNOSIS — I10 Essential (primary) hypertension: Secondary | ICD-10-CM

## 2024-06-07 DIAGNOSIS — I4892 Unspecified atrial flutter: Secondary | ICD-10-CM

## 2024-06-07 LAB — ECHO TEE

## 2024-06-07 MED ORDER — PROPOFOL 10 MG/ML IV BOLUS
INTRAVENOUS | Status: DC | PRN
  Administered 2024-06-07: 120 mg via INTRAVENOUS
  Administered 2024-06-07: 60 mg via INTRAVENOUS

## 2024-06-07 MED ORDER — SODIUM CHLORIDE 0.9 % IV SOLN: INTRAVENOUS | Status: DC

## 2024-06-07 NOTE — Plan of Care (Signed)
" °  Problem: Education: Goal: Knowledge of General Education information will improve Description: Including pain rating scale, medication(s)/side effects and non-pharmacologic comfort measures Outcome: Progressing   Problem: Education: Goal: Knowledge of disease or condition will improve Outcome: Progressing   Problem: Activity: Goal: Ability to tolerate increased activity will improve Outcome: Progressing   Problem: Cardiac: Goal: Ability to achieve and maintain adequate cardiopulmonary perfusion will improve Outcome: Progressing   "

## 2024-06-07 NOTE — Progress Notes (Addendum)
 "  Progress Note  Patient Name: Dominique Johnston Date of Encounter: 06/07/2024 [facility] Cardiologist: None   Interval Summary   Denies any chest pain, orthopnea, lower extremity edema, abdominal distention.   Reported that she does have some snoring, daytime somnolence, and generalized fatigue.  Vital Signs Vitals:   06/06/24 2029 06/07/24 0500 06/07/24 0745 06/07/24 0920  BP: (!) 144/100 (!) 147/112 (!) 147/120 (!) 117/105  Pulse:   (!) 112 (!) 130  Resp: 17 18 18 17   Temp: 98.5 F (36.9 C) 98.7 F (37.1 C) 98.6 F (37 C) 98.5 F (36.9 C)  TempSrc: Oral Oral Oral Oral  SpO2: 95% 98% 98% 98%  Weight:      Height:       No intake or output data in the 24 hours ending 06/07/24 0944    06/04/2024    6:35 PM 10/14/2023    2:27 PM 08/21/2023    8:45 AM  Last 3 Weights  Weight (lbs) 189 lb 184 lb 187 lb  Weight (kg) 85.73 kg 83.462 kg 84.823 kg      Telemetry/ECG  Atrial flutter with variable AV conduction and ventricular rates in the 100s to 140s.- Personally Reviewed  Physical Exam  GEN: No acute distress.  Alert and orientated on room air. Neck: No JVD Cardiac: Irregular rhythm, no murmurs, rubs, or gallops.  Respiratory: Diffuse wheezing heard throughout the lungs. GI: Soft, nontender, non-distended  Dominique: No edema  Assessment & Plan  Dominique Johnston is a 79 year old female with history of COPD who presented with shortness of breath and fever and found to have community-acquired pneumonia and was in new onset A-fib with RVR.  She was started on Dilt drip and antibiotics.   Atrial flutter with RVR CHA2DS2-VASc Score = 4 [CHF History: 0, HTN History: 1, Diabetes History: 0, Stroke History: 0, Vascular Disease History: 0, Age Score: 2, Gender Score: 1].  Therefore, the patient's annual risk of stroke is 4.8 %.    EKG on 06/04/2024 showed atrial flutter with variable AV node conduction and a ventricular rate of 117. TTE yesterday showed a normal LVEF of 70 to  75%, mild concentric LVH, mildly elevated pulmonary artery systolic pressure of 44 mmHg, and mild to moderate TR. Was started on Eliquis  5 mg twice daily.  Was initially placed on IV Cardizem  but this was stopped and was restarted on home Cardizem  CD 120 mg daily. Was scheduled for a TEE cardioversion this morning. Reported that she does have some snoring, daytime somnolence, and generalized fatigue. Recommend outpatient sleep study Continue home Cardizem  CD 120 mg daily. Start metoprolol tartrate 25 mg 3 times daily. Continue Eliquis  5 mg twice daily. Scheduled outpatient follow-up with Dr. Joelle on 06/16/2024.    Community-acquired pneumonia Was started on azithromycin  and IV ceftriaxone .  It is likely contributing to the patient's atrial flutter. Management per primary   Hypertension Patient's blood pressures have been elevated.  Most recent blood pressure at around 9 AM was 117/105. Continue losartan  100 mg twice daily.  Otherwise as per atrial flutter with RVR above.     For questions or updates, please contact [facility] Please consult [email or web address] for contact info under   Signed, Morse Clause, PA-C   ADDENDUM:   Patient seen and examined with Morse Clause.  I personally taken a history, examined the patient, reviewed relevant notes,  laboratory data / imaging studies.  I performed a substantive portion of this encounter and formulated the important aspects  of the plan.  I agree with the APP's note, impression, and recommendations; however, I have edited the note to reflect changes or salient points.   Patient denies anginal chest pain or heart failure symptoms. He continues to have productive cough and coughing episodes. Not requiring oxygen supplementation. Scheduled for TEE guided cardioversion  PHYSICAL EXAM: Today's Vitals   06/07/24 0745 06/07/24 0920 06/07/24 1231 06/07/24 1338  BP: (!) 147/120 (!) 117/105 (!) 128/93 (!) 144/97  Pulse: (!) 112 (!) 130  (!) 131 (!) 131  Resp: 18 17 16    Temp: 98.6 F (37 C) 98.5 F (36.9 C) 98.2 F (36.8 C)   TempSrc: Oral Oral Oral   SpO2: 98% 98% 98% 97%  Weight:      Height:      PainSc: 0-No pain  0-No pain    Body mass index is 30.51 kg/m.   Net IO Since Admission: 2,641.3 mL [06/07/24 1341]  Filed Weights   06/04/24 1835  Weight: 85.7 kg    General: Age-appropriate, hemodynamically stable, no acute distress HEENT: Normocephalic, atraumatic, no JVP Lungs: Rhonchi bilaterally Heart: Irregularly irregular, tachycardic, variable S1-S2, no murmurs rubs or gallops appreciated secondary to tachycardia Abdomen: Soft, nontender, nondistended, positive bowel sounds all 4 quadrants Extremities: No edema, warm to touch, Neuro: Nonfocal, moving all 4 extremities Psych: Cooperative and pleasant  EKG: (personally reviewed by me) No new tracings  Telemetry: (personally reviewed by me) Atrial flutter with RVR   Impression & Recommendations: :  Atrial flutter with RVR Community-acquired pneumonia Hypertension.  Patient is currently being treated for community-acquired pneumonia and bronchitis within bilateral lungs fields.  Not on nasal cannula oxygen.   Patient is scheduled for TEE guided cardioversion later today.   Clinically I suspect that her atrial flutter may resurface until unless her community-acquired pneumonia is not fully addressed.  Given the fact that she is in RVR at rest patient would like to proceed forward with TEE guided cardioversion.  Continue Cardizem  and oral anticoagulation.  Risks, notes, and alternatives to anticoagulation discussed.  Patient states that her son is on anticoagulation and is familiar with the medication profile.  Continue telemetry  Currently on antibiotics for her community-acquired pneumonia as directed by primary team   Medical Decision:  Complexity: High -A-fib with RVR with upcoming TEE guided cardioversion Independently reviewed: Vitals,  strict I's and O's, daily weights, telemetry, echocardiogram, labs Prescription drug management -recommendations stated above  Further recommendations to follow as the case evolves.   This note was created using a voice recognition software as a result there may be grammatical errors inadvertently enclosed that do not reflect the nature of this encounter. Every attempt is made to correct such errors.   Madonna Michele HAS, [facility]  A Division of [facility] [address], [place], [place] [address]  06/07/2024 1:41 PM    "

## 2024-06-07 NOTE — Telephone Encounter (Signed)
 harmacy Patient Product/process Development Scientist completed.    The patient is insured through [employer]/ [employer]. Patient has Medicare and is not eligible for a copay card, but may be able to apply for patient assistance or Medicare RX Payment Plan (Patient Must reach out to their plan, if eligible for payment plan), if available.    Ran test claim for Eliquis  mg tablet and the current 30 day co-pay is $49.73.   This test claim was processed through [facility]- copay amounts may vary at other pharmacies due to pharmacy/plan contracts, or as the patient moves through the different stages of their insurance plan.

## 2024-06-07 NOTE — CV Procedure (Signed)
" ° °  TRANSESOPHAGEAL ECHOCARDIOGRAM GUIDED DIRECT CURRENT CARDIOVERSION  NAME:  Dominique Johnston    MRN: [id number] DOB:  03/31/1946    ADMIT DATE: 06/04/2024  INDICATIONS: Symptomatic atrial fibrillation  PROCEDURE:  Informed consent was obtained prior to the procedure. The risks, benefits and alternatives for the procedure were discussed and the patient comprehended these risks.  Risks include, but are not limited to, cough, sore throat, vomiting, nausea, somnolence, esophageal and stomach trauma or perforation, bleeding, low blood pressure, aspiration, pneumonia, infection, trauma to the teeth and death.   After a procedural time-out, the oropharynx was anesthetized and the patient was sedated by the anesthesia service. The transesophageal probe was inserted in the esophagus and stomach without difficulty and multiple views were obtained. Anesthesia was monitored by Dr. Merla.   COMPLICATIONS:   Complications: No complications Patient tolerated procedure well.  KEY FINDINGS: - No LAA clot - Full Report to follow.   CARDIOVERSION:    Indications:  Symptomatic Atrial Fibrillation  Procedure Details: Once the TEE was complete, the patient had the defibrillator pads placed in the anterior and posterior position. Once an appropriate level of sedation was confirmed, the patient was cardioverted x 1 with 200J of biphasic synchronized energy.  The patient converted to NSR.  There were no apparent complications.  The patient had normal neuro status and respiratory status post procedure with vitals stable as recorded elsewhere.  Adequate airway was maintained throughout and vital signs monitored per protocol.  Signed, [name] [name], MD, [facility]  [facility]  2:42 PM  "

## 2024-06-07 NOTE — Progress Notes (Signed)
 [facility] Progress Note  Patient: Dominique Johnston    FMW:[id number]  DOA: 06/04/2024     Date of Service: the patient was seen and examined on 06/07/2024  Chief Complaint  Patient presents with   Palpitations   Brief hospital course: From H&P Gladis Soley is a 79 y.o. female with medical history significant of COPD, depression, hypertension, hyperlipidemia who presents emergency ferment due to heart palpitations.  Patient went to urgent care due to worsening cough and shortness of breath.  Patient was found to have a fever.  At urgent care she was found to be in A-fib and was referred to the ER for further assessment.  On arrival she was afebrile hemodynamically stable.  Labs were obtained which demonstrated creatinine 1.2 at baseline, WBC 9.9, hemoglobin 12.5, respiratory viral panel was negative, lactic acid 1.3.  Patient had chest x-ray which showed left lung base pneumonia.  Patient was started on diltiazem  and admitted for further workup.    Assessment and Plan:   # A-fib with RVR, new onset - Patient present with cough and shortness of breath - Patient found to be tachycardic -s/p diltiazem  drip, transition to Cardizem  IR 30 mg p.o. every 8 hourly 1/24 Started Eliquis  5 mg p.o. twice daily TTE LVEF 70 to 75%, no WMA, no significant valvular abnormality. Cardiology consulted, rec to start Cardizem  CD 120 mg p.o. daily home dose and if patient remains in AF with RVR then patient may need cardioversion. 1/26 patient is scheduled for TEE and cardioversion today NPO since midnight   # Community-acquired pneumonia - Ceftriaxone  and azithromycin  -Mucinex  600 mg p.o. twice daily -Robitussin DM as needed -Claritin  10 mg p.o. daily due to URI symptoms  # Depression-continue bupropion , Lexapro     # Chronic pain-continue gabapentin    # Hypertension-continue losartan      Body mass index is 30.51 kg/m.  Interventions:  Diet: NPO since midnight for  cardioversion DVT Prophylaxis: Eliquis  Consults: Cardiology Procedures: None   Advance goals of care discussion: Full code  Family Communication: family was not present at bedside, at the time of interview.  The pt provided permission to discuss medical plan with the family. Opportunity was given to ask question and all questions were answered satisfactorily.   Disposition:  Pt is from home, admitted with atrial fibrillation, still has A-fib, which precludes a safe discharge. Discharge to home, when stable and cleared by cardiology.  Subjective: No significant events overnight.  Patient is feeling improvement in the shortness of breath and cough.  Still in A-fib but denies any chest pain or palpitations. Patient is aware that she is scheduled for cardioversion today  Physical Exam: General: NAD, lying comfortably Appear in no distress, affect appropriate Eyes: PERRLA ENT: Oral Mucosa Clear, moist  Neck: no JVD,  Cardiovascular: Irregular rhythm, no Murmur,  Respiratory: good air entry bilaterally, mild bibasilar crackles, no wheezes  Abdomen: Bowel Sound present, Soft and no tenderness,  Skin: no rashes Extremities: no Pedal edema, no calf tenderness Neurologic: without any new focal findings Gait not checked due to patient safety concerns  Vitals:   06/07/24 0920 06/07/24 1231 06/07/24 1338 06/07/24 1450  BP: (!) 117/105 (!) 128/93 (!) 144/97 107/69  Pulse: (!) 130 (!) 131 (!) 131 94  Resp: 17 16  16   Temp: 98.5 F (36.9 C) 98.2 F (36.8 C)  97.9 F (36.6 C)  TempSrc: Oral Oral  Oral  SpO2: 98% 98% 97% 96%  Weight:      Height:  Intake/Output Summary (Last 24 hours) at 06/07/2024 1520 Last data filed at 06/07/2024 0800 Gross per 24 hour  Intake 0 ml  Output --  Net 0 ml   Filed Weights   06/04/24 1835  Weight: 85.7 kg    Data Reviewed: I have personally reviewed and interpreted daily labs, tele strips, imagings as discussed above. I reviewed all  nursing notes, pharmacy notes, vitals, pertinent old records I have discussed plan of care as described above with RN and patient/family.  CBC: Recent Labs  Lab 06/04/24 1920 06/05/24 0230 06/05/24 1049  WBC 9.9 7.6 6.8  NEUTROABS 7.5  --   --   HGB 12.5 11.1* 11.1*  HCT 39.0 35.6* 35.7*  MCV 73.6* 74.8* 76.3*  PLT 267 232 221   Basic Metabolic Panel: Recent Labs  Lab 06/04/24 1920 06/05/24 0230 06/05/24 1049  NA 139  --  139  K 3.9  --  4.1  CL 103  --  107  CO2 22  --  22  GLUCOSE 87  --  97  BUN 12  --  10  CREATININE 1.23* 1.11* 1.06*  CALCIUM 9.1  --  8.8*  MG  --   --  2.0  PHOS  --   --  3.4    Studies: EP STUDY Result Date: 06/07/2024 See surgical note for result.   Scheduled Meds:  [MAR Hold] apixaban   5 mg Oral BID   [MAR Hold] azithromycin   500 mg Oral Daily   [MAR Hold] buPROPion   300 mg Oral Daily   [MAR Hold] diltiazem   120 mg Oral Daily   [MAR Hold] dorzolamide   1 drop Both Eyes BID   [MAR Hold] escitalopram   20 mg Oral Daily   [MAR Hold] guaiFENesin   600 mg Oral BID   [MAR Hold] latanoprost   1 drop Both Eyes QHS   [MAR Hold] loratadine   10 mg Oral Daily   [MAR Hold] losartan   100 mg Oral Daily   Continuous Infusions:  sodium chloride      [MAR Hold] cefTRIAXone  (ROCEPHIN )  IV 2 g (06/06/24 2213)   PRN Meds: [MAR Hold] acetaminophen  **OR** [MAR Hold] acetaminophen , [MAR Hold] guaiFENesin -dextromethorphan , [MAR Hold] ondansetron  **OR** [MAR Hold] ondansetron  (ZOFRAN ) IV  Time spent: 40 minutes  Author: ELVAN SOR. MD [facility] 06/07/2024 3:20 PM  To reach On-call, see care teams to locate the attending and reach out to them via [email or web address]. If 7PM-7AM, please contact night-coverage If you still have difficulty reaching the attending provider, please page the [facility] (Director on Call) for [facility] on amion for assistance.

## 2024-06-07 NOTE — Transfer of Care (Signed)
 Immediate Anesthesia Transfer of Care Note  Patient: Dominique Johnston  Procedure(s) Performed: TRANSESOPHAGEAL ECHOCARDIOGRAM CARDIOVERSION  Patient Location: PACU and Cath Lab  Anesthesia Type:MAC  Level of Consciousness: awake and alert   Airway & Oxygen Therapy: Patient Spontanous Breathing and Patient connected to nasal cannula oxygen  Post-op Assessment: Report given to RN and Post -op Vital signs reviewed and stable  Post vital signs: Reviewed and stable  Last Vitals:  Vitals Value Taken Time  BP 107/69 06/07/24 14:50  Temp 36.6 C 06/07/24 14:50  Pulse 94 06/07/24 14:50  Resp 16 06/07/24 14:50  SpO2 96 % 06/07/24 14:50    Last Pain:  Vitals:   06/07/24 1450  TempSrc: Oral  PainSc: Asleep         Complications: There were no known notable events for this encounter.

## 2024-06-07 NOTE — Interval H&P Note (Signed)
 History and Physical Interval Note:  06/07/2024 12:13 PM  Dominique Johnston  has presented today for surgery, with the diagnosis of afib.  The various methods of treatment have been discussed with the patient and family. After consideration of risks, benefits and other options for treatment, the patient has consented to  Procedures: TRANSESOPHAGEAL ECHOCARDIOGRAM (N/A) CARDIOVERSION (N/A) as a surgical intervention.  The patient's history has been reviewed, patient examined, no change in status, stable for surgery.  I have reviewed the patient's chart and labs.  Questions were answered to the patient's satisfaction.     Carsten Carstarphen H Azobou Tonleu

## 2024-06-07 NOTE — TOC Initial Note (Signed)
 Transition of Care [facility]) - Initial/Assessment Note    Patient Details  Name: Dominique Johnston MRN: [id number] Date of Birth: Apr 15, 1946  [facility] CM/SW Contact:    Sudie Erminio Deems, RN Phone Number: 06/07/2024, 11:15 AM  Clinical Narrative: Patient presented for worsening cough and palpitations. PTA Patient was independent from home with support of daughter and grandson. Patient states she does not use any DME in the home. Patient has PCP and no issues getting to appointments. ICM will continue to follow for additional disposition needs as the patient  progresses.         Expected Discharge Plan: Home/Self Care Barriers to Discharge: Continued Medical Work up   Patient Goals and CMS Choice Patient states their goals for this hospitalization and ongoing recovery are:: Plan to return home once stable          Expected Discharge Plan and Services In-house Referral: NA Discharge Planning Services: CM Consult Post Acute Care Choice: NA Living arrangements for the past 2 months: Single Family Home                   DME Agency: NA       HH Arranged: NA          Prior Living Arrangements/Services Living arrangements for the past 2 months: Single Family Home Lives with:: Adult Children Patient language and need for interpreter reviewed:: Yes Do you feel safe going back to the place where you live?: Yes      Need for Family Participation in Patient Care: No (Comment) Care giver support system in place?: No (comment)   Criminal Activity/Legal Involvement Pertinent to Current Situation/Hospitalization: No - Comment as needed  Activities of Daily Living   ADL Screening (condition at time of admission) Independently performs ADLs?: Yes (appropriate for developmental age) Is the patient deaf or have difficulty hearing?: No Does the patient have difficulty seeing, even when wearing glasses/contacts?: No Does the patient have difficulty  concentrating, remembering, or making decisions?: No  Permission Sought/Granted Permission sought to share information with : Family Supports, Magazine Features Editor, Case Estate Manager/land Agent granted to share information with : Yes, Verbal Permission Granted              Emotional Assessment   Attitude/Demeanor/Rapport: Engaged Affect (typically observed): Appropriate Orientation: : Oriented to Place, Oriented to  Time, Oriented to Self, Oriented to Situation Alcohol / Substance Use: Not Applicable Psych Involvement: No (comment)  Admission diagnosis:  Atrial fibrillation (HCC) [I48.91] Atrial fibrillation with RVR (HCC) [I48.91] Community acquired pneumonia of left lower lobe of lung [J18.9] Patient Active Problem List   Diagnosis Date Noted   Atrial fibrillation (HCC) 06/04/2024   Left hip pain 03/22/2019   Mild intermittent asthma 10/08/2016   Chronic cough 06/24/2014   Routine health maintenance 10/25/2012   Borderline high cholesterol 01/19/2010   G E REFLUX 12/10/2007   Generalized anxiety disorder 12/04/2007   Essential hypertension 12/01/2006   Allergic rhinitis 12/01/2006   IRRITABLE BOWEL SYNDROME 12/01/2006   Osteoarthritis 12/01/2006   PCP:  Rollene Almarie LABOR, MD Pharmacy:   [id number] [place], [place] - [address] AT [facility] OF [address] [place] [address] Phone: [phone] Fax: [phone]  [id number] - [place]), [place] - [address] [place]) [place] [address] Phone: [phone] Fax: [phone]     Social Drivers of Health (SDOH) Social History:  SDOH Screenings   Food Insecurity: No Food Insecurity (06/05/2024)  Housing: Low Risk (06/05/2024)  Transportation Needs: No Transportation Needs (06/05/2024)  Utilities: Not At Risk (06/05/2024)  Alcohol Screen: Low Risk (08/14/2023)  Depression (PHQ2-9): Low Risk  (06/23/2023)  Financial Resource Strain: Medium Risk (08/14/2023)  Physical Activity: Insufficiently Active (08/14/2023)  Social Connections: Socially Isolated (06/05/2024)  Stress: Stress Concern Present (08/14/2023)  Tobacco Use: Low Risk (06/04/2024)  Health Literacy: Adequate Health Literacy (06/23/2023)   SDOH Interventions: Social Connections Interventions: [facility] Provided, Inpatient TOC   Readmission Risk Interventions     No data to display

## 2024-06-07 NOTE — Anesthesia Preprocedure Evaluation (Addendum)
"                                    Anesthesia Evaluation  Patient identified by MRN, date of birth, ID band Patient awake    Reviewed: Allergy & Precautions, H&P , NPO status , Patient's Chart, lab work & pertinent test results  Airway Mallampati: III  TM Distance: >3 FB Neck ROM: Full    Dental  (+) Dental Advisory Given, Edentulous Upper   Pulmonary asthma , pneumonia, unresolved, COPD,  COPD inhaler Diagnosed with CAP this admission: on ceftriaxone  and azithro Per pt breathing feels ok today    Pulmonary exam normal breath sounds clear to auscultation       Cardiovascular hypertension (147/96 preop), Pt. on medications pulmonary hypertension (mild pHTN)Normal cardiovascular exam+ Valvular Problems/Murmurs (mild-mod TR)  Rhythm:Irregular Rate:Normal  Echo 06/05/24: 1. Left ventricular ejection fraction, by estimation, is 70 to 75%. Left  ventricular ejection fraction by 2D MOD biplane is 72.7 %. The left  ventricle has hyperdynamic function. The left ventricle has no regional  wall motion abnormalities. There is mild   concentric left ventricular hypertrophy. Left ventricular diastolic  function could not be evaluated.   2. Right ventricular systolic function is normal. The right ventricular  size is normal. There is mildly elevated pulmonary artery systolic  pressure. The estimated right ventricular systolic pressure is 44.0 mmHg.   3. The mitral valve is normal in structure. No evidence of mitral valve  regurgitation. No evidence of mitral stenosis.   4. Tricuspid valve regurgitation is mild to moderate.   5. The aortic valve is normal in structure. Aortic valve regurgitation is  not visualized. No aortic stenosis is present.   6. The inferior vena cava is normal in size with greater than 50%  respiratory variability, suggesting right atrial pressure of 3 mmHg.     Neuro/Psych  Headaches PSYCHIATRIC DISORDERS Anxiety Depression       GI/Hepatic Neg liver  ROS,GERD  Controlled and Medicated,,  Endo/Other  Obesity BMI 31  Renal/GU Renal InsufficiencyRenal disease (cr 1.06)  negative genitourinary   Musculoskeletal  (+) Arthritis , Osteoarthritis,    Abdominal   Peds negative pediatric ROS (+)  Hematology  (+) Blood dyscrasia, anemia Hb 11.1, plt 221   Anesthesia Other Findings   Reproductive/Obstetrics negative OB ROS                              Anesthesia Physical Anesthesia Plan  ASA: 3  Anesthesia Plan: General   Post-op Pain Management:    Induction:   PONV Risk Score and Plan: 2 and Propofol  infusion and TIVA  Airway Management Planned: Natural Airway and Simple Face Mask  Additional Equipment: None  Intra-op Plan:   Post-operative Plan:   Informed Consent: I have reviewed the patients History and Physical, chart, labs and discussed the procedure including the risks, benefits and alternatives for the proposed anesthesia with the patient or authorized representative who has indicated his/her understanding and acceptance.       Plan Discussed with: CRNA  Anesthesia Plan Comments:          Anesthesia Quick Evaluation  "

## 2024-06-08 ENCOUNTER — Encounter (HOSPITAL_COMMUNITY): Payer: Self-pay

## 2024-06-08 ENCOUNTER — Other Ambulatory Visit (HOSPITAL_COMMUNITY): Payer: Self-pay

## 2024-06-08 DIAGNOSIS — I4891 Unspecified atrial fibrillation: Secondary | ICD-10-CM | POA: Diagnosis not present

## 2024-06-08 LAB — BASIC METABOLIC PANEL WITH GFR
Anion gap: 11 (ref 5–15)
BUN: 13 mg/dL (ref 8–23)
CO2: 23 mmol/L (ref 22–32)
Calcium: 8.8 mg/dL — ABNORMAL LOW (ref 8.9–10.3)
Chloride: 107 mmol/L (ref 98–111)
Creatinine, Ser: 1.33 mg/dL — ABNORMAL HIGH (ref 0.44–1.00)
GFR, Estimated: 41 mL/min — ABNORMAL LOW
Glucose, Bld: 92 mg/dL (ref 70–99)
Potassium: 3.7 mmol/L (ref 3.5–5.1)
Sodium: 140 mmol/L (ref 135–145)

## 2024-06-08 LAB — CBC
HCT: 35.8 % — ABNORMAL LOW (ref 36.0–46.0)
Hemoglobin: 11.3 g/dL — ABNORMAL LOW (ref 12.0–15.0)
MCH: 23.8 pg — ABNORMAL LOW (ref 26.0–34.0)
MCHC: 31.6 g/dL (ref 30.0–36.0)
MCV: 75.4 fL — ABNORMAL LOW (ref 80.0–100.0)
Platelets: 244 10*3/uL (ref 150–400)
RBC: 4.75 MIL/uL (ref 3.87–5.11)
RDW: 15.6 % — ABNORMAL HIGH (ref 11.5–15.5)
WBC: 5.3 10*3/uL (ref 4.0–10.5)
nRBC: 0 % (ref 0.0–0.2)

## 2024-06-08 LAB — PHOSPHORUS: Phosphorus: 4.7 mg/dL — ABNORMAL HIGH (ref 2.5–4.6)

## 2024-06-08 LAB — MAGNESIUM: Magnesium: 2.2 mg/dL (ref 1.7–2.4)

## 2024-06-08 MED ORDER — AMOXICILLIN-POT CLAVULANATE 875-125 MG PO TABS
1.0000 | ORAL_TABLET | Freq: Two times a day (BID) | ORAL | 0 refills | Status: AC
  Filled 2024-06-08: qty 8, 4d supply, fill #0

## 2024-06-08 MED ORDER — APIXABAN 5 MG PO TABS
5.0000 mg | ORAL_TABLET | Freq: Two times a day (BID) | ORAL | 11 refills | Status: AC
  Filled 2024-06-08: qty 60, 30d supply, fill #0

## 2024-06-08 MED ORDER — GUAIFENESIN ER 600 MG PO TB12
600.0000 mg | ORAL_TABLET | Freq: Two times a day (BID) | ORAL | 0 refills | Status: AC
  Filled 2024-06-08: qty 10, 5d supply, fill #0

## 2024-06-08 MED ORDER — GUAIFENESIN-DM 100-10 MG/5ML PO SYRP
10.0000 mL | ORAL_SOLUTION | Freq: Four times a day (QID) | ORAL | 0 refills | Status: AC | PRN
  Filled 2024-06-08: qty 118, 3d supply, fill #0

## 2024-06-08 NOTE — Progress Notes (Signed)
 "  Progress Note  Patient Name: Dominique Johnston Date of Encounter: 06/08/2024  Primary Cardiologist: None   Subjective   Patient seen and examined at her bedside.  Inpatient Medications    Scheduled Meds:  apixaban   5 mg Oral BID   azithromycin   500 mg Oral Daily   buPROPion   300 mg Oral Daily   diltiazem   120 mg Oral Daily   dorzolamide   1 drop Both Eyes BID   escitalopram   20 mg Oral Daily   guaiFENesin   600 mg Oral BID   latanoprost   1 drop Both Eyes QHS   loratadine   10 mg Oral Daily   losartan   100 mg Oral Daily   Continuous Infusions:  cefTRIAXone  (ROCEPHIN )  IV 2 g (06/07/24 2142)   PRN Meds: acetaminophen  **OR** acetaminophen , guaiFENesin -dextromethorphan , ondansetron  **OR** ondansetron  (ZOFRAN ) IV   Vital Signs    Vitals:   06/07/24 1700 06/07/24 2025 06/07/24 2348 06/08/24 0416  BP: (!) 143/94 129/79 117/76 116/84  Pulse:  (!) 110 63 65  Resp: 18 18 18 18   Temp: 98.2 F (36.8 C) 99.3 F (37.4 C) 98.2 F (36.8 C) 98.2 F (36.8 C)  TempSrc: Oral Oral Oral Oral  SpO2:  100% 99% 99%  Weight:      Height:        Intake/Output Summary (Last 24 hours) at 06/08/2024 9077 Last data filed at 06/07/2024 2130 Gross per 24 hour  Intake 600 ml  Output --  Net 600 ml   Filed Weights   06/04/24 1835  Weight: 85.7 kg    Telemetry    Sinus rhythm with occasional PVC - Personally Reviewed  ECG     - Personally Reviewed  Physical Exam    General: Comfortable,  Head: Atraumatic, normal size  Eyes: PEERLA, EOMI  Neck: Supple, normal JVD Cardiac: Normal S1, S2; RRR; no murmurs, rubs, or gallops Lungs: Clear to auscultation bilaterally Abd: Soft, nontender, no hepatomegaly  Ext: warm, no edema Musculoskeletal: No deformities, BUE and BLE strength normal and equal Skin: Warm and dry, no rashes   Neuro: Alert and oriented to person, place, time, and situation, CNII-XII grossly intact, no focal deficits  Psych: Normal mood and affect   Labs     Chemistry Recent Labs  Lab 06/04/24 1920 06/05/24 0230 06/05/24 1049 06/08/24 0437  NA 139  --  139 140  K 3.9  --  4.1 3.7  CL 103  --  107 107  CO2 22  --  22 23  GLUCOSE 87  --  97 92  BUN 12  --  10 13  CREATININE 1.23* 1.11* 1.06* 1.33*  CALCIUM 9.1  --  8.8* 8.8*  PROT 7.4  --   --   --   ALBUMIN 4.0  --   --   --   AST 21  --   --   --   ALT 11  --   --   --   ALKPHOS 107  --   --   --   BILITOT 0.6  --   --   --   GFRNONAA 45* 51* 54* 41*  ANIONGAP 14  --  10 11     Hematology Recent Labs  Lab 06/05/24 0230 06/05/24 1049 06/08/24 0437  WBC 7.6 6.8 5.3  RBC 4.76 4.68 4.75  HGB 11.1* 11.1* 11.3*  HCT 35.6* 35.7* 35.8*  MCV 74.8* 76.3* 75.4*  MCH 23.3* 23.7* 23.8*  MCHC 31.2 31.1 31.6  RDW  15.6* 16.0* 15.6*  PLT 232 221 244    Cardiac EnzymesNo results for input(s): TROPONINI in the last 168 hours. No results for input(s): TROPIPOC in the last 168 hours.   BNPNo results for input(s): BNP, PROBNP in the last 168 hours.   DDimer No results for input(s): DDIMER in the last 168 hours.   Radiology    ECHO TEE Result Date: 06/07/2024    TRANSESOPHOGEAL ECHO REPORT   Patient Name:   Dominique Johnston [facility] Date of Exam: 06/07/2024 Medical Rec #:  [id number]               Height:       66.0 in Accession #:    [id number]              Weight:       189.0 lb Date of Birth:  1946-03-28               BSA:          1.952 m Patient Age:    78 years                BP:           150/120 mmHg Patient Gender: F                       HR:           119 bpm. Exam Location:  Inpatient Procedure: Transesophageal Echo, Cardiac Doppler and Color Doppler (Both            Spectral and Color Flow Doppler were utilized during procedure). Indications:    Arrhythmia  History:        Patient has prior history of Echocardiogram examinations, most                 recent 06/06/2024. COPD; Risk Factors:Hypertension.  Sonographer:    Jayson Gaskins Referring Phys: [id number] LORETTE CINDERELLA KAPUR  PROCEDURE: After discussion of the risks and benefits of a TEE, an informed consent was obtained from the patient. The transesophogeal probe was passed without difficulty through the esophogus of the patient. Sedation performed by different physician. The patient was monitored while under deep sedation. Anesthestetic sedation was provided intravenously by Anesthesiology: 180mg  of Propofol . The patient developed no complications during the procedure. A successful direct current cardioversion was performed at 200 joules with 1 attempt.  IMPRESSIONS  1. Left ventricular ejection fraction, by estimation, is 60 to 65%. The left ventricle has normal function.  2. Left atrial size was mild to moderately dilated. No left atrial/left atrial appendage thrombus was detected.  3. The mitral valve is grossly normal. Trivial mitral valve regurgitation.  4. The aortic valve is tricuspid. Aortic valve regurgitation is not visualized. FINDINGS  Left Ventricle: Left ventricular ejection fraction, by estimation, is 60 to 65%. The left ventricle has normal function. Left Atrium: Left atrial size was mild to moderately dilated. No left atrial/left atrial appendage thrombus was detected. Right Atrium: Right atrial size was normal in size. Pericardium: There is no evidence of pericardial effusion. Mitral Valve: The mitral valve is grossly normal. Trivial mitral valve regurgitation. Tricuspid Valve: The tricuspid valve is grossly normal. Tricuspid valve regurgitation is trivial. Aortic Valve: The aortic valve is tricuspid. Aortic valve regurgitation is not visualized. Pulmonic Valve: The pulmonic valve was grossly normal. Pulmonic valve regurgitation is trivial. Aorta: The aortic root, ascending aorta and aortic arch are all structurally normal, with no evidence of  dilitation or obstruction. IAS/Shunts: No atrial level shunt detected by color flow Doppler. Joelle Cedars Tonleu Electronically signed by Joelle Cedars Ny Signature  Date/Time: 06/07/2024/3:32:33 PM    Final    EP STUDY Result Date: 06/07/2024 See surgical note for result.  ECHOCARDIOGRAM COMPLETE Result Date: 06/06/2024    ECHOCARDIOGRAM REPORT   Patient Name:   Dominique Johnston [facility] Date of Exam: 06/06/2024 Medical Rec #:  [id number]               Height:       66.0 in Accession #:    [id number]              Weight:       189.0 lb Date of Birth:  [birth year]               BSA:          1.952 m Patient Age:    78 years                BP:           159/98 mmHg Patient Gender: F                       HR:           126 bpm. Exam Location:  Inpatient Procedure: 2D Echo, Cardiac Doppler and Color Doppler (Both Spectral and Color            Flow Doppler were utilized during procedure). Indications:    AFIB I48.91  History:        Patient has no prior history of Echocardiogram examinations.                 Arrythmias:Atrial Fibrillation.  Sonographer:    Nathanel Devonshire Referring Phys: [id number] ELVAN SOR IMPRESSIONS  1. Left ventricular ejection fraction, by estimation, is 70 to 75%. Left ventricular ejection fraction by 2D MOD biplane is 72.7 %. The left ventricle has hyperdynamic function. The left ventricle has no regional wall motion abnormalities. There is mild  concentric left ventricular hypertrophy. Left ventricular diastolic function could not be evaluated.  2. Right ventricular systolic function is normal. The right ventricular size is normal. There is mildly elevated pulmonary artery systolic pressure. The estimated right ventricular systolic pressure is 44.0 mmHg.  3. The mitral valve is normal in structure. No evidence of mitral valve regurgitation. No evidence of mitral stenosis.  4. Tricuspid valve regurgitation is mild to moderate.  5. The aortic valve is normal in structure. Aortic valve regurgitation is not visualized. No aortic stenosis is present.  6. The inferior vena cava is normal in size with greater than 50% respiratory variability, suggesting right atrial  pressure of 3 mmHg. Comparison(s): No prior Echocardiogram. FINDINGS  Left Ventricle: Left ventricular ejection fraction, by estimation, is 70 to 75%. Left ventricular ejection fraction by 2D MOD biplane is 72.7 %. The left ventricle has hyperdynamic function. The left ventricle has no regional wall motion abnormalities. The left ventricular internal cavity size was normal in size. There is mild concentric left ventricular hypertrophy. Left ventricular diastolic function could not be evaluated due to atrial fibrillation. Left ventricular diastolic function could not be evaluated. Right Ventricle: The right ventricular size is normal. No increase in right ventricular wall thickness. Right ventricular systolic function is normal. There is mildly elevated pulmonary artery systolic pressure. The tricuspid regurgitant velocity is 3.20  m/s, and with an assumed right atrial pressure of 3  mmHg, the estimated right ventricular systolic pressure is 44.0 mmHg. Left Atrium: Left atrial size was normal in size. Right Atrium: Right atrial size was normal in size. Pericardium: There is no evidence of pericardial effusion. Mitral Valve: The mitral valve is normal in structure. No evidence of mitral valve regurgitation. No evidence of mitral valve stenosis. Tricuspid Valve: The tricuspid valve is normal in structure. Tricuspid valve regurgitation is mild to moderate. No evidence of tricuspid stenosis. Aortic Valve: The aortic valve is normal in structure. Aortic valve regurgitation is not visualized. No aortic stenosis is present. Aortic valve mean gradient measures 2.0 mmHg. Aortic valve peak gradient measures 4.4 mmHg. Aortic valve area, by VTI measures 3.09 cm. Pulmonic Valve: The pulmonic valve was normal in structure. Pulmonic valve regurgitation is not visualized. No evidence of pulmonic stenosis. Aorta: The aortic root is normal in size and structure. Venous: The inferior vena cava is normal in size with greater than 50%  respiratory variability, suggesting right atrial pressure of 3 mmHg. IAS/Shunts: No atrial level shunt detected by color flow Doppler.  LEFT VENTRICLE PLAX 2D                        Biplane EF (MOD) LVIDd:         5.80 cm         LV Biplane EF:   Left LVIDs:         3.70 cm                          ventricular LV PW:         1.00 cm                          ejection LV IVS:        1.10 cm                          fraction by LVOT diam:     1.99 cm                          2D MOD LV SV:         51                               biplane is LV SV Index:   26                               72.7 %. LVOT Area:     3.11 cm                                Diastology                                LV e' medial:   12.30 cm/s LV Volumes (MOD)               LV E/e' medial: 6.7 LV vol d, MOD    41.9 ml A2C: LV vol d, MOD    52.5 ml A4C: LV vol s, MOD    11.8 ml A2C: LV vol s,  MOD    15.3 ml A4C: LV SV MOD A2C:   30.1 ml LV SV MOD A4C:   52.5 ml LV SV MOD BP:    35.8 ml RIGHT VENTRICLE          IVC RV Basal diam:  2.85 cm  IVC diam: 1.83 cm TAPSE (M-mode): 1.7 cm LEFT ATRIUM             Index        RIGHT ATRIUM           Index LA diam:        3.36 cm 1.72 cm/m   RA Area:     12.90 cm LA Vol (A2C):   42.5 ml 21.77 ml/m  RA Volume:   32.90 ml  16.85 ml/m LA Vol (A4C):   38.6 ml 19.77 ml/m LA Biplane Vol: 43.7 ml 22.38 ml/m  AORTIC VALVE                    PULMONIC VALVE AV Area (Vmax):    2.62 cm     PV Vmax:       1.02 m/s AV Area (Vmean):   2.67 cm     PV Peak grad:  4.2 mmHg AV Area (VTI):     3.09 cm AV Vmax:           105.00 cm/s AV Vmean:          70.700 cm/s AV VTI:            0.166 m AV Peak Grad:      4.4 mmHg AV Mean Grad:      2.0 mmHg LVOT Vmax:         88.60 cm/s LVOT Vmean:        60.800 cm/s LVOT VTI:          0.165 m LVOT/AV VTI ratio: 0.99  AORTA Ao Root diam: 3.22 cm Ao Asc diam:  2.83 cm MV E velocity: 82.30 cm/s  TRICUSPID VALVE                            TR Peak grad:   41.0 mmHg                             TR Vmax:        320.00 cm/s                             SHUNTS                            Systemic VTI:  0.16 m                            Systemic Diam: 1.99 cm Joelle Azobou Tonleu Electronically signed by Joelle Cedars Tonleu Signature Date/Time: 06/06/2024/11:26:36 AM    Final     Cardiac Studies     Patient Profile     79 y.o. female with atrial flutter with rvr, status post cardioversion  Assessment & Plan    Atrial flutter Hypertension  Elevated RVSP - Mild Pulmonary Hypertension  CAP   She is status post TEE/DCCV, in sinus rhythm. Continue current dose of Cardizem  120 mg daily with Eliquis   5 mg daily.  Blood pressure is at target.   No medication changes at this time.   We will set her up with a follow up visit.   Will sign off at this time.     For questions or updates, please contact [facility] Please consult [email or web address] for contact info under Cardiology/STEMI.      Signed, Jannet Calip, DO  06/08/2024, 9:22 AM    "

## 2024-06-08 NOTE — Care Management Important Message (Signed)
 Important Message  Patient Details  Name: Dominique Johnston MRN: [id number] Date of Birth: 1945/12/27   Important Message Given:  Yes - Medicare IM     [name] 06/08/2024, 12:47 PM

## 2024-06-08 NOTE — Progress Notes (Unsigned)
"   °  °  Cardiology Office Note Date:  06/08/2024  ID:  Dominique Johnston, Dominique Johnston [birth year], MRN [id number] PCP:  Rollene Almarie LABOR, MD  Cardiologist:  Joelle VEAR Ren Donley, MD  No chief complaint on file.    Problems Afib/AFL s/p TEE DCCV 1/26 TTE 1/26: 70-75%, mild LVH, RVSP 40, mild-mod TR MBETHA KEARNS, DM120  Visits  2/26: [facility]    Discussed the use of AI scribe software for clinical note transcription with the patient, who gave verbal consent to proceed.  History of Present Illness     ROS: Please see the history of present illness. All other systems are reviewed and negative.    PHYSICAL EXAM: VS:  There were no vitals taken for this visit. , BMI There is no height or weight on file to calculate BMI. GEN: Well nourished, well developed, in no acute distress HEENT: normal Neck: no JVD, carotid bruits, or masses Cardiac: ***RRR; no murmurs, rubs, or gallops,no edema  Respiratory:  CTAB bilaterally, normal work of breathing GI: soft, nontender, nondistended, + BS Extremities: No LE edema Skin: warm and dry, no rash Neuro:  Strength and sensation are intact  EKG: ***  Recent Labs: Reviewed  Studies: Reviewed  Assessment and Plan Assessment & Plan      Signed, Joelle VEAR Ren Donley, MD  06/08/2024 1:21 PM    [facility] "

## 2024-06-08 NOTE — Discharge Summary (Signed)
 [facility] Discharge Summary   Patient: Dominique Johnston FMW:[id number]  PCP: Rollene Almarie LABOR, MD  Date of admission: 06/04/2024   Date of discharge:  06/08/2024     Discharge Diagnoses:  Principal Problem:   Atrial fibrillation with RVR (HCC) Active Problems:   Atrial flutter with rapid ventricular response (HCC)   Community acquired pneumonia of left lower lobe of lung   Benign hypertension   Admitted From: Home Disposition:  Home   Recommendations for Outpatient Follow-up:  F/u with PCP in 1 wk F/u Cardiology in 1 -2 weeks  Follow up LABS/TEST:  Repeat CBC and BMP in 1 wk   Follow-up Information     Rollene Almarie LABOR, MD Follow up in 1 week(s).   Specialty: Internal Medicine Contact information: [address] [place] [address] [phone]         Ren Donley Joelle VEAR, MD Follow up in 1 week(s).   Specialty: Cardiology Contact information: [address] [place] [address] [phone]                Diet recommendation: Cardiac diet  Activity: The patient is advised to gradually reintroduce usual activities, as tolerated  Discharge Condition: stable  Code Status: Full code   History of present illness: As per the H and P dictated on admission. Hospital Course:  From H&P Dominique Johnston is a 79 y.o. female with medical history significant of COPD, depression, hypertension, hyperlipidemia who presents emergency ferment due to heart palpitations.  Patient went to urgent care due to worsening cough and shortness of breath.  Patient was found to have a fever.  At urgent care she was found to be in A-fib and was referred to the ER for further assessment.  On arrival she was afebrile hemodynamically stable.  Labs were obtained which demonstrated creatinine 1.2 at baseline, WBC 9.9, hemoglobin 12.5, respiratory viral panel was negative, lactic acid 1.3.  Patient had chest x-ray which showed left lung  base pneumonia.  Patient was started on diltiazem  and admitted for further workup.      Assessment and Plan:    # A-fib with RVR, new onset. S/p TEE and cardioversion done on 1/26, converted back to normal sinus rhythm. - Patient present with cough and shortness of breath - Patient found to be tachycardic -s/p diltiazem  drip, >>Cardizem  IR 30 mg p.o.q8 >> Cardizem  CD 120 mg p.o. daily. On 1/24 Started Eliquis  5 mg p.o. twice daily TTE LVEF 70 to 75%, no WMA, no significant valvular abnormality. Cardiology consulted, rec to start Cardizem  CD 120 mg p.o. daily home dose and if patient remains in AF with RVR then patient may need cardioversion. 1/26 s/p TEE and cardioversion.  Tolerated well, no complications. 1/27 patient remained in normal sinus rhythm, cleared by cardiology to continue Cardizem  CD 120 mg and Eliquis  5 mg p.o. twice daily.  Follow-up with cardiology as an outpatient.  # Community-acquired pneumonia -s/p Ceftriaxone  and azithromycin  given during hospital stay. S/p Mucinex  600 mg p.o. twice daily and Robitussin DM prn S/p Claritin  10 mg p.o. daily due to URI symptoms. Transition to oral antibiotics Augmentin  twice daily for 4 days to complete 7-day course.  Continue Mucinex  and Robitussin DM as needed.  # COPD: Continued home inhalers. # Depression-continue bupropion , Lexapro  # Chronic pain-continue gabapentin  # Hypertension: Blood pressure remains soft, held losartan  for now, patient was advised to monitor BP at home and follow with PCP to resume losartan  when patient is hypertensive.  Patient was on potassium supplement which also helped, recommended to repeat BMP after 1 week and then resume if hypokalemic.   Body mass index is 30.51 kg/m.  Nutrition Interventions:  - Patient was instructed, not to drive, operate heavy machinery, perform activities at heights, swimming or participation in water activities or provide baby sitting services while on Pain, Sleep and  Anxiety Medications; until her outpatient Physician has advised to do so again.  - Also recommended to not to take more than prescribed Pain, Sleep and Anxiety Medications.  Patient was ambulatory without any assistance.  [place] on the day of the discharge the patient's vitals were stable, and no other acute medical condition were reported by patient. the patient was felt safe to be discharge at Home.  Consultants: Cardiology Procedures: s/p TEE and cardioversion  Discharge Exam: General: Appear in no distress, Oral Mucosa Clear, moist. Cardiovascular: S1 and S2 Present, no Murmur, Respiratory: normal respiratory effort, Bilateral Air entry present and no Crackles, no wheezes Abdomen: Bowel Sound present, Soft and no tenderness. Extremities: no Pedal edema, no calf tenderness Neurology: alert and oriented to time, place, and person affect appropriate.  Filed Weights   06/04/24 1835  Weight: 85.7 kg   Vitals:   06/08/24 0921 06/08/24 1045  BP: 120/64 130/86  Pulse: 63   Resp: 20   Temp: 98.9 F (37.2 C)   SpO2: 100%     DISCHARGE MEDICATION: Allergies as of 06/08/2024       Reactions   Enalapril Maleate    REACTION: swelling, headache   Sulfamethoxazole-trimethoprim    Abdominal pain, redness eyes        Medication List     PAUSE taking these medications    losartan  100 MG tablet Wait to take this until your doctor or other care provider tells you to start again. Commonly known as: COZAAR  Take 1 tablet (100 mg total) by mouth daily. What changed: when to take this   potassium chloride  8 MEQ tablet Wait to take this until your doctor or other care provider tells you to start again. Commonly known as: KLOR-CON  TAKE 1 TABLET(8 MEQ) BY MOUTH DAILY       STOP taking these medications    Advil  Dual Action 125-250 MG Tabs Generic drug: Ibuprofen -Acetaminophen        TAKE these medications    albuterol  108 (90 Base) MCG/ACT inhaler Commonly known as:  VENTOLIN  HFA Inhale 2 puffs into the lungs every 6 (six) hours as needed.   amoxicillin -clavulanate 875-125 MG tablet Commonly known as: AUGMENTIN  Take 1 tablet by mouth 2 (two) times daily for 4 days.   apixaban  5 MG Tabs tablet Commonly known as: ELIQUIS  Take 1 tablet (5 mg total) by mouth 2 (two) times daily.   benzonatate  100 MG capsule Commonly known as: TESSALON  Take 1 capsule (100 mg total) by mouth 3 (three) times daily as needed for cough.   buPROPion  300 MG 24 hr tablet Commonly known as: WELLBUTRIN  XL TAKE 1 TABLET(300 MG) BY MOUTH DAILY What changed: See the new instructions.   diclofenac  Sodium 1 % Gel Commonly known as: VOLTAREN  Apply 4 g topically 4 (four) times daily.   dicyclomine  20 MG tablet Commonly known as: BENTYL  TAKE 1 TABLET BY MOUTH FOUR TIMES DAILY, BEFORE MEALS AND AT BEDTIME What changed:  how much to take how to take this when to take this additional instructions   diltiazem  120 MG 24 hr capsule Commonly known as: TIAZAC  Take 1 capsule (120 mg  total) by mouth daily.   dorzolamide  2 % ophthalmic solution Commonly known as: TRUSOPT  Place 1 drop into both eyes 2 (two) times daily.   escitalopram  10 MG tablet Commonly known as: LEXAPRO  Take 2 tablets (20 mg total) by mouth daily.   fluticasone  50 MCG/ACT nasal spray Commonly known as: FLONASE  Place 1 spray into both nostrils daily. What changed:  when to take this reasons to take this   guaiFENesin  600 MG 12 hr tablet Commonly known as: MUCINEX  Take 1 tablet (600 mg total) by mouth 2 (two) times daily for 5 days. What changed:  how much to take when to take this reasons to take this   guaiFENesin -dextromethorphan  100-10 MG/5ML syrup Commonly known as: ROBITUSSIN DM Take 10 mLs by mouth every 6 (six) hours as needed for cough.   ipratropium 0.06 % nasal spray Commonly known as: ATROVENT  Place 2 sprays into both nostrils 3 (three) times daily as needed for rhinitis.    latanoprost  0.005 % ophthalmic solution Commonly known as: XALATAN  Place 1 drop into both eyes daily.   levocetirizine 5 MG tablet Commonly known as: XYZAL Take 2.5 mg by mouth every evening.   multivitamin tablet Take 1 tablet by mouth daily.   omeprazole  40 MG capsule Commonly known as: PRILOSEC TAKE 1 CAPSULE(40 MG) BY MOUTH DAILY   VITAMIN C PO Take 1 tablet by mouth daily.   Wixela Inhub 250-50 MCG/ACT Aepb Generic drug: fluticasone -salmeterol Inhale 1 puff into the lungs 2 (two) times daily.       Allergies[1] Discharge Instructions     Call MD for:   Complete by: As directed    Palpitations and/or chest pain   Call MD for:  difficulty breathing, headache or visual disturbances   Complete by: As directed    Call MD for:  extreme fatigue   Complete by: As directed    Call MD for:  persistant dizziness or light-headedness   Complete by: As directed    Call MD for:  severe uncontrolled pain   Complete by: As directed    Call MD for:  temperature >100.4   Complete by: As directed    Discharge instructions   Complete by: As directed    F/u with PCP in 1 wk F/u Cardiology in 1 -2 weeks  Repeat CBC and BMP in 1 wk   Increase activity slowly   Complete by: As directed        The results of significant diagnostics from this hospitalization (including imaging, microbiology, ancillary and laboratory) are listed below for reference.    Significant Diagnostic Studies: ECHO TEE Result Date: 06/07/2024    TRANSESOPHOGEAL ECHO REPORT   Patient Name:   Amarachi DAWKINS [facility] Date of Exam: 06/07/2024 Medical Rec #:  [id number]               Height:       66.0 in Accession #:    [id number]              Weight:       189.0 lb Date of Birth:  April 22, 1946               BSA:          1.952 m Patient Age:    78 years                BP:           150/120 mmHg Patient Gender: F  HR:           119 bpm. Exam Location:  Inpatient Procedure: Transesophageal Echo,  Cardiac Doppler and Color Doppler (Both            Spectral and Color Flow Doppler were utilized during procedure). Indications:    Arrhythmia  History:        Patient has prior history of Echocardiogram examinations, most                 recent 06/06/2024. COPD; Risk Factors:Hypertension.  Sonographer:    Jayson Gaskins Referring Phys: [id number] LORETTE CINDERELLA KAPUR PROCEDURE: After discussion of the risks and benefits of a TEE, an informed consent was obtained from the patient. The transesophogeal probe was passed without difficulty through the esophogus of the patient. Sedation performed by different physician. The patient was monitored while under deep sedation. Anesthestetic sedation was provided intravenously by Anesthesiology: 180mg  of Propofol . The patient developed no complications during the procedure. A successful direct current cardioversion was performed at 200 joules with 1 attempt.  IMPRESSIONS  1. Left ventricular ejection fraction, by estimation, is 60 to 65%. The left ventricle has normal function.  2. Left atrial size was mild to moderately dilated. No left atrial/left atrial appendage thrombus was detected.  3. The mitral valve is grossly normal. Trivial mitral valve regurgitation.  4. The aortic valve is tricuspid. Aortic valve regurgitation is not visualized. FINDINGS  Left Ventricle: Left ventricular ejection fraction, by estimation, is 60 to 65%. The left ventricle has normal function. Left Atrium: Left atrial size was mild to moderately dilated. No left atrial/left atrial appendage thrombus was detected. Right Atrium: Right atrial size was normal in size. Pericardium: There is no evidence of pericardial effusion. Mitral Valve: The mitral valve is grossly normal. Trivial mitral valve regurgitation. Tricuspid Valve: The tricuspid valve is grossly normal. Tricuspid valve regurgitation is trivial. Aortic Valve: The aortic valve is tricuspid. Aortic valve regurgitation is not visualized. Pulmonic Valve:  The pulmonic valve was grossly normal. Pulmonic valve regurgitation is trivial. Aorta: The aortic root, ascending aorta and aortic arch are all structurally normal, with no evidence of dilitation or obstruction. IAS/Shunts: No atrial level shunt detected by color flow Doppler. Joelle Cedars Tonleu Electronically signed by Joelle Cedars Ny Signature Date/Time: 06/07/2024/3:32:33 PM    Final    EP STUDY Result Date: 06/07/2024 See surgical note for result.  ECHOCARDIOGRAM COMPLETE Result Date: 06/06/2024    ECHOCARDIOGRAM REPORT   Patient Name:   Joann DAWKINS [facility] Date of Exam: 06/06/2024 Medical Rec #:  [id number]               Height:       66.0 in Accession #:    [id number]              Weight:       189.0 lb Date of Birth:  04-Mar-1946               BSA:          1.952 m Patient Age:    78 years                BP:           159/98 mmHg Patient Gender: F                       HR:           126 bpm. Exam Location:  Inpatient Procedure: 2D Echo,  Cardiac Doppler and Color Doppler (Both Spectral and Color            Flow Doppler were utilized during procedure). Indications:    AFIB I48.91  History:        Patient has no prior history of Echocardiogram examinations.                 Arrythmias:Atrial Fibrillation.  Sonographer:    Nathanel Devonshire Referring Phys: [id number] ELVAN SOR IMPRESSIONS  1. Left ventricular ejection fraction, by estimation, is 70 to 75%. Left ventricular ejection fraction by 2D MOD biplane is 72.7 %. The left ventricle has hyperdynamic function. The left ventricle has no regional wall motion abnormalities. There is mild  concentric left ventricular hypertrophy. Left ventricular diastolic function could not be evaluated.  2. Right ventricular systolic function is normal. The right ventricular size is normal. There is mildly elevated pulmonary artery systolic pressure. The estimated right ventricular systolic pressure is 44.0 mmHg.  3. The mitral valve is normal in structure. No evidence of  mitral valve regurgitation. No evidence of mitral stenosis.  4. Tricuspid valve regurgitation is mild to moderate.  5. The aortic valve is normal in structure. Aortic valve regurgitation is not visualized. No aortic stenosis is present.  6. The inferior vena cava is normal in size with greater than 50% respiratory variability, suggesting right atrial pressure of 3 mmHg. Comparison(s): No prior Echocardiogram. FINDINGS  Left Ventricle: Left ventricular ejection fraction, by estimation, is 70 to 75%. Left ventricular ejection fraction by 2D MOD biplane is 72.7 %. The left ventricle has hyperdynamic function. The left ventricle has no regional wall motion abnormalities. The left ventricular internal cavity size was normal in size. There is mild concentric left ventricular hypertrophy. Left ventricular diastolic function could not be evaluated due to atrial fibrillation. Left ventricular diastolic function could not be evaluated. Right Ventricle: The right ventricular size is normal. No increase in right ventricular wall thickness. Right ventricular systolic function is normal. There is mildly elevated pulmonary artery systolic pressure. The tricuspid regurgitant velocity is 3.20  m/s, and with an assumed right atrial pressure of 3 mmHg, the estimated right ventricular systolic pressure is 44.0 mmHg. Left Atrium: Left atrial size was normal in size. Right Atrium: Right atrial size was normal in size. Pericardium: There is no evidence of pericardial effusion. Mitral Valve: The mitral valve is normal in structure. No evidence of mitral valve regurgitation. No evidence of mitral valve stenosis. Tricuspid Valve: The tricuspid valve is normal in structure. Tricuspid valve regurgitation is mild to moderate. No evidence of tricuspid stenosis. Aortic Valve: The aortic valve is normal in structure. Aortic valve regurgitation is not visualized. No aortic stenosis is present. Aortic valve mean gradient measures 2.0 mmHg. Aortic  valve peak gradient measures 4.4 mmHg. Aortic valve area, by VTI measures 3.09 cm. Pulmonic Valve: The pulmonic valve was normal in structure. Pulmonic valve regurgitation is not visualized. No evidence of pulmonic stenosis. Aorta: The aortic root is normal in size and structure. Venous: The inferior vena cava is normal in size with greater than 50% respiratory variability, suggesting right atrial pressure of 3 mmHg. IAS/Shunts: No atrial level shunt detected by color flow Doppler.  LEFT VENTRICLE PLAX 2D                        Biplane EF (MOD) LVIDd:         5.80 cm         LV Biplane  EF:   Left LVIDs:         3.70 cm                          ventricular LV PW:         1.00 cm                          ejection LV IVS:        1.10 cm                          fraction by LVOT diam:     1.99 cm                          2D MOD LV SV:         51                               biplane is LV SV Index:   26                               72.7 %. LVOT Area:     3.11 cm                                Diastology                                LV e' medial:   12.30 cm/s LV Volumes (MOD)               LV E/e' medial: 6.7 LV vol d, MOD    41.9 ml A2C: LV vol d, MOD    52.5 ml A4C: LV vol s, MOD    11.8 ml A2C: LV vol s, MOD    15.3 ml A4C: LV SV MOD A2C:   30.1 ml LV SV MOD A4C:   52.5 ml LV SV MOD BP:    35.8 ml RIGHT VENTRICLE          IVC RV Basal diam:  2.85 cm  IVC diam: 1.83 cm TAPSE (M-mode): 1.7 cm LEFT ATRIUM             Index        RIGHT ATRIUM           Index LA diam:        3.36 cm 1.72 cm/m   RA Area:     12.90 cm LA Vol (A2C):   42.5 ml 21.77 ml/m  RA Volume:   32.90 ml  16.85 ml/m LA Vol (A4C):   38.6 ml 19.77 ml/m LA Biplane Vol: 43.7 ml 22.38 ml/m  AORTIC VALVE                    PULMONIC VALVE AV Area (Vmax):    2.62 cm     PV Vmax:       1.02 m/s AV Area (Vmean):   2.67 cm     PV Peak grad:  4.2 mmHg AV Area (VTI):     3.09 cm AV Vmax:  105.00 cm/s AV Vmean:          70.700 cm/s AV VTI:             0.166 m AV Peak Grad:      4.4 mmHg AV Mean Grad:      2.0 mmHg LVOT Vmax:         88.60 cm/s LVOT Vmean:        60.800 cm/s LVOT VTI:          0.165 m LVOT/AV VTI ratio: 0.99  AORTA Ao Root diam: 3.22 cm Ao Asc diam:  2.83 cm MV E velocity: 82.30 cm/s  TRICUSPID VALVE                            TR Peak grad:   41.0 mmHg                            TR Vmax:        320.00 cm/s                             SHUNTS                            Systemic VTI:  0.16 m                            Systemic Diam: 1.99 cm Joelle Cedars Tonleu Electronically signed by Joelle Cedars Ny Signature Date/Time: 06/06/2024/11:26:36 AM    Final    DG Chest Port 1 View Result Date: 06/04/2024 CLINICAL DATA:  Possible sepsis palpitation and cough EXAM: PORTABLE CHEST 1 VIEW COMPARISON:  08/21/2023 FINDINGS: Mild cardiomegaly. Streaky atelectasis or mild infiltrate left lung base. Possible trace left effusion. Aortic atherosclerosis. No pneumothorax IMPRESSION: Streaky atelectasis or mild infiltrate at the left lung base with possible trace left effusion. Electronically Signed   By: Luke Bun M.D.   On: 06/04/2024 19:27    Microbiology: Recent Results (from the past 240 hours)  Blood Culture (routine x 2)     Status: None (Preliminary result)   Collection Time: 06/04/24  7:20 PM   Specimen: BLOOD LEFT ARM  Result Value Ref Range Status   Specimen Description BLOOD LEFT ARM  Final   Special Requests   Final    BOTTLES DRAWN AEROBIC AND ANAEROBIC Blood Culture adequate volume   Culture   Final    NO GROWTH 4 DAYS Performed at [facility], [address] [place], [place] [address]    Report Status PENDING  Incomplete  Resp panel by RT-PCR (RSV, Flu A&B, Covid)     Status: None   Collection Time: 06/04/24  7:20 PM   Specimen: Nasal Swab  Result Value Ref Range Status   SARS Coronavirus 2 by RT PCR NEGATIVE NEGATIVE Final   Influenza A by PCR NEGATIVE NEGATIVE Final   Influenza B by PCR NEGATIVE  NEGATIVE Final    Comment: (NOTE) The Xpert Xpress SARS-CoV-2/FLU/RSV plus assay is intended as an aid in the diagnosis of influenza from Nasopharyngeal swab specimens and should not be used as a sole basis for treatment. Nasal washings and aspirates are unacceptable for Xpert Xpress SARS-CoV-2/FLU/RSV testing.  Fact Sheet for Patients: bloggercourse.com  Fact Sheet for Healthcare Providers: seriousbroker.it  This test is not yet approved or cleared by the [place]  FDA and has been authorized for detection and/or diagnosis of SARS-CoV-2 by FDA under an Emergency Use Authorization (EUA). This EUA will remain in effect (meaning this test can be used) for the duration of the COVID-19 declaration under Section 564(b)(1) of the Act, 21 U.S.C. section 360bbb-3(b)(1), unless the authorization is terminated or revoked.     Resp Syncytial Virus by PCR NEGATIVE NEGATIVE Final    Comment: (NOTE) Fact Sheet for Patients: bloggercourse.com  Fact Sheet for Healthcare Providers: seriousbroker.it  This test is not yet approved or cleared by the [place]  FDA and has been authorized for detection and/or diagnosis of SARS-CoV-2 by FDA under an Emergency Use Authorization (EUA). This EUA will remain in effect (meaning this test can be used) for the duration of the COVID-19 declaration under Section 564(b)(1) of the Act, 21 U.S.C. section 360bbb-3(b)(1), unless the authorization is terminated or revoked.  Performed at [facility], [address] [place], [place] [address]   Blood Culture (routine x 2)     Status: None (Preliminary result)   Collection Time: 06/04/24  7:55 PM   Specimen: BLOOD LEFT ARM  Result Value Ref Range Status   Specimen Description BLOOD LEFT ARM  Final   Special Requests   Final    BOTTLES DRAWN AEROBIC AND ANAEROBIC Blood Culture adequate volume    Culture   Final    NO GROWTH 4 DAYS Performed at [facility], [address], [place], [place] [address]    Report Status PENDING  Incomplete     Labs: CBC: Recent Labs  Lab 06/04/24 1920 06/05/24 0230 06/05/24 1049 06/08/24 0437  WBC 9.9 7.6 6.8 5.3  NEUTROABS 7.5  --   --   --   HGB 12.5 11.1* 11.1* 11.3*  HCT 39.0 35.6* 35.7* 35.8*  MCV 73.6* 74.8* 76.3* 75.4*  PLT 267 232 221 244   Basic Metabolic Panel: Recent Labs  Lab 06/04/24 1920 06/05/24 0230 06/05/24 1049 06/08/24 0437  NA 139  --  139 140  K 3.9  --  4.1 3.7  CL 103  --  107 107  CO2 22  --  22 23  GLUCOSE 87  --  97 92  BUN 12  --  10 13  CREATININE 1.23* 1.11* 1.06* 1.33*  CALCIUM 9.1  --  8.8* 8.8*  MG  --   --  2.0 2.2  PHOS  --   --  3.4 4.7*   Liver Function Tests: Recent Labs  Lab 06/04/24 1920  AST 21  ALT 11  ALKPHOS 107  BILITOT 0.6  PROT 7.4  ALBUMIN 4.0   No results for input(s): LIPASE, AMYLASE in the last 168 hours. No results for input(s): AMMONIA in the last 168 hours. Cardiac Enzymes: No results for input(s): CKTOTAL, CKMB, CKMBINDEX, TROPONINI in the last 168 hours. BNP (last 3 results) No results for input(s): BNP in the last 8760 hours. CBG: No results for input(s): GLUCAP in the last 168 hours.  Time spent: 35 minutes  Signed:  Elvan Sor  [facility] 06/08/2024 10:54 AM      [1]  Allergies Allergen Reactions   Enalapril Maleate     REACTION: swelling, headache   Sulfamethoxazole-Trimethoprim     Abdominal pain, redness eyes

## 2024-06-08 NOTE — Anesthesia Postprocedure Evaluation (Signed)
"   Anesthesia Post Note  Patient: Takerra Lupinacci  Procedure(s) Performed: TRANSESOPHAGEAL ECHOCARDIOGRAM CARDIOVERSION     Patient location during evaluation: PACU Anesthesia Type: General Level of consciousness: awake and alert, oriented and patient cooperative Pain management: pain level controlled Vital Signs Assessment: post-procedure vital signs reviewed and stable Respiratory status: spontaneous breathing, nonlabored ventilation and respiratory function stable Cardiovascular status: blood pressure returned to baseline and stable Postop Assessment: no apparent nausea or vomiting Anesthetic complications: no   There were no known notable events for this encounter.  Last Vitals:  Vitals:   06/07/24 2348 06/08/24 0416  BP: 117/76 116/84  Pulse: 63 65  Resp: 18 18  Temp: 36.8 C 36.8 C  SpO2: 99% 99%    Last Pain:  Vitals:   06/08/24 0416  TempSrc: Oral  PainSc:                  Almarie CHRISTELLA Marchi      "

## 2024-06-09 ENCOUNTER — Telehealth: Payer: Self-pay

## 2024-06-09 LAB — CULTURE, BLOOD (ROUTINE X 2)
Culture: NO GROWTH
Culture: NO GROWTH
Special Requests: ADEQUATE
Special Requests: ADEQUATE

## 2024-06-09 NOTE — Transitions of Care (Post Inpatient/ED Visit) (Signed)
 "  06/09/2024  Name: Dominique Johnston MRN: [id number] DOB: [birth year]  Today's TOC FU Call Status: Today's TOC FU Call Status:: Successful TOC FU Call Completed TOC FU Call Complete Date: 06/09/24  Patient's Name and Date of Birth confirmed. Name, DOB  Transition Care Management Follow-up Telephone Call Date of Discharge: 06/08/24 Discharge Facility: [facility]) Type of Discharge: Inpatient Admission Primary Inpatient Discharge Diagnosis:: A-fib, Pneumonia How have you been since you were released from the hospital?: Better Any questions or concerns?: No  Items Reviewed: Did you receive and understand the discharge instructions provided?: Yes Medications obtained,verified, and reconciled?: Yes (Medications Reviewed) Any new allergies since your discharge?: No Dietary orders reviewed?: Yes Type of Diet Ordered:: Cardiac Do you have support at home?: Yes People in Home [RPT]: child(ren), adult, grandchild(ren) Name of Support/Comfort Primary Source: Annabella Butte  Medications Reviewed Today: Medications Reviewed Today     Reviewed by Moises Reusing, RN (Case Manager) on 06/09/24 at 1449  Med List Status: <None>   Medication Order Taking? Sig Documenting Provider Last Dose Status Informant  albuterol  (PROVENTIL  HFA;VENTOLIN  HFA) 108 (90 Base) MCG/ACT inhaler [id number] No Inhale 2 puffs into the lungs every 6 (six) hours as needed. Rollene Almarie LABOR, MD Past Month Active Self  amoxicillin -clavulanate (AUGMENTIN ) 875-125 MG tablet [id number]  Take 1 tablet by mouth 2 (two) times daily for 4 days. Von Bellis, MD  Active   apixaban  (ELIQUIS ) 5 MG TABS tablet [id number]  Take 1 tablet (5 mg total) by mouth 2 (two) times daily. Von Bellis, MD  Active   Ascorbic Acid (VITAMIN C PO) [id number] No Take 1 tablet by mouth daily. [provider] Past Week Active Self  benzonatate  (TESSALON ) 100 MG capsule [id number] No Take 1 capsule (100 mg total) by mouth 3  (three) times daily as needed for cough. Gladis Elsie BROCKS, PA-C Unknown Active Self  buPROPion  (WELLBUTRIN  XL) 300 MG 24 hr tablet [id number] No TAKE 1 TABLET(300 MG) BY MOUTH DAILY  Patient taking differently: Take 300 mg by mouth at bedtime.   Alvia Corean CROME, FNP Past Week Active Self  diclofenac  Sodium (VOLTAREN ) 1 % GEL [id number] No Apply 4 g topically 4 (four) times daily. Tellis Laneta NOVAK, NP Unknown Active Self  dicyclomine  (BENTYL ) 20 MG tablet [id number] No TAKE 1 TABLET BY MOUTH FOUR TIMES DAILY, BEFORE MEALS AND AT BEDTIME  Patient taking differently: Take 20 mg by mouth daily.   Rollene Almarie LABOR, MD Past Week Active Self  Discontinued 10/12/12 0915 (Reorder)   diltiazem  (TIAZAC ) 120 MG 24 hr capsule [id number] No Take 1 capsule (120 mg total) by mouth daily. Rollene Almarie LABOR, MD Past Week Active Self  dorzolamide  (TRUSOPT ) 2 % ophthalmic solution [id number] No Place 1 drop into both eyes 2 (two) times daily. [provider] Past Week Active Self           Med Note [name]   Sat Jun 05, 2024 11:20 AM)    escitalopram  (LEXAPRO ) 10 MG tablet [id number] No Take 2 tablets (20 mg total) by mouth daily. Rollene Almarie LABOR, MD Past Week Active Self  fluticasone  (FLONASE ) 50 MCG/ACT nasal spray [id number] No Place 1 spray into both nostrils daily.  Patient taking differently: Place 1 spray into both nostrils daily as needed for allergies or rhinitis.   Arlyss Leita BRAVO, PA-C Unknown Active Self  guaiFENesin  (MUCINEX ) 600 MG 12 hr tablet [id number]  Take 1 tablet (600 mg total) by mouth 2 (two) times daily for  5 days. Von Bellis, MD  Active   guaiFENesin -dextromethorphan  [facility] DM) 100-10 MG/5ML syrup [id number]  Take 10 mLs by mouth every 6 (six) hours as needed for cough. Von Bellis, MD  Active   ipratropium (ATROVENT ) 0.06 % nasal spray [id number] No Place 2 sprays into both nostrils 3 (three) times daily as needed for rhinitis. [provider] Past Month Active Self  latanoprost  (XALATAN ) 0.005 % ophthalmic solution [id number] No Place 1 drop into both eyes daily. Norins, Michael E, MD Past Week Active Self  levocetirizine (XYZAL) 5 MG tablet [id number] No Take 2.5 mg by mouth every evening. [provider] Past Week Active Self  losartan  (COZAAR ) 100 MG tablet [id number] No Take 1 tablet (100 mg total) by mouth daily.  Patient taking differently: Take 100 mg by mouth at bedtime.   Rollene Almarie LABOR, MD Past Week Active Self  multivitamin [facility]) tablet [id number] No Take 1 tablet by mouth daily. [provider] Past Week Active Self  omeprazole  (PRILOSEC) 40 MG capsule [id number] No TAKE 1 CAPSULE(40 MG) BY MOUTH DAILY Rollene Almarie LABOR, MD Past Week Active Self  potassium chloride  (KLOR-CON ) 8 MEQ tablet [id number] No TAKE 1 TABLET(8 MEQ) BY MOUTH DAILY Rollene Almarie LABOR, MD Past Week Active Self  WIXELA INHUB 250-50 MCG/ACT AEPB [id number] No Inhale 1 puff into the lungs 2 (two) times daily. [provider] 06/04/2024 Active Self            Home Care and Equipment/Supplies: Were Home Health Services Ordered?: NA Any new equipment or medical supplies ordered?: NA  Functional Questionnaire: Do you need assistance with bathing/showering or dressing?: No Do you need assistance with meal preparation?: No Do you need assistance with eating?: No Do you have difficulty maintaining continence: No Do you need assistance with getting out of bed/getting out of a chair/moving?: No Do you have difficulty managing or taking your medications?: No  Follow up appointments reviewed: PCP Follow-up appointment confirmed?: Yes Date of PCP follow-up appointment?: 06/14/24 Follow-up Provider: Dr. Rollene Specialist [facility] Follow-up appointment confirmed?: Yes Date of Specialist follow-up appointment?: 06/15/24 Follow-Up Specialty Provider:: Joelle Ny, Cardiology Do you need transportation to your  follow-up appointment?: No Do you understand care options if your condition(s) worsen?: Yes-patient verbalized understanding  SDOH Interventions Today    Flowsheet Row Most Recent Value  SDOH Interventions   Food Insecurity Interventions Intervention Not Indicated  Housing Interventions Intervention Not Indicated  Transportation Interventions Intervention Not Indicated  Utilities Interventions Intervention Not Indicated    Goals Addressed             This Visit's Progress    VBCI Transitions of Care (TOC) Care Plan       Problems:  Recent Hospitalization for treatment of Atrial Fibrillation and Pneumonia Knowledge Deficit Related to A-Fib, Pneumonia and Hospital or ED Adm Risk of 88%  Goal:  Over the next 30 days, the patient will not experience hospital readmission  Interventions:   AFIB Interventions:   Counseled on increased risk of stroke due to Afib and benefits of anticoagulation for stroke prevention Reviewed importance of adherence to anticoagulant exactly as prescribed Counseled on bleeding risk associated with Eliquis  5mg  twice daily and importance of self-monitoring for signs/symptoms of bleeding Counseled on importance of regular laboratory monitoring as prescribed Counseled on seeking medical attention after a head injury or if there is blood in the urine/stool Attend Cardiology appointment with Dr. Ny on February 4th  Pneumonia Assess Respiratory Status Frequently Timely Antibiotics Monitor  labs - CBC Encourage Adequate hydration Encourage graded activity Early warning signs requiring prompt attention include increased SOB, Worsening cough,  High or persistent fever, new confusion  Patient Self Care Activities:  Attend all scheduled provider appointments Call pharmacy for medication refills 3-7 days in advance of running out of medications Call provider office for new concerns or questions  Notify RN Care Manager of [facility] call rescheduling  needs Participate in Transition of Care Program/Attend [facility] scheduled calls Perform all self care activities independently  Take medications as prescribed    Plan:  Telephone follow up appointment with care management team member scheduled for:  Thursday February 5th at 1:00pm        Medford Balboa, BSN, RN [place]  VBCI - Population Health RN Care Manager [phone]  "

## 2024-06-09 NOTE — Patient Instructions (Signed)
 Visit Information  Thank you for taking time to visit with me today. Please don't hesitate to contact me if I can be of assistance to you before our next scheduled telephone appointment.  Our next appointment is by telephone on Thursday February 5th at 1:00pm  Following is a copy of your care plan:   Goals Addressed             This Visit's Progress    VBCI Transitions of Care (TOC) Care Plan       Problems:  Recent Hospitalization for treatment of Atrial Fibrillation and Pneumonia Knowledge Deficit Related to A-Fib, Pneumonia and Hospital or ED Adm Risk of 88%  Goal:  Over the next 30 days, the patient will not experience hospital readmission  Interventions:   AFIB Interventions:   Counseled on increased risk of stroke due to Afib and benefits of anticoagulation for stroke prevention Reviewed importance of adherence to anticoagulant exactly as prescribed Counseled on bleeding risk associated with Eliquis  5mg  twice daily and importance of self-monitoring for signs/symptoms of bleeding Counseled on importance of regular laboratory monitoring as prescribed Counseled on seeking medical attention after a head injury or if there is blood in the urine/stool Attend Cardiology appointment with Dr. Donley on February 4th  Pneumonia Assess Respiratory Status Frequently Timely Antibiotics Monitor labs - CBC Encourage Adequate hydration Encourage graded activity Early warning signs requiring prompt attention include increased SOB, Worsening cough,  High or persistent fever, new confusion  Patient Self Care Activities:  Attend all scheduled provider appointments Call pharmacy for medication refills 3-7 days in advance of running out of medications Call provider office for new concerns or questions  Notify RN Care Manager of [facility] call rescheduling needs Participate in Transition of Care Program/Attend [facility] scheduled calls Perform all self care activities independently  Take medications  as prescribed    Plan:  Telephone follow up appointment with care management team member scheduled for:  Thursday February 5th at 1:00pm        Patient verbalizes understanding of instructions and care plan provided today and agrees to view in MyChart. Active MyChart status and patient understanding of how to access instructions and care plan via MyChart confirmed with patient.     The patient has been provided with contact information for the care management team and has been advised to call with any health related questions or concerns.   Please call the care guide team at [phone] if you need to cancel or reschedule your appointment.   Please call the Suicide and Crisis Lifeline: 988 call the [place]  National Suicide Prevention Lifeline: 603-673-8201 or TTY: 337-476-2396 TTY 878-102-6741) to talk to a trained counselor if you are experiencing a Mental Health or Behavioral Health Crisis or need someone to talk to.  Medford Balboa, BSN, RN [place]  VBCI - [employer] Health RN Care Manager [phone]

## 2024-06-11 ENCOUNTER — Ambulatory Visit: Attending: Cardiology

## 2024-06-11 VITALS — BP 170/72 | HR 67 | Ht 66.0 in | Wt 188.2 lb

## 2024-06-11 DIAGNOSIS — I480 Paroxysmal atrial fibrillation: Secondary | ICD-10-CM | POA: Diagnosis not present

## 2024-06-11 DIAGNOSIS — I10 Essential (primary) hypertension: Secondary | ICD-10-CM | POA: Diagnosis not present

## 2024-06-11 LAB — LIPID PANEL
Chol/HDL Ratio: 3.3 ratio (ref 0.0–4.4)
Cholesterol, Total: 154 mg/dL (ref 100–199)
HDL: 47 mg/dL
LDL Chol Calc (NIH): 92 mg/dL (ref 0–99)
Triglycerides: 78 mg/dL (ref 0–149)
VLDL Cholesterol Cal: 15 mg/dL (ref 5–40)

## 2024-06-11 NOTE — Patient Instructions (Signed)
 Medication Instructions:  NO CHANGES *If you need a refill on your cardiac medications before your next appointment, please call your pharmacy*  Lab Work: FASTING LIPID PANEL TODAY If you have labs (blood work) drawn today and your tests are completely normal, you will receive your results only by: MyChart Message (if you have MyChart) OR A paper copy in the mail If you have any lab test that is abnormal or we need to change your treatment, we will call you to review the results.  Testing/Procedures: Dr. Ren has ordered a CT coronary calcium score.   Test locations:  [facility] at [facility] [place]  [facility] at [facility]  This is $99 out of pocket.   Coronary CalciumScan A coronary calcium scan is an imaging test used to look for deposits of calcium and other fatty materials (plaques) in the inner lining of the blood vessels of the heart (coronary arteries). These deposits of calcium and plaques can partly clog and narrow the coronary arteries without producing any symptoms or warning signs. This puts a person at risk for a heart attack. This test can detect these deposits before symptoms develop. Tell a health care provider about: Any allergies you have. All medicines you are taking, including vitamins, herbs, eye drops, creams, and over-the-counter medicines. Any problems you or family members have had with anesthetic medicines. Any blood disorders you have. Any surgeries you have had. Any medical conditions you have. Whether you are pregnant or may be pregnant. What are the risks? Generally, this is a safe procedure. However, problems may occur, including: Harm to a pregnant woman and her unborn baby. This test involves the use of radiation. Radiation exposure can be dangerous to a pregnant woman and her unborn baby. If you are pregnant, you generally should not have this  procedure done. Slight increase in the risk of cancer. This is because of the radiation involved in the test. What happens before the procedure? No preparation is needed for this procedure. What happens during the procedure? You will undress and remove any jewelry around your neck or chest. You will put on a hospital gown. Sticky electrodes will be placed on your chest. The electrodes will be connected to an electrocardiogram (ECG) machine to record a tracing of the electrical activity of your heart. A CT scanner will take pictures of your heart. During this time, you will be asked to lie still and hold your breath for 2-3 seconds while a picture of your heart is being taken. The procedure may vary among health care providers and hospitals. What happens after the procedure? You can get dressed. You can return to your normal activities. It is up to you to get the results of your test. Ask your health care provider, or the department that is doing the test, when your results will be ready. Summary A coronary calcium scan is an imaging test used to look for deposits of calcium and other fatty materials (plaques) in the inner lining of the blood vessels of the heart (coronary arteries). Generally, this is a safe procedure. Tell your health care provider if you are pregnant or may be pregnant. No preparation is needed for this procedure. A CT scanner will take pictures of your heart. You can return to your normal activities after the scan is done. This information is not intended to replace advice given to you by your health care provider. Make sure you discuss  any questions you have with your health care provider. Document Released: 10/26/2007 Document Revised: 03/18/2016 Document Reviewed: 03/18/2016 Elsevier Interactive Patient Education  2017 [employer].   Follow-Up: At [facility], you and your health needs are our priority.  As part of our continuing mission to provide you with  exceptional heart care, our providers are all part of one team.  This team includes your primary Cardiologist (physician) and Advanced Practice Providers or APPs (Physician Assistants and Nurse Practitioners) who all work together to provide you with the care you need, when you need it.  Your next appointment:   6 month(s)  Provider:   Joelle Cedars, MD  Other Instructions CHECK BLOOD PRESSURE DAILY AND KEEP LOG OF READINGS.

## 2024-06-14 ENCOUNTER — Other Ambulatory Visit: Payer: Self-pay

## 2024-06-14 ENCOUNTER — Encounter (HOSPITAL_COMMUNITY): Payer: Self-pay

## 2024-06-14 ENCOUNTER — Emergency Department (HOSPITAL_COMMUNITY)
Admission: EM | Admit: 2024-06-14 | Discharge: 2024-06-14 | Disposition: A | Discharge status: Home / Self Care | Attending: Emergency Medicine | Admitting: Emergency Medicine

## 2024-06-14 DIAGNOSIS — Z7901 Long term (current) use of anticoagulants: Secondary | ICD-10-CM | POA: Insufficient documentation

## 2024-06-14 DIAGNOSIS — I10 Essential (primary) hypertension: Secondary | ICD-10-CM | POA: Insufficient documentation

## 2024-06-14 DIAGNOSIS — R040 Epistaxis: Secondary | ICD-10-CM | POA: Insufficient documentation

## 2024-06-14 DIAGNOSIS — I4891 Unspecified atrial fibrillation: Secondary | ICD-10-CM | POA: Insufficient documentation

## 2024-06-14 DIAGNOSIS — Z79899 Other long term (current) drug therapy: Secondary | ICD-10-CM | POA: Insufficient documentation

## 2024-06-14 LAB — PROTIME-INR
INR: 1.3 — ABNORMAL HIGH (ref 0.8–1.2)
Prothrombin Time: 16.9 s — ABNORMAL HIGH (ref 11.4–15.2)

## 2024-06-14 LAB — COMPREHENSIVE METABOLIC PANEL WITH GFR
ALT: 14 U/L (ref 0–44)
AST: 22 U/L (ref 15–41)
Albumin: 4.2 g/dL (ref 3.5–5.0)
Alkaline Phosphatase: 111 U/L (ref 38–126)
Anion gap: 11 (ref 5–15)
BUN: 9 mg/dL (ref 8–23)
CO2: 24 mmol/L (ref 22–32)
Calcium: 9 mg/dL (ref 8.9–10.3)
Chloride: 108 mmol/L (ref 98–111)
Creatinine, Ser: 1.03 mg/dL — ABNORMAL HIGH (ref 0.44–1.00)
GFR, Estimated: 55 mL/min — ABNORMAL LOW
Glucose, Bld: 136 mg/dL — ABNORMAL HIGH (ref 70–99)
Potassium: 3.8 mmol/L (ref 3.5–5.1)
Sodium: 143 mmol/L (ref 135–145)
Total Bilirubin: 0.4 mg/dL (ref 0.0–1.2)
Total Protein: 7.5 g/dL (ref 6.5–8.1)

## 2024-06-14 LAB — CBC
HCT: 40.8 % (ref 36.0–46.0)
Hemoglobin: 12.6 g/dL (ref 12.0–15.0)
MCH: 23.4 pg — ABNORMAL LOW (ref 26.0–34.0)
MCHC: 30.9 g/dL (ref 30.0–36.0)
MCV: 75.8 fL — ABNORMAL LOW (ref 80.0–100.0)
Platelets: 270 10*3/uL (ref 150–400)
RBC: 5.38 MIL/uL — ABNORMAL HIGH (ref 3.87–5.11)
RDW: 16.4 % — ABNORMAL HIGH (ref 11.5–15.5)
WBC: 4.4 10*3/uL (ref 4.0–10.5)
nRBC: 0 % (ref 0.0–0.2)

## 2024-06-14 LAB — TYPE AND SCREEN
ABO/RH(D): O POS
Antibody Screen: NEGATIVE

## 2024-06-14 LAB — ABO/RH: ABO/RH(D): O POS

## 2024-06-14 MED ORDER — OXYMETAZOLINE HCL 0.05 % NA SOLN
1.0000 | Freq: Once | NASAL | Status: AC
  Administered 2024-06-14: 1 via NASAL
  Filled 2024-06-14: qty 30

## 2024-06-14 MED ORDER — LIDOCAINE-EPINEPHRINE-TETRACAINE (LET) TOPICAL GEL
3.0000 mL | Freq: Once | TOPICAL | Status: AC
  Administered 2024-06-14: 3 mL via TOPICAL
  Filled 2024-06-14: qty 3

## 2024-06-14 MED ORDER — ACETAMINOPHEN 500 MG PO TABS
1000.0000 mg | ORAL_TABLET | Freq: Once | ORAL | Status: AC
  Administered 2024-06-14: 1000 mg via ORAL
  Filled 2024-06-14: qty 2

## 2024-06-14 NOTE — ED Triage Notes (Signed)
 Patient put on blood thinners recently and she had a nose bleed start this evening. Her nose has been bleeding for the past 30 minutes.   She does have a history of hypertension. She was put on eliquis  for a-fib.

## 2024-06-15 ENCOUNTER — Inpatient Hospital Stay: Admitting: Internal Medicine

## 2024-06-16 ENCOUNTER — Inpatient Hospital Stay: Admitting: Internal Medicine

## 2024-06-16 ENCOUNTER — Ambulatory Visit

## 2024-06-16 DIAGNOSIS — I10 Essential (primary) hypertension: Secondary | ICD-10-CM

## 2024-06-16 DIAGNOSIS — I4892 Unspecified atrial flutter: Secondary | ICD-10-CM

## 2024-06-16 DIAGNOSIS — I4891 Unspecified atrial fibrillation: Secondary | ICD-10-CM

## 2024-06-17 ENCOUNTER — Ambulatory Visit (INDEPENDENT_AMBULATORY_CARE_PROVIDER_SITE_OTHER): Admitting: Otolaryngology

## 2024-06-17 ENCOUNTER — Other Ambulatory Visit: Payer: Self-pay

## 2024-06-17 ENCOUNTER — Encounter (INDEPENDENT_AMBULATORY_CARE_PROVIDER_SITE_OTHER): Payer: Self-pay | Admitting: Otolaryngology

## 2024-06-17 VITALS — BP 145/76 | HR 74 | Ht 66.0 in | Wt 188.0 lb

## 2024-06-17 DIAGNOSIS — R040 Epistaxis: Secondary | ICD-10-CM

## 2024-06-17 NOTE — Progress Notes (Signed)
 Reason for Consult: Epistaxis Referring Physician: Dr. Rollene Neither Dominique Johnston is an 79 y.o. female.  HPI: History of epistaxis about 2 days ago.  She was started on Eliquis  for atrial fibrillation but now is off of that because she was converted.  She had left-sided bleeding.  A Rhino Rocket was placed.  She has had no further bleeding.  The [employer] is not causing her significant pain or pressure.  She has not had any significant nasal problems.  No previous significant epistaxis.  Past Medical History:  Diagnosis Date   Allergy    rhinitis   Anxiety    Asthma    Bouchard nodes (DJD hand)    Bronchitis    recurrent   Cataract    COPD (chronic obstructive pulmonary disease) (HCC)    Depression    DJD (degenerative joint disease) of knee    DJD (degenerative joint disease), lumbosacral    GERD (gastroesophageal reflux disease)    Glaucoma    Headache(784.0)    Headache(784.0)    Hyperlipidemia    Hypertension    IBS (irritable bowel syndrome)    Neuromuscular disorder (HCC)    Sinusitis, chronic     Past Surgical History:  Procedure Laterality Date   BREAST EXCISIONAL BIOPSY Left 1998   BREAST SURGERY     CARDIOVERSION N/A 06/07/2024   Procedure: CARDIOVERSION;  Surgeon: Ren Donley Joelle VEAR, MD;  Location: [facility] INVASIVE CV LAB;  Service: Cardiovascular;  Laterality: N/A;   CATARACT EXTRACTION Right    04/21/18   EYE SURGERY     FRACTURE SURGERY     ORIF ANKLE FRACTURE  '07   Left ankle   SEPTOPLASTY  2000   TONSILLECTOMY     TRANSESOPHAGEAL ECHOCARDIOGRAM (CATH LAB) N/A 06/07/2024   Procedure: TRANSESOPHAGEAL ECHOCARDIOGRAM;  Surgeon: Ren Donley Joelle VEAR, MD;  Location: [facility] INVASIVE CV LAB;  Service: Cardiovascular;  Laterality: N/A;   transnasal laryngoscopy  2005   TUBAL LIGATION  1977    Family History  Problem Relation Age of Onset   Arthritis Mother    Varicose Veins Mother    Hypertension Father    Coronary artery disease Father         MI/ Fatal   Heart disease Father    Alcohol abuse Paternal Uncle    Asthma Maternal Uncle    Stroke Paternal Uncle    Breast cancer Neg Hx    Diabetes Neg Hx    Colon cancer Neg Hx     Social History:  reports that she has never smoked. She has never used smokeless tobacco. She reports that she does not drink alcohol and does not use drugs.  Allergies: Allergies[1]   No results found for this or any previous visit (from the past 48 hours).  No results found.  ROS Blood pressure (!) 145/76, pulse 74, height 5' 6 (1.676 m), weight 188 lb (85.3 kg), peak flow 96 L/min. Physical Exam Constitutional:      Appearance: Normal appearance.  HENT:     Head: Normocephalic and atraumatic.     Right Ear: Tympanic membrane is without lesions and middle ear aerated, ear canal and external ear normal.     Left Ear: Tympanic membrane is without lesions and middle ear aerated, ear canal and external ear normal.     Nose: There is a Rhino Rocket in the left side with the balloon coming out and taped to the left face.  No bleeding.  No redness or  swelling.    Oral cavity/oropharynx: Mucous membranes are moist. No lesions or masses    Larynx: normal voice. Mirror attempted without success    Eyes:     Extraocular Movements: Extraocular movements intact.     Conjunctiva/sclera: Conjunctivae normal.     Pupils: Pupils are equal, round, and reactive to light.  Cardiovascular:     Rate and Rhythm: Normal rate.  Pulmonary:     Effort: Pulmonary effort is normal.  Musculoskeletal:     Cervical back: Normal range of motion and neck supple. No rigidity.  Lymphadenopathy:     Cervical: No cervical adenopathy or masses.salivary glands without lesions. .     Salivary glands- no mass or swelling Neurological:     Mental Status: He is alert. CN 2-12 intact. No nystagmus      Assessment/Plan: Epistaxis-it is only been 2 days since the balloon was placed.  I talked about the fact that usually would  like to let wait 5 days.  I gave her the option removing it early but she wants to keep it.  The daughter is very good with procedures and comfortable with removing it herself and I showed her how to release the air out of the balloon.  They will remove it on Sunday and if she has any further bleeding she will follow-up.  Dominique Johnston 06/17/2024, 12:37 PM        [1]  Allergies Allergen Reactions   Enalapril Maleate     REACTION: swelling, headache   Sulfamethoxazole-Trimethoprim     Abdominal pain, redness eyes

## 2024-06-17 NOTE — Transitions of Care (Post Inpatient/ED Visit) (Signed)
 " Transition of Care week 2  Visit Note  06/17/2024  Name: Dominique Johnston MRN: [id number]          DOB: [birth year]  Situation: Patient enrolled in [facility] 30-day program. Visit completed with [name] by telephone.   Background:   Initial Transition Care Management Follow-up Telephone Call Discharge Date and Diagnosis: 06/08/24, A-fib, Pneumonia   Past Medical History:  Diagnosis Date   Allergy    rhinitis   Anxiety    Asthma    Bouchard nodes (DJD hand)    Bronchitis    recurrent   Cataract    COPD (chronic obstructive pulmonary disease) (HCC)    Depression    DJD (degenerative joint disease) of knee    DJD (degenerative joint disease), lumbosacral    GERD (gastroesophageal reflux disease)    Glaucoma    Headache(784.0)    Headache(784.0)    Hyperlipidemia    Hypertension    IBS (irritable bowel syndrome)    Neuromuscular disorder (HCC)    Sinusitis, chronic     Assessment: Patient Reported Symptoms: Cognitive Cognitive Status: Alert and oriented to person, place, and time, Normal speech and language skills      Neurological Neurological Review of Symptoms: Not assessed    HEENT HEENT Symptoms Reported: Other: (Nosebleed to the left nare that required and ED visit) HEENT Management Strategies: Routine screening, Medical device HEENT Comment: The patient has a Rhino Rocket in the left nostril    Cardiovascular Cardiovascular Symptoms Reported: No symptoms reported Does patient have uncontrolled Hypertension?: No Cardiovascular Management Strategies: Medication therapy, Routine screening, Coping strategies, Medical device Cardiovascular Comment: The patient is off Eliquis  and she states she will not take it again due to nosebleeds. She states she is going to start monitoring her blood pressure more oftern  Respiratory Respiratory Symptoms Reported: Productive cough Additional Respiratory Details: The patient continues with coughing up mucus. she feels  that she may need another round of antibiotics. Respiratory Management Strategies: Activity, Routine screening, Medication therapy  Endocrine Endocrine Symptoms Reported: Not assessed    Gastrointestinal Gastrointestinal Symptoms Reported: Not assessed      Genitourinary Genitourinary Symptoms Reported: Not assessed    Integumentary Integumentary Symptoms Reported: Not assessed    Musculoskeletal Musculoskelatal Symptoms Reviewed: No symptoms reported        Psychosocial Psychosocial Symptoms Reported: No symptoms reported         There were no vitals filed for this visit. Pain Score: 4  Pain Type: Acute pain Pain Location: Nose Pain Descriptors / Indicators: Sore Pain Onset: On-going Patients Stated Pain Goal: 0 Pain Intervention(s): Rest  Medications Reviewed Today     Reviewed by Moises Reusing, RN (Case Manager) on 06/17/24 at 1309  Med List Status: <None>   Medication Order Taking? Sig Documenting Provider Last Dose Status Informant  albuterol  (PROVENTIL  HFA;VENTOLIN  HFA) 108 (90 Base) MCG/ACT inhaler [id number]  Inhale 2 puffs into the lungs every 6 (six) hours as needed. Rollene Almarie LABOR, MD  Active Self  apixaban  (ELIQUIS ) 5 MG TABS tablet [id number]  Take 1 tablet (5 mg total) by mouth 2 (two) times daily. Von Bellis, MD  Active   Ascorbic Acid (VITAMIN C PO) [id number]  Take 1 tablet by mouth daily. [provider]  Active Self  benzonatate  (TESSALON ) 100 MG capsule [id number]  Take 1 capsule (100 mg total) by mouth 3 (three) times daily as needed for cough. Gladis Elsie BROCKS, PA-C  Active Self  buPROPion  (WELLBUTRIN  XL) 300 MG  24 hr tablet [id number]  TAKE 1 TABLET(300 MG) BY MOUTH DAILY  Patient taking differently: Take 300 mg by mouth at bedtime.   Alvia Corean CROME, FNP  Active Self  diclofenac  Sodium (VOLTAREN ) 1 % GEL [id number]  Apply 4 g topically 4 (four) times daily. Tellis Laneta NOVAK, NP  Active Self  dicyclomine  (BENTYL ) 20 MG tablet  [id number]  TAKE 1 TABLET BY MOUTH FOUR TIMES DAILY, BEFORE MEALS AND AT BEDTIME  Patient taking differently: Take 20 mg by mouth daily.   Rollene Almarie LABOR, MD  Active Self    Discontinued 10/12/12 0915 (Reorder)   diltiazem  (TIAZAC ) 120 MG 24 hr capsule [id number]  Take 1 capsule (120 mg total) by mouth daily. Rollene Almarie LABOR, MD  Active Self  dorzolamide  (TRUSOPT ) 2 % ophthalmic solution [id number]  Place 1 drop into both eyes 2 (two) times daily. [provider]  Active Self           Med Note [name]   Sat Jun 05, 2024 11:20 AM)    escitalopram  (LEXAPRO ) 10 MG tablet [id number]  Take 2 tablets (20 mg total) by mouth daily. Rollene Almarie LABOR, MD  Active Self  fluticasone  (FLONASE ) 50 MCG/ACT nasal spray [id number]  Place 1 spray into both nostrils daily.  Patient taking differently: Place 1 spray into both nostrils daily as needed for allergies or rhinitis.   Graham, Laura E, PA-C  Active Self  guaiFENesin -dextromethorphan  (ROBITUSSIN DM) 100-10 MG/5ML syrup [id number]  Take 10 mLs by mouth every 6 (six) hours as needed for cough. Von Bellis, MD  Active   ipratropium (ATROVENT ) 0.06 % nasal spray [id number]  Place 2 sprays into both nostrils 3 (three) times daily as needed for rhinitis. [provider]  Active Self  latanoprost  (XALATAN ) 0.005 % ophthalmic solution [id number]  Place 1 drop into both eyes daily. Norins, Michael E, MD  Active Self  levocetirizine (XYZAL) 5 MG tablet [id number]  Take 2.5 mg by mouth every evening. [provider]  Active Self  losartan  (COZAAR ) 100 MG tablet [id number]  Take 1 tablet (100 mg total) by mouth daily.  Patient taking differently: Take 100 mg by mouth at bedtime.   Rollene Almarie LABOR, MD  Active Self  multivitamin [facility]) tablet [id number]  Take 1 tablet by mouth daily. [provider]  Active Self  omeprazole  (PRILOSEC) 40 MG capsule [id number]  TAKE 1 CAPSULE(40 MG) BY MOUTH DAILY Rollene Almarie LABOR, MD  Active Self  potassium chloride  (KLOR-CON ) 8 MEQ tablet [id number]  TAKE 1 TABLET(8 MEQ) BY MOUTH DAILY Rollene Almarie LABOR, MD  Active Self  WIXELA INHUB 250-50 MCG/ACT AEPB [id number]  Inhale 1 puff into the lungs 2 (two) times daily. [provider]  Active Self            Recommendation:   Continue Current Plan of Care  Follow Up Plan:   Telephone follow-up in 1 week  Medford Balboa, BSN, RN [place]  VBCI - [facility] RN Care Manager [phone]     "

## 2024-06-17 NOTE — Patient Instructions (Signed)
 Visit Information  Thank you for taking time to visit with me today. Please don't hesitate to contact me if I can be of assistance to you before our next scheduled telephone appointment.  Our next appointment is by telephone on Thursday February 12th at 1:00pm  Following is a copy of your care plan:   Goals Addressed             This Visit's Progress    VBCI Transitions of Care (TOC) Care Plan       Problems: (reviewed 06/17/24) Recent Hospitalization for treatment of Atrial Fibrillation and Pneumonia Knowledge Deficit Related to A-Fib, Pneumonia and Hospital or ED Adm Risk of 88%  Goal: (reviewed 06/17/24) Over the next 30 days, the patient will not experience hospital readmission  Interventions: (reviewed 06/17/24)  AFIB Interventions:   Counseled on increased risk of stroke due to Afib and benefits of anticoagulation for stroke prevention Reviewed importance of adherence to anticoagulant exactly as prescribed Counseled on bleeding risk associated with Eliquis  5mg  twice daily and importance of self-monitoring for signs/symptoms of bleeding Counseled on importance of regular laboratory monitoring as prescribed Counseled on seeking medical attention after a head injury or if there is blood in the urine/stool Attend Cardiology appointment with Dr. Donley on February 4th - 06/17/24 - Cancelled and will need to reschedule 06/17/24 - The patient went to the ED on 06/14/24 for Epistaxsis. She has a [employer] in place in the left nostril 06/17/24 - Went to ENT today and she needs to leave the [employer] in place until 06/19/24 Stop Eliquis    Pneumonia (reviewed 06/17/24) Assess Respiratory Status Frequently Timely Antibiotics Monitor labs - CBC Encourage Adequate hydration Encourage graded activity Early warning signs requiring prompt attention include increased SOB, Worsening cough,  High or persistent fever, new confusion 06/17/24 - Takes the last Abx today. The patient is still having  cough  Patient Self Care Activities: (reviewed 06/17/24) Attend all scheduled provider appointments Call pharmacy for medication refills 3-7 days in advance of running out of medications Call provider office for new concerns or questions  Notify RN Care Manager of [facility] call rescheduling needs Participate in Transition of Care Program/Attend [facility] scheduled calls Perform all self care activities independently  Take medications as prescribed   06/17/24 - Hospital F/U with PCP on 06/18/24  Plan:  Telephone follow up appointment with care management team member scheduled for:  Thursday February 12th at 1:00pm        Patient verbalizes understanding of instructions and care plan provided today and agrees to view in MyChart. Active MyChart status and patient understanding of how to access instructions and care plan via MyChart confirmed with patient.     The patient has been provided with contact information for the care management team and has been advised to call with any health related questions or concerns.   Please call the care guide team at [phone] if you need to cancel or reschedule your appointment.   Please call the Suicide and Crisis Lifeline: 988 call the [place]  National Suicide Prevention Lifeline: 315 514 2570 or TTY: (407) 262-0879 TTY 509-009-7951) to talk to a trained counselor if you are experiencing a Mental Health or Behavioral Health Crisis or need someone to talk to.  Medford Balboa, BSN, RN [place]  VBCI - [employer] [facility] RN Care Manager [phone]

## 2024-06-18 ENCOUNTER — Other Ambulatory Visit

## 2024-06-18 ENCOUNTER — Ambulatory Visit: Admitting: Internal Medicine

## 2024-06-18 ENCOUNTER — Inpatient Hospital Stay: Admitting: Internal Medicine

## 2024-06-18 ENCOUNTER — Encounter: Payer: Self-pay | Admitting: Internal Medicine

## 2024-06-18 VITALS — BP 138/80 | HR 71 | Temp 97.7°F | Ht 66.0 in | Wt 187.0 lb

## 2024-06-18 DIAGNOSIS — J189 Pneumonia, unspecified organism: Secondary | ICD-10-CM

## 2024-06-18 DIAGNOSIS — R040 Epistaxis: Secondary | ICD-10-CM

## 2024-06-18 DIAGNOSIS — I4891 Unspecified atrial fibrillation: Secondary | ICD-10-CM

## 2024-06-18 MED ORDER — TRIAMCINOLONE ACETONIDE 0.1 % EX CREA: 1.0000 | TOPICAL_CREAM | Freq: Two times a day (BID) | CUTANEOUS | 0 refills | Status: AC

## 2024-06-18 MED ORDER — AMOXICILLIN-POT CLAVULANATE 875-125 MG PO TABS: 1.0000 | ORAL_TABLET | Freq: Two times a day (BID) | ORAL | 0 refills | Status: AC

## 2024-06-18 MED ORDER — DOXYCYCLINE HYCLATE 100 MG PO TABS: 100.0000 mg | ORAL_TABLET | Freq: Two times a day (BID) | ORAL | 0 refills | Status: DC

## 2024-06-18 NOTE — Patient Instructions (Addendum)
 We have sent in augmentin to take 1 pill twice a day for 1 week.

## 2024-06-18 NOTE — Progress Notes (Signed)
 "  Subjective:   Patient ID: Dominique Johnston, female    DOB: [birth year], 79 y.o.   MRN: [id number]  Discussed the use of AI scribe software for clinical note transcription with the patient, who gave verbal consent to proceed.  History of Present Illness Dominique Johnston is a 79 year old female with recent hospitalization for new atrial fibrillation in the setting of CAP who presents with a also an ER visit related to severe nosebleed.  She was recently hospitalized for pneumonia and atrial fibrillation. Initially, she sought care at urgent care due to worsening cough, increased mucus production, fatigue, fever, and shortness of breath. At urgent care, she was found to have an elevated heart rate and underwent an EKG, after which she was sent to the hospital. During her hospital stay, she underwent cardioversion and was started on Eliquis .  After being discharged, she experienced a severe epistaxis at home, which she attributes to the use of Eliquis . The bleeding was significant, described as 'running like a faucet,' and required emergency care where a rhino rocket was used to stop the bleeding. She continues to experience minor bleeding and pressure-related symptoms, including 'teardrops of blood.'  She reports ongoing cough with mucus production, which she describes as 'yucky,' and is concerned about the possibility of persistent pneumonia. She was previously treated with doxycycline , which provided temporary relief, but symptoms worsened thereafter. She has asthma and has several lung infections and still feels she has infection  She is currently taking Eliquis  twice daily but stopped after the nosebleed and uses Tylenol  for discomfort related to the epistaxis treatment. She also mentions experiencing irritation from antibiotics and inquires about a cream to alleviate this.  No chest pain or palpitations. She notes that her symptoms may have been exacerbated by her recent illness and  stress on her lungs.  PMH, [facility], social history reviewed and updated  MEdication reconciliation done during visit  Review of Systems  Constitutional:  Positive for activity change and appetite change.  HENT:  Positive for nosebleeds.   Eyes: Negative.   Respiratory:  Negative for cough, chest tightness and shortness of breath.   Cardiovascular:  Negative for chest pain, palpitations and leg swelling.  Gastrointestinal:  Negative for abdominal distention, abdominal pain, constipation, diarrhea, nausea and vomiting.  Musculoskeletal: Negative.   Skin: Negative.   Neurological: Negative.   Psychiatric/Behavioral: Negative.      Objective:  Physical Exam Constitutional:      Appearance: She is well-developed.  HENT:     Head: Normocephalic and atraumatic.     Ears:     Comments: Rhino rocket left notril in place Cardiovascular:     Rate and Rhythm: Normal rate and regular rhythm.  Pulmonary:     Effort: Pulmonary effort is normal. No respiratory distress.     Breath sounds: Normal breath sounds. No wheezing or rales.  Abdominal:     General: Bowel sounds are normal. There is no distension.     Palpations: Abdomen is soft.     Tenderness: There is no abdominal tenderness.  Musculoskeletal:     Cervical back: Normal range of motion.  Skin:    General: Skin is warm and dry.  Neurological:     Mental Status: She is alert and oriented to person, place, and time.     Coordination: Coordination normal.     Vitals:   06/18/24 1053 06/18/24 1059  BP: (!) 140/90 138/80  Pulse: 71   Temp: 97.7 F (36.5 C)  TempSrc: Oral   SpO2: 94%   Weight: 187 lb (84.8 kg)   Height: 5' 6 (1.676 m)     Assessment and Plan Assessment & Plan CAP, LLL   Persistent cough and mucus production post-hospitalization suggest incomplete resolution. Prescribed Augmentin  for 1 week. Follow-up CT cardiac scan on March 4th which will assess lungs.   Atrial fibrillation   New onset likely due to  stress and hypertension, managed with cardioversion and Eliquis . Monitor heart rate and blood pressure at home. Continue Eliquis  and diltiazem . Sounds regular on exam today.   Epistaxis due to anticoagulation   Severe epistaxis post-Eliquis  initiation, managed with nasal packing. Continue nasal packing until Sunday. Use Tylenol  for discomfort as needed.   "

## 2024-06-22 ENCOUNTER — Other Ambulatory Visit

## 2024-06-24 ENCOUNTER — Telehealth

## 2024-06-28 ENCOUNTER — Ambulatory Visit: Payer: PPO

## 2024-06-29 ENCOUNTER — Ambulatory Visit

## 2024-07-14 ENCOUNTER — Other Ambulatory Visit (HOSPITAL_COMMUNITY)
